# Patient Record
Sex: Male | Born: 1955
Health system: Southern US, Community
[De-identification: ages and names within clinical notes are randomized; demographics above are authoritative.]

## PROBLEM LIST (undated history)

## (undated) DIAGNOSIS — E785 Hyperlipidemia, unspecified: Secondary | ICD-10-CM

## (undated) DIAGNOSIS — K219 Gastro-esophageal reflux disease without esophagitis: Secondary | ICD-10-CM

## (undated) DIAGNOSIS — G4733 Obstructive sleep apnea (adult) (pediatric): Secondary | ICD-10-CM

## (undated) DIAGNOSIS — I10 Essential (primary) hypertension: Secondary | ICD-10-CM

## (undated) DIAGNOSIS — I2510 Atherosclerotic heart disease of native coronary artery without angina pectoris: Secondary | ICD-10-CM

## (undated) DIAGNOSIS — E669 Obesity, unspecified: Secondary | ICD-10-CM

## (undated) DIAGNOSIS — E1169 Type 2 diabetes mellitus with other specified complication: Secondary | ICD-10-CM

## (undated) DIAGNOSIS — M1990 Unspecified osteoarthritis, unspecified site: Secondary | ICD-10-CM

## (undated) DIAGNOSIS — Z9989 Dependence on other enabling machines and devices: Secondary | ICD-10-CM

## (undated) DIAGNOSIS — E119 Type 2 diabetes mellitus without complications: Secondary | ICD-10-CM

## (undated) HISTORY — DX: Essential (primary) hypertension: I10

## (undated) HISTORY — DX: Morbid (severe) obesity due to excess calories: E66.01

## (undated) HISTORY — DX: Atherosclerotic heart disease of native coronary artery without angina pectoris: I25.10

## (undated) HISTORY — DX: Hyperlipidemia, unspecified: E78.5

## (undated) HISTORY — PX: TONSILLECTOMY: SUR1361

## (undated) HISTORY — DX: Type 2 diabetes mellitus without complications: E11.9

## (undated) HISTORY — DX: Obesity, unspecified: E11.69

## (undated) HISTORY — PX: UVULOPALATOPHARYNGOPLASTY: SHX827

## (undated) HISTORY — PX: KNEE ARTHROSCOPY: SHX127

## (undated) HISTORY — PX: JOINT REPLACEMENT: SHX530

## (undated) HISTORY — DX: Obesity, unspecified: E66.9

---

## 1995-04-15 HISTORY — PX: CORONARY ANGIOPLASTY WITH STENT PLACEMENT: SHX49

## 2002-08-15 ENCOUNTER — Encounter: Payer: Self-pay | Admitting: Gastroenterology

## 2002-08-15 ENCOUNTER — Encounter: Admission: RE | Admit: 2002-08-15 | Discharge: 2002-08-15 | Payer: Self-pay | Admitting: Gastroenterology

## 2002-09-13 ENCOUNTER — Encounter: Payer: Self-pay | Admitting: Vascular Surgery

## 2002-09-13 ENCOUNTER — Encounter: Admission: RE | Admit: 2002-09-13 | Discharge: 2002-09-13 | Payer: Self-pay | Admitting: Vascular Surgery

## 2003-07-25 ENCOUNTER — Encounter: Admission: RE | Admit: 2003-07-25 | Discharge: 2003-09-04 | Payer: Self-pay | Admitting: Neurology

## 2006-04-14 HISTORY — PX: CARDIAC CATHETERIZATION: SHX172

## 2007-03-03 ENCOUNTER — Observation Stay (HOSPITAL_COMMUNITY): Admission: AD | Admit: 2007-03-03 | Discharge: 2007-03-05 | Payer: Self-pay | Admitting: Interventional Cardiology

## 2007-03-18 ENCOUNTER — Encounter (HOSPITAL_COMMUNITY): Admission: RE | Admit: 2007-03-18 | Discharge: 2007-04-14 | Payer: Self-pay | Admitting: Interventional Cardiology

## 2007-04-15 ENCOUNTER — Encounter (HOSPITAL_COMMUNITY): Admission: RE | Admit: 2007-04-15 | Discharge: 2007-07-14 | Payer: Self-pay | Admitting: Interventional Cardiology

## 2008-08-16 ENCOUNTER — Encounter: Payer: Self-pay | Admitting: Cardiovascular Disease

## 2008-08-16 ENCOUNTER — Ambulatory Visit: Payer: Self-pay

## 2010-08-27 NOTE — Discharge Summary (Signed)
[name], [name]                ACCOUNT NO.:  [id number]   MEDICAL RECORD NO.:  [id number]          PATIENT TYPE:  INP   LOCATION:  6525                         FACILITY:  [facility]   PHYSICIAN:  Lyn Records, M.D.   DATE OF BIRTH:  [birth year]   DATE OF ADMISSION:  03/03/2007  DATE OF DISCHARGE:  03/05/2007                               DISCHARGE SUMMARY   DISCHARGE DIAGNOSES:  1. Class III angina, resolved.  2. Coronary artery disease, status cardiac catheterization as      described below.  3. Long-term medication use.  4. Hyperlipidemia.  5. Hypertension.  6. Gastroesophageal reflux disease.   HOSPITAL COURSE:  [name] is a 55 year old male patient who has known  coronary artery disease, underwent stenting to the right coronary artery  in 1997.  Recently, he has complained of burning in the chest,  associated with shortness of breath with exertion.  He was placed on a  treadmill, and within 2 minutes he had chest pain associated with  shortness of breath but no EKG changes.  Due to the early positive  stress test and frequency of pain, he is admitted for cardiac  catheterization.   He underwent cardiac catheterization the following day and was found to  have the following:  Normal LV:  Left main had distal 30% lesion.  LAD had an eccentric 30% ostial with a 40% proximal stenosis.  Circumflex was 40% proximal with a 95% and a small branch to the OM.  Right coronary artery was occluded proximally with left-to-right  collaterals.  Dr. Katrinka Blazing then performed a percutaneous intervention to  the right coronary artery, but was unable to cross.  This time, we are  initiating maximal medical therapy.   On discharge, the patient's creatinine was 0.88, potassium 4.1.  Right  groin was stable.  Vital signs were stable.   DISCHARGE MEDICATIONS:  1. Enteric-coated aspirin 325 mg daily.  2. Lipitor 80 mg a day.  3. Toprol XL 100 mg a day.  4. Welchol 3 tablets daily.  5. Nexium 40  mg a day.  6. Lyrica 75 mg a day.  7. Triamterine/HCTZ  37.5 mg daily.  8. Voltaren 75 mg daily.  9. Imdur 60 mg a day.  10.Sublingual nitroglycerin p.r.n. chest pain.   DISCHARGE INSTRUCTIONS:  Remain on low-sodium heart-healthy diet.  No  lifting over 10 pounds for 1 week.  No driving for 2 days.  Clean cath  site gently with soap and water.  No scrubbing.  Follow up with the  nurse practitioner, Tammy Lewis/Dr. Katrinka Blazing on March 18, 2007 at 2:30  p.m.      Guy Franco, P.A.      Lyn Records, M.D.  Electronically Signed    LB/MEDQ  D:  03/05/2007  T:  03/05/2007  Job:  [id number]

## 2010-08-27 NOTE — Cardiovascular Report (Signed)
NAMEEMBER, GOTTWALD NO.:  [id number]   MEDICAL RECORD NO.:  [id number]          PATIENT TYPE:  INP   LOCATION:  2807                         FACILITY:  [facility]   PHYSICIAN:  Lyn Records, M.D.   DATE OF BIRTH:  [birth year]   DATE OF PROCEDURE:  03/04/2007  DATE OF DISCHARGE:                            CARDIAC CATHETERIZATION   PRIMARY CARE PHYSICIAN:  Tasia Catchings, MD   INDICATION:  Class III angina in this patient with a prior history of  right coronary artery stent greater than 10 years ago.  Recent early  positive exercise treadmill test on March 03, 2007.   PROCEDURE PERFORMED:  1. Left heart cath.  2. Selective coronary angiogram.  3. Left ventriculorrhaphy.  4. PCI of the totally occluded right coronary.   DESCRIPTION:  After informed consent, the patient was brought to the  cardiac catheterization laboratory.  A 6-French sheath was placed in the  right femoral artery using the modified Seldinger technique.  A 6-French  A2 multipurpose catheter was used for hemodynamic recordings, left  ventriculorrhaphy by HAM injection, and selective coronary angiography.  The new significant finding was total occlusion of the right coronary in  the mid segment that had been previously stented.  There was significant  left-to-right collaterals.  The right coronary is a very large dominant  vessel.   After reviewing the digital angiogram we placed, it was felt that an  attempted recanalization of the right coronary with percutaneous  technique was warranted.  We gave the patient an Angiomax bolus followed  by an infusion.  ACT was documented to be greater than 300.  We then  used a 15-mm long over the wire Maverick balloon and loaded an  intermediate ASAHI wire within this balloon catheter.  We advanced the  catheter into the guide system and then the wire into the right coronary  stump.  We then advanced the balloon catheter over the wire into the  proximal  RCA.  From this vantage point, we used this wire to attempt to  cross total occlusion.  We were able to penetrate half way through the  obstruction and spent 30-40 minutes attempting to get through to the  distal vessel, but were unsuccessful.  In the mid-vessel, we would  selectively engage small side branches that would take [place] off course.  After 30-35 minutes of attempting to cross with the wire, we terminated  the case.  Hemostasis was achieved with manual compression.   RESULTS:  1. Hemodynamic data:      a.     Aortic pressure 112/70.      b.     Left ventricular pressure 115/9.  2. Left ventriculorrhaphy:  The Left ventricular cavity size and      function are normal.  EF was 70%.  No mitral regurgitation is      noted.  3. Coronary angiography.      a.     Left main coronary:  The distal left main contains less than       25% stenosis.      b.  Left anterior descending coronary:  The LAD is a large       vessel that reaches the left ventricular apex and gives origin to       a large diagonal.  There is eccentric 20% to 30% osteo LAD       narrowing, segmental 30% to 40% narrowing in the proximal LAD.  No       high-grade obstruction is present.      c.     Circumflex artery:  Circumflex artery gives origin to a       large branching first obtuse marginal that arises after the first       2-3 mm of the circumflex.  There is eccentric 30% to 40% narrowing       before the first obtuse marginal.  Circumflex beyond the first       obtuse marginal was small and the continuation of the circumflex       after the marginal contains an 80% to 90% obstruction, and the       obtuse marginal beyond this is a small branching vessel less than       1.5 mm in diameter.      d.     Right coronary:  The right coronary artery is totally       occluded in the mid vessel.  This was within the previously       stented segment.  The distal right coronary is filled by LAD and       circumflex,  collaterals to the distal right coronary and the PDA.       There is a retrograde flow of contrast into the mid right coronary       noted on long cine run.  4. PCI:  We were unable to cross the total occlusion in the mid RCA      restenotic stent.  A 30-40 minutes was spent attempting to cross      into the distal RCA without success.   CONCLUSIONS:  1. Severe coronary artery disease with total occlusion of the right      coronary artery and left-to-right collaterals.  There is      significant stenosis and small continuation of the circumflex      beyond the first obtuse marginal.  The first obtuse marginal, LAD,      and left main are patent.  2. Normal left ventricular function.   PLAN:  Aggressively titrate medications and attempt to control the  patient's symptoms with medical therapy.  If this fails, consideration  of repeat attempted PCI or coronary artery bypass grafting should be  entertained.      Lyn Records, M.D.  Electronically Signed     HWS/MEDQ  D:  03/04/2007  T:  03/04/2007  Job:  [id number]   cc:   Tasia Catchings, M.D.

## 2010-08-27 NOTE — H&P (Signed)
NAMEDANIELA, HERNAN NO.:  [id number]   MEDICAL RECORD NO.:  [id number]          PATIENT TYPE:  INP   LOCATION:  2009                         FACILITY:  [facility]   PHYSICIAN:  Lyn Records, M.D.   DATE OF BIRTH:  1955-10-24   DATE OF ADMISSION:  03/03/2007  DATE OF DISCHARGE:                              HISTORY & PHYSICAL   ADMIT HISTORY AND PHYSICAL   CHIEF COMPLAINT:  Chest pain.   HISTORY OF PRESENT ILLNESS:  [name] is a pleasant 55 year old  white male patient of Dr. Verdis Prime, with known coronary artery  disease, status post stenting of the RCA in June of 1997.  On a recent  visit to the office, the patient complained of burning in the chest with  associated dyspnea.  The pain was exertionally related, does not  radiate, and resolves with rest.  His symptoms prompted a stress  Cardiolite on the day of admission.  He developed within 2 minutes on  the treadmill the same chest discomfort with associated dyspnea.  There  were no EKG changes.  His pain resolved with rest.  In talking with him  further today, he states that this exertional discomfort has been worse  in the last week, and occurs just about every time he exerts himself.  He has had no palpitations or lightheadedness.  Also, he has had no  sweats or nausea.  Due to the frequency of discomfort and the early  positive stress test, he is being admitted for cardiac catheterization  in the morning.   REVIEW OF SYSTEMS:  GENERAL:  No recent weight loss or gain.  No recent  upper respiratory infection.  RESPIRATORY:  No cough, sputum production,  hemoptysis or hoarseness.  CARDIOVASCULAR:  As above.  Denies any  pleuritic nature of the discomfort.  ABDOMEN:  No dysphagia, heartburn,  abdominal pain, nausea, vomiting or diarrhea.  Denies constipation,  melena, hematochezia or hemorrhoids.  GU:  No dysuria, frequency,  hematuria, nocturia.  HEMATOLOGIC:  No bleeding or bruising tendency or  history, frequent anemia.  MUSCULOSKELETAL:  Denies joint pain,  stiffness, swelling or deformity.  NEUROLOGIC:  No numbness, tingling,  seizures, memory loss, vertigo or tremors.   PAST MEDICAL HISTORY:  1. ASHD, status post stenting of RCA, June 1997.  2. Hypercholesterolemia.  3. Hypertension.  4. GERD.  5. Sleep apnea.  6. Obesity.  7. Peripheral neuropathy.  8. Strong family history of heart disease.   PAST SURGICAL HISTORY:  1. Uvulectomy.  2. Left knee meniscal surgery.   CURRENT MEDICATIONS:  1. Toprol-XL 50 mg daily.  2. Lipitor 80 mg daily.  3. Aspirin 325 mg daily.  4. Welchol 3 tablets h.s. daily.  5. Triamterene 75 a half a tablet daily.  6. Lyrica 75 mg 2 daily.  7. Multivitamin daily.  8. CPAP at bedtime daily.  9. Omeprazole 20 mg daily.  10.Nitroglycerin 0.4 mg sublingual p.r.n.   DRUG ALLERGIES:  PENICILLIN.   SOCIAL HISTORY:  Married.  Wife has MS.  The patient does not smoke.  No  alcohol.  No  illicit drug use.   FAMILY HISTORY:  Father died age 55 with CHF.  Also had coronary artery  disease and his first MI at age 64.  Mother is alive at age 33 with COPD  on O2.  He has no siblings.   PHYSICAL EXAMINATION:  VITAL SIGNS:  Weight 297.2, pulse 88, blood  pressure 132/86.  GENERAL:  Pleasant, no acute distress.  NECK:  No JVD.  No carotid bruits were auscultated.  Carotid upstrokes  2+ bilaterally.  CHEST:  Clear to percussion and auscultation.  HEART:  Normal S1, S2 without murmur, S3, S4, rub, click or gallop.  ABDOMEN:  Obese.  Bowel sounds are present in all four quadrants.  I  hear no bruits.  He is nontender without organomegaly or masses.  EXTREMITIES:  +2 femoral pulses bilaterally.  Distal pulses +2 and  intact.  Mild deformity in the left knee and chronic edema of the right  ankle.  Unchanged from prior.  GU/RECTAL:  Deferred.  NEUROLOGIC:  Cranial nerves II-XII intact.  Gait steady.  Speech  appropriate.   EKG on February 23, 2007  was normal with no ST changes.   IMPRESSION:  1. Exertional chest pain with early positive stress test.  2. History of coronary artery disease as above.  3. Hypertension.  4. Hypercholesterolemia.  5. Reflux.  6. Sleep apnea.   PLAN:  Admit.  IV heparin.  IV nitroglycerin for pain.  Continue beta  blockers and other medications as is, n.p.o. after midnight for cardiac  catheterization in the morning.      Tamera C. Lewis, N.P.      Lyn Records, M.D.  Electronically Signed    TCL/MEDQ  D:  03/03/2007  T:  03/03/2007  Job:  [id number]

## 2011-01-21 LAB — CK TOTAL AND CKMB (NOT AT ARMC)
CK, MB: 1.6
CK, MB: 1.7
Relative Index: 1.6
Total CK: 120

## 2011-01-21 LAB — LIPID PANEL
LDL Cholesterol: 61
Total CHOL/HDL Ratio: 5.5
VLDL: 74 — ABNORMAL HIGH

## 2011-01-21 LAB — CBC
HCT: 39.3
HCT: 43.5
Hemoglobin: 13.6
Hemoglobin: 14.7
Hemoglobin: 14.8
Hemoglobin: 14.8
MCHC: 33.9
MCHC: 34.1
MCV: 84.3
Platelets: 195
RBC: 4.67
RBC: 5.04
RBC: 5.08
RBC: 5.09
RDW: 15
RDW: 15
WBC: 6.9
WBC: 6.9
WBC: 7.2

## 2011-01-21 LAB — BASIC METABOLIC PANEL
BUN: 9
CO2: 27
Calcium: 8.9
Chloride: 102
Chloride: 104
GFR calc Af Amer: >60
GFR calc Af Amer: >60
GFR calc non Af Amer: >60
Potassium: 4.1
Sodium: 137
Sodium: 142

## 2011-01-21 LAB — TSH: TSH: 2.123

## 2011-01-21 LAB — COMPREHENSIVE METABOLIC PANEL
Albumin: 3.6
Alkaline Phosphatase: 74
BUN: 13
Potassium: 3.5
Total Protein: 6.8

## 2011-01-21 LAB — TROPONIN I
Troponin I: 0.01
Troponin I: 0.02

## 2011-01-21 LAB — HEPARIN LEVEL (UNFRACTIONATED): Heparin Unfractionated: 0.34

## 2011-01-21 LAB — PROTIME-INR
INR: 1
Prothrombin Time: 13

## 2011-01-21 LAB — MAGNESIUM: Magnesium: 1.8

## 2013-01-24 ENCOUNTER — Other Ambulatory Visit: Payer: Self-pay | Admitting: *Deleted

## 2013-01-24 DIAGNOSIS — M79673 Pain in unspecified foot: Secondary | ICD-10-CM

## 2013-01-24 MED ORDER — CELECOXIB 200 MG PO CAPS: 200.0000 mg | ORAL_CAPSULE | ORAL | Status: DC

## 2013-02-22 ENCOUNTER — Ambulatory Visit (INDEPENDENT_AMBULATORY_CARE_PROVIDER_SITE_OTHER): Payer: Medicare Other

## 2013-02-22 VITALS — BP 138/73 | HR 65 | Resp 12 | Ht 68.0 in | Wt 295.0 lb

## 2013-02-22 DIAGNOSIS — M79609 Pain in unspecified limb: Secondary | ICD-10-CM

## 2013-02-22 DIAGNOSIS — B351 Tinea unguium: Secondary | ICD-10-CM

## 2013-02-22 DIAGNOSIS — E1149 Type 2 diabetes mellitus with other diabetic neurological complication: Secondary | ICD-10-CM

## 2013-02-22 DIAGNOSIS — E1142 Type 2 diabetes mellitus with diabetic polyneuropathy: Secondary | ICD-10-CM

## 2013-02-22 DIAGNOSIS — E1140 Type 2 diabetes mellitus with diabetic neuropathy, unspecified: Secondary | ICD-10-CM

## 2013-02-22 NOTE — Patient Instructions (Signed)
Onychomycosis/Fungal Toenails  WHAT IS IT? An infection that lies within the keratin of your nail plate that is caused by a fungus.  WHY ME? Fungal infections affect all ages, sexes, races, and creeds.  There may be many factors that predispose you to a fungal infection such as age, coexisting medical conditions such as diabetes, or an autoimmune disease; stress, medications, fatigue, genetics, etc.  Bottom line: fungus thrives in a warm, moist environment and your shoes offer such a location.  IS IT CONTAGIOUS? Theoretically, yes.  You do not want to share shoes, nail clippers or files with someone who has fungal toenails.  Walking around barefoot in the same room or sleeping in the same bed is unlikely to transfer the organism.  It is important to realize, however, that fungus can spread easily from one nail to the next on the same foot.  HOW DO WE TREAT THIS?  There are several ways to treat this condition.  Treatment may depend on many factors such as age, medications, pregnancy, liver and kidney conditions, etc.  It is best to ask your doctor which options are available to you.  1. No treatment.   Unlike many other medical concerns, you can live with this condition.  However for many people this can be a painful condition and may lead to ingrown toenails or a bacterial infection.  It is recommended that you keep the nails cut short to help reduce the amount of fungal nail. 2. Topical treatment.  These range from herbal remedies to prescription strength nail lacquers.  About 40-50% effective, topicals require twice daily application for approximately 9 to 12 months or until an entirely new nail has grown out.  The most effective topicals are medical grade medications available through physicians offices. 3. Oral antifungal medications.  With an 80-90% cure rate, the most common oral medication requires 3 to 4 months of therapy and stays in your system for a year as the new nail grows out.  Oral  antifungal medications do require blood work to make sure it is a safe drug for you.  A liver function panel will be performed prior to starting the medication and after the first month of treatment.  It is important to have the blood work performed to avoid any harmful side effects.  In general, this medication safe but blood work is required. 4. Laser Therapy.  This treatment is performed by applying a specialized laser to the affected nail plate.  This therapy is noninvasive, fast, and non-painful.  It is not covered by insurance and is therefore, out of pocket.  The results have been very good with a 80-95% cure rate.  The [facility] is the only practice in the area to offer this therapy. 5. Permanent Nail Avulsion.  Removing the entire nail so that a new nail will not grow back.  Apply topical formula 3 to affected nails twice daily for a 12 months duration

## 2013-02-22 NOTE — Progress Notes (Signed)
  Subjective:    Patient ID: [name], male    DOB: [birth year], 56 y.o.   MRN: [id number]  HPI Comments: '' TOENAILS TRIM''  patient presents this time for diabetic foot and nail care in followup for neuropathy as well as peripheral edema    Review of Systems  Neurological: Positive for numbness.  All other systems reviewed and are negative.       Objective:   Physical Exam Vascular status unchanged pedal pulses DP postal for PT one over 4+ one +2 edema still present wearing compression stocking as instructed patient is thick brittle friable criptotic nails with brittleness discoloration tenderness on palpation with enclosed shoe wear no other new changes medication her health history noted patient did request some other treatment for the nails in particular second third toenails left show severe great toes discoloration and some distal clavus formation and fissuring of the nailbed. At this time I recommendations for use a topical formula 3. May further discuss other options including topical laser oral and oral medications in the future       Assessment & Plan:  Assessment this time his diabetes with peripheral neuropathy and some peripheral edema you've prescription for compression stockings is dispensed for [facility]. At this time patient also debridement of nails 1 through 5 bilateral the presence of onychomycosis painful mycotic nails 1 through 5 are debrided both feet suggested topical formula 3 dispensed formula 3 applied twice daily to the affected nails as instructed for [name] 12 months duration reappointed 3 months for continued palliative care as instructed  Alvan Dame DPM

## 2013-03-17 ENCOUNTER — Telehealth: Payer: Self-pay | Admitting: Interventional Cardiology

## 2013-03-17 NOTE — Telephone Encounter (Signed)
Walk In pt Form " [employer]" Insurance papers Dropped Off will give to [facility] Friday

## 2013-04-05 ENCOUNTER — Telehealth: Payer: Self-pay

## 2013-04-05 NOTE — Telephone Encounter (Signed)
pt insurance form completed by Dr.Macon.pt request completed form be mailed to him. done

## 2013-05-20 ENCOUNTER — Ambulatory Visit (INDEPENDENT_AMBULATORY_CARE_PROVIDER_SITE_OTHER): Payer: Medicare Other

## 2013-05-20 VITALS — BP 120/62 | HR 60 | Resp 12

## 2013-05-20 DIAGNOSIS — M79609 Pain in unspecified limb: Secondary | ICD-10-CM

## 2013-05-20 DIAGNOSIS — B351 Tinea unguium: Secondary | ICD-10-CM

## 2013-05-20 NOTE — Progress Notes (Signed)
   Subjective:    Patient ID: [name], male    DOB: 11/30/1955, 58 y.o.   MRN: [id number] HPI ''toenails trim.''    Review of Systems noted changes or findings only significant finding patient wearing compression stocking on right leg due to chronic lymphedema for 10 years continues to be well-managed.     Objective:   Physical Exam Vascular status is intact pedal pulses palpable DP +2/4 PT one over 4 bilateral +2 edema of the right side wear compression stocking. Neurologically skin color pigment normal hair growth absent nails thick brittle crumbly friable discolored and tender on palpation and inflow shoewear 1 through 5 bilateral. Orthopedic biomechanical exam otherwise unremarkable noncontributory rectus foot type mild digital contractures noted semirigid. Again no open wounds ulcerations no secondary infection is noted       Assessment & Plan:  Assessment this time is onychomycosis dystrophy discoloration and friability of nails 1 through 5 bilateral. Nails debrided x10 return for future palliative care in 3 months or as-needed basis  Alvan Dameichard Jered Heiny DPM

## 2013-05-20 NOTE — Patient Instructions (Addendum)

## 2013-05-23 ENCOUNTER — Other Ambulatory Visit: Payer: Self-pay

## 2013-05-24 ENCOUNTER — Other Ambulatory Visit: Payer: Self-pay

## 2013-05-25 ENCOUNTER — Telehealth: Payer: Self-pay | Admitting: *Deleted

## 2013-05-25 MED ORDER — PREGABALIN 150 MG PO CAPS: 150.0000 mg | ORAL_CAPSULE | Freq: Two times a day (BID) | ORAL | Status: DC

## 2013-05-25 NOTE — Telephone Encounter (Addendum)
Pt  Requested refill of Lyrica 150mg .  Dr Jake SeatsSikora okayed plus 3 refills.  [facility] [address] [phone] at 512pm.

## 2013-06-13 ENCOUNTER — Other Ambulatory Visit: Payer: Self-pay

## 2013-07-12 ENCOUNTER — Other Ambulatory Visit: Payer: Self-pay

## 2013-07-22 ENCOUNTER — Other Ambulatory Visit: Payer: Self-pay | Admitting: Interventional Cardiology

## 2013-07-26 ENCOUNTER — Encounter: Payer: Self-pay | Admitting: Interventional Cardiology

## 2013-07-26 ENCOUNTER — Ambulatory Visit (INDEPENDENT_AMBULATORY_CARE_PROVIDER_SITE_OTHER): Payer: 59 | Admitting: Interventional Cardiology

## 2013-07-26 VITALS — BP 129/59 | HR 57 | Ht 68.0 in | Wt 297.0 lb

## 2013-07-26 DIAGNOSIS — I2510 Atherosclerotic heart disease of native coronary artery without angina pectoris: Secondary | ICD-10-CM | POA: Insufficient documentation

## 2013-07-26 DIAGNOSIS — I10 Essential (primary) hypertension: Secondary | ICD-10-CM

## 2013-07-26 DIAGNOSIS — G4733 Obstructive sleep apnea (adult) (pediatric): Secondary | ICD-10-CM | POA: Insufficient documentation

## 2013-07-26 DIAGNOSIS — E785 Hyperlipidemia, unspecified: Secondary | ICD-10-CM | POA: Insufficient documentation

## 2013-07-26 NOTE — Progress Notes (Signed)
Patient ID: Levi BarthDennis K Sipos, male   DOB: 24-Nov-1955, 58 y.o.   MRN: [id number]    [address] [place]  [address] Phone: [phone]) [phone] Fax:  [phone]  Date:  07/26/2013   ID:  Levi BarthDennis K Ferraris, DOB 24-Nov-1955, MRN [id number]  PCP:  Ginette OttoSTONEKING,HAL THOMAS, MD   ASSESSMENT:  1. Coronary artery disease, stable with angina, class II 2. Hypertension, controlled 3. Hyperlipidemia with excellent LDL when last seen by Dr. Pete GlatterStoneking of 79 3. Morbid obesity 4. Obstructive sleep apnea  PLAN:  1. Weight loss encouraged via diet, aerobic exercise, and decreased caloric intake 2. No change in therapy for coronary disease 3. Low salt diet 4. Continue medications as listed   SUBJECTIVE: [name] is a 58 y.o. male who has angina 2-3 times per week. This pattern is unchanged. It is almost always exertional. His been this way since around 2008 when catheterization revealed a chronically occluded right coronary dependent on the left to right collaterals. He does not have angina at rest. He does have erectile dysfunction but is unable to use PDE 5 inhibitor therapy because of long-acting nitrate therapy. He has not had syncope, prolonged palpitations, or transient neurological symptoms.   Wt Readings from Last 3 Encounters:  07/26/13 297 lb (134.718 kg)  02/22/13 295 lb (133.811 kg)     No past medical history on file.  Current Outpatient Prescriptions  Medication Sig Dispense Refill  . aspirin 325 MG tablet Take 325 mg by mouth daily.      Marland Kitchen. atorvastatin (LIPITOR) 80 MG tablet Take 1 tab daily      . celecoxib (CELEBREX) 200 MG capsule TAKE 1 CAPSULE BY MOUTH EVERY DAY  30 capsule  0  . Cholecalciferol (VITAMIN D-3 PO) Take by mouth. 2000iu 1 tab daily      . Glucosamine-Chondroitin (COSAMIN DS PO) Take by mouth. Take 2 tabs daily      . isosorbide mononitrate (IMDUR) 120 MG 24 hr tablet daily      . lisinopril (PRINIVIL,ZESTRIL) 5 MG tablet Take 1 tab daily      .  metoprolol (LOPRESSOR) 50 MG tablet TAKE 1 TABLET BY MOUTH TWICE DAILY  180 tablet  0  . Misc Natural Products (WHITE WILLOW BARK PO) Take by mouth. 400mg  1 tab daily      . Multiple Vitamin (MULTIVITAMIN) tablet Take 1 tablet by mouth daily.      . multivitamin (METANX) 3-35-2 MG TABS tablet Take one tablet by mouth two times daily  180 tablet  3  . NITROSTAT 0.4 MG SL tablet       . omeprazole (PRILOSEC) 20 MG capsule Take 20 mg by mouth daily.       . pregabalin (LYRICA) 150 MG capsule Take 1 capsule (150 mg total) by mouth 2 (two) times daily.  60 capsule  3  . triamterene-hydrochlorothiazide (MAXZIDE) 75-50 MG per tablet Take 1/2 tab a day      . WELCHOL 625 MG tablet Take 3 tabs at bedtime       No current facility-administered medications for this visit.    Allergies:    Allergies  Allergen Reactions  . Penicillins     Social History:  The patient  reports that he has never smoked. He does not have any smokeless tobacco history on file. He reports that he does not drink alcohol or use illicit drugs.   ROS:  Please see the history of present illness.  No transient neurological events. Denies edema. No palpitations.   All other systems reviewed and negative.   OBJECTIVE: VS:  BP 129/59  Pulse 57  Ht 5\' 8"  (1.727 m)  Wt 297 lb (134.718 kg)  BMI 45.17 kg/m2 Well nourished, well developed, in no acute distress, obese, morbidly HEENT: normal Neck: JVD flat. Carotid bruit absent  Cardiac:  normal S1, S2; RRR; no murmur Lungs:  clear to auscultation bilaterally, no wheezing, rhonchi or rales Abd: soft, nontender, no hepatomegaly Ext: Edema trace bilateral. Pulses 2+ and symmetric Skin: warm and dry Neuro:  CNs 2-12 intact, no focal abnormalities noted  EKG:  Normal with sinus bradycardia noted       Signed, Darci NeedleHenry W. B. Ceesay III, MD 07/26/2013 9:57 AM

## 2013-07-26 NOTE — Patient Instructions (Signed)
Your physician recommends that you continue on your current medications as directed. Please refer to the Current Medication list given to you today.  Your physician wants you to follow-up in: 1 year. You will receive a reminder letter in the mail two months in advance. If you don't receive a letter, please call our office to schedule the follow-up appointment.  

## 2013-08-02 ENCOUNTER — Ambulatory Visit (INDEPENDENT_AMBULATORY_CARE_PROVIDER_SITE_OTHER): Payer: Medicare Other

## 2013-08-02 VITALS — BP 109/69 | HR 65 | Resp 17 | Ht 68.0 in | Wt 295.0 lb

## 2013-08-02 DIAGNOSIS — M7750 Other enthesopathy of unspecified foot: Secondary | ICD-10-CM

## 2013-08-02 DIAGNOSIS — M79609 Pain in unspecified limb: Secondary | ICD-10-CM

## 2013-08-02 DIAGNOSIS — M722 Plantar fascial fibromatosis: Secondary | ICD-10-CM

## 2013-08-02 DIAGNOSIS — B351 Tinea unguium: Secondary | ICD-10-CM

## 2013-08-02 NOTE — Patient Instructions (Signed)

## 2013-08-02 NOTE — Progress Notes (Signed)
   Subjective:    Patient ID: [name], male    DOB: 01-20-1956, 58 y.o.   MRN: [id number]  HPI Comments: Pt presents for debridement of B/L 1 - 5 toenails and diabetic foot evaluation.     Review of Systems no new significant systemic changes or findings noted     Objective:   Physical Exam 89-year-old white male well-developed well-nourished returns 3 presents at this time for followup mycotic nail care as well as for new orthotics patient is in a 10 year history of lymphedema affecting his right leg wear compression stockings also has peripheral neuropathy etiopathic affecting both lower extremities. Objective findings otherwise vascular status as pedal pulses palpable DP +2/4 bilateral PT one over 4 bilateral +2 edema the right side left side is unremarkable neurologic epicritic and proprioceptive sensations. He intact although diminished on Semmes Weinstein testing digits plantar forefoot arch orthopedic biomechanical exam rectus foot type unremarkable no signs of abnormalities mild flexible digital contractures noted no open wounds or ulcerations are noted no secondary infection is noted nails thick brittle crumbly friable with discoloration and tenderness both on palpation with enclosed shoe wear. Patient also has a history of plantar fascial symptomology capsulitis a Lisfranc's and mid tarsus for which is the orthoses Dimas AguasHoward is orthotics are wearing and needs replacement orthoses at this time requesting the orthotic scan       Assessment & Plan:  Assessment this time is onychomycosis painful mycotic nails debrided 1 through 4 bilateral continue with topical antifungal as needed return in 3 months for continued palliative care in the future as needed also orthotics skin carried at this time for new functional orthoses with Spenco Plastizote top cover her Spenco PPT top cover and patient will have extrinsic posting 2 varus polypropylene type some flexible device to patient does  have/plantar fascial symptomology which benefit from functional orthoses to be continued reappointed in 3 months for continued palliative care we'll contact patient next 2-3 weeks when orthotics ready for fitting and dispensing next progress [name] DPM

## 2013-09-08 ENCOUNTER — Other Ambulatory Visit: Payer: Self-pay | Admitting: Interventional Cardiology

## 2013-09-15 ENCOUNTER — Other Ambulatory Visit: Payer: Self-pay

## 2013-09-16 ENCOUNTER — Other Ambulatory Visit: Payer: Self-pay

## 2013-09-19 NOTE — Telephone Encounter (Signed)
Entered in error

## 2013-09-20 ENCOUNTER — Other Ambulatory Visit: Payer: Self-pay

## 2013-10-17 ENCOUNTER — Other Ambulatory Visit: Payer: Self-pay

## 2013-10-18 ENCOUNTER — Other Ambulatory Visit: Payer: Self-pay | Admitting: *Deleted

## 2013-10-18 MED ORDER — PREGABALIN 150 MG PO CAPS: 150.0000 mg | ORAL_CAPSULE | Freq: Two times a day (BID) | ORAL | Status: DC

## 2013-10-18 NOTE — Telephone Encounter (Signed)
Refill for Lyrica 150mg  okayed by Dr. Ralene CorkSikora with 5 additional refills.

## 2013-10-19 ENCOUNTER — Other Ambulatory Visit: Payer: Self-pay | Admitting: Interventional Cardiology

## 2013-10-25 ENCOUNTER — Ambulatory Visit (INDEPENDENT_AMBULATORY_CARE_PROVIDER_SITE_OTHER): Payer: Medicare Other

## 2013-10-25 DIAGNOSIS — G609 Hereditary and idiopathic neuropathy, unspecified: Secondary | ICD-10-CM

## 2013-10-25 DIAGNOSIS — M79606 Pain in leg, unspecified: Secondary | ICD-10-CM

## 2013-10-25 DIAGNOSIS — B351 Tinea unguium: Secondary | ICD-10-CM

## 2013-10-25 DIAGNOSIS — M79609 Pain in unspecified limb: Secondary | ICD-10-CM

## 2013-10-25 NOTE — Patient Instructions (Signed)
Diabetes and Foot Care Diabetes may cause you to have problems because of poor blood supply (circulation) to your feet and legs. This may cause the skin on your feet to become thinner, break easier, and heal more slowly. Your skin may become dry, and the skin may peel and crack. You may also have nerve damage in your legs and feet causing decreased feeling in them. You may not notice minor injuries to your feet that could lead to infections or more serious problems. Taking care of your feet is one of the most important things you can do for yourself.  HOME CARE INSTRUCTIONS  Wear shoes at all times, even in the house. Do not go barefoot. Bare feet are easily injured.  Check your feet daily for blisters, cuts, and redness. If you cannot see the bottom of your feet, use a mirror or ask someone for help.  Wash your feet with warm water (do not use hot water) and mild soap. Then pat your feet and the areas between your toes until they are completely dry. Do not soak your feet as this can dry your skin.  Apply a moisturizing lotion or petroleum jelly (that does not contain alcohol and is unscented) to the skin on your feet and to dry, brittle toenails. Do not apply lotion between your toes.  Trim your toenails straight across. Do not dig under them or around the cuticle. File the edges of your nails with an emery board or nail file.  Do not cut corns or calluses or try to remove them with medicine.  Wear clean socks or stockings every day. Make sure they are not too tight. Do not wear knee-high stockings since they may decrease blood flow to your legs.  Wear shoes that fit properly and have enough cushioning. To break in new shoes, wear them for just a few hours a day. This prevents you from injuring your feet. Always look in your shoes before you put them on to be sure there are no objects inside.  Do not cross your legs. This may decrease the blood flow to your feet.  If you find a minor scrape,  cut, or break in the skin on your feet, keep it and the skin around it clean and dry. These areas may be cleansed with mild soap and water. Do not cleanse the area with peroxide, alcohol, or iodine.  When you remove an adhesive bandage, be sure not to damage the skin around it.  If you have a wound, look at it several times a day to make sure it is healing.  Do not use heating pads or hot water bottles. They may burn your skin. If you have lost feeling in your feet or legs, you may not know it is happening until it is too late.  Make sure your health care provider performs a complete foot exam at least annually or more often if you have foot problems. Report any cuts, sores, or bruises to your health care provider immediately. SEEK MEDICAL CARE IF:   You have an injury that is not healing.  You have cuts or breaks in the skin.  You have an ingrown nail.  You notice redness on your legs or feet.  You feel burning or tingling in your legs or feet.  You have pain or cramps in your legs and feet.  Your legs or feet are numb.  Your feet always feel cold. SEEK IMMEDIATE MEDICAL CARE IF:   There is increasing redness,   swelling, or pain in or around a wound.  There is a red line that goes up your leg.  Pus is coming from a wound.  You develop a fever or as directed by your health care provider.  You notice a bad smell coming from an ulcer or wound. Document Released: 03/28/2000 Document Revised: 12/01/2012 Document Reviewed: 09/07/2012 Exit[employer] Patient Information 2015 [employer]. This information is not intended to replace advice given to you by your health care provider. Make sure you discuss any questions you have with your health care provider.  

## 2013-10-25 NOTE — Progress Notes (Signed)
   Subjective:    Patient ID: Levi BarthDennis K Quraishi, male    DOB: 1955/08/31, 58 y.o.   MRN: [id number]  HPI  Pt is here for routine debride of nails, no other issues at the moment   Review of Systems no new findings or systemic changes noted. Patient has a history of lymphedema wear his compression stocking on the right lower leg. Continues to have a peptic neuropathy issues.     Objective:   Physical Exam Lower extremity objective findings as follows vascular status appears to be intact with pedal pulses DP +2/4 PT one over 4 bilateral +2 edema the right epicritic and proprioceptive sensations diminished on Semmes Weinstein testing to forefoot digits and arch area although not diabetic patient does have some dry fissure skin distal tuft third digit left foot is any fissuring bled the other day likely from just dry skin posse associated with neuropathy. Nails thick brittle crumbly friable 1 through 5 bilateral painful and tender both on palpation with enclosed shoe wear. History of plantar fascial symptomology otherwise unremarkable open wounds ulcerations noted current time.       Assessment & Plan:  Assessment this time history peripheral neuropathy idiopathic as well as onychomycosis painful mycotic friable nails 1 through 4 bilateral at this time return for future palliative care and as-needed basis suggest 3 month followup also recommended cushion shoes and insoles a lotion to the skin to prevent dry fissuring and cracking. Recheck in 3 months as needed for followup next  Alvan Dameichard Zaid Tomes DPM

## 2013-11-01 ENCOUNTER — Ambulatory Visit: Payer: Medicare Other

## 2013-11-28 ENCOUNTER — Other Ambulatory Visit: Payer: Self-pay

## 2013-12-05 ENCOUNTER — Other Ambulatory Visit: Payer: Self-pay

## 2014-01-05 ENCOUNTER — Ambulatory Visit (INDEPENDENT_AMBULATORY_CARE_PROVIDER_SITE_OTHER): Payer: Medicare Other

## 2014-01-05 ENCOUNTER — Encounter: Payer: Self-pay | Admitting: Podiatry

## 2014-01-05 ENCOUNTER — Ambulatory Visit (INDEPENDENT_AMBULATORY_CARE_PROVIDER_SITE_OTHER): Payer: Medicare Other | Admitting: Podiatry

## 2014-01-05 VITALS — BP 149/82 | HR 82 | Temp 99.5°F | Resp 15 | Ht 68.0 in | Wt 295.0 lb

## 2014-01-05 DIAGNOSIS — M779 Enthesopathy, unspecified: Secondary | ICD-10-CM

## 2014-01-05 DIAGNOSIS — M25579 Pain in unspecified ankle and joints of unspecified foot: Secondary | ICD-10-CM

## 2014-01-05 DIAGNOSIS — M25572 Pain in left ankle and joints of left foot: Secondary | ICD-10-CM

## 2014-01-05 NOTE — Patient Instructions (Signed)
Monitor for any signs/symptoms of infection. Call the office immediately if any occur or go directly to the emergency room. Call with any questions/concerns.  

## 2014-01-05 NOTE — Progress Notes (Signed)
   Subjective:    Patient ID: [name], male    DOB: 1956-01-15, 58 y.o.   MRN: [id number]  HPI Comments: Mr. Mikelson, 58 year old male, presents to the office today with a 3-4 day history of left ankle pain/swelling/warmth. She denies any history of trauma to the area. He states it onset was gradual and she has noticed progression of his symptoms. He is able to ambulate although with some discomfort. He's had a intermittent chills denies any current fevers, nausea, vomiting.    Review of Systems  Musculoskeletal:       Left ankle pain   All other systems reviewed and are negative.      Objective:   Physical Exam AAO x3, NAD DP pulses palpable 2/4, PT 1/4 b/l. CRT < 3sec Decreased protective sensation with Derrel Nip monofilament. Moderate edema around the left ankle. There is mild increase in warmth around the ankle with mild erythema extending from the medial to lateral malleoli and anterior ankle. Patient is able to passively move ankle in dorsiflexion plantarflexion although with some discomfort. Mild diffuse tenderness over the ankle but no specific pinpoint bony tenderness. No calf pain, swelling, warmth. There is no open lesions identified, no fluctuance no crepitus. No ascending cellulitis.  Right lower extremity with mild chronic edema. No open lesions. Decrease in medial arch height upon weightbearing. Equinus bilaterally.      Assessment & Plan:  58 year old male with left ankle pain possibly from gout versus infection versus other inflammatory  condition. -X-rays were obtained of the left foot and ankle. See x-ray report for full details. -Conservative versus surgical treatment were discussed including alternatives, risks, complications. -At this time recommended immobilization in a cam boot until definitive diagnosis is also just immobilize it due to the pain and swelling. -Will obtain blood work including CBC, ESR, CRP, ANA, rheumatoid factor, uric acid  level. -Will call patient with the results once obtained. -Followup in 1 week or sooner if any problems are to arise or any change in symptoms. If he has any increasing pain, swelling, warmth or any other systemic complaints he is to go directly to the emergency room call the office.

## 2014-01-06 ENCOUNTER — Telehealth: Payer: Self-pay | Admitting: Podiatry

## 2014-01-06 ENCOUNTER — Other Ambulatory Visit: Payer: Self-pay | Admitting: Podiatry

## 2014-01-06 ENCOUNTER — Ambulatory Visit: Payer: Medicare Other | Admitting: Podiatry

## 2014-01-06 LAB — C-REACTIVE PROTEIN: CRP: 2.2 mg/dL — ABNORMAL HIGH (ref <0.60)

## 2014-01-06 LAB — URIC ACID: URIC ACID, SERUM: 8.3 mg/dL — AB (ref 4.0–7.8)

## 2014-01-06 LAB — CBC WITH DIFFERENTIAL/PLATELET
BASOS ABS: 0 10*3/uL (ref 0.0–0.1)
Basophils Relative: 0 % (ref 0–1)
Eosinophils Absolute: 0.1 10*3/uL (ref 0.0–0.7)
Eosinophils Relative: 2 % (ref 0–5)
HCT: 42.6 % (ref 39.0–52.0)
Hemoglobin: 14.8 g/dL (ref 13.0–17.0)
LYMPHS ABS: 1.9 10*3/uL (ref 0.7–4.0)
LYMPHS PCT: 26 % (ref 12–46)
MCH: 29.3 pg (ref 26.0–34.0)
MCHC: 34.7 g/dL (ref 30.0–36.0)
MCV: 84.4 fL (ref 78.0–100.0)
Monocytes Absolute: 0.7 10*3/uL (ref 0.1–1.0)
Monocytes Relative: 9 % (ref 3–12)
NEUTROS ABS: 4.6 10*3/uL (ref 1.7–7.7)
NEUTROS PCT: 63 % (ref 43–77)
PLATELETS: 170 10*3/uL (ref 150–400)
RBC: 5.05 MIL/uL (ref 4.22–5.81)
RDW: 15.6 % — AB (ref 11.5–15.5)
WBC: 7.3 10*3/uL (ref 4.0–10.5)

## 2014-01-06 LAB — SEDIMENTATION RATE: SED RATE: 12 mm/h (ref 0–16)

## 2014-01-06 LAB — ANA: ANA: NEGATIVE

## 2014-01-06 LAB — RHEUMATOID FACTOR: Rhuematoid fact SerPl-aCnc: <10 IU/mL (ref <=14)

## 2014-01-06 MED ORDER — METHYLPREDNISOLONE (PAK) 4 MG PO TABS: ORAL_TABLET | ORAL | Status: DC

## 2014-01-06 NOTE — Telephone Encounter (Signed)
Palpation to discuss lab results. The results show gout.  I discussed medications of the pharmacy. Prescribed a Medrol Dosepak. Monitor for any swelling or any problems we'll take the medication. Continuing the cam boot as needed or to come out of it as the pain decreases.  Followup in 1 week or sooner if any palms are to arise or any changes symptoms.

## 2014-01-11 ENCOUNTER — Ambulatory Visit (INDEPENDENT_AMBULATORY_CARE_PROVIDER_SITE_OTHER): Payer: Medicare Other | Admitting: Podiatry

## 2014-01-11 ENCOUNTER — Encounter: Payer: Self-pay | Admitting: Podiatry

## 2014-01-11 VITALS — BP 120/70 | HR 73 | Resp 18

## 2014-01-11 DIAGNOSIS — M1000 Idiopathic gout, unspecified site: Secondary | ICD-10-CM

## 2014-01-11 DIAGNOSIS — M109 Gout, unspecified: Secondary | ICD-10-CM

## 2014-01-11 NOTE — Patient Instructions (Signed)

## 2014-01-11 NOTE — Progress Notes (Signed)
Patient ID: [name], male   DOB: 02/19/1956, 58 y.o.   MRN: [id number]  Subjective: [name] returns the office they for followup evaluation of left foot pain, swelling, warmth. After last appointment patient had blood work obtained which revealed an increase in uric acid consistent with gout. The he was started on a Medrol Dosepak. Finish the medication this morning. He has had no side effects the medication. States that since starting the medication has had significant decrease in pain, swelling, warmth. He is able to ambulate in a regular sneaker without difficulties. No pain with range of motion of the ankle. No other complaints at this time. No acute changes since last appointment.  Objective: AAO x3, NAD DP/PT pulses palpable b/l. CRT < 3sec Decreased protective sensation with Dorann OuSimms Weinstein monofilament. Significant decrease in edema to the left lower extremity. No increased warmth. No evidence of fluctuance, crepitus. No ascending cellulitis. Range of motion of the ankle joint is within normal limits and without pain. MMT 5/5, ROM WNL.  No open skin lesions.   Assessment: 58 year old male with resolved gout.  Plan: -Treatment options discussed including alternatives, risks, complications  -At this time patient symptoms has resolved he is able to ambulate without difficulty. He has since finished his course of Medrol Dosepak. -Patient was educated on gout and ways to prevent recurrence and the risk factors that predispose him to gout -Followup as needed. Call the office with any questions, concerns, changes symptoms.

## 2014-01-31 ENCOUNTER — Ambulatory Visit: Payer: Medicare Other

## 2014-02-01 ENCOUNTER — Encounter: Payer: Self-pay | Admitting: Podiatry

## 2014-02-01 ENCOUNTER — Ambulatory Visit (INDEPENDENT_AMBULATORY_CARE_PROVIDER_SITE_OTHER): Payer: Medicare Other | Admitting: Podiatry

## 2014-02-01 VITALS — BP 142/69 | HR 76 | Resp 14

## 2014-02-01 DIAGNOSIS — M79676 Pain in unspecified toe(s): Secondary | ICD-10-CM

## 2014-02-01 DIAGNOSIS — B351 Tinea unguium: Secondary | ICD-10-CM

## 2014-02-05 ENCOUNTER — Encounter: Payer: Self-pay | Admitting: Podiatry

## 2014-02-05 NOTE — Progress Notes (Signed)
Patient ID: [name], male   DOB: 05/10/1955, 58 y.o.   MRN: [id number]  Subjective: [name] returns to the office today with complaints of painful elongated nails. Denies any redness or drainage from the nail sites. Patient with previous A treated for gout for which she had a Medrol Dosepak. He states that he has no pain to the left foot or ankle over the area of the prior gout. Denies any systemic complaints of fevers, chills, nausea, vomiting. No other complaints at this time.  Objective: AAO 3, NAD DP/PT pulses palpable bilaterally, CRT less than 3 seconds Protective sensation decreased with Sims Wiesner monofilament nails hypertrophic, dystrophic, elongated, discolored 10. No surrounding erythema or drainage. No open lesions or pre-ulceration or lesions. No calf pain, swelling, warmth. MMT 5/5, ROM WNL  Assessment: 58 year old male with symptomatic onychomycosis  Plan: -Treatment options discussed including alternatives, risks, complications. -Nail shop and debrided 10 without complications -Discussed the importance of daily foot inspection. -Follow-up in 3 months or sooner if any problems are to arise or any change in symptoms. In the meantime call the office with any questions, concerns.

## 2014-03-15 ENCOUNTER — Other Ambulatory Visit: Payer: Self-pay | Admitting: Interventional Cardiology

## 2014-04-20 ENCOUNTER — Other Ambulatory Visit: Payer: Self-pay

## 2014-04-24 NOTE — Telephone Encounter (Signed)
You can refill the lyrica on the same dosage he had before. Thanks.

## 2014-04-25 ENCOUNTER — Other Ambulatory Visit: Payer: Self-pay | Admitting: Podiatry

## 2014-04-25 NOTE — Telephone Encounter (Signed)
I called the patients pharmacy and refilled his lyrica to the same dose he had before.

## 2014-05-03 ENCOUNTER — Encounter: Payer: Self-pay | Admitting: Podiatry

## 2014-05-03 ENCOUNTER — Ambulatory Visit (INDEPENDENT_AMBULATORY_CARE_PROVIDER_SITE_OTHER): Payer: 59 | Admitting: Podiatry

## 2014-05-03 VITALS — BP 121/75 | HR 71 | Resp 18

## 2014-05-03 DIAGNOSIS — B351 Tinea unguium: Secondary | ICD-10-CM

## 2014-05-03 DIAGNOSIS — G609 Hereditary and idiopathic neuropathy, unspecified: Secondary | ICD-10-CM

## 2014-05-03 DIAGNOSIS — M79676 Pain in unspecified toe(s): Secondary | ICD-10-CM

## 2014-05-03 MED ORDER — EFINACONAZOLE 10 % EX SOLN: 1.0000 [drp] | Freq: Every day | CUTANEOUS | Status: DC

## 2014-05-03 NOTE — Progress Notes (Signed)
   Subjective:    Patient ID: Edwina BarthDennis K Ezzell, male    DOB: Aug 25, 1955, 59 y.o.   MRN: [id number]  HPI  59 year old male presents the office today for painful elongated toenails which are causing pressure in shoes. He denies any recent redness or drainage from the nail sites. No acute changes since last appointment and no other complaints at this time. Denies any systemic complaints such as fevers, chills, nausea, vomiting.    Review of Systems  All other systems reviewed and are negative.      Objective:   Physical Exam AAO 3, NAD DP/PT pulses palpable bilaterally, CRT less than 3 seconds Protective sensation decreased with Simms Weinstein monofilament, vibratory sensation decreased. Achilles tendon reflex intact. Nails are hypertrophic, dystrophic, elongated, brittle, discolored 10. There is no surrounding erythema or drainage from the nail sites. No open lesions or pre-ulcer lesions identified. No interdigital maceration. No pain with calf compression, swelling, warmth, erythema. No other areas of tenderness to bilateral lower extremities and there is no overlying erythema, increased warmth.       Assessment & Plan:  59 year old male with symptomatic onychomycosis -Treatment options were discussed the patient playing alternatives, risks, complications. -Nail sharply debrided 10 without complication/bleeding. -Patient was inquiring about treatment options for onychomycosis. He has been using tried over-the-counter therapies area and elected to proceed with topical prescription medication. Jublia was prescribed. Side effects of the medication were discussed the patient directed to call the office if any occur. -Follow-up in 3 months or sooner should any problems arise. In the meantime, encouraged to call the office with any questions, concerns, change in symptoms.

## 2014-05-09 ENCOUNTER — Encounter: Payer: Self-pay | Admitting: Podiatry

## 2014-05-09 ENCOUNTER — Other Ambulatory Visit: Payer: Self-pay

## 2014-05-10 NOTE — Telephone Encounter (Signed)
Okay to refill this once but he will have to see Dr. Ardelle AntonWagoner before any future refills.

## 2014-06-11 ENCOUNTER — Other Ambulatory Visit: Payer: Self-pay | Admitting: Podiatry

## 2014-06-11 ENCOUNTER — Other Ambulatory Visit: Payer: Self-pay | Admitting: Interventional Cardiology

## 2014-06-16 ENCOUNTER — Other Ambulatory Visit: Payer: Self-pay | Admitting: *Deleted

## 2014-06-16 MED ORDER — METANX 3-35-2 MG PO TABS: 1.0000 | ORAL_TABLET | Freq: Two times a day (BID) | ORAL | Status: DC

## 2014-06-16 NOTE — Telephone Encounter (Signed)
Refill request for Metanx.  Dr. Bary CastillaWagoner okayed the refill.

## 2014-06-22 ENCOUNTER — Other Ambulatory Visit: Payer: Self-pay

## 2014-07-10 ENCOUNTER — Telehealth: Payer: Self-pay | Admitting: *Deleted

## 2014-07-10 MED ORDER — PREGABALIN 150 MG PO CAPS: 75.0000 mg | ORAL_CAPSULE | Freq: Two times a day (BID) | ORAL | Status: DC

## 2014-07-10 NOTE — Telephone Encounter (Signed)
Refill request of Lyrica 150mg  #60.

## 2014-07-12 ENCOUNTER — Other Ambulatory Visit: Payer: Self-pay | Admitting: Interventional Cardiology

## 2014-07-14 ENCOUNTER — Other Ambulatory Visit: Payer: Self-pay | Admitting: Podiatry

## 2014-07-27 ENCOUNTER — Ambulatory Visit: Payer: Self-pay | Admitting: Interventional Cardiology

## 2014-08-02 ENCOUNTER — Ambulatory Visit: Payer: 59 | Admitting: Podiatry

## 2014-08-07 ENCOUNTER — Ambulatory Visit (INDEPENDENT_AMBULATORY_CARE_PROVIDER_SITE_OTHER): Payer: Medicare Other | Admitting: Podiatry

## 2014-08-07 ENCOUNTER — Encounter: Payer: Self-pay | Admitting: Podiatry

## 2014-08-07 VITALS — BP 94/52 | HR 72 | Resp 18

## 2014-08-07 DIAGNOSIS — B351 Tinea unguium: Secondary | ICD-10-CM

## 2014-08-07 DIAGNOSIS — E1140 Type 2 diabetes mellitus with diabetic neuropathy, unspecified: Secondary | ICD-10-CM | POA: Diagnosis not present

## 2014-08-07 DIAGNOSIS — M79676 Pain in unspecified toe(s): Secondary | ICD-10-CM

## 2014-08-07 NOTE — Progress Notes (Signed)
Patient ID: Levi BarthDennis K [name], male   DOB: October 23, 1955, 59 y.o.   MRN: [id number]  Subjective: 59 y.o.-year-old male returns the office today for painful, elongated, thickened toenails. Denies any redness or drainage around the nails. Denies any acute changes since last appointment and no new complaints today. Denies any systemic complaints such as fevers, chills, nausea, vomiting.   Objective: AAO 3, NAD DP/PT pulses palpable, CRT less than 3 seconds Protective sensation decreased with Simms Weinstein monofilament, Achilles tendon reflex intact.  Nails hypertrophic, dystrophic, elongated, brittle, discolored 10. There is tenderness overlying the nails 1-5 bilaterally. There is no surrounding erythema or drainage along the nail sites. Smaller hyperkeratotic lesions are present on the medial aspect of the first metatarsal head as well as along the distal aspect of the digits around the nail sites. Upon debridement lesions there is no underlying ulceration, drainage or other clinical signs of infection. No open lesions or other pre-ulcerative lesions are identified. No other areas of tenderness bilateral lower extremities. No overlying edema, erythema, increased warmth. No pain with calf compression, swelling, warmth, erythema.  Assessment: Patient presents with symptomatic onychomycosis  Plan: -Treatment options including alternatives, risks, complications were discussed -Nails sharply debrided 10 ithout complication/bleeding. -Discussed daily foot inspection. If there are any changes, to call the office immediately.  -Follow-up in 3 months or sooner if any problems are to arise. In the meantime, encouraged to call the office with any questions, concerns, changes symptoms.

## 2014-09-10 ENCOUNTER — Other Ambulatory Visit: Payer: Self-pay | Admitting: Interventional Cardiology

## 2014-09-25 ENCOUNTER — Ambulatory Visit (INDEPENDENT_AMBULATORY_CARE_PROVIDER_SITE_OTHER): Payer: Medicare Other | Admitting: Interventional Cardiology

## 2014-09-25 ENCOUNTER — Encounter: Payer: Self-pay | Admitting: Interventional Cardiology

## 2014-09-25 VITALS — BP 104/70 | HR 61 | Ht 68.0 in | Wt 296.8 lb

## 2014-09-25 DIAGNOSIS — I10 Essential (primary) hypertension: Secondary | ICD-10-CM | POA: Diagnosis not present

## 2014-09-25 DIAGNOSIS — I2510 Atherosclerotic heart disease of native coronary artery without angina pectoris: Secondary | ICD-10-CM

## 2014-09-25 DIAGNOSIS — E785 Hyperlipidemia, unspecified: Secondary | ICD-10-CM | POA: Diagnosis not present

## 2014-09-25 MED ORDER — NITROGLYCERIN 0.4 MG SL SUBL: 0.4000 mg | SUBLINGUAL_TABLET | SUBLINGUAL | Status: DC | PRN

## 2014-09-25 NOTE — Patient Instructions (Signed)
Medication Instructions:  Your physician recommends that you continue on your current medications as directed. Please refer to the Current Medication list given to you today.   Labwork: NONE  Testing/Procedures: NONE  Follow-Up: Your physician wants you to follow-up in: 12 months with Dr. Wilhelmsen. You will receive a reminder letter in the mail two months in advance. If you don't receive a letter, please call our office to schedule the follow-up appointment.   Any Other Special Instructions Will Be Listed Below (If Applicable).  Dr. Mayr recommends that you increase your physical activity.   

## 2014-09-25 NOTE — Progress Notes (Signed)
Cardiology Office Note   Date:  09/25/2014   ID:  [name], DOB [birth year], MRN [id number]  PCP:  Ginette Otto, MD  Cardiologist:  Lesleigh Noe, MD   Chief Complaint  Patient presents with  . Follow-up    CAD      History of Present Illness: [name] is a 59 y.o. male who presents for CAD, known total occlusion of the right coronary with left-to-right collaterals by cath in 2008, hypertension, sleep apnea, morbid obesity, and hyperlipidemia.  He is staying relatively active. He has mild angina walking up inclines. This is not a change in overall exertional tolerance. Nitroglycerin use is rare. He denies orthopnea and PND. He uses C Pap. He is having time impingement because he cares for his wife who has MS and now his mother-in-law lives with them and requires his help because of blindness.    Past Medical History  Diagnosis Date  . Diabetes mellitus without complication   . Hypertension     History reviewed. No pertinent past surgical history.   Current Outpatient Prescriptions  Medication Sig Dispense Refill  . aspirin 325 MG tablet Take 325 mg by mouth daily.    Marland Kitchen atorvastatin (LIPITOR) 80 MG tablet Take 1 tab daily    . celecoxib (CELEBREX) 200 MG capsule TAKE ONE CAPSULE BY MOUTH EVERY DAY 30 capsule 3  . Cholecalciferol (VITAMIN D-3 PO) Take by mouth. 2000iu 1 tab daily    . Efinaconazole 10 % SOLN Apply 1 drop topically daily. 4 mL 11  . Glucosamine-Chondroitin (COSAMIN DS PO) Take by mouth. Take 2 tabs daily    . isosorbide mononitrate (IMDUR) 120 MG 24 hr tablet TAKE 1 TABLET BY MOUTH EVERY DAY 90 tablet 0  . lisinopril (PRINIVIL,ZESTRIL) 5 MG tablet Take 1 tab daily    . metoprolol (LOPRESSOR) 50 MG tablet TAKE 1 TABLET BY MOUTH TWICE DAILY 180 tablet 0  . Misc Natural Products (WHITE WILLOW BARK PO) Take by mouth.  1 tab daily    . Multiple Vitamin (MULTIVITAMIN) tablet Take 1 tablet by mouth daily.    . multivitamin (METANX)  3-35-2 MG TABS tablet Take 1 tablet by mouth 2 (two) times daily. 180 tablet 2  . nitroGLYCERIN (NITROSTAT) 0.4 MG SL tablet Place 1 tablet (0.4 mg total) under the tongue every 5 (five) minutes x 3 doses as needed for chest pain. 25 tablet 3  . omeprazole (PRILOSEC) 20 MG capsule Take 20 mg by mouth daily.     . pregabalin (LYRICA) 150 MG capsule Take 1 capsule (150 mg total) by mouth 2 (two) times daily. 60 capsule 11  . triamterene-hydrochlorothiazide (MAXZIDE) 75-50 MG per tablet Take 1/2 tab a day    . WELCHOL 625 MG tablet Take 3 tabs at bedtime     No current facility-administered medications for this visit.    Allergies:   Penicillins    Social History:  The patient  reports that he has never smoked. He does not have any smokeless tobacco history on file. He reports that he does not drink alcohol or use illicit drugs.   Family History:  The patient's family history includes Heart attack in his father and mother.    ROS:  Please see the history of present illness.   Otherwise, review of systems are positive for back discomfort. Has occasional palpitations but no significant disability related.   All other systems are reviewed and negative.    PHYSICAL EXAM: VS:  BP 104/70 mmHg  Pulse 61  Ht 5\' 8"  (1.727 m)  Wt 134.628 kg (296 lb 12.8 oz)  BMI 45.14 kg/m2 , BMI Body mass index is 45.14 kg/(m^2). GEN: Well nourished, well developed, in no acute distress HEENT: normal Neck: no JVD, carotid bruits, or masses Cardiac: RRR; no murmurs, rubs, or gallops,no edema  Respiratory:  clear to auscultation bilaterally, normal work of breathing GI: soft, nontender, nondistended, + BS MS: no deformity or atrophy Skin: warm and dry, no rash Neuro:  Strength and sensation are intact Psych: euthymic mood, full affect   EKG:  EKG is ordered today. The ekg ordered today demonstrates normal sinus rhythm, incomplete right bundle-branch block, no change compared to prior tracings.   Recent  Labs: 01/05/2014: Hemoglobin 14.8; Platelets 170    Lipid Panel    Component Value Date/Time   CHOL  03/04/2007 0530    165        ATP III CLASSIFICATION:  <200     mg/dL   Desirable  956-387  mg/dL   Borderline High  >=564    mg/dL   High   TRIG 332* 95/18/8416 0530   HDL 30* 03/04/2007 0530   CHOLHDL 5.5 03/04/2007 0530   VLDL 74* 03/04/2007 0530   LDLCALC  03/04/2007 0530    61        Total Cholesterol/HDL:CHD Risk Coronary Heart Disease Risk Table                     Men   Women  1/2 Average Risk   3.4   3.3      Wt Readings from Last 3 Encounters:  09/25/14 134.628 kg (296 lb 12.8 oz)  01/05/14 133.811 kg (295 lb)  08/02/13 133.811 kg (295 lb)      Other studies Reviewed: Additional studies/ records that were reviewed today include: . Review of the above records demonstrates:    ASSESSMENT AND PLAN:  Coronary artery disease involving native coronary artery of native heart without angina pectoris - rare occasional nitroglycerin use  Essential hypertension -well controlled  Hyperlipidemia - followed by primary care   Morbid obesity - continues to be a problem. Recommended increased exertional activities L facilitate weight loss.   Current medicines are reviewed at length with the patient today.  The patient does not have concerns regarding medicines.  The following changes have been made:  no change. Increase physical activity.  Labs/ tests ordered today include:   Orders Placed This Encounter  Procedures  . EKG 12-Lead     Disposition:   FU with HS in 1 year  Signed, Lesleigh Noe, MD  09/25/2014 3:33 PM    [facility] [address] [place], [place], [place]  [address] Phone: [phone]; Fax: [phone]

## 2014-10-09 ENCOUNTER — Other Ambulatory Visit: Payer: Self-pay

## 2014-10-09 ENCOUNTER — Other Ambulatory Visit: Payer: Self-pay | Admitting: Interventional Cardiology

## 2014-10-09 MED ORDER — METOPROLOL TARTRATE 50 MG PO TABS: 50.0000 mg | ORAL_TABLET | Freq: Two times a day (BID) | ORAL | Status: DC

## 2014-11-08 ENCOUNTER — Ambulatory Visit (INDEPENDENT_AMBULATORY_CARE_PROVIDER_SITE_OTHER): Payer: Medicare Other | Admitting: Podiatry

## 2014-11-08 ENCOUNTER — Encounter: Payer: Self-pay | Admitting: Podiatry

## 2014-11-08 DIAGNOSIS — M79676 Pain in unspecified toe(s): Secondary | ICD-10-CM | POA: Diagnosis not present

## 2014-11-08 DIAGNOSIS — B351 Tinea unguium: Secondary | ICD-10-CM | POA: Diagnosis not present

## 2014-11-09 ENCOUNTER — Ambulatory Visit: Payer: Medicare Other | Admitting: Podiatry

## 2014-11-09 NOTE — Progress Notes (Signed)
Patient ID: [name], male   DOB: 1955/06/24, 59 y.o.   MRN: [id number]  Subjective: This patient presents for a scheduled visit complaining of painful toenails and requests toenail debridement.  Objective: The toenails are elongated, brittle, discolored, hypertrophic and tender to direct palpation 6-10  Assessment: Symptomatic onychomycoses 6-10  Plan: Debridement of toenails 10 without any bleeding  Reappoint 3 months

## 2014-11-15 ENCOUNTER — Other Ambulatory Visit: Payer: Self-pay | Admitting: Podiatry

## 2014-12-09 ENCOUNTER — Other Ambulatory Visit: Payer: Self-pay | Admitting: Interventional Cardiology

## 2014-12-15 ENCOUNTER — Other Ambulatory Visit: Payer: Self-pay | Admitting: Podiatry

## 2015-01-15 ENCOUNTER — Other Ambulatory Visit: Payer: Self-pay | Admitting: Podiatry

## 2015-02-05 ENCOUNTER — Ambulatory Visit (INDEPENDENT_AMBULATORY_CARE_PROVIDER_SITE_OTHER): Payer: Medicare Other | Admitting: Podiatry

## 2015-02-05 ENCOUNTER — Encounter: Payer: Self-pay | Admitting: Podiatry

## 2015-02-05 DIAGNOSIS — B351 Tinea unguium: Secondary | ICD-10-CM

## 2015-02-05 DIAGNOSIS — M7661 Achilles tendinitis, right leg: Secondary | ICD-10-CM

## 2015-02-05 DIAGNOSIS — M79676 Pain in unspecified toe(s): Secondary | ICD-10-CM

## 2015-02-05 DIAGNOSIS — E1140 Type 2 diabetes mellitus with diabetic neuropathy, unspecified: Secondary | ICD-10-CM | POA: Diagnosis not present

## 2015-02-05 NOTE — Progress Notes (Signed)
Patient ID: [name], male   DOB: 1955-08-08, 59 y.o.   MRN: [id number]  Subjective: 59 y.o.-year-old male returns the office today for painful, elongated, thickened toenails which he cannot trim himself. Denies any redness or drainage around the nails. He states he has also had some intermittent pain to his right achilles tendon. She states that the pain is not a daily basis. This has been ongoing over the last couple of months. Denies any recent injury or trauma. No swelling or redness. Denies any acute changes since last appointment and no new complaints today. Denies any systemic complaints such as fevers, chills, nausea, vomiting.   Objective: AAO 3, NAD DP/PT pulses palpable, CRT less than 3 seconds Protective sensation decreased with Simms Weinstein monofilament Nails hypertrophic, dystrophic, elongated, brittle, discolored 10. There is tenderness overlying the nails 1-5 bilaterally. There is no surrounding erythema or drainage along the nail sites. No open lesions or other pre-ulcerative lesions are identified. There is mild topalpation on the mid substance the Achilles tendon on the right side. There is no defect noted in Elramahompson test is negative. There is edema, erythema, increase in warmth. No other areas of tenderness bilateral lower extremities. No overlying edema, erythema, increased warmth. No pain with calf compression, swelling, warmth, erythema.  Assessment: Patient presents with symptomatic onychomycosis; Achilles tendinitis right side  Plan: -Treatment options including alternatives, risks, complications were discussed -Nails sharply debrided 10 ithout complication/bleeding. -Stretching and icing exercises daily for Achilles tendinitis. Discussion gear modifications. The pain is not resolved within 4 weeks to call the office for follow-up appointment or sooner if any problems are to arise. -Discussed daily foot inspection. If there are any changes, to call the office  immediately.  -Follow-up in 3 months or sooner if any problems are to arise. In the meantime, encouraged to call the office with any questions, concerns, changes symptoms.  Ovid CurdMatthew Wagoner, DPM

## 2015-02-05 NOTE — Patient Instructions (Signed)

## 2015-02-13 ENCOUNTER — Other Ambulatory Visit: Payer: Self-pay | Admitting: Podiatry

## 2015-02-15 ENCOUNTER — Other Ambulatory Visit: Payer: Self-pay | Admitting: Interventional Cardiology

## 2015-02-19 ENCOUNTER — Other Ambulatory Visit: Payer: Self-pay | Admitting: Podiatry

## 2015-02-19 ENCOUNTER — Telehealth: Payer: Self-pay | Admitting: *Deleted

## 2015-02-19 MED ORDER — PREGABALIN 150 MG PO CAPS: 150.0000 mg | ORAL_CAPSULE | Freq: Two times a day (BID) | ORAL | Status: DC

## 2015-02-19 NOTE — Telephone Encounter (Signed)
Refill request for Lyrica should be fwd to pt pcp

## 2015-02-23 NOTE — Telephone Encounter (Signed)
Entered in error

## 2015-03-12 ENCOUNTER — Other Ambulatory Visit: Payer: Self-pay | Admitting: Podiatry

## 2015-03-16 ENCOUNTER — Other Ambulatory Visit: Payer: Self-pay | Admitting: Podiatry

## 2015-03-19 ENCOUNTER — Telehealth: Payer: Self-pay | Admitting: *Deleted

## 2015-03-19 MED ORDER — CELECOXIB 200 MG PO CAPS: 200.0000 mg | ORAL_CAPSULE | Freq: Every day | ORAL | Status: DC

## 2015-03-19 NOTE — Telephone Encounter (Signed)
Orders for Celebrex sent.

## 2015-05-14 ENCOUNTER — Encounter: Payer: Self-pay | Admitting: Podiatry

## 2015-05-14 ENCOUNTER — Ambulatory Visit (INDEPENDENT_AMBULATORY_CARE_PROVIDER_SITE_OTHER): Payer: Medicare Other | Admitting: Podiatry

## 2015-05-14 ENCOUNTER — Telehealth: Payer: Self-pay | Admitting: *Deleted

## 2015-05-14 DIAGNOSIS — B351 Tinea unguium: Secondary | ICD-10-CM | POA: Diagnosis not present

## 2015-05-14 DIAGNOSIS — E1140 Type 2 diabetes mellitus with diabetic neuropathy, unspecified: Secondary | ICD-10-CM

## 2015-05-14 DIAGNOSIS — M79676 Pain in unspecified toe(s): Secondary | ICD-10-CM

## 2015-05-14 MED ORDER — NONFORMULARY OR COMPOUNDED ITEM: Status: DC

## 2015-05-14 NOTE — Telephone Encounter (Signed)
Faxed [facility] Peripheral Neuropathy cream orders.

## 2015-05-14 NOTE — Progress Notes (Signed)
Patient ID: HELTON OLESON, male   DOB: 1956-02-19, 60 y.o.   MRN: [id number]   Subjective: 60 y.o.-year-old male returns the office today for painful, elongated, thickened toenails which he cannot trim himself. Denies any redness or drainage around the nails. He has been taking lyrica for neuropathy but still has pain from it and is inquiring about other options. Denies any acute changes since last appointment and no new complaints today. Denies any systemic complaints such as fevers, chills, nausea, vomiting.   Objective: AAO 3, NAD DP/PT pulses palpable, CRT less than 3 seconds Protective sensation decreased with Simms Weinstein monofilament Nails hypertrophic, dystrophic, elongated, brittle, discolored 10. There is tenderness overlying the nails 1-5 bilaterally. There is no surrounding erythema or drainage along the nail sites. No open lesions or other pre-ulcerative lesions are identified. Currently no pain along the course or insertion of the achilles tendon.  No other areas of tenderness bilateral lower extremities. No overlying edema, erythema, increased warmth. No pain with calf compression, swelling, warmth, erythema.  Assessment: Patient presents with symptomatic onychomycosis  Plan: -Treatment options including alternatives, risks, complications were discussed -Nails sharply debrided 10 ithout complication/bleeding. -Discussed gabapentin and other medication options for neuropathy. Will start with compound cream to see how he responds. Will potentially change to gabapentin or other medication if symptoms not improving.  -Discussed daily foot inspection. If there are any changes, to call the office immediately.  -Follow-up in 3 months or sooner if any problems are to arise. In the meantime, encouraged to call the office with any questions, concerns, changes symptoms.  Ovid Curd, DPM

## 2015-05-15 ENCOUNTER — Other Ambulatory Visit: Payer: Self-pay | Admitting: Podiatry

## 2015-06-06 ENCOUNTER — Telehealth: Payer: Self-pay | Admitting: *Deleted

## 2015-06-06 MED ORDER — METANX 3-35-2 MG PO TABS: 1.0000 | ORAL_TABLET | Freq: Two times a day (BID) | ORAL | Status: DC

## 2015-07-30 NOTE — Telephone Encounter (Signed)
Entered in error

## 2015-08-12 ENCOUNTER — Other Ambulatory Visit: Payer: Self-pay | Admitting: Podiatry

## 2015-08-13 ENCOUNTER — Ambulatory Visit (INDEPENDENT_AMBULATORY_CARE_PROVIDER_SITE_OTHER): Payer: Medicare Other | Admitting: Podiatry

## 2015-08-13 ENCOUNTER — Encounter: Payer: Self-pay | Admitting: Podiatry

## 2015-08-13 DIAGNOSIS — G629 Polyneuropathy, unspecified: Secondary | ICD-10-CM

## 2015-08-13 DIAGNOSIS — M79676 Pain in unspecified toe(s): Secondary | ICD-10-CM | POA: Diagnosis not present

## 2015-08-13 DIAGNOSIS — B351 Tinea unguium: Secondary | ICD-10-CM

## 2015-08-13 NOTE — Progress Notes (Signed)
Patient ID: [name], male   DOB: 1956-02-15, 60 y.o.   MRN: [id number]  Subjective: 60-y.o.-year-old male returns the office today for painful, elongated, thickened toenails which he cannot trim himself. Denies any redness or drainage around the nails. On lyrica and compound cream for neuroapthy. Denies any acute changes since last appointment and no new complaints today. Denies any systemic complaints such as fevers, chills, nausea, vomiting.   Objective: AAO 3, NAD DP/PT pulses palpable, CRT less than 3 seconds Protective sensation decreased with Simms Weinstein monofilament Nails hypertrophic, dystrophic, elongated, brittle, discolored 10. There is tenderness overlying the nails 1-5 bilaterally. There is no surrounding erythema or drainage along the nail sites. No open lesions or other pre-ulcerative lesions are identified. Currently no pain along the course or insertion of the achilles tendon.  No other areas of tenderness bilateral lower extremities. No overlying edema, erythema, increased warmth. No pain with calf compression, swelling, warmth, erythema.  Assessment: Patient presents with symptomatic onychomycosis  Plan: -Treatment options including alternatives, risks, complications were discussed -Nails sharply debrided 10 ithout complication/bleeding. -Continue compound cream and lyrica as this is helping.  -Discussed daily foot inspection. If there are any changes, to call the office immediately.  -Follow-up in 3 months or sooner if any problems are to arise. In the meantime, encouraged to call the office with any questions, concerns, changes symptoms.  Ovid CurdMatthew Wagoner, DPM

## 2015-08-14 ENCOUNTER — Telehealth: Payer: Self-pay | Admitting: *Deleted

## 2015-08-14 NOTE — Telephone Encounter (Signed)
Lyrica 150mg  #60 one capsule bid called to [facility].

## 2015-09-02 ENCOUNTER — Other Ambulatory Visit: Payer: Self-pay | Admitting: Interventional Cardiology

## 2015-09-06 ENCOUNTER — Telehealth: Payer: Self-pay | Admitting: *Deleted

## 2015-09-06 MED ORDER — NONFORMULARY OR COMPOUNDED ITEM: Status: DC

## 2015-09-06 NOTE — Telephone Encounter (Signed)
Received fax request to change [facility] Peripheral Neuropathy cream to Authorized Substitute Pain Cream.  Dr. Bary CastillaWagoner okayed order.  [facility]

## 2015-09-13 ENCOUNTER — Other Ambulatory Visit: Payer: Self-pay | Admitting: Podiatry

## 2015-09-14 NOTE — Telephone Encounter (Signed)
Pt should continue with Diabetic care as previously.

## 2015-09-18 ENCOUNTER — Telehealth: Payer: Self-pay | Admitting: *Deleted

## 2015-09-18 NOTE — Telephone Encounter (Signed)
Lyrica called to [facility]. 

## 2015-09-28 ENCOUNTER — Telehealth: Payer: Self-pay | Admitting: *Deleted

## 2015-09-28 MED ORDER — L-METHYLFOLATE-B6-B12 3-35-2 MG PO TABS: ORAL_TABLET | ORAL | Status: DC

## 2015-09-28 NOTE — Telephone Encounter (Addendum)
Refill request for Metanx. Dr. Ardelle AntonWagoner states order as previously.

## 2015-09-29 ENCOUNTER — Other Ambulatory Visit: Payer: Self-pay | Admitting: Interventional Cardiology

## 2015-10-28 ENCOUNTER — Other Ambulatory Visit: Payer: Self-pay | Admitting: Interventional Cardiology

## 2015-10-29 NOTE — Telephone Encounter (Signed)
Edwina BarthDennis K Diers  09/29/2015  Refill  MRN:  [id number]   Description: 60 year old male  Provider: Lyn RecordsHenry W Oftedahl, MD  Department: [address] [facility]       Call Documentation     No notes of this type exist for this encounter.     Encounter MyChart Messages     No messages in this encounter     Approved      Disp Refills Start End    metoprolol (LOPRESSOR) 50 MG tablet 60 tablet 0 10/01/2015     Sig - Route:  Take 1 tablet (50 mg total) by mouth 2 (two) times daily. Patient needs an appointment for a 1 yr follow up. Please call to schedule - Oral    Class:  Normal    DAW:  No    Authorizing Provider:  Lyn RecordsHenry W Pal, MD    Ordering User:  Valrie HartMindy L Isley, CMA      Visit Pharmacy     [facility] [id number] - [place] - [address] [facility] [address]

## 2015-11-10 ENCOUNTER — Other Ambulatory Visit: Payer: Self-pay | Admitting: Interventional Cardiology

## 2015-11-19 ENCOUNTER — Other Ambulatory Visit: Payer: Self-pay | Admitting: Interventional Cardiology

## 2015-11-19 ENCOUNTER — Ambulatory Visit (INDEPENDENT_AMBULATORY_CARE_PROVIDER_SITE_OTHER): Payer: Medicare Other | Admitting: Podiatry

## 2015-11-19 DIAGNOSIS — B351 Tinea unguium: Secondary | ICD-10-CM | POA: Diagnosis not present

## 2015-11-19 DIAGNOSIS — M79676 Pain in unspecified toe(s): Secondary | ICD-10-CM | POA: Diagnosis not present

## 2015-11-19 NOTE — Progress Notes (Signed)
Patient ID: Levi BarthDennis K Vanhoesen, male   DOB: [birth year], 60 y.o.   MRN: [id number]  Subjective: 60-y.o.-year-old male returns the office today for painful, elongated, thickened toenails which he cannot trim himself. Denies any redness or drainage around the nails. Denies any acute changes since last appointment and no new complaints today. Denies any systemic complaints such as fevers, chills, nausea, vomiting.   Objective: AAO 3, NAD DP/PT pulses palpable, CRT less than 3 seconds Protective sensation decreased with Simms Weinstein monofilament Nails hypertrophic, dystrophic, elongated, brittle, discolored 10. There is tenderness overlying the nails 1-5 bilaterally. There is no surrounding erythema or drainage along the nail sites. No open lesions or other pre-ulcerative lesions are identified. Currently no pain along the course or insertion of the achilles tendon.  No other areas of tenderness bilateral lower extremities. No overlying edema, erythema, increased warmth. No pain with calf compression, swelling, warmth, erythema.  Assessment: Patient presents with symptomatic onychomycosis  Plan: -Treatment options including alternatives, risks, complications were discussed -Nails sharply debrided 10 ithout complication/bleeding. -Discussed daily foot inspection. If there are any changes, to call the office immediately.  -Follow-up in 3 months or sooner if any problems are to arise. In the meantime, encouraged to call the office with any questions, concerns, changes symptoms.  Ovid CurdMatthew Wagoner, DPM

## 2015-11-21 ENCOUNTER — Other Ambulatory Visit: Payer: Self-pay | Admitting: *Deleted

## 2015-11-21 ENCOUNTER — Telehealth: Payer: Self-pay | Admitting: Interventional Cardiology

## 2015-11-21 MED ORDER — METOPROLOL TARTRATE 50 MG PO TABS: 50.0000 mg | ORAL_TABLET | Freq: Two times a day (BID) | ORAL | 2 refills | Status: DC

## 2015-11-21 NOTE — Telephone Encounter (Signed)
New message         *STAT* If patient is at the pharmacy, call can be transferred to refill team.   1. Which medications need to be refilled? (please list name of each medication and dose if known)  metropolol 50mg  taken twice a day   2. Which pharmacy/location (including street and city if local pharmacy) is medication to be sent to? [facility] on [address]  3. Do they need a 30 day or 90 day supply? 30

## 2015-11-25 ENCOUNTER — Other Ambulatory Visit: Payer: Self-pay | Admitting: Interventional Cardiology

## 2015-12-03 ENCOUNTER — Other Ambulatory Visit: Payer: Self-pay | Admitting: Interventional Cardiology

## 2015-12-26 ENCOUNTER — Telehealth: Payer: Self-pay | Admitting: Podiatry

## 2015-12-26 NOTE — Telephone Encounter (Signed)
I called [facility] and lvm, I faxed over the auth too so im not sure what the hold up is but he should hear from them soon

## 2015-12-26 NOTE — Telephone Encounter (Signed)
Was wondering if my prescription for [facility] has been refilled? You can either let Shanda BumpsJessica know or you can call me. Thank you.

## 2015-12-27 NOTE — Telephone Encounter (Signed)
Thank you so much for the help [name].

## 2016-02-25 ENCOUNTER — Ambulatory Visit (INDEPENDENT_AMBULATORY_CARE_PROVIDER_SITE_OTHER): Payer: Medicare Other | Admitting: Podiatry

## 2016-02-25 DIAGNOSIS — M79676 Pain in unspecified toe(s): Secondary | ICD-10-CM

## 2016-02-25 DIAGNOSIS — B351 Tinea unguium: Secondary | ICD-10-CM

## 2016-02-25 DIAGNOSIS — E1140 Type 2 diabetes mellitus with diabetic neuropathy, unspecified: Secondary | ICD-10-CM | POA: Diagnosis not present

## 2016-02-25 NOTE — Progress Notes (Signed)
Patient ID: [name], male   DOB: 12/22/1955, 60 y.o.   MRN: [id number]  Subjective: 60-y.o.-year-old male returns the office today for painful, elongated, thickened toenails which he cannot trim himself. Denies any redness or drainage around the nails. Denies any acute changes since last appointment and no new complaints today. Denies any systemic complaints such as fevers, chills, nausea, vomiting.   Objective: AAO 3, NAD DP/PT pulses palpable, CRT less than 3 seconds Protective sensation decreased with Simms Weinstein monofilament Nails hypertrophic, dystrophic, elongated, brittle, discolored 10. There is tenderness overlying the nails 1-5 bilaterally. There is no surrounding erythema or drainage along the nail sites. No open lesions or other pre-ulcerative lesions are identified. Currently no pain along the course or insertion of the achilles tendon.  No other areas of tenderness bilateral lower extremities. No overlying edema, erythema, increased warmth. No pain with calf compression, swelling, warmth, erythema.  Assessment: Patient presents with symptomatic onychomycosis  Plan: -Treatment options including alternatives, risks, complications were discussed -Nails sharply debrided 10 ithout complication/bleeding. -Discussed daily foot inspection. If there are any changes, to call the office immediately.  -Follow-up in 3 months or sooner if any problems are to arise. In the meantime, encouraged to call the office with any questions, concerns, changes symptoms.  Zadaya Cuadra, DPM 

## 2016-02-28 ENCOUNTER — Ambulatory Visit (INDEPENDENT_AMBULATORY_CARE_PROVIDER_SITE_OTHER): Payer: Medicare Other | Admitting: Interventional Cardiology

## 2016-02-28 ENCOUNTER — Encounter: Payer: Self-pay | Admitting: Interventional Cardiology

## 2016-02-28 VITALS — BP 124/84 | HR 52 | Ht 68.0 in | Wt 294.0 lb

## 2016-02-28 DIAGNOSIS — I2510 Atherosclerotic heart disease of native coronary artery without angina pectoris: Secondary | ICD-10-CM

## 2016-02-28 DIAGNOSIS — I10 Essential (primary) hypertension: Secondary | ICD-10-CM | POA: Diagnosis not present

## 2016-02-28 DIAGNOSIS — E782 Mixed hyperlipidemia: Secondary | ICD-10-CM

## 2016-02-28 DIAGNOSIS — G4733 Obstructive sleep apnea (adult) (pediatric): Secondary | ICD-10-CM

## 2016-02-28 NOTE — Patient Instructions (Signed)
Medication Instructions:  None  Labwork: None  Testing/Procedures: None  Follow-Up: Your physician wants you to follow-up in: 1 year with Dr. Stander. You will receive a reminder letter in the mail two months in advance. If you don't receive a letter, please call our office to schedule the follow-up appointment.   Any Other Special Instructions Will Be Listed Below (If Applicable).  Your physician discussed the importance of regular exercise and recommended that you start or continue a regular exercise program for good health.    If you need a refill on your cardiac medications before your next appointment, please call your pharmacy.   

## 2016-02-28 NOTE — Progress Notes (Signed)
Cardiology Office Note    Date:  02/28/2016   ID:  [name], DOB [birth year], MRN [id number]  PCP:  Ginette Otto, MD  Cardiologist: Lesleigh Noe, MD   Chief Complaint  Patient presents with  . Coronary Artery Disease    History of Present Illness:  [name] is a 60 y.o. male who presents for CAD, known total occlusion of the right coronary with left-to-right collaterals by cath in 2008, hypertension, sleep apnea, morbid obesity, and hyperlipidemia   Still smothered by responsibility for his wife and mother-in-law's health. He has very little time to take care of himself. He has been trying to walk. No chest discomfort or excessive dyspnea with activity. He denies orthopnea and PND. He endorses C Pap compliance. No palpitations or syncope.  Past Medical History:  Diagnosis Date  . Diabetes mellitus without complication (HCC)   . Hypertension     No past surgical history on file.  Current Medications: Outpatient Medications Prior to Visit  Medication Sig Dispense Refill  . aspirin 325 MG tablet Take 325 mg by mouth daily.    Marland Kitchen atorvastatin (LIPITOR) 80 MG tablet Take 1 tab daily    . celecoxib (CELEBREX) 200 MG capsule Take 1 capsule (200 mg total) by mouth daily. 30 capsule 11  . Cholecalciferol (VITAMIN D-3 PO) Take by mouth. 2000iu 1 tab daily    . Efinaconazole 10 % SOLN Apply 1 drop topically daily. 4 mL 11  . Glucosamine-Chondroitin (COSAMIN DS PO) Take by mouth. Take 2 tabs daily    . isosorbide mononitrate (IMDUR) 120 MG 24 hr tablet Take 1 tablet (120 mg total) by mouth daily. 90 tablet 0  . l-methylfolate-B6-B12 (METANX) 3-35-2 MG TABS tablet One tablet by mouth two times daily. 180 tablet 3  . lisinopril (PRINIVIL,ZESTRIL) 5 MG tablet Take 1 tab daily    . LYRICA 150 MG capsule TAKE 1 CAPSULE BY MOUTH TWICE DAILY 60 capsule 6  . metoprolol (LOPRESSOR) 50 MG tablet Take 1 tablet (50 mg total) by mouth 2 (two) times daily. Please keep  02/28/16 appointment for further refills 60 tablet 2  . Misc Natural Products (WHITE WILLOW BARK PO) Take by mouth. 400mg  1 tab daily    . Multiple Vitamin (MULTIVITAMIN) tablet Take 1 tablet by mouth daily.    . nitroGLYCERIN (NITROSTAT) 0.4 MG SL tablet Place 1 tablet (0.4 mg total) under the tongue every 5 (five) minutes x 3 doses as needed for chest pain. 25 tablet 3  . NONFORMULARY OR COMPOUNDED ITEM [facility] compound:  Peripheral Neuropathy Cream - Bupivacaine 1%, Doxepin 3%, Gabapentin 6%, Pentoxifylline 3%, Topiramate 1% dispense 120 gram, apply 1-2 grams to affected area 3-4 times daily, +2refills 120 each 2  . NONFORMULARY OR COMPOUNDED ITEM [facility]:  Authorized Substitute Pain Cream - Ibuprofen 15%, Baclofen 1%, Gabapentin 3%, Lidocaine 2%, apply 1-2 grams to affected area 3-4 times daily, +3Refills. 120 each 3  . omeprazole (PRILOSEC) 20 MG capsule Take 20 mg by mouth daily.     Marland Kitchen triamterene-hydrochlorothiazide (MAXZIDE) 75-50 MG per tablet Take 1/2 tab a day    . WELCHOL 625 MG tablet Take 3 tabs at bedtime    . CELEBREX 200 MG capsule TAKE 1 CAPSULE BY MOUTH EVERY DAY (Patient not taking: Reported on 02/28/2016) 30 capsule 11  . isosorbide mononitrate (IMDUR) 120 MG 24 hr tablet TAKE 1 TABLET BY MOUTH EVERY DAY (Patient not taking: Reported on 02/28/2016) 90 tablet 2  .  multivitamin (METANX) 3-35-2 MG TABS tablet Take 1 tablet by mouth 2 (two) times daily. (Patient not taking: Reported on 02/28/2016) 180 tablet 3   No facility-administered medications prior to visit.      Allergies:   Penicillins   Social History   Social History  . Marital status: Married    Spouse name: N/A  . Number of children: N/A  . Years of education: N/A   Social History Main Topics  . Smoking status: Never Smoker  . Smokeless tobacco: Never Used  . Alcohol use No  . Drug use: No  . Sexual activity: Not Asked   Other Topics Concern  . None   Social History Narrative  .  None     Family History:  The patient's family history includes Heart attack in his father and mother.   ROS:   Please see the history of present illness.    Obesity and left knee discomfort or his major complaints. He has been trying to lose weight but has been unable. He also feels that there has been some depression related to his family situation.  All other systems reviewed and are negative.   PHYSICAL EXAM:   VS:  BP 124/84   Pulse (!) 52   Ht 5\' 8"  (1.727 m)   Wt 294 lb (133.4 kg)   BMI 44.70 kg/m    GEN: Well nourished, well developed, in no acute distress . Morbid obesity. HEENT: normal  Neck: no JVD, carotid bruits, or masses Cardiac: RRR; no murmurs, rubs, or gallops,no edema  Respiratory:  clear to auscultation bilaterally, normal work of breathing GI: soft, nontender, nondistended, + BS MS: no deformity or atrophy  Skin: warm and dry, no rash Neuro:  Alert and Oriented x 3, Strength and sensation are intact Psych: euthymic mood, full affect  Wt Readings from Last 3 Encounters:  02/28/16 294 lb (133.4 kg)  09/25/14 296 lb 12.8 oz (134.6 kg)  01/05/14 295 lb (133.8 kg)      Studies/Labs Reviewed:   EKG:  EKG  Normal sinus rhythm, RSR prime V1, leftward axis. No change compared to prior tracings.  Recent Labs: No results found for requested labs within last 8760 hours.   Lipid Panel    Component Value Date/Time   CHOL  03/04/2007 0530    165        ATP III CLASSIFICATION:  <200     mg/dL   Desirable  [id number]  mg/dL   Borderline High  >=914>=240    mg/dL   High   TRIG 782368 (H) 95/62/130811/20/2008 0530   HDL 30 (L) 03/04/2007 0530   CHOLHDL 5.5 03/04/2007 0530   VLDL 74 (H) 03/04/2007 0530   LDLCALC  03/04/2007 0530    61        Total Cholesterol/HDL:CHD Risk Coronary Heart Disease Risk Table                     Men   Women  1/2 Average Risk   3.4   3.3    Additional studies/ records that were reviewed today include:  No new studies have been performed. He is  known total occlusion of the right coronary based upon the most recent heart catheterization many years ago.    ASSESSMENT:    1. Coronary artery disease involving native coronary artery of native heart without angina pectoris   2. Essential hypertension   3. Mixed hyperlipidemia   4. Obstructive sleep apnea  PLAN:  In order of problems listed above:  1. Stable coronary disease without significant episodes of angina. I encouraged increased physical activity and weight loss. LDL target 70. Hemoglobin A1c less than 6.5. 2. 2 g sodium diet and weight loss have dictated. 3. LDL target less than 70. 4. I encouraged C Pap use.    Medication Adjustments/Labs and Tests Ordered: Current medicines are reviewed at length with the patient today.  Concerns regarding medicines are outlined above.  Medication changes, Labs and Tests ordered today are listed in the Patient Instructions below. Patient Instructions  Medication Instructions:  None  Labwork: None  Testing/Procedures: None  Follow-Up: Your physician wants you to follow-up in: 1 year with Dr. Katrinka BlazingSmith.  You will receive a reminder letter in the mail two months in advance. If you don't receive a letter, please call our office to schedule the follow-up appointment.   Any Other Special Instructions Will Be Listed Below (If Applicable).  Your physician discussed the importance of regular exercise and recommended that you start or continue a regular exercise program for good health.   If you need a refill on your cardiac medications before your next appointment, please call your pharmacy.      Signed, Lesleigh NoeHenry W Christy III, MD  02/28/2016 9:51 AM    [facility] [address], [place], [place]  [address] Phone: [phone]; Fax: [phone]

## 2016-02-29 ENCOUNTER — Other Ambulatory Visit: Payer: Self-pay | Admitting: Interventional Cardiology

## 2016-03-09 ENCOUNTER — Other Ambulatory Visit: Payer: Self-pay | Admitting: Interventional Cardiology

## 2016-03-09 ENCOUNTER — Other Ambulatory Visit: Payer: Self-pay | Admitting: Podiatry

## 2016-03-10 ENCOUNTER — Other Ambulatory Visit: Payer: Self-pay | Admitting: Podiatry

## 2016-03-10 ENCOUNTER — Telehealth: Payer: Self-pay | Admitting: *Deleted

## 2016-03-10 NOTE — Telephone Encounter (Signed)
Called Lyrica 150mg  #60 one capsule bid [facility]

## 2016-04-10 ENCOUNTER — Encounter: Payer: Self-pay | Admitting: *Deleted

## 2016-05-07 ENCOUNTER — Other Ambulatory Visit: Payer: Self-pay | Admitting: Podiatry

## 2016-05-26 ENCOUNTER — Ambulatory Visit: Payer: Medicare Other | Admitting: Podiatry

## 2016-06-26 ENCOUNTER — Encounter: Payer: Self-pay | Admitting: Podiatry

## 2016-06-26 ENCOUNTER — Ambulatory Visit (INDEPENDENT_AMBULATORY_CARE_PROVIDER_SITE_OTHER): Payer: Medicare Other | Admitting: Podiatry

## 2016-06-26 DIAGNOSIS — M79676 Pain in unspecified toe(s): Secondary | ICD-10-CM | POA: Diagnosis not present

## 2016-06-26 DIAGNOSIS — B351 Tinea unguium: Secondary | ICD-10-CM | POA: Diagnosis not present

## 2016-06-26 DIAGNOSIS — G629 Polyneuropathy, unspecified: Secondary | ICD-10-CM

## 2016-06-26 DIAGNOSIS — E1140 Type 2 diabetes mellitus with diabetic neuropathy, unspecified: Secondary | ICD-10-CM

## 2016-06-26 NOTE — Progress Notes (Signed)
Patient ID: [name], male   DOB: [birth year], 61 y.o.   MRN: [id number]  Subjective: 60-y.o.-year-old male returns the office today for painful, elongated, thickened toenails which he cannot trim himself. Denies any redness or drainage around the nails. Denies any acute changes since last appointment and no new complaints today. Denies any systemic complaints such as fevers, chills, nausea, vomiting.   Objective: AAO 3, NAD DP/PT pulses palpable, CRT less than 3 seconds Protective sensation decreased with Simms Weinstein monofilament Nails hypertrophic, dystrophic, elongated, brittle, discolored 10. There is tenderness overlying the nails 1-5 bilaterally. There is no surrounding erythema or drainage along the nail sites. No open lesions or other pre-ulcerative lesions are identified. Currently no pain along the course or insertion of the achilles tendon.  No other areas of tenderness bilateral lower extremities. No overlying edema, erythema, increased warmth. No pain with calf compression, swelling, warmth, erythema.  Assessment: Patient presents with symptomatic onychomycosis  Plan: -Treatment options including alternatives, risks, complications were discussed -Nails sharply debrided 10 ithout complication/bleeding. -Discussed daily foot inspection. If there are any changes, to call the office immediately.  -Did not need any refills of medication.  -Follow-up in 3 months or sooner if any problems are to arise. In the meantime, encouraged to call the office with any questions, concerns, changes symptoms.  Ovid CurdMatthew Addalee Kavanagh, DPM

## 2016-07-04 ENCOUNTER — Other Ambulatory Visit: Payer: Self-pay | Admitting: *Deleted

## 2016-07-04 MED ORDER — COLCHICINE 0.6 MG PO TABS: 0.6000 mg | ORAL_TABLET | Freq: Every day | ORAL | 0 refills | Status: DC

## 2016-07-07 ENCOUNTER — Encounter: Payer: Self-pay | Admitting: Podiatry

## 2016-07-07 ENCOUNTER — Ambulatory Visit (INDEPENDENT_AMBULATORY_CARE_PROVIDER_SITE_OTHER): Payer: Medicare Other | Admitting: Podiatry

## 2016-07-07 ENCOUNTER — Ambulatory Visit (INDEPENDENT_AMBULATORY_CARE_PROVIDER_SITE_OTHER): Payer: Medicare Other

## 2016-07-07 VITALS — BP 108/58 | HR 65

## 2016-07-07 DIAGNOSIS — M79673 Pain in unspecified foot: Secondary | ICD-10-CM | POA: Diagnosis not present

## 2016-07-07 DIAGNOSIS — R52 Pain, unspecified: Secondary | ICD-10-CM

## 2016-07-07 DIAGNOSIS — S92324A Nondisplaced fracture of second metatarsal bone, right foot, initial encounter for closed fracture: Secondary | ICD-10-CM | POA: Diagnosis not present

## 2016-07-07 DIAGNOSIS — E1140 Type 2 diabetes mellitus with diabetic neuropathy, unspecified: Secondary | ICD-10-CM | POA: Diagnosis not present

## 2016-07-07 NOTE — Progress Notes (Signed)
Subjective: 61 year old male presents the office today for concerns of his right foot becoming swollen and painful started on Friday. He denies any recent injury or trauma. He states that he thought this was gout he called and we started him on gout medication on Friday but he has not noticed any improvement in his symptoms. He states the foot is painful when he walks he denies any redness or warmth. Denies any systemic complaints such as fevers, chills, nausea, vomiting. No acute changes since last appointment, and no other complaints at this time.   Objective: AAO x3, NAD DP/PT pulses palpable bilaterally, CRT less than 3 seconds There is moderate edema to the right foot however there is no areas the severity pain. There is no overlying erythema or increase in warmth. Upon palpation of the midfoot he states he gets some mild discomfort however given neuropathy he states the pain is minimal but normally he does not have pain. There is no open sores or pre-ulcerative lesions. There is no pain with calf compression, swelling, warmth, erythema.  Assessment: 61 year old male right second metatarsal fracture  Plan: -Treatment options discussed including all alternatives, risks, and complications -Etiology of symptoms were discussed -X-rays were obtained and reviewed with the patient. There appears being oblique fracture the second metatarsal base and there is a question the fourth metatarsal. -I dispensed a surgical shoe for ROM house and he has a cam walker at home and anchors into was all time. He cannot drive in the boot. -Elevation. -Follow-up in 2 weeks or sooner if any problems arise. In the meantime, encouraged to call the office with any questions, concerns, change in symptoms.   *x-ray next appointment   Levi CurdMatthew Carliyah [name], DPM

## 2016-07-10 ENCOUNTER — Ambulatory Visit: Payer: Medicare Other | Admitting: Podiatry

## 2016-07-21 ENCOUNTER — Ambulatory Visit (INDEPENDENT_AMBULATORY_CARE_PROVIDER_SITE_OTHER): Payer: Medicare Other | Admitting: Podiatry

## 2016-07-21 ENCOUNTER — Ambulatory Visit (INDEPENDENT_AMBULATORY_CARE_PROVIDER_SITE_OTHER): Payer: Medicare Other

## 2016-07-21 ENCOUNTER — Encounter: Payer: Self-pay | Admitting: Podiatry

## 2016-07-21 DIAGNOSIS — S92324D Nondisplaced fracture of second metatarsal bone, right foot, subsequent encounter for fracture with routine healing: Secondary | ICD-10-CM

## 2016-07-21 DIAGNOSIS — S92324A Nondisplaced fracture of second metatarsal bone, right foot, initial encounter for closed fracture: Secondary | ICD-10-CM

## 2016-07-23 NOTE — Progress Notes (Signed)
Subjective: [name] presents the also follow up evaluation of right foot second metatarsal fracture. He states that his pain is improved. He is not able to wear the CAM boot given her activity level and as he cares for his wife but he does with a surgical shoe. Overall he feels that he is doing better. Denies any systemic complaints such as fevers, chills, nausea, vomiting. No acute changes since last appointment, and no other complaints at this time.   Objective: AAO x3, NAD DP/PT pulses palpable bilaterally, CRT less than 3 seconds t this time there is no tenderness palpation of the foot although he does have significant neuropathy. There does appear to be continued some mild edema to the dorsal aspect of the right foot but there is no erythema or increase in warmth. There is no other areas of tenderness identified bilaterally. No open lesions or pre-ulcerative lesions.  No pain with calf compression, swelling, warmth, erythema  Assessment: Right foot second Metatarsal base fracture  Plan: -All treatment options discussed with the patient including all alternatives, risks, complications.  -X-rays were obtained and reviewed. Radiolucent line is still evident the base of the second metatarsal consistent with a fracture. There is no malalignment and there is no significant Lisfranc instability. -A continue with surgical shoe. -Ice and elevate and recommended to limit activity. -follow-up in 4 weeks or sooner if needed. -Patient encouraged to call the office with any questions, concerns, change in symptoms.   Ovid Curd, DPM

## 2016-08-21 ENCOUNTER — Ambulatory Visit: Payer: Medicare Other

## 2016-08-21 ENCOUNTER — Ambulatory Visit (INDEPENDENT_AMBULATORY_CARE_PROVIDER_SITE_OTHER): Payer: Medicare Other | Admitting: Podiatry

## 2016-08-21 ENCOUNTER — Encounter: Payer: Self-pay | Admitting: Podiatry

## 2016-08-21 DIAGNOSIS — S92324D Nondisplaced fracture of second metatarsal bone, right foot, subsequent encounter for fracture with routine healing: Secondary | ICD-10-CM | POA: Diagnosis not present

## 2016-08-21 DIAGNOSIS — M79676 Pain in unspecified toe(s): Secondary | ICD-10-CM | POA: Diagnosis not present

## 2016-08-21 DIAGNOSIS — B351 Tinea unguium: Secondary | ICD-10-CM

## 2016-08-25 NOTE — Progress Notes (Signed)
Subjective: [name] presents the also follow up evaluation of right foot second metatarsal fracture. He states he is doing better. He gets some intermittent discomfort if he is standing in his feet for too long. He subsequently some intermittent swelling. Overall he feels he is doing better.  Also states his nails are thick, painful, elongated cannot trim them himself. Denies any swelling or redness.  Denies any systemic complaints such as fevers, chills, nausea, vomiting. No acute changes since last appointment, and no other complaints at this time.   Objective: AAO x3, NAD DP/PT pulses palpable bilaterally, CRT less than 3 seconds At this time there is no tenderness palpation of the foot although he does have significant neuropathy. There is mild residual swelling to the right foot there is no area pinpoint tenderness identified today. There is no erythema or increase in warmth. Nails are hypertrophic, dystrophic, brittle, discolored, elongated 10. No surrounding redness or drainage. Tenderness nails 1-5 bilaterally. No open lesions or pre-ulcerative lesions are identified today. No pain with calf compression, swelling, warmth, erythema  Assessment: Right foot second Metatarsal base fracture; symptomatic onychomycosis with neuropathy  Plan: -All treatment options discussed with the patient including all alternatives, risks, complications.  -X-rays were obtained and reviewed. Radiolucent line is still evident the base of the second metatarsal consistent with a fracture. There does appear to be increased consolidation. No other evidence of acute fracture. -He is to continue with surgical shoe for next week. In 1 week inserts transition to regular shoe as tolerated. I'll see him back in 2 weeks to see how he is doing. X-ray next appointment.  -Nails sharply debrided 10 without competitions of bleeding. -Daily foot inspection -RTC 2 weeks or sooner if needed.  Ovid CurdMatthew Wagoner, DPM

## 2016-09-05 ENCOUNTER — Ambulatory Visit (INDEPENDENT_AMBULATORY_CARE_PROVIDER_SITE_OTHER): Payer: Medicare Other | Admitting: Podiatry

## 2016-09-05 ENCOUNTER — Encounter: Payer: Self-pay | Admitting: Podiatry

## 2016-09-05 ENCOUNTER — Ambulatory Visit (INDEPENDENT_AMBULATORY_CARE_PROVIDER_SITE_OTHER): Payer: Medicare Other

## 2016-09-05 DIAGNOSIS — S92324D Nondisplaced fracture of second metatarsal bone, right foot, subsequent encounter for fracture with routine healing: Secondary | ICD-10-CM

## 2016-09-05 DIAGNOSIS — M2141 Flat foot [pes planus] (acquired), right foot: Secondary | ICD-10-CM | POA: Diagnosis not present

## 2016-09-05 DIAGNOSIS — M2142 Flat foot [pes planus] (acquired), left foot: Secondary | ICD-10-CM

## 2016-09-05 DIAGNOSIS — M7661 Achilles tendinitis, right leg: Secondary | ICD-10-CM | POA: Diagnosis not present

## 2016-09-05 DIAGNOSIS — G629 Polyneuropathy, unspecified: Secondary | ICD-10-CM | POA: Diagnosis not present

## 2016-09-09 NOTE — Progress Notes (Signed)
Subjective: [name] presents the also follow up evaluation of right foot second metatarsal fracture. He states that overall he is doing much better. He still no some mild swelling but he has not had any pain. He's been wearing a regular shoe the last week without any problems.  Past the states his orthotics are worn out and possibly needs new ones.  Denies any systemic complaints such as fevers, chills, nausea, vomiting. No acute changes since last appointment, and no other complaints at this time.   Objective: AAO x3, NAD DP/PT pulses palpable bilaterally, CRT less than 3 seconds At this time there is no tenderness palpation of the foot. There is mild swelling to the foot there is no erythema or increase in warmth. There is no other area tenderness in the midfoot bilateral lower extremity. There is a significant decrease in medial arch height upon weightbearing. Mild equinus is present. Ankle range of motion intact however STJ ROM is decreased.  No pain with calf compression, swelling, warmth, erythema  Assessment: Right foot second Metatarsal base fracture; flatfoot/osteoarthritis with history of achilles tendonitis  Plan: -All treatment options discussed with the patient including all alternatives, risks, complications.  -X-rays were obtained and reviewed. Radiolucent line is still evident the base of the second metatarsal consistent with a fracture however there is increased consolidation. No other evidence of acute fracture. -Continue regular shoe gear. -Orthotics are worn out and requesting new inserts. He was more molded for new inserts today and they percent to [facility]. Once these, and he is also to bring back an old pair frustrated refer pressure. -Follow-up in 3 days to pick up inserts or sooner if needed.  Ovid CurdMatthew Wagoner, DPM

## 2016-09-25 ENCOUNTER — Ambulatory Visit: Payer: Medicare Other | Admitting: Podiatry

## 2016-09-26 ENCOUNTER — Ambulatory Visit: Payer: Medicare Other

## 2016-09-26 DIAGNOSIS — M7661 Achilles tendinitis, right leg: Secondary | ICD-10-CM

## 2016-09-30 NOTE — Progress Notes (Signed)
Patient presents for orthotic pick up.  Verbal and written break in and wear instructions given.  Patient will follow up in 4 weeks if symptoms worsen or fail to improve. 

## 2016-10-01 DIAGNOSIS — M7661 Achilles tendinitis, right leg: Secondary | ICD-10-CM

## 2016-10-20 ENCOUNTER — Encounter: Payer: Self-pay | Admitting: Podiatry

## 2016-10-20 ENCOUNTER — Ambulatory Visit (INDEPENDENT_AMBULATORY_CARE_PROVIDER_SITE_OTHER): Payer: Medicare Other | Admitting: Podiatry

## 2016-10-20 ENCOUNTER — Ambulatory Visit (INDEPENDENT_AMBULATORY_CARE_PROVIDER_SITE_OTHER): Payer: Medicare Other

## 2016-10-20 DIAGNOSIS — B351 Tinea unguium: Secondary | ICD-10-CM | POA: Diagnosis not present

## 2016-10-20 DIAGNOSIS — R52 Pain, unspecified: Secondary | ICD-10-CM

## 2016-10-20 DIAGNOSIS — M7661 Achilles tendinitis, right leg: Secondary | ICD-10-CM | POA: Diagnosis not present

## 2016-10-20 DIAGNOSIS — M79676 Pain in unspecified toe(s): Secondary | ICD-10-CM | POA: Diagnosis not present

## 2016-10-20 DIAGNOSIS — M779 Enthesopathy, unspecified: Secondary | ICD-10-CM

## 2016-10-20 DIAGNOSIS — G629 Polyneuropathy, unspecified: Secondary | ICD-10-CM

## 2016-10-20 DIAGNOSIS — S92324D Nondisplaced fracture of second metatarsal bone, right foot, subsequent encounter for fracture with routine healing: Secondary | ICD-10-CM

## 2016-10-20 NOTE — Patient Instructions (Signed)
Peroneal Tendinopathy Rehab  Ask your health care provider which exercises are safe for you. Do exercises exactly as told by your health care provider and adjust them as directed. It is normal to feel mild stretching, pulling, tightness, or discomfort as you do these exercises, but you should stop right away if you feel sudden pain or your pain gets worse.Do not begin these exercises until told by your health care provider.  Stretching and range of motion exercises  These exercises warm up your muscles and joints and improve the movement and flexibility of your ankle. These exercises also help to relieve pain and stiffness.  Exercise A: Gastroc and soleus, standing  1. Stand on the edge of a step on the balls of your feet. The ball of your foot is on the walking surface, right under your toes.  2. Hold onto the railing for balance.  3. Slowly lift your left / right foot, allowing your body weight to press your left / right heel down over the edge of the step. You should feel a stretch in your left / right calf.  4. Hold this position for __________ seconds.  Repeat __________ times with your left / right knee straight and __________ times with your left / right knee bent. Complete this stretch __________ times per day.  Strengthening exercises  These exercises improve the strength and endurance of your foot and ankle. Endurance is the ability to use your muscles for a long time, even after they get tired.  Exercise B: Dorsiflexors    1. Secure a rubber exercise band or tube to an object, like a table leg, that will not move if it is pulled on.  2. Secure the other end of the band around your left / right foot.  3. Sit on the floor, facing the object with your left / right foot extended. The band or tube should be slightly tense when your foot is relaxed.  4. Slowly flex your left / right ankle and toes to bring your foot toward you.  5. Hold this position for __________ seconds.  6. Slowly return your foot to the  starting position.  Repeat __________ times. Complete this exercise __________ times per day.  Exercise C: Evertors  1. Sit on the floor with your legs straight out in front of you.  2. Loop a rubber exercise or band or tube around the ball of your left / right foot. The ball of your foot is on the walking surface, right under your toes.  3. Hold the ends of the band in your hands, or secure the band to a stable object.  4. Slowly push your foot outward, away from your other leg.  5. Hold this position for __________ seconds.  6. Slowly return your foot to the starting position.  Repeat __________ times. Complete this exercise __________ times per day.  Exercise D: Standing heel raise (  plantar flexion)  1. Stand with your feet shoulder-width apart with the balls of your feet on a step. The ball of your foot is on the walking surface, right under your toes.  2. Keep your weight spread evenly over the width of your feet while you rise up on your toes. Use a wall or railing to steady yourself, but try not to use it for support.  3. If this exercise is too easy, try these options:  ? Shift your weight toward your left / right leg until you feel challenged.  ? If told by   your health care provider, stand on your left / right leg only.  4. Hold this position for __________ seconds.  Repeat __________ times. Complete this exercise __________ times per day.  Exercise E: Single leg stand  1. Without shoes, stand near a railing or in a doorway. You may hold onto the railing or door frame as needed.  2. Stand on your left / right foot. Keep your big toe down on the floor and try to keep your arch lifted.  ? Do not roll to the outside of your foot.  ? If this exercise is too easy, you can try it with your eyes closed or while standing on a pillow.  3. Hold this position for __________ seconds.  Repeat __________ times. Complete this exercise __________ times per day.  This information is not intended to replace advice given to  you by your health care provider. Make sure you discuss any questions you have with your health care provider.  Document Released: 03/31/2005 Document Revised: 12/06/2015 Document Reviewed: 02/17/2015  [employer]vier Interactive Patient Education  2018 [employer]

## 2016-10-23 NOTE — Progress Notes (Signed)
Subjective: 61 y.o. returns the office today for painful, elongated, thickened toenails which he cannot trim himself. Denies any redness or drainage around the nails. He states he was having some pain in the right foot last week and he points the outside aspect of the foot where he had the pain. It is only present for short time. Denies any recent injury or trauma. Denies any acute changes since last appointment and no new complaints today. Denies any systemic complaints such as fevers, chills, nausea, vomiting.   Objective: AAO 3, NAD DP/PT pulses palpable, CRT less than 3 seconds Protective sensation decreased with Simms Weinstein monofilament Nails hypertrophic, dystrophic, elongated, brittle, discolored 10. There is tenderness overlying the nails 1-5 bilaterally. There is no surrounding erythema or drainage along the nail sites. There is mild swelling to the right upper there is no erythema or increase in warmth. There is no tenderness on the second metatarsal base. There is no specific area pinpoint bony tenderness or pain the vibratory sensation today. There is some mild discomfort at the fifth metatarsal base and along the insertion of the peroneal tendon. No other area of tenderness than for this time. No open lesions or pre-ulcerative lesions are identified. No other areas of tenderness bilateral lower extremities. No overlying edema, erythema, increased warmth. No pain with calf compression, swelling, warmth, erythema.  Assessment: Patient presents with symptomatic onychomycosis; tendinitis; resolving fracture second metatarsal  Plan: -Treatment options including alternatives, risks, complications were discussed -X-ray was obtained reviewed. Healing fracture present second metatarsal. Is no other evidence of acute fracture to verify at this time. Discussed stretching exercises for peroneal tendinitis and continue orthotics that she can modifications. This is not resolved without couple  weeks recommend follow-up. -Nails sharply debrided 10 without complication/bleeding. -Discussed daily foot inspection. If there are any changes, to call the office immediately.  -Follow-up in 3 months or sooner if any problems are to arise. In the meantime, encouraged to call the office with any questions, concerns, changes symptoms.  Ovid CurdMatthew Roneka Gilpin, DPM

## 2016-11-03 ENCOUNTER — Ambulatory Visit: Payer: Medicare Other | Admitting: Podiatry

## 2016-11-10 ENCOUNTER — Telehealth: Payer: Self-pay | Admitting: *Deleted

## 2016-11-10 MED ORDER — NONFORMULARY OR COMPOUNDED ITEM: 3 refills | Status: DC

## 2016-11-10 NOTE — Telephone Encounter (Signed)
[facility] pain substitute cream. Dr. Bary CastillaWagoner okayed of refill prn one year.

## 2016-11-28 ENCOUNTER — Other Ambulatory Visit: Payer: Self-pay | Admitting: *Deleted

## 2016-11-28 DIAGNOSIS — I10 Essential (primary) hypertension: Secondary | ICD-10-CM

## 2016-11-28 DIAGNOSIS — I2510 Atherosclerotic heart disease of native coronary artery without angina pectoris: Secondary | ICD-10-CM

## 2016-11-28 MED ORDER — NITROGLYCERIN 0.4 MG SL SUBL: 0.4000 mg | SUBLINGUAL_TABLET | SUBLINGUAL | 3 refills | Status: DC | PRN

## 2017-01-22 ENCOUNTER — Encounter: Payer: Self-pay | Admitting: Podiatry

## 2017-01-22 ENCOUNTER — Ambulatory Visit (INDEPENDENT_AMBULATORY_CARE_PROVIDER_SITE_OTHER): Payer: Medicare Other | Admitting: Podiatry

## 2017-01-22 DIAGNOSIS — M79676 Pain in unspecified toe(s): Secondary | ICD-10-CM | POA: Diagnosis not present

## 2017-01-22 DIAGNOSIS — B351 Tinea unguium: Secondary | ICD-10-CM

## 2017-01-22 MED ORDER — CAPSAICIN 0.025 % EX CREA: TOPICAL_CREAM | Freq: Two times a day (BID) | CUTANEOUS | 0 refills | Status: DC

## 2017-01-22 NOTE — Progress Notes (Signed)
Subjective: 61 y.o. returns the office today for painful, elongated, thickened toenails which he cannot trim himself. Denies any redness or drainage around the nails. He states he's been getting some more sharp pains to his feet. He states the pain in his expansions of the fracture did resolve this is a new issue. He's been on Lyrica 150 mg twice a day for several years. He is also been using Biofreeze as well as a topical neuropathy cream I ordered for him previously. Denies any recent injury or trauma. Denies any acute changes since last appointment and no new complaints today. Denies any systemic complaints such as fevers, chills, nausea, vomiting.   He states that his blood sugar has been "terrible". He is trying to lay off on [facility] and cookies.   Objective: AAO 3, NAD DP/PT pulses palpable, CRT less than 3 seconds Protective sensation decreased with Simms Weinstein monofilament; he is having subjective increase in sharp, A type pains to his feet most of the right side more than the left however it is bilaterally Nails hypertrophic, dystrophic, elongated, brittle, discolored 10. There is tenderness overlying the nails 1-5 bilaterally. There is no surrounding erythema or drainage along the nail sites. There is no area pinpoint bony tenderness or pain the vibratory sensation to the right foot. He does appear to be some mild swelling to the right. There is no erythema or increase in warmth. No open lesions or pre-ulcerative lesions are identified. No other areas of tenderness bilateral lower extremities. No overlying edema, erythema, increased warmth. No pain with calf compression, swelling, warmth, erythema.  Assessment: Patient presents with symptomatic onychomycosis; neuropathy  Plan: -Treatment options including alternatives, risks, complications were discussed -Nails sharply debrided 10 without complication/bleeding. -I prescribed capsicin cream to see if this will help. I think  that some of this pain is also likely coming from arthritis and his significant flatfoot. Discussed shoe changes.  -Discussed daily foot inspection. If there are any changes, to call the office immediately.  -Follow-up in 3 months or sooner if any problems are to arise. In the meantime, encouraged to call the office with any questions, concerns, changes symptoms.  Ovid Curd, DPM

## 2017-03-11 ENCOUNTER — Encounter: Payer: Self-pay | Admitting: Interventional Cardiology

## 2017-03-13 ENCOUNTER — Other Ambulatory Visit: Payer: Self-pay | Admitting: Interventional Cardiology

## 2017-03-16 ENCOUNTER — Other Ambulatory Visit: Payer: Self-pay

## 2017-03-16 MED ORDER — PREGABALIN 150 MG PO CAPS: 150.0000 mg | ORAL_CAPSULE | Freq: Two times a day (BID) | ORAL | 2 refills | Status: DC

## 2017-03-18 NOTE — Progress Notes (Signed)
Cardiology Office Note    Date:  03/19/2017   ID:  Levi BarthDennis K Douglas, DOB November 09, 1955, MRN [id number]  PCP:  Merlene LaughterStoneking, Hal, MD  Cardiologist: Lesleigh NoeHenry W Kirksey III, MD   Chief Complaint  Patient presents with  . Coronary Artery Disease    History of Present Illness:  [name] is a 61 y.o. male who presents for CAD, known total occlusion of the right coronary with left-to-right collaterals by cath in 2008, hypertension, sleep apnea, morbid obesity, and hyperlipidemia    Denies angina.  Increasingly having to work with his wife and mother-in-law because of physical dependency on him before doctor's visits, ambulation, and overall care.  He has been started on Metformin for diabetes control.  He denies chest pain.  He denies orthopnea and PND.  No neurological complaints.  Denies headaches.  No bleeding on his current medical regimen.  Heavy walking and excessive physical activity causes chest discomfort and dyspnea.  There is been no change in pattern over the past year.  He understands when to slow down and stop.   Past Medical History:  Diagnosis Date  . Diabetes mellitus without complication (HCC)   . Hypertension     History reviewed. No pertinent surgical history.  Current Medications: Outpatient Medications Prior to Visit  Medication Sig Dispense Refill  . acetaminophen (TYLENOL) 650 MG CR tablet Take 1,300 mg by mouth at bedtime.    Marland Kitchen. aspirin 325 MG tablet Take 325 mg by mouth daily.    Marland Kitchen. atorvastatin (LIPITOR) 80 MG tablet Take 1 tab daily    . capsaicin (ZOSTRIX) 0.025 % cream Apply topically 2 (two) times daily. Take as directed 60 g 0  . celecoxib (CELEBREX) 200 MG capsule TAKE 1 CAPSULE BY MOUTH EVERY DAY 30 capsule 11  . Cholecalciferol (VITAMIN D-3 PO) Take by mouth. 2000iu 1 tab daily    . Glucosamine-Chondroitin (COSAMIN DS PO) Take by mouth. Take 2 tabs daily    . isosorbide mononitrate (IMDUR) 120 MG 24 hr tablet TAKE 1 TABLET(120 MG) BY MOUTH DAILY 90 tablet  0  . l-methylfolate-B6-B12 (METANX) 3-35-2 MG TABS tablet One tablet by mouth two times daily. 180 tablet 3  . lisinopril (PRINIVIL,ZESTRIL) 5 MG tablet Take 1 tab daily    . metFORMIN (GLUCOPHAGE-XR) 500 MG 24 hr tablet TK 1 T PO QD WITH MEALS  5  . metoprolol (LOPRESSOR) 50 MG tablet TAKE ONE TABLET BY MOUTH TWICE DAILY 180 tablet 3  . Misc Natural Products (WHITE WILLOW BARK PO) Take by mouth. 400mg  1 tab daily    . Multiple Vitamin (MULTIVITAMIN) tablet Take 1 tablet by mouth daily.    . nitroGLYCERIN (NITROSTAT) 0.4 MG SL tablet Place 1 tablet (0.4 mg total) under the tongue every 5 (five) minutes x 3 doses as needed for chest pain. 25 tablet 3  . NONFORMULARY OR COMPOUNDED ITEM [facility] compound:  Peripheral Neuropathy Cream - Bupivacaine 1%, Doxepin 3%, Gabapentin 6%, Pentoxifylline 3%, Topiramate 1% dispense 120 gram, apply 1-2 grams to affected area 3-4 times daily, +2refills 120 each 2  . NONFORMULARY OR COMPOUNDED ITEM [facility]:  Authorized Substitute Pain Cream - Ibuprofen 15%, Baclofen 1%, Gabapentin 3%, Lidocaine 2%, apply 1-2 grams to affected area 3-4 times daily, +3Refills. 120 each 3  . omeprazole (PRILOSEC) 20 MG capsule Take 20 mg by mouth daily.     Marland Kitchen. OVER THE COUNTER MEDICATION Take 1 tablet by mouth daily. CURAMIN    . OVER THE COUNTER MEDICATION  Take 2 capsules by mouth daily. COSAMIN DS    . pregabalin (LYRICA) 150 MG capsule Take 1 capsule (150 mg total) by mouth 2 (two) times daily. 60 capsule 2  . triamterene-hydrochlorothiazide (MAXZIDE) 75-50 MG per tablet Take 1/2 tab a day    . WELCHOL 625 MG tablet Take 3 tabs at bedtime    . colchicine 0.6 MG tablet Take 1 tablet (0.6 mg total) by mouth daily. (Patient not taking: Reported on 03/19/2017) 30 tablet 0  . Efinaconazole 10 % SOLN Apply 1 drop topically daily. (Patient not taking: Reported on 03/19/2017) 4 mL 11   No facility-administered medications prior to visit.      Allergies:   Penicillins    Social History   Socioeconomic History  . Marital status: Married    Spouse name: None  . Number of children: None  . Years of education: None  . Highest education level: None  Social Needs  . Financial resource strain: None  . Food insecurity - worry: None  . Food insecurity - inability: None  . Transportation needs - medical: None  . Transportation needs - non-medical: None  Occupational History  . None  Tobacco Use  . Smoking status: Never Smoker  . Smokeless tobacco: Never Used  Substance and Sexual Activity  . Alcohol use: No  . Drug use: No  . Sexual activity: None  Other Topics Concern  . None  Social History Narrative  . None     Family History:  The patient's family history includes Heart attack in his father and mother.   ROS:   Please see the history of present illness.    Uses his CPAP compliantly.  No episodes of syncope. All other systems reviewed and are negative.   PHYSICAL EXAM:   VS:  BP 124/72   Pulse 68   Ht 5\' 10"  (1.778 m)   Wt 293 lb 3.2 oz (133 kg)   BMI 42.07 kg/m    GEN: Well nourished, well developed, in no acute distress  HEENT: normal  Neck: no JVD, carotid bruits, or masses Cardiac: RRR; no murmurs, rubs, or gallops,no edema  Respiratory:  clear to auscultation bilaterally, normal work of breathing GI: soft, nontender, nondistended, + BS MS: no deformity or atrophy  Skin: warm and dry, no rash Neuro:  Alert and Oriented x 3, Strength and sensation are intact Psych: euthymic mood, full affect  Wt Readings from Last 3 Encounters:  03/19/17 293 lb 3.2 oz (133 kg)  02/28/16 294 lb (133.4 kg)  09/25/14 296 lb 12.8 oz (134.6 kg)      Studies/Labs Reviewed:   EKG:  EKG normal sinus rhythm, left axis deviation, left anterior hemiblock.  No change when compared to prior tracings.  Recent Labs: No results found for requested labs within last 8760 hours.   Lipid Panel    Component Value Date/Time   CHOL  03/04/2007 0530     165        ATP III CLASSIFICATION:  <200     mg/dL   Desirable  409-811  mg/dL   Borderline High  >=914    mg/dL   High   TRIG 782 (H) 95/62/1308 0530   HDL 30 (L) 03/04/2007 0530   CHOLHDL 5.5 03/04/2007 0530   VLDL 74 (H) 03/04/2007 0530   LDLCALC  03/04/2007 0530    61        Total Cholesterol/HDL:CHD Risk Coronary Heart Disease Risk Table  Men   Women  1/2 Average Risk   3.4   3.3    Additional studies/ records that were reviewed today include:  None    ASSESSMENT:    1. Coronary artery disease involving native coronary artery of native heart without angina pectoris   2. Essential hypertension   3. Mixed hyperlipidemia      PLAN:  In order of problems listed above:  1. Encouraged aerobic activity.  Use nitroglycerin sublingually if chest discomfort.  Notify us if episodes of chest discomfort. 2. Excellent blood pressure control.  No change in the current regimen. 3. LDL target is less than 70.  Continue current therapy.  Encouraged increase aerobic activity, weight loss, decrease caloric intake, notification of healthcare team if angina.  Clinical follow-up in 1 year.    Medication Adjustments/Labs and Tests Ordered: Current medicines are reviewed at length with the patient today.  Concerns regarding medicines are outlined above.  Medication changes, Labs and Tests ordered today are listed in the Patient Instructions below. Patient Instructions  Medication Instructions:  Your physician recommends that you continue on your current medications as directed. Please refer to the Current Medication list given to you today.  Labwork: None  Testing/Procedures: None  Follow-Up: Your physician wants you to follow-up in: 1 year with Dr. Katrinka BlazingSmith. You will receive a reminder letter in the mail two months in advance. If you don't receive a letter, please call our office to schedule the follow-up appointment.   Any Other Special Instructions Will Be  Listed Below (If Applicable).     If you need a refill on your cardiac medications before your next appointment, please call your pharmacy.      Signed, Lesleigh NoeHenry W Peltzer III, MD  03/19/2017 5:17 PM    [facility] [address], [place], [place]  [address] Phone: [phone]; Fax: [phone]

## 2017-03-19 ENCOUNTER — Encounter: Payer: Self-pay | Admitting: Interventional Cardiology

## 2017-03-19 ENCOUNTER — Ambulatory Visit: Payer: Medicare Other | Admitting: Interventional Cardiology

## 2017-03-19 VITALS — BP 124/72 | HR 68 | Ht 70.0 in | Wt 293.2 lb

## 2017-03-19 DIAGNOSIS — I10 Essential (primary) hypertension: Secondary | ICD-10-CM | POA: Diagnosis not present

## 2017-03-19 DIAGNOSIS — I2510 Atherosclerotic heart disease of native coronary artery without angina pectoris: Secondary | ICD-10-CM

## 2017-03-19 DIAGNOSIS — E782 Mixed hyperlipidemia: Secondary | ICD-10-CM | POA: Diagnosis not present

## 2017-03-19 NOTE — Patient Instructions (Signed)
Medication Instructions:  Your physician recommends that you continue on your current medications as directed. Please refer to the Current Medication list given to you today.   Labwork: None   Testing/Procedures: None   Follow-Up: Your physician wants you to follow-up in 1 year with Dr. Cly. You will receive a reminder letter in the mail two months in advance. If you don't receive a letter, please call our office to schedule the follow-up appointment.   Any Other Special Instructions Will Be Listed Below (If Applicable).     If you need a refill on your cardiac medications before your next appointment, please call your pharmacy.   

## 2017-03-26 ENCOUNTER — Ambulatory Visit: Payer: Medicare Other | Admitting: Podiatry

## 2017-03-31 ENCOUNTER — Encounter: Payer: Self-pay | Admitting: Podiatry

## 2017-03-31 ENCOUNTER — Ambulatory Visit: Payer: Medicare Other | Admitting: Podiatry

## 2017-03-31 ENCOUNTER — Encounter (INDEPENDENT_AMBULATORY_CARE_PROVIDER_SITE_OTHER): Payer: Self-pay

## 2017-03-31 DIAGNOSIS — B351 Tinea unguium: Secondary | ICD-10-CM | POA: Diagnosis not present

## 2017-03-31 DIAGNOSIS — M79676 Pain in unspecified toe(s): Secondary | ICD-10-CM | POA: Diagnosis not present

## 2017-03-31 NOTE — Progress Notes (Signed)
Subjective: 61 y.o. returns the office today for painful, elongated, thickened toenails which he cannot trim himself. Denies any redness or drainage around the nails.He is still taking Lyrica for neuropathy which is helping.  Denies any acute changes since last appointment and no new complaints today. Denies any systemic complaints such as fevers, chills, nausea, vomiting.   PCP: Merlene LaughterStoneking, Hal, MD Objective: AAO 3, NAD DP/PT pulses palpable, CRT less than 3 seconds Protective sensation decreased with Simms Weinstein monofilament, Achilles tendon reflex intact.  Nails hypertrophic, dystrophic, elongated, brittle, discolored 10. There is tenderness overlying the nails 1-5 bilaterally. There is no surrounding erythema or drainage along the nail sites. No open lesions or pre-ulcerative lesions are identified. No other areas of tenderness bilateral lower extremities. No overlying edema, erythema, increased warmth. No pain with calf compression, swelling, warmth, erythema.  Assessment: Patient presents with symptomatic onychomycosis; neuropathy  Plan: -Treatment options including alternatives, risks, complications were discussed -Nails sharply debrided 10 without complication/bleeding. -Discussed daily foot inspection. If there are any changes, to call the office immediately.  -Follow-up in 3 months or sooner if any problems are to arise. In the meantime, encouraged to call the office with any questions, concerns, changes symptoms.  Ovid CurdMatthew Taitum Alms, DPM

## 2017-04-12 ENCOUNTER — Encounter: Payer: Self-pay | Admitting: Podiatry

## 2017-04-13 ENCOUNTER — Ambulatory Visit: Payer: Medicare Other | Admitting: Podiatry

## 2017-04-13 ENCOUNTER — Ambulatory Visit (INDEPENDENT_AMBULATORY_CARE_PROVIDER_SITE_OTHER): Payer: Medicare Other

## 2017-04-13 ENCOUNTER — Other Ambulatory Visit: Payer: Self-pay | Admitting: Podiatry

## 2017-04-13 DIAGNOSIS — M79671 Pain in right foot: Secondary | ICD-10-CM

## 2017-04-13 DIAGNOSIS — M7661 Achilles tendinitis, right leg: Secondary | ICD-10-CM

## 2017-04-13 DIAGNOSIS — M1000 Idiopathic gout, unspecified site: Secondary | ICD-10-CM

## 2017-04-13 DIAGNOSIS — M779 Enthesopathy, unspecified: Secondary | ICD-10-CM

## 2017-04-13 MED ORDER — COLCHICINE 0.6 MG PO TABS: 0.6000 mg | ORAL_TABLET | Freq: Every day | ORAL | 2 refills | Status: DC

## 2017-04-15 ENCOUNTER — Telehealth: Payer: Self-pay | Admitting: *Deleted

## 2017-04-15 ENCOUNTER — Other Ambulatory Visit: Payer: Self-pay | Admitting: Podiatry

## 2017-04-15 NOTE — Telephone Encounter (Signed)
Pt states labs are not at [facility]. Faxed labs orders put in by Dr. Ardelle AntonWagoner to [facility].

## 2017-04-16 ENCOUNTER — Telehealth: Payer: Self-pay | Admitting: *Deleted

## 2017-04-16 LAB — CBC WITH DIFFERENTIAL/PLATELET
BASOS ABS: 47 {cells}/uL (ref 0–200)
Basophils Relative: 0.6 %
EOS ABS: 179 {cells}/uL (ref 15–500)
EOS PCT: 2.3 %
HCT: 43.7 % (ref 38.5–50.0)
Hemoglobin: 14.5 g/dL (ref 13.2–17.1)
LYMPHS ABS: 2044 {cells}/uL (ref 850–3900)
MCH: 29.5 pg (ref 27.0–33.0)
MCHC: 33.2 g/dL (ref 32.0–36.0)
MCV: 88.8 fL (ref 80.0–100.0)
MPV: 9.6 fL (ref 7.5–12.5)
Monocytes Relative: 7.8 %
NEUTROS PCT: 63.1 %
Neutro Abs: 4922 cells/uL (ref 1500–7800)
Platelets: 200 10*3/uL (ref 140–400)
RBC: 4.92 10*6/uL (ref 4.20–5.80)
RDW: 14.2 % (ref 11.0–15.0)
Total Lymphocyte: 26.2 %
WBC: 7.8 10*3/uL (ref 3.8–10.8)
WBCMIX: 608 {cells}/uL (ref 200–950)

## 2017-04-16 LAB — BASIC METABOLIC PANEL WITH GFR
BUN: 21 mg/dL (ref 7–25)
CHLORIDE: 101 mmol/L (ref 98–110)
CO2: 28 mmol/L (ref 20–32)
Calcium: 9.3 mg/dL (ref 8.6–10.3)
Creat: 0.96 mg/dL (ref 0.70–1.25)
GFR, Est African American: 99 mL/min/{1.73_m2} (ref >=60)
GFR, Est Non African American: 86 mL/min/{1.73_m2} (ref >=60)
GLUCOSE: 131 mg/dL — AB (ref 65–99)
POTASSIUM: 4.5 mmol/L (ref 3.5–5.3)
Sodium: 137 mmol/L (ref 135–146)

## 2017-04-16 LAB — URIC ACID: URIC ACID, SERUM: 7.3 mg/dL (ref 4.0–8.0)

## 2017-04-16 NOTE — Telephone Encounter (Signed)
I informed pt of Dr. Gabriel RungWagoner's review of results and pt states he is some better, but the top of the foot still hurts. I offered pt an appt and he states he will just wait at this time.

## 2017-04-16 NOTE — Telephone Encounter (Signed)
-----   Message from Vivi BarrackMatthew R Wagoner, DPM sent at 04/16/2017  7:04 AM EST ----- Glucose is elevated but otherwise labs are normal including uric acid. Can you see how is feeling. If not any better please have him scheduled to come in.

## 2017-04-21 ENCOUNTER — Telehealth: Payer: Self-pay | Admitting: *Deleted

## 2017-04-21 MED ORDER — L-METHYLFOLATE-B6-B12 3-35-2 MG PO TABS: ORAL_TABLET | ORAL | 3 refills | Status: DC

## 2017-04-21 NOTE — Telephone Encounter (Signed)
Pt's dtr states pt's Metanx was not at [facility]. Dr. Ardelle AntonWagoner states order as previously. Orders to [facility].

## 2017-06-11 ENCOUNTER — Other Ambulatory Visit: Payer: Self-pay | Admitting: Interventional Cardiology

## 2017-07-02 ENCOUNTER — Ambulatory Visit: Payer: Medicare Other | Admitting: Podiatry

## 2017-07-02 ENCOUNTER — Encounter: Payer: Self-pay | Admitting: Podiatry

## 2017-07-02 DIAGNOSIS — E1140 Type 2 diabetes mellitus with diabetic neuropathy, unspecified: Secondary | ICD-10-CM | POA: Diagnosis not present

## 2017-07-02 DIAGNOSIS — M79676 Pain in unspecified toe(s): Secondary | ICD-10-CM

## 2017-07-02 DIAGNOSIS — B351 Tinea unguium: Secondary | ICD-10-CM | POA: Diagnosis not present

## 2017-07-02 NOTE — Progress Notes (Signed)
Subjective: 62 y.o. returns the office today for painful, elongated, thickened toenails which he cannot trim himself. Denies any redness or drainage around the nails.Denies any acute changes since last appointment and no new complaints today. Denies any systemic complaints such as fevers, chills, nausea, vomiting.   PCP: Merlene LaughterStoneking, Hal, MD  Objective: AAO 3, NAD DP/PT pulses palpable, CRT less than 3 seconds Protective sensation decreased with Simms Weinstein monofilament Nails hypertrophic, dystrophic, elongated, brittle, discolored 10. There is tenderness overlying the nails 1-5 bilaterally. There is no surrounding erythema or drainage along the nail sites. No open lesions or pre-ulcerative lesions are identified. Flatfoot right >> left No other areas of tenderness bilateral lower extremities. No overlying edema, erythema, increased warmth. No pain with calf compression, swelling, warmth, erythema.  Assessment: Patient presents with symptomatic onychomycosis; neuropathy  Plan: -Treatment options including alternatives, risks, complications were discussed -Nails sharply debrided 10 without complication/bleeding. -Discussed daily foot inspection. If there are any changes, to call the office immediately.  -Follow-up in 3 months or sooner if any problems are to arise. In the meantime, encouraged to call the office with any questions, concerns, changes symptoms.  Ovid CurdMatthew Wagoner, DPM

## 2017-07-14 ENCOUNTER — Other Ambulatory Visit: Payer: Self-pay | Admitting: Podiatry

## 2017-08-11 ENCOUNTER — Other Ambulatory Visit: Payer: Self-pay | Admitting: Podiatry

## 2017-09-23 ENCOUNTER — Telehealth: Payer: Self-pay | Admitting: *Deleted

## 2017-09-23 DIAGNOSIS — G629 Polyneuropathy, unspecified: Secondary | ICD-10-CM

## 2017-09-23 NOTE — Telephone Encounter (Signed)
Orders to [facility] - In-office. 

## 2017-10-01 ENCOUNTER — Ambulatory Visit: Payer: Medicare Other | Admitting: Podiatry

## 2017-10-01 ENCOUNTER — Encounter: Payer: Self-pay | Admitting: Podiatry

## 2017-10-01 DIAGNOSIS — M79676 Pain in unspecified toe(s): Secondary | ICD-10-CM | POA: Diagnosis not present

## 2017-10-01 DIAGNOSIS — E1140 Type 2 diabetes mellitus with diabetic neuropathy, unspecified: Secondary | ICD-10-CM | POA: Diagnosis not present

## 2017-10-01 DIAGNOSIS — B351 Tinea unguium: Secondary | ICD-10-CM

## 2017-10-01 DIAGNOSIS — G629 Polyneuropathy, unspecified: Secondary | ICD-10-CM

## 2017-10-02 NOTE — Progress Notes (Signed)
Subjective: 62 y.o. returns the office today for painful, elongated, thickened toenails which he cannot trim himself. Denies any redness or drainage around the nails.  He has been going to physical therapy for [facility] he has not yet noticed a difference.  They have been doing some rehab exercises which will occasionally his foot sore.  Denies any recent injury.  Denies any acute changes since last appointment and no new complaints today. Denies any systemic complaints such as fevers, chills, nausea, vomiting.   PCP: Merlene LaughterStoneking, Hal, MD  Objective: AAO 3, NAD DP/PT pulses palpable, CRT less than 3 seconds Protective sensation decreased with Simms Weinstein monofilament Nails hypertrophic, dystrophic, elongated, brittle, discolored 10. There is tenderness overlying the nails 1-5 bilaterally. There is no surrounding erythema or drainage along the nail sites. No open lesions or pre-ulcerative lesions are identified. Flatfoot right >> left with prominence of the talar head.  No area pinpoint bony tenderness or pain to vibratory sensation. No other areas of tenderness bilateral lower extremities. No overlying edema, erythema, increased warmth. No pain with calf compression, swelling, warmth, erythema.  Assessment: Patient presents with symptomatic onychomycosis; neuropathy  Plan: -Treatment options including alternatives, risks, complications were discussed -Nails sharply debrided 10 without complication/bleeding. -Continue physical therapy however if the pain increases swelling and also to decrease his activity level. -Discussed daily foot inspection. If there are any changes, to call the office immediately.  -Follow-up in 3 months or sooner if any problems are to arise. In the meantime, encouraged to call the office with any questions, concerns, changes symptoms.  Ovid CurdMatthew Wagoner, DPM

## 2017-10-13 ENCOUNTER — Telehealth: Payer: Self-pay | Admitting: *Deleted

## 2017-10-13 DIAGNOSIS — M79605 Pain in left leg: Secondary | ICD-10-CM

## 2017-10-13 NOTE — Telephone Encounter (Signed)
Dr. Ardelle AntonWagoner referred to Dr. Roda ShuttersXu.

## 2017-10-23 ENCOUNTER — Other Ambulatory Visit (INDEPENDENT_AMBULATORY_CARE_PROVIDER_SITE_OTHER): Payer: Self-pay | Admitting: Physician Assistant

## 2017-10-23 ENCOUNTER — Ambulatory Visit (INDEPENDENT_AMBULATORY_CARE_PROVIDER_SITE_OTHER): Payer: Medicare Other | Admitting: Orthopaedic Surgery

## 2017-10-23 DIAGNOSIS — M25562 Pain in left knee: Principal | ICD-10-CM

## 2017-10-23 DIAGNOSIS — Z6841 Body Mass Index (BMI) 40.0 and over, adult: Secondary | ICD-10-CM

## 2017-10-23 DIAGNOSIS — M1712 Unilateral primary osteoarthritis, left knee: Secondary | ICD-10-CM

## 2017-10-23 DIAGNOSIS — G8929 Other chronic pain: Secondary | ICD-10-CM

## 2017-10-23 NOTE — Progress Notes (Addendum)
Office Visit Note   Patient: [name]           Date of Birth: 07-17-1955           MRN: [id number] Visit Date: 10/23/2017              Requested by: Merlene LaughterStoneking, Hal, MD [address] [place], [place] [address] PCP: Merlene LaughterStoneking, Hal, MD   Assessment & Plan: Visit Diagnoses:  1. Unilateral primary osteoarthritis, left knee   2. Body mass index 40.0-44.9, adult (HCC)   3. Morbid obesity (HCC)     Plan: Impression is a 62 year old gentleman with end-stage left knee degenerative joint disease with varus deformity.  At this point patient has failed extensive conservative treatment and has chronic pain at rest and at night and difficulty with ADLs.  He wishes to undergo a left total knee replacement.  We did discuss the fact that his BMI is slightly increased and the potential implications of being heavier.  We reviewed the potential risks and complications of a total knee replacement.  He voiced understanding wishes to proceed.  He does have a history of heart disease for which we will get preoperative cardiac clearance from Dr. Verdis PrimeHenry Jean.  We will schedule his surgery as soon as we get this clearance from his cardiologist. The patient meets the AMA guidelines for Morbid (severe) obesity with a BMI > 40.0 and I have recommended weight loss.  Follow-Up Instructions: Return if symptoms worsen or fail to improve.   Orders:  No orders of the defined types were placed in this encounter.  No orders of the defined types were placed in this encounter.     Procedures: No procedures performed   Clinical Data: No additional findings.   Subjective: Chief Complaint  Patient presents with  . Left Knee - Pain    [name] is a very pleasant [age] gentleman who comes in with left knee pain at rest and night since this March.  He has had a long history of left knee degenerative joint disease.  He has tried extensive conservative treatment in terms of cortisone injections Visco  supplementation and oral and topical anti-inflammatories.  He has not gotten any significant pain relief from these treatment measures.  He comes in today for further discussion of treatment options.  He was previously seen at another orthopedic clinic.  He comes in today for second opinion.   Review of Systems  Constitutional: Negative.   All other systems reviewed and are negative.    Objective: Vital Signs: There were no vitals taken for this visit.  Physical Exam  Constitutional: He is oriented to person, place, and time. He appears well-developed and well-nourished.  HENT:  Head: Normocephalic and atraumatic.  Eyes: Pupils are equal, round, and reactive to light.  Neck: Neck supple.  Pulmonary/Chest: Effort normal.  Abdominal: Soft.  Musculoskeletal: Normal range of motion.  Neurological: He is alert and oriented to person, place, and time.  Skin: Skin is warm.  Psychiatric: He has a normal mood and affect. His behavior is normal. Judgment and thought content normal.  Nursing note and vitals reviewed.   Ortho Exam Left knee exam shows no joint effusion.  He has a mild varus deformity.  No flexion contracture.  Collaterals and cruciates are stable. Specialty Comments:  No specialty comments available.  Imaging: Left knee x-rays from the outside facility reviewed shows end-stage tricompartmental degenerative joint disease with a mild varus deformity.   PMFS History:  Patient Active Problem List   Diagnosis Date Noted  . Primary gout 01/11/2014  . CAD (coronary artery disease), native coronary artery 07/26/2013  . Essential hypertension 07/26/2013  . Hyperlipidemia 07/26/2013  . Obstructive sleep apnea 07/26/2013   Past Medical History:  Diagnosis Date  . CAD in native artery    cath report from 02/2017 indicates patient had a history of RCA stent >10 years prior to that. At time of that cath he was found to have EF 70%, 20-30% ostial LAD, 30-40% prox LAD, 30-40% Cx,  80-90% Cx beyond OM1, totally occluded RCA with left-to-right collaterals.  . Diabetes mellitus type 2 in obese (HCC)   . Hyperlipidemia   . Hypertension   . Morbid obesity (HCC)     Family History  Problem Relation Age of Onset  . Heart attack Mother   . Heart attack Father     Past Surgical History:  Procedure Laterality Date  . MENISCUS REPAIR     Social History   Occupational History  . Not on file  Tobacco Use  . Smoking status: Never Smoker  . Smokeless tobacco: Never Used  Substance and Sexual Activity  . Alcohol use: No  . Drug use: No  . Sexual activity: Not on file

## 2017-11-03 ENCOUNTER — Telehealth: Payer: Self-pay | Admitting: *Deleted

## 2017-11-03 NOTE — Telephone Encounter (Signed)
   [facility] Pre-operative Risk Assessment    Request for surgical clearance:  1. What type of surgery is being performed? Left total knee arthoplasty   2. When is this surgery scheduled? TBD   3. What type of clearance is required (medical clearance vs. Pharmacy clearance to hold med vs. Both)? medical  4. Are there any medications that need to be held prior to surgery and how long?n/a   5. Practice name and name of physician performing surgery? [facility], Dr Erlinda Hong   6. What is your office phone number [phone]    7.   What is your office fax number [phone]  8.   Anesthesia type (None, local, MAC, general) ? Spinal/adductor canal block   Janan Halter 11/03/2017, 1:35 PM  _________________________________________________________________   (provider comments below)

## 2017-11-05 NOTE — Telephone Encounter (Signed)
Appt made for August 5th @ 2:00 pm with Ronie Spiesayna Dunn

## 2017-11-05 NOTE — Telephone Encounter (Signed)
   Primary Cardiologist:Henry Malissa HippoW Koudelka III, MD  Chart reviewed as part of pre-operative protocol coverage. Because of Telford NabDennis K Hagner's past medical history and time since last visit, he/she will require a follow-up visit in order to better assess preoperative cardiovascular risk.  Pre-op covering staff: - Please schedule appointment and call patient to inform them. - Please contact requesting surgeon's office via preferred method (i.e, phone, fax) to inform them of need for appointment prior to surgery.  Roe RutherfordAngela Nicole Duke, PA  11/05/2017, 2:40 PM

## 2017-11-15 ENCOUNTER — Encounter: Payer: Self-pay | Admitting: Physician Assistant

## 2017-11-15 NOTE — Progress Notes (Signed)
Cardiology Office Note    Date:  11/16/2017  ID:  [name], DOB 1955/12/23, MRN [id number] PCP:  Merlene Laughter, MD  Cardiologist:  Lesleigh Noe, MD   Chief Complaint: pre-op evaluation  History of Present Illness:  [name] is a 62 y.o. male with history of CAD, hypertension, sleep apnea (uses CPAP), morbid obesity, DM, and hyperlipidemia who presents for pre-op evaluation. Records are limited in the chart but last cath report from 02/2007 indicates patient had a history of RCA stent >10 years prior to that. At time of that cath he was found to have EF 70%, 20-30% ostial LAD, 30-40% prox LAD, 30-40% Cx, 80-90% Cx beyond OM1, totally occluded RCA with left-to-right collaterals. This was treated medically. Last Epic labs 04/2017 CBC wnl, Cr 0.96; by [facility] sheet 10/2017 Tchol 159, HDL 36, LDL 62, trig 253, Hgb 14.4, Cr 1.020, K 4.2, ALT 26.  He returns for pre-op cardiovascular evaluation today, last OV 03/2017. He does note chronic unchanged angina, particularly in the evening hours as his meds wear off (generally after 5pm). This is precipitated by exertion such as sweeping the garage or rushing to help his wife who has MS. This resolves with rest and has not changed since cath in 2008. He developed erectile dysfunction after initation of Imdur so has not been keen on the idea of increasing this. He is also on metoprolol. In general he feels like he's had a stable year except progressive L knee pain which is felt to require knee replacement as soon as clearance is obtained.   Past Medical History:  Diagnosis Date  . CAD in native artery    cath report from 02/2017 indicates patient had a history of RCA stent >10 years prior to that. At time of that cath he was found to have EF 70%, 20-30% ostial LAD, 30-40% prox LAD, 30-40% Cx, 80-90% Cx beyond OM1, totally occluded RCA with left-to-right collaterals.  . Diabetes mellitus type 2 in obese (HCC)   . Hyperlipidemia   . Hypertension     . Morbid obesity (HCC)     Past Surgical History:  Procedure Laterality Date  . MENISCUS REPAIR      Current Medications: Current Meds  Medication Sig  . acetaminophen (TYLENOL) 650 MG CR tablet Take 1,300 mg by mouth at bedtime.  Marland Kitchen aspirin 325 MG tablet Take 325 mg by mouth daily.  Marland Kitchen atorvastatin (LIPITOR) 80 MG tablet Take 1 tab daily  . capsaicin (ZOSTRIX) 0.025 % cream Apply topically 2 (two) times daily. Take as directed  . celecoxib (CELEBREX) 200 MG capsule TAKE 1 CAPSULE BY MOUTH EVERY DAY  . Cholecalciferol (VITAMIN D-3 PO) Take by mouth. 2000iu 1 tab daily  . colchicine 0.6 MG tablet Take 1 tablet (0.6 mg total) by mouth daily.  . Glucosamine-Chondroitin (COSAMIN DS PO) Take by mouth. Take 2 tabs daily  . isosorbide mononitrate (IMDUR) 120 MG 24 hr tablet TAKE 1 TABLET(120 MG) BY MOUTH DAILY  . l-methylfolate-B6-B12 (METANX) 3-35-2 MG TABS tablet One tablet by mouth two times daily.  Marland Kitchen lisinopril (PRINIVIL,ZESTRIL) 5 MG tablet Take 1 tab daily  . metFORMIN (GLUCOPHAGE-XR) 500 MG 24 hr tablet TK 1 T PO QD WITH MEALS  . metoprolol (LOPRESSOR) 50 MG tablet TAKE ONE TABLET BY MOUTH TWICE DAILY  . Misc Natural Products (WHITE WILLOW BARK PO) Take by mouth. 400mg  1 tab daily  . Multiple Vitamin (MULTIVITAMIN) tablet Take 1 tablet by mouth daily.  Marland Kitchen  nitroGLYCERIN (NITROSTAT) 0.4 MG SL tablet Place 1 tablet (0.4 mg total) under the tongue every 5 (five) minutes x 3 doses as needed for chest pain.  . NONFORMULARY OR COMPOUNDED ITEM [facility] compound:  Peripheral Neuropathy Cream - Bupivacaine 1%, Doxepin 3%, Gabapentin 6%, Pentoxifylline 3%, Topiramate 1% dispense 120 gram, apply 1-2 grams to affected area 3-4 times daily, +2refills  . NONFORMULARY OR COMPOUNDED ITEM [facility]:  Authorized Substitute Pain Cream - Ibuprofen 15%, Baclofen 1%, Gabapentin 3%, Lidocaine 2%, apply 1-2 grams to affected area 3-4 times daily, +3Refills.  Marland Kitchen omeprazole (PRILOSEC) 20 MG capsule  Take 20 mg by mouth daily.   Marland Kitchen OVER THE COUNTER MEDICATION Take 1 tablet by mouth daily. CURAMIN  . OVER THE COUNTER MEDICATION Take 2 capsules by mouth daily. COSAMIN DS  . pregabalin (LYRICA) 150 MG capsule Take 1 capsule (150 mg total) by mouth 2 (two) times daily.  Marland Kitchen triamterene-hydrochlorothiazide (MAXZIDE) 75-50 MG per tablet Take 1/2 tab a day  . WELCHOL 625 MG tablet Take 3 tabs at bedtime      Allergies:   Penicillins   Social History   Socioeconomic History  . Marital status: Married    Spouse name: Not on file  . Number of children: Not on file  . Years of education: Not on file  . Highest education level: Not on file  Occupational History  . Not on file  Social Needs  . Financial resource strain: Not on file  . Food insecurity:    Worry: Not on file    Inability: Not on file  . Transportation needs:    Medical: Not on file    Non-medical: Not on file  Tobacco Use  . Smoking status: Never Smoker  . Smokeless tobacco: Never Used  Substance and Sexual Activity  . Alcohol use: No  . Drug use: No  . Sexual activity: Not on file  Lifestyle  . Physical activity:    Days per week: Not on file    Minutes per session: Not on file  . Stress: Not on file  Relationships  . Social connections:    Talks on phone: Not on file    Gets together: Not on file    Attends religious service: Not on file    Active member of club or organization: Not on file    Attends meetings of clubs or organizations: Not on file    Relationship status: Not on file  Other Topics Concern  . Not on file  Social History Narrative  . Not on file     Family History:  The patient's family history includes Heart attack in his father and mother.  ROS:   Please see the history of present illness. L knee pain.  All other systems are reviewed and otherwise negative.    PHYSICAL EXAM:   VS:  BP 110/72   Pulse 77   Ht 5\' 10"  (1.778 m)   Wt 292 lb 12.8 oz (132.8 kg)   SpO2 95%   BMI 42.01  kg/m   BMI: Body mass index is 42.01 kg/m. GEN: Well nourished, well developed morbidly obese WM, in no acute distress HEENT: normocephalic, atraumatic Neck: no JVD, carotid bruits, or masses Cardiac: RRR; no murmurs, rubs, or gallops, no edema  Respiratory:  clear to auscultation bilaterally, normal work of breathing GI: soft, nontender, nondistended, + BS MS: no deformity or atrophy Skin: warm and dry, no rash Neuro:  Alert and Oriented x 3, Strength and sensation  are intact, follows commands Psych: euthymic mood, full affect  Wt Readings from Last 3 Encounters:  11/16/17 292 lb 12.8 oz (132.8 kg)  03/19/17 293 lb 3.2 oz (133 kg)  02/28/16 294 lb (133.4 kg)      Studies/Labs Reviewed:   EKG:  EKG was ordered today and personally reviewed by me and demonstrates NSR 77bpm no acute ST T changes  Recent Labs: 04/15/2017: BUN 21; Creat 0.96; Hemoglobin 14.5; Platelets 200; Potassium 4.5; Sodium 137   Lipid Panel    Component Value Date/Time   CHOL  03/04/2007 0530    165        ATP III CLASSIFICATION:  <200     mg/dL   Desirable  [id number]  mg/dL   Borderline High  >=045>=240    mg/dL   High   TRIG 409368 (H) 81/19/147811/20/2008 0530   HDL 30 (L) 03/04/2007 0530   CHOLHDL 5.5 03/04/2007 0530   VLDL 74 (H) 03/04/2007 0530   LDLCALC  03/04/2007 0530    61        Total Cholesterol/HDL:CHD Risk Coronary Heart Disease Risk Table                     Men   Women  1/2 Average Risk   3.4   3.3    Additional studies/ records that were reviewed today include: Summarized above.    ASSESSMENT & PLAN:   1. Pre-op cardiovascular evaluation - revised cardiac risk index is calculated at 0.9% given history of CAD. He has chronic angina which he feels is stable and unchanged from 2008. Given that his last cath was many years ago I will reach out to Dr. Katrinka BlazingSmith for input whether any further cardiovascular testing is necessary. We discussed possibility of med titration today but he shares that Imdur has  caused some erectile dysfunction - he is willing to continue to take but doesn't sound like he's thrilled about increasing. In general he feels he is doing OK from cardiac standpoint. I will also find out from Dr. Katrinka BlazingSmith about a) decreasing ASA to 81mg  daily and b) whether he has any feeling on continuing versus stopping in prep for surgery. I will plan to route clearance over to Dr Warren DanesXu's office as soon as I hear back from Dr. Katrinka BlazingSmith. Clearance indicates he will have Spinal/adductor canal block. He also has OSA which will need to be monitored intraoperatively.  2. CAD with chronic stable angina- as above.  3. Hyperlipidemia - well controlled by recent labs.  4. Essential HTN - well controlled. Follow.  Disposition: F/u with Dr. Katrinka BlazingSmith in 6 months.  Medication Adjustments/Labs and Tests Ordered: Current medicines are reviewed at length with the patient today.  Concerns regarding medicines are outlined above. Medication changes, Labs and Tests ordered today are summarized above and listed in the Patient Instructions accessible in Encounters.   Signed, Laurann Montanaayna N Zenda Herskowitz, PA-C  11/16/2017 2:45 PM    [facility] [address], [place], [place]  [address] Phone: [phone]; Fax: [phone]

## 2017-11-16 ENCOUNTER — Ambulatory Visit: Payer: Medicare Other | Admitting: Physician Assistant

## 2017-11-16 ENCOUNTER — Encounter: Payer: Self-pay | Admitting: Physician Assistant

## 2017-11-16 VITALS — BP 110/72 | HR 77 | Ht 70.0 in | Wt 292.8 lb

## 2017-11-16 DIAGNOSIS — Z01810 Encounter for preprocedural cardiovascular examination: Secondary | ICD-10-CM | POA: Diagnosis not present

## 2017-11-16 DIAGNOSIS — I25118 Atherosclerotic heart disease of native coronary artery with other forms of angina pectoris: Secondary | ICD-10-CM | POA: Diagnosis not present

## 2017-11-16 DIAGNOSIS — I10 Essential (primary) hypertension: Secondary | ICD-10-CM

## 2017-11-16 DIAGNOSIS — E785 Hyperlipidemia, unspecified: Secondary | ICD-10-CM

## 2017-11-16 NOTE — Patient Instructions (Addendum)
Medication Instructions:   Your physician recommends that you continue on your current medications as directed. Please refer to the Current Medication list given to you today.   If you need a refill on your cardiac medications before your next appointment, please call your pharmacy.  Labwork: NONE ORDERED  TODAY    Testing/Procedures: NONE ORDERED  TODAY    Follow-Up: Your physician wants you to follow-up in:  IN  6  MONTHS WITH DR  Peschke You will receive a reminder letter in the mail two months in advance. If you don't receive a letter, please call our office to schedule the follow-up appointment.      Any Other Special Instructions Will Be Listed Below (If Applicable).                                                                                                                                                   

## 2017-11-18 ENCOUNTER — Telehealth: Payer: Self-pay | Admitting: *Deleted

## 2017-11-18 NOTE — Telephone Encounter (Signed)
-----   Message from Laurann Montanaayna N Dunn, [place]-C sent at 11/18/2017  1:33 PM EDT ----- Regarding: Call Can you call [name] and let him know Dr. Katrinka BlazingSmith is in on vacation and is back next week and will review his final chart for pre-op clearance decision? I forwarded message to him but it looks like his delegated colleague would prefer the answer come from Dr. Katrinka BlazingSmith. As soon as I hear back will let him know. Dayna Dunn PA-C

## 2017-11-18 NOTE — Telephone Encounter (Signed)
Spoke with pt re: Cardiac Clearance and let him know, per Ronie Spiesayna Dunn, PA-C, that Dr. Katrinka BlazingSmith is on vacation and back in the office next week and that his chart will be reviewed for final decision if he can be cleared or not. Pt thanked me for the call.

## 2017-11-24 ENCOUNTER — Telehealth: Payer: Self-pay | Admitting: Physician Assistant

## 2017-11-24 DIAGNOSIS — Z01810 Encounter for preprocedural cardiovascular examination: Secondary | ICD-10-CM

## 2017-11-24 DIAGNOSIS — I2510 Atherosclerotic heart disease of native coronary artery without angina pectoris: Secondary | ICD-10-CM

## 2017-11-24 NOTE — Telephone Encounter (Signed)
Call placed to Pt.   Advised per Dr. Katrinka BlazingSmith Pt would need stress test prior to surgery.  Order entered for [employer] my[employer].  Advised Pt would receive phone call to schedule.  Pt indicates understanding.  No questions at this time.

## 2017-11-24 NOTE — Telephone Encounter (Signed)
Please call patient - per Dr. Katrinka BlazingSmith "Needs 2 day nuclear. If high risk will need cath. Will be moderate risk at baseline."  "( If nuclear < high risk will be okay to hold aspirin for 3-5 days and then resume 81 mg daily.)" Please arrange and let patient know - would be lexiscan given his knee issues.   Modest Draeger PA-C

## 2017-12-16 ENCOUNTER — Telehealth (HOSPITAL_COMMUNITY): Payer: Self-pay | Admitting: *Deleted

## 2017-12-16 NOTE — Telephone Encounter (Signed)
Patient given detailed instructions per Myocardial Perfusion Study Information Sheet for the test on 9/919 Patient notified to arrive 15 minutes early and that it is imperative to arrive on time for appointment to keep from having the test rescheduled.  If you need to cancel or reschedule your appointment, please call the office within 24 hours of your appointment. . Patient verbalized understanding [name], [name] Levi    

## 2017-12-21 ENCOUNTER — Ambulatory Visit (HOSPITAL_COMMUNITY): Payer: Medicare Other | Attending: Internal Medicine

## 2017-12-21 DIAGNOSIS — R9439 Abnormal result of other cardiovascular function study: Secondary | ICD-10-CM | POA: Diagnosis not present

## 2017-12-21 DIAGNOSIS — I2510 Atherosclerotic heart disease of native coronary artery without angina pectoris: Secondary | ICD-10-CM | POA: Diagnosis not present

## 2017-12-21 DIAGNOSIS — Z01810 Encounter for preprocedural cardiovascular examination: Secondary | ICD-10-CM

## 2017-12-21 DIAGNOSIS — E119 Type 2 diabetes mellitus without complications: Secondary | ICD-10-CM | POA: Diagnosis not present

## 2017-12-21 DIAGNOSIS — R079 Chest pain, unspecified: Secondary | ICD-10-CM | POA: Diagnosis not present

## 2017-12-21 DIAGNOSIS — I10 Essential (primary) hypertension: Secondary | ICD-10-CM | POA: Insufficient documentation

## 2017-12-21 DIAGNOSIS — R0609 Other forms of dyspnea: Secondary | ICD-10-CM | POA: Diagnosis not present

## 2017-12-21 MED ORDER — REGADENOSON 0.4 MG/5ML IV SOLN
0.4000 mg | Freq: Once | INTRAVENOUS | Status: AC
  Administered 2017-12-21: 0.4 mg via INTRAVENOUS

## 2017-12-21 MED ORDER — TECHNETIUM TC 99M TETROFOSMIN IV KIT
32.6000 | PACK | Freq: Once | INTRAVENOUS | Status: AC | PRN
  Administered 2017-12-21: 32.6 via INTRAVENOUS
  Filled 2017-12-21: qty 33

## 2017-12-22 ENCOUNTER — Ambulatory Visit (HOSPITAL_COMMUNITY): Payer: Medicare Other | Attending: Cardiology

## 2017-12-22 LAB — MYOCARDIAL PERFUSION IMAGING
CHL CUP NUCLEAR SSS: 7
LV dias vol: 121 mL (ref 62–150)
LVSYSVOL: 46 mL
NUC STRESS TID: 0.86
Peak HR: 81 {beats}/min
RATE: 0.4
Rest HR: 66 {beats}/min
SDS: 6
SRS: 1

## 2017-12-22 MED ORDER — TECHNETIUM TC 99M TETROFOSMIN IV KIT
31.4000 | PACK | Freq: Once | INTRAVENOUS | Status: AC | PRN
  Administered 2017-12-22: 31.4 via INTRAVENOUS
  Filled 2017-12-22: qty 32

## 2017-12-24 NOTE — Telephone Encounter (Signed)
   Primary Cardiologist: Lesleigh NoeHenry W Tramble III, MD  Update to prior cardiac clearance. Patient was recently seen in clinic and nuclear stress test was obtained. The result has come back low risk without active ischemia. There is question of diaphragmatic attenuation artifact. He has normal LV function. Per Dr. Katrinka BlazingSmith, this scan is compatible with his known prior inferior MI. In absence of accelerating angina from baseline or acute CHF symptoms, [name] would be at acceptable risk for the planned procedure without further cardiovascular testing.   I will route this recommendation to the requesting party via Epic fax function and remove from pre-op pool.  Please call with questions.  Laurann Montanaayna N Dunn, PA-C 12/24/2017, 3:02 PM

## 2017-12-31 ENCOUNTER — Ambulatory Visit: Payer: Medicare Other | Admitting: Podiatry

## 2018-01-01 ENCOUNTER — Encounter: Payer: Self-pay | Admitting: Podiatry

## 2018-01-01 ENCOUNTER — Ambulatory Visit: Payer: Medicare Other | Admitting: Podiatry

## 2018-01-01 DIAGNOSIS — E1140 Type 2 diabetes mellitus with diabetic neuropathy, unspecified: Secondary | ICD-10-CM | POA: Diagnosis not present

## 2018-01-01 DIAGNOSIS — M79676 Pain in unspecified toe(s): Secondary | ICD-10-CM

## 2018-01-01 DIAGNOSIS — B351 Tinea unguium: Secondary | ICD-10-CM | POA: Diagnosis not present

## 2018-01-03 NOTE — Progress Notes (Signed)
Subjective: 62 y.o. returns the office today for painful, elongated, thickened toenails which he cannot trim himself. Denies any redness or drainage around the nails. Denies any recent injury.  Denies any acute changes since last appointment and no new complaints today. Denies any systemic complaints such as fevers, chills, nausea, vomiting.   PCP: Merlene LaughterStoneking, Hal, MD  Objective: AAO 3, NAD; presents wearing regular shoes.  DP/PT pulses palpable, CRT less than 3 seconds Protective sensation decreased with Simms Weinstein monofilament Nails hypertrophic, dystrophic, elongated, brittle, discolored 10. There is tenderness overlying the nails 1-5 bilaterally. There is no surrounding erythema or drainage along the nail sites. No open lesions or pre-ulcerative lesions are identified. Flatfoot right > left with prominence of the talar head.  No area pinpoint bony tenderness or pain to vibratory sensation. No other areas of tenderness bilateral lower extremities. No overlying edema, erythema, increased warmth. No pain with calf compression, swelling, warmth, erythema.  Assessment: Patient presents with symptomatic onychomycosis; neuropathy  Plan: -Treatment options including alternatives, risks, complications were discussed -Nails sharply debrided 10 without complication/bleeding. -Discussed daily foot inspection. If there are any changes, to call the office immediately.  -Follow-up in 3 months or sooner if any problems are to arise. In the meantime, encouraged to call the office with any questions, concerns, changes symptoms.  Ovid CurdMatthew Wagoner, DPM

## 2018-01-04 ENCOUNTER — Encounter (INDEPENDENT_AMBULATORY_CARE_PROVIDER_SITE_OTHER): Payer: Self-pay | Admitting: Orthopaedic Surgery

## 2018-01-04 NOTE — Telephone Encounter (Signed)
Please call him to schedule surgery.  Thanks.

## 2018-01-20 NOTE — Pre-Procedure Instructions (Signed)
ZEALAND BOYETT  01/20/2018      [id number] [place], [place] - [address] AT [facility] OF [facility] [address] [place] [address] Phone: [phone] Fax: [phone]  [facility] - [place], [place] - [address] [facility] [address] [phone] [facility] [address] [place] [address] Phone: [phone] Fax: [phone]  [facility] - [place], [place] - [address] [facility] [address] [place] [address] Phone: [phone] Fax: [phone]  [facility] [address] [place], [place] - [address] [place] [address] Phone: [phone] Fax: [phone]    Your procedure is scheduled on October 18th.  Report to [facility] Admitting at 10:00 A.M.  Call this number if you have problems the morning of surgery:  [phone]   Remember:  Do not eat or drink after midnight.     Take these medicines the morning of surgery with A SIP OF WATER     Tylenol - if needed  Metoprolol (Lopressor)  Omeprazole (Prilosec)  Pregabalin (Lyrica)  Eye Drops - if needed   7 days prior to surgery STOP taking any Aspirin(unless otherwise instructed by your surgeon), Aleve, Naproxen, Ibuprofen, Motrin, Advil, Goody's, BC's, all herbal medications, fish oil, and all vitamins  Follow your surgeon's instructions on when to stop Asprin.  If no instructions were given by your surgeon then you will need to call the office to get those instructions.     WHAT DO I DO ABOUT MY DIABETES MEDICATION?   Marland Kitchen Do not take oral diabetes medicines (pills) the morning of surgery.  Do not take Metformin the day of surgery   How to Manage Your Diabetes Before and After Surgery  Why is it important to control my blood sugar before and after surgery? . Improving blood sugar levels before and after surgery helps healing and can limit problems. . A way of improving blood sugar control is eating a healthy  diet by: o  Eating less sugar and carbohydrates o  Increasing activity/exercise o  Talking with your doctor about reaching your blood sugar goals . High blood sugars (greater than 180 mg/dL) can raise your risk of infections and slow your recovery, so you will need to focus on controlling your diabetes during the weeks before surgery. . Make sure that the doctor who takes care of your diabetes knows about your planned surgery including the date and location.  How do I manage my blood sugar before surgery? . Check your blood sugar at least 4 times a day, starting 2 days before surgery, to make sure that the level is not too high or low. o Check your blood sugar the morning of your surgery when you wake up and every 2 hours until you get to the Short Stay unit. . If your blood sugar is less than 70 mg/dL, you will need to treat for low blood sugar: o Do not take insulin. o Treat a low blood sugar (less than 70 mg/dL) with  cup of clear juice (cranberry or apple), 4 glucose tablets, OR glucose gel. o Recheck blood sugar in 15 minutes after treatment (to make sure it is greater than 70 mg/dL). If your blood sugar is not greater than 70 mg/dL on recheck, call [phone] for further instructions. . Report your blood sugar to the short stay nurse when you get to Short Stay.  . If you are admitted  to the hospital after surgery: o Your blood sugar will be checked by the staff and you will probably be given insulin after surgery (instead of oral diabetes medicines) to make sure you have good blood sugar levels. o The goal for blood sugar control after surgery is 80-180 mg/dL.        Do not wear jewelry, make-up or nail polish.  Do not wear lotions, powders, or perfumes, or deodorant.  Do not shave 48 hours prior to surgery.  Men may shave face and neck.  Do not bring valuables to the hospital.  [facility] is not responsible for any belongings or valuables.   [facility]- Preparing For  Surgery  Before surgery, you can play an important role. Because skin is not sterile, your skin needs to be as free of germs as possible. You can reduce the number of germs on your skin by washing with CHG (chlorahexidine gluconate) Soap before surgery.  CHG is an antiseptic cleaner which kills germs and bonds with the skin to continue killing germs even after washing.    Oral Hygiene is also important to reduce your risk of infection.  Remember - BRUSH YOUR TEETH THE MORNING OF SURGERY WITH YOUR REGULAR TOOTHPASTE  Please do not use if you have an allergy to CHG or antibacterial soaps. If your skin becomes reddened/irritated stop using the CHG.  Do not shave (including legs and underarms) for at least 48 hours prior to first CHG shower. It is OK to shave your face.  Please follow these instructions carefully.   1. Shower the NIGHT BEFORE SURGERY and the MORNING OF SURGERY with CHG.   2. If you chose to wash your hair, wash your hair first as usual with your normal shampoo.  3. After you shampoo, rinse your hair and body thoroughly to remove the shampoo.  4. Use CHG as you would any other liquid soap. You can apply CHG directly to the skin and wash gently with a scrungie or a clean washcloth.   5. Apply the CHG Soap to your body ONLY FROM THE NECK DOWN.  Do not use on open wounds or open sores. Avoid contact with your eyes, ears, mouth and genitals (private parts). Wash Face and genitals (private parts)  with your normal soap.  6. Wash thoroughly, paying special attention to the area where your surgery will be performed.  7. Thoroughly rinse your body with warm water from the neck down.  8. DO NOT shower/wash with your normal soap after using and rinsing off the CHG Soap.  9. Pat yourself dry with a CLEAN TOWEL.  10. Wear CLEAN PAJAMAS to bed the night before surgery, wear comfortable clothes the morning of surgery  11. Place CLEAN SHEETS on your bed the night of your first shower and  DO NOT SLEEP WITH PETS.    Day of Surgery:  Do not apply any deodorants/lotions.  Please wear clean clothes to the hospital/surgery center.   Remember to brush your teeth WITH YOUR REGULAR TOOTHPASTE.   Contacts, dentures or bridgework may not be worn into surgery.  Leave your suitcase in the car.  After surgery it may be brought to your room.  For patients admitted to the hospital, discharge time will be determined by your treatment team.  Patients discharged the day of surgery will not be allowed to drive home.   Please read over the following fact sheets that you were given. Coughing and Deep Breathing and Surgical Site Infection Prevention

## 2018-01-21 ENCOUNTER — Other Ambulatory Visit: Payer: Self-pay

## 2018-01-21 ENCOUNTER — Encounter (HOSPITAL_COMMUNITY)
Admission: RE | Admit: 2018-01-21 | Discharge: 2018-01-21 | Disposition: A | Discharge status: Home / Self Care | Payer: Medicare Other | Source: Ambulatory Visit | Attending: Physician Assistant | Admitting: Physician Assistant

## 2018-01-21 ENCOUNTER — Encounter (HOSPITAL_COMMUNITY)
Admission: RE | Admit: 2018-01-21 | Discharge: 2018-01-21 | Disposition: A | Discharge status: Home / Self Care | Payer: Medicare Other | Source: Ambulatory Visit | Attending: Orthopaedic Surgery | Admitting: Orthopaedic Surgery

## 2018-01-21 ENCOUNTER — Encounter (HOSPITAL_COMMUNITY): Payer: Self-pay

## 2018-01-21 DIAGNOSIS — M1712 Unilateral primary osteoarthritis, left knee: Secondary | ICD-10-CM | POA: Diagnosis not present

## 2018-01-21 DIAGNOSIS — Z79899 Other long term (current) drug therapy: Secondary | ICD-10-CM | POA: Diagnosis not present

## 2018-01-21 DIAGNOSIS — I10 Essential (primary) hypertension: Secondary | ICD-10-CM | POA: Insufficient documentation

## 2018-01-21 DIAGNOSIS — Z7982 Long term (current) use of aspirin: Secondary | ICD-10-CM | POA: Diagnosis not present

## 2018-01-21 DIAGNOSIS — E785 Hyperlipidemia, unspecified: Secondary | ICD-10-CM | POA: Diagnosis not present

## 2018-01-21 DIAGNOSIS — Z01818 Encounter for other preprocedural examination: Secondary | ICD-10-CM | POA: Insufficient documentation

## 2018-01-21 DIAGNOSIS — Z8249 Family history of ischemic heart disease and other diseases of the circulatory system: Secondary | ICD-10-CM | POA: Diagnosis not present

## 2018-01-21 DIAGNOSIS — E119 Type 2 diabetes mellitus without complications: Secondary | ICD-10-CM | POA: Insufficient documentation

## 2018-01-21 DIAGNOSIS — Z7984 Long term (current) use of oral hypoglycemic drugs: Secondary | ICD-10-CM | POA: Insufficient documentation

## 2018-01-21 LAB — SURGICAL PCR SCREEN
MRSA, PCR: NEGATIVE
STAPHYLOCOCCUS AUREUS: POSITIVE — AB

## 2018-01-21 LAB — PROTIME-INR
INR: 1.02
PROTHROMBIN TIME: 13.3 s (ref 11.4–15.2)

## 2018-01-21 LAB — COMPREHENSIVE METABOLIC PANEL
ALBUMIN: 3.7 g/dL (ref 3.5–5.0)
ALT: 38 U/L (ref 0–44)
ANION GAP: 8 (ref 5–15)
AST: 42 U/L — ABNORMAL HIGH (ref 15–41)
Alkaline Phosphatase: 71 U/L (ref 38–126)
BILIRUBIN TOTAL: 0.8 mg/dL (ref 0.3–1.2)
BUN: 14 mg/dL (ref 8–23)
CO2: 26 mmol/L (ref 22–32)
Calcium: 9.5 mg/dL (ref 8.9–10.3)
Chloride: 103 mmol/L (ref 98–111)
Creatinine, Ser: 0.96 mg/dL (ref 0.61–1.24)
GFR calc Af Amer: >60 mL/min (ref >60)
GLUCOSE: 157 mg/dL — AB (ref 70–99)
POTASSIUM: 4 mmol/L (ref 3.5–5.1)
Sodium: 137 mmol/L (ref 135–145)
TOTAL PROTEIN: 7.2 g/dL (ref 6.5–8.1)

## 2018-01-21 LAB — ABO/RH: ABO/RH(D): A NEG

## 2018-01-21 LAB — CBC WITH DIFFERENTIAL/PLATELET
Abs Immature Granulocytes: 0.04 10*3/uL (ref 0.00–0.07)
BASOS ABS: 0.1 10*3/uL (ref 0.0–0.1)
BASOS PCT: 1 %
EOS ABS: 0.3 10*3/uL (ref 0.0–0.5)
EOS PCT: 4 %
HEMATOCRIT: 46.6 % (ref 39.0–52.0)
Hemoglobin: 14.6 g/dL (ref 13.0–17.0)
Immature Granulocytes: 1 %
Lymphocytes Relative: 25 %
Lymphs Abs: 1.6 10*3/uL (ref 0.7–4.0)
MCH: 28.4 pg (ref 26.0–34.0)
MCHC: 31.3 g/dL (ref 30.0–36.0)
MCV: 90.7 fL (ref 80.0–100.0)
Monocytes Absolute: 0.6 10*3/uL (ref 0.1–1.0)
Monocytes Relative: 9 %
NRBC: 0 % (ref 0.0–0.2)
Neutro Abs: 4 10*3/uL (ref 1.7–7.7)
Neutrophils Relative %: 60 %
Platelets: 182 10*3/uL (ref 150–400)
RBC: 5.14 MIL/uL (ref 4.22–5.81)
RDW: 14.6 % (ref 11.5–15.5)
WBC: 6.5 10*3/uL (ref 4.0–10.5)

## 2018-01-21 LAB — GLUCOSE, CAPILLARY: Glucose-Capillary: 156 mg/dL — ABNORMAL HIGH (ref 70–99)

## 2018-01-21 LAB — TYPE AND SCREEN
ABO/RH(D): A NEG
ANTIBODY SCREEN: NEGATIVE

## 2018-01-21 LAB — APTT: aPTT: 32 seconds (ref 24–36)

## 2018-01-21 NOTE — Progress Notes (Signed)
PCP: Merlene Laughter  Cardiologist: Verdis Prime  DM: Type 2 Does not check sugars at home  Sleep Apnea: yes, uses CPAP   Cath: 2008  Pt denies SOB, cough, fever, chest pain  Pt states understanding of instructions for day of surgery

## 2018-01-25 NOTE — Progress Notes (Signed)
Will you make sure mupirocin nasal swab has been called in?

## 2018-01-25 NOTE — Progress Notes (Signed)
Anesthesia Chart Review:   Case:  [id number] Date/Time:  01/29/18 1145   Procedure:  LEFT TOTAL KNEE ARTHROPLASTY (Left Knee)   Anesthesia type:  Spinal   Pre-op diagnosis:  left knee degenerative joint disease   Location:  [facility] ROOM 08 / [facility]   Surgeon:  Tarry Kos, MD      DISCUSSION: - Pt is a 62 year old male with hx CAD (stent to RCA many years ago), HTN, DM.   - Pt has cardiac clearance in comment on stress test 12/22/17   VS: BP 127/65   Pulse 64   Temp 36.9 C   Resp 20   Ht 5\' 9"  (1.753 m)   Wt 132.9 kg   SpO2 95%   BMI 43.25 kg/m    PROVIDERS: - PCP is Merlene Laughter, MD - Cardiologist is Verdis Prime, MD   LABS: Labs reviewed: Acceptable for surgery. (all labs ordered are listed, but only abnormal results are displayed)  Labs Reviewed  SURGICAL PCR SCREEN - Abnormal; Notable for the following components:      Result Value   Staphylococcus aureus POSITIVE (*)    All other components within normal limits  GLUCOSE, CAPILLARY - Abnormal; Notable for the following components:   Glucose-Capillary 156 (*)    All other components within normal limits  COMPREHENSIVE METABOLIC PANEL - Abnormal; Notable for the following components:   Glucose, Bld 157 (*)    [name] 42 (*)    All other components within normal limits  APTT  CBC WITH DIFFERENTIAL/PLATELET  PROTIME-INR  TYPE AND SCREEN  ABO/RH     IMAGES:  CXR 01/22/18: No active cardiopulmonary disease.   EKG 11/16/17: NSR   CV:  Nuclear stress test 12/22/17:   Nuclear stress EF: 62%.  There was no ST segment deviation noted during stress.  There is a medium defect of moderate severity present in the basal inferior, mid inferior and apical inferior location. The defect is non-reversible and in the setting of normal LVF this is consistent with diaphragmatic attenuation artifact. No ischemia noted.  The left ventricular ejection fraction is normal (55-65%).  This is a low risk study.  Cardiac cath  03/04/07:  1. Severe coronary artery disease with total occlusion of the RCA and left-to-right collaterals.  There is significant stenosis and small continuation of the CX beyond the OM1.  OM1, LAD, and left main are patent.  2. Normal left ventricular function. -  PLAN:  Aggressively titrate medications and attempt to control the patient's symptoms with medical therapy.  If this fails, consideration of repeat attempted PCI or coronary artery bypass grafting should be entertained.   Past Medical History:  Diagnosis Date  . CAD in native artery    cath report from 02/2017 indicates patient had a history of RCA stent >10 years prior to that. At time of that cath he was found to have EF 70%, 20-30% ostial LAD, 30-40% prox LAD, 30-40% Cx, 80-90% Cx beyond OM1, totally occluded RCA with left-to-right collaterals.  . Diabetes mellitus type 2 in obese (HCC)   . Hyperlipidemia   . Hypertension   . Morbid obesity (HCC)     Past Surgical History:  Procedure Laterality Date  . MENISCUS REPAIR    . TONSILLECTOMY      MEDICATIONS: . acetaminophen (TYLENOL) 650 MG CR tablet  . aspirin 325 MG tablet  . atorvastatin (LIPITOR) 80 MG tablet  . celecoxib (CELEBREX) 200 MG capsule  . Cholecalciferol (VITAMIN D3)  2000 units TABS  . colchicine 0.6 MG tablet  . Glucosamine-Chondroitin (COSAMIN DS PO)  . isosorbide mononitrate (IMDUR) 120 MG 24 hr tablet  . l-methylfolate-B6-B12 (METANX) 3-35-2 MG TABS tablet  . lisinopril (PRINIVIL,ZESTRIL) 5 MG tablet  . Menthol, Topical Analgesic, (BIOFREEZE) 4 % GEL  . metFORMIN (GLUCOPHAGE-XR) 500 MG 24 hr tablet  . metoprolol (LOPRESSOR) 50 MG tablet  . Multiple Vitamin (MULTIVITAMIN) tablet  . nitroGLYCERIN (NITROSTAT) 0.4 MG SL tablet  . NON FORMULARY  . NONFORMULARY OR COMPOUNDED ITEM  . NONFORMULARY OR COMPOUNDED ITEM  . omeprazole (PRILOSEC) 20 MG capsule  . pregabalin (LYRICA) 150 MG capsule  . Tetrahydrozoline HCl (VISINE OP)  .  triamterene-hydrochlorothiazide (MAXZIDE) 75-50 MG per tablet  . WELCHOL 625 MG tablet   No current facility-administered medications for this encounter.     If no changes, I anticipate pt can proceed with surgery as scheduled.   Rica Mast, FNP-BC [facility] Short Stay Surgical Center/Anesthesiology Phone: [phone] 01/25/2018 1:42 PM

## 2018-01-26 ENCOUNTER — Other Ambulatory Visit (INDEPENDENT_AMBULATORY_CARE_PROVIDER_SITE_OTHER): Payer: Self-pay

## 2018-01-28 MED ORDER — TRANEXAMIC ACID-NACL 1000-0.7 MG/100ML-% IV SOLN
1000.0000 mg | INTRAVENOUS | Status: AC
  Administered 2018-01-29: 1000 mg via INTRAVENOUS
  Filled 2018-01-28: qty 100

## 2018-01-28 MED ORDER — TRANEXAMIC ACID 1000 MG/10ML IV SOLN
2000.0000 mg | INTRAVENOUS | Status: AC
  Administered 2018-01-29: 2000 mg via TOPICAL
  Filled 2018-01-28: qty 20

## 2018-01-29 ENCOUNTER — Encounter (HOSPITAL_COMMUNITY)
Admission: RE | Disposition: A | Discharge status: Home Health | Payer: Self-pay | Source: Home / Self Care | Attending: Orthopaedic Surgery

## 2018-01-29 ENCOUNTER — Inpatient Hospital Stay (HOSPITAL_COMMUNITY): Payer: Medicare Other

## 2018-01-29 ENCOUNTER — Ambulatory Visit (HOSPITAL_COMMUNITY): Payer: Medicare Other | Admitting: Anesthesiology

## 2018-01-29 ENCOUNTER — Other Ambulatory Visit: Payer: Self-pay

## 2018-01-29 ENCOUNTER — Ambulatory Visit (HOSPITAL_COMMUNITY): Payer: Medicare Other | Admitting: Emergency Medicine

## 2018-01-29 ENCOUNTER — Inpatient Hospital Stay (HOSPITAL_COMMUNITY)
Admission: RE | Admit: 2018-01-29 | Discharge: 2018-01-31 | DRG: 470 | Disposition: A | Discharge status: Home Health | Payer: Medicare Other | Attending: Orthopaedic Surgery | Admitting: Orthopaedic Surgery

## 2018-01-29 ENCOUNTER — Encounter (HOSPITAL_COMMUNITY): Payer: Self-pay | Admitting: Surgery

## 2018-01-29 DIAGNOSIS — G4733 Obstructive sleep apnea (adult) (pediatric): Secondary | ICD-10-CM | POA: Diagnosis present

## 2018-01-29 DIAGNOSIS — Z955 Presence of coronary angioplasty implant and graft: Secondary | ICD-10-CM

## 2018-01-29 DIAGNOSIS — I10 Essential (primary) hypertension: Secondary | ICD-10-CM | POA: Diagnosis present

## 2018-01-29 DIAGNOSIS — Z7984 Long term (current) use of oral hypoglycemic drugs: Secondary | ICD-10-CM

## 2018-01-29 DIAGNOSIS — Z7982 Long term (current) use of aspirin: Secondary | ICD-10-CM | POA: Diagnosis not present

## 2018-01-29 DIAGNOSIS — I2510 Atherosclerotic heart disease of native coronary artery without angina pectoris: Secondary | ICD-10-CM | POA: Diagnosis present

## 2018-01-29 DIAGNOSIS — Z96659 Presence of unspecified artificial knee joint: Secondary | ICD-10-CM

## 2018-01-29 DIAGNOSIS — E785 Hyperlipidemia, unspecified: Secondary | ICD-10-CM | POA: Diagnosis present

## 2018-01-29 DIAGNOSIS — Z96652 Presence of left artificial knee joint: Secondary | ICD-10-CM

## 2018-01-29 DIAGNOSIS — M1712 Unilateral primary osteoarthritis, left knee: Secondary | ICD-10-CM | POA: Diagnosis present

## 2018-01-29 DIAGNOSIS — Z880 Allergy status to penicillin: Secondary | ICD-10-CM

## 2018-01-29 DIAGNOSIS — E119 Type 2 diabetes mellitus without complications: Secondary | ICD-10-CM | POA: Diagnosis present

## 2018-01-29 DIAGNOSIS — Z8249 Family history of ischemic heart disease and other diseases of the circulatory system: Secondary | ICD-10-CM | POA: Diagnosis not present

## 2018-01-29 HISTORY — DX: Dependence on other enabling machines and devices: Z99.89

## 2018-01-29 HISTORY — DX: Gastro-esophageal reflux disease without esophagitis: K21.9

## 2018-01-29 HISTORY — DX: Obstructive sleep apnea (adult) (pediatric): G47.33

## 2018-01-29 HISTORY — DX: Unspecified osteoarthritis, unspecified site: M19.90

## 2018-01-29 HISTORY — PX: TOTAL KNEE ARTHROPLASTY: SHX125

## 2018-01-29 LAB — GLUCOSE, CAPILLARY
GLUCOSE-CAPILLARY: 101 mg/dL — AB (ref 70–99)
Glucose-Capillary: 143 mg/dL — ABNORMAL HIGH (ref 70–99)
Glucose-Capillary: 156 mg/dL — ABNORMAL HIGH (ref 70–99)

## 2018-01-29 LAB — HEMOGLOBIN A1C
Hgb A1c MFr Bld: 6.8 % — ABNORMAL HIGH (ref 4.8–5.6)
MEAN PLASMA GLUCOSE: 148.46 mg/dL

## 2018-01-29 SURGERY — ARTHROPLASTY, KNEE, TOTAL
Anesthesia: Spinal | Site: Knee | Laterality: Left

## 2018-01-29 MED ORDER — BUPIVACAINE HCL (PF) 0.75 % IJ SOLN
INTRAMUSCULAR | Status: DC | PRN
  Administered 2018-01-29: 2 mL via INTRATHECAL

## 2018-01-29 MED ORDER — FENTANYL CITRATE (PF) 250 MCG/5ML IJ SOLN
INTRAMUSCULAR | Status: DC | PRN
  Administered 2018-01-29: 50 ug via INTRAVENOUS

## 2018-01-29 MED ORDER — SULFAMETHOXAZOLE-TRIMETHOPRIM 800-160 MG PO TABS: 1.0000 | ORAL_TABLET | Freq: Two times a day (BID) | ORAL | 0 refills | Status: DC

## 2018-01-29 MED ORDER — PHENYLEPHRINE 40 MCG/ML (10ML) SYRINGE FOR IV PUSH (FOR BLOOD PRESSURE SUPPORT)
PREFILLED_SYRINGE | INTRAVENOUS | Status: DC | PRN
  Administered 2018-01-29 (×4): 80 ug via INTRAVENOUS

## 2018-01-29 MED ORDER — METHOCARBAMOL 1000 MG/10ML IJ SOLN
500.0000 mg | Freq: Four times a day (QID) | INTRAVENOUS | Status: DC | PRN
  Filled 2018-01-29: qty 5

## 2018-01-29 MED ORDER — ISOSORBIDE MONONITRATE ER 60 MG PO TB24
120.0000 mg | ORAL_TABLET | Freq: Every day | ORAL | Status: DC
  Administered 2018-01-29 – 2018-01-31 (×3): 120 mg via ORAL
  Filled 2018-01-29 (×3): qty 2

## 2018-01-29 MED ORDER — TRIAMTERENE-HCTZ 75-50 MG PO TABS
0.5000 | ORAL_TABLET | Freq: Every day | ORAL | Status: DC
  Administered 2018-01-30: 0.5 via ORAL
  Filled 2018-01-29 (×2): qty 0.5

## 2018-01-29 MED ORDER — HYDROMORPHONE HCL 1 MG/ML IJ SOLN: 0.2500 mg | INTRAMUSCULAR | Status: DC | PRN

## 2018-01-29 MED ORDER — SODIUM CHLORIDE 0.9 % IR SOLN
Status: DC | PRN
  Administered 2018-01-29: 3000 mL

## 2018-01-29 MED ORDER — PROPOFOL 500 MG/50ML IV EMUL
INTRAVENOUS | Status: DC | PRN
  Administered 2018-01-29: 50 ug/kg/min via INTRAVENOUS

## 2018-01-29 MED ORDER — KETOROLAC TROMETHAMINE 15 MG/ML IJ SOLN
30.0000 mg | Freq: Four times a day (QID) | INTRAMUSCULAR | Status: AC
  Administered 2018-01-29 – 2018-01-30 (×3): 30 mg via INTRAVENOUS
  Administered 2018-01-30: 15 mg via INTRAVENOUS
  Filled 2018-01-29 (×4): qty 2

## 2018-01-29 MED ORDER — POLYETHYLENE GLYCOL 3350 17 G PO PACK: 17.0000 g | PACK | Freq: Every day | ORAL | Status: DC | PRN

## 2018-01-29 MED ORDER — SODIUM CHLORIDE 0.9 % IV SOLN
INTRAVENOUS | Status: DC
  Administered 2018-01-29: 18:00:00 via INTRAVENOUS

## 2018-01-29 MED ORDER — MAGNESIUM CITRATE PO SOLN: 1.0000 | Freq: Once | ORAL | Status: DC | PRN

## 2018-01-29 MED ORDER — ACETAMINOPHEN 325 MG PO TABS
325.0000 mg | ORAL_TABLET | Freq: Four times a day (QID) | ORAL | Status: DC | PRN
  Administered 2018-01-31: 650 mg via ORAL
  Filled 2018-01-29: qty 2

## 2018-01-29 MED ORDER — EPHEDRINE SULFATE-NACL 50-0.9 MG/10ML-% IV SOSY
PREFILLED_SYRINGE | INTRAVENOUS | Status: DC | PRN
  Administered 2018-01-29: 10 mg via INTRAVENOUS

## 2018-01-29 MED ORDER — CEFAZOLIN SODIUM-DEXTROSE 2-4 GM/100ML-% IV SOLN
INTRAVENOUS | Status: AC
  Filled 2018-01-29: qty 100

## 2018-01-29 MED ORDER — FENTANYL CITRATE (PF) 100 MCG/2ML IJ SOLN
INTRAMUSCULAR | Status: AC
  Administered 2018-01-29: 50 ug via INTRAVENOUS
  Filled 2018-01-29: qty 2

## 2018-01-29 MED ORDER — SENNOSIDES-DOCUSATE SODIUM 8.6-50 MG PO TABS: 1.0000 | ORAL_TABLET | Freq: Every evening | ORAL | 1 refills | Status: AC | PRN

## 2018-01-29 MED ORDER — BUPIVACAINE LIPOSOME 1.3 % IJ SUSP
20.0000 mL | INTRAMUSCULAR | Status: DC
  Filled 2018-01-29: qty 20

## 2018-01-29 MED ORDER — HYDROMORPHONE HCL 1 MG/ML IJ SOLN: 0.5000 mg | INTRAMUSCULAR | Status: DC | PRN

## 2018-01-29 MED ORDER — TRANEXAMIC ACID-NACL 1000-0.7 MG/100ML-% IV SOLN
1000.0000 mg | Freq: Once | INTRAVENOUS | Status: AC
  Administered 2018-01-29: 1000 mg via INTRAVENOUS
  Filled 2018-01-29: qty 100

## 2018-01-29 MED ORDER — METOCLOPRAMIDE HCL 5 MG/ML IJ SOLN: 5.0000 mg | Freq: Three times a day (TID) | INTRAMUSCULAR | Status: DC | PRN

## 2018-01-29 MED ORDER — ONDANSETRON HCL 4 MG PO TABS: 4.0000 mg | ORAL_TABLET | Freq: Four times a day (QID) | ORAL | Status: DC | PRN

## 2018-01-29 MED ORDER — FENTANYL CITRATE (PF) 100 MCG/2ML IJ SOLN
50.0000 ug | Freq: Once | INTRAMUSCULAR | Status: AC
  Administered 2018-01-29: 50 ug via INTRAVENOUS

## 2018-01-29 MED ORDER — 0.9 % SODIUM CHLORIDE (POUR BTL) OPTIME
TOPICAL | Status: DC | PRN
  Administered 2018-01-29: 1000 mL

## 2018-01-29 MED ORDER — ROPIVACAINE HCL 5 MG/ML IJ SOLN
INTRAMUSCULAR | Status: DC | PRN
  Administered 2018-01-29: 20 mL via PERINEURAL

## 2018-01-29 MED ORDER — OXYCODONE HCL 5 MG PO TABS: 10.0000 mg | ORAL_TABLET | ORAL | Status: DC | PRN

## 2018-01-29 MED ORDER — VANCOMYCIN HCL 1000 MG IV SOLR
INTRAVENOUS | Status: AC
  Filled 2018-01-29: qty 1000

## 2018-01-29 MED ORDER — EPHEDRINE 5 MG/ML INJ
INTRAVENOUS | Status: AC
  Filled 2018-01-29: qty 10

## 2018-01-29 MED ORDER — ONDANSETRON HCL 4 MG PO TABS: 4.0000 mg | ORAL_TABLET | Freq: Three times a day (TID) | ORAL | 0 refills | Status: DC | PRN

## 2018-01-29 MED ORDER — ASPIRIN EC 325 MG PO TBEC: 325.0000 mg | DELAYED_RELEASE_TABLET | Freq: Every day | ORAL | 0 refills | Status: AC

## 2018-01-29 MED ORDER — CHLORHEXIDINE GLUCONATE 4 % EX LIQD: 60.0000 mL | Freq: Once | CUTANEOUS | Status: DC

## 2018-01-29 MED ORDER — FENTANYL CITRATE (PF) 250 MCG/5ML IJ SOLN
INTRAMUSCULAR | Status: AC
  Filled 2018-01-29: qty 5

## 2018-01-29 MED ORDER — OXYCODONE HCL 5 MG PO TABS
5.0000 mg | ORAL_TABLET | ORAL | Status: DC | PRN
  Administered 2018-01-29: 10 mg via ORAL
  Filled 2018-01-29: qty 2

## 2018-01-29 MED ORDER — DEXTROSE 5 % IV SOLN
3.0000 g | INTRAVENOUS | Status: AC
  Administered 2018-01-29: 3 g via INTRAVENOUS
  Filled 2018-01-29: qty 3

## 2018-01-29 MED ORDER — DEXAMETHASONE SODIUM PHOSPHATE 10 MG/ML IJ SOLN
10.0000 mg | Freq: Once | INTRAMUSCULAR | Status: AC
  Administered 2018-01-30: 10 mg via INTRAVENOUS
  Filled 2018-01-29: qty 1

## 2018-01-29 MED ORDER — ATORVASTATIN CALCIUM 80 MG PO TABS
80.0000 mg | ORAL_TABLET | Freq: Every day | ORAL | Status: DC
  Administered 2018-01-29 – 2018-01-30 (×2): 80 mg via ORAL
  Filled 2018-01-29 (×2): qty 1

## 2018-01-29 MED ORDER — MIDAZOLAM HCL 2 MG/2ML IJ SOLN
INTRAMUSCULAR | Status: AC
  Administered 2018-01-29: 2 mg via INTRAVENOUS
  Filled 2018-01-29: qty 2

## 2018-01-29 MED ORDER — ONDANSETRON HCL 4 MG/2ML IJ SOLN
INTRAMUSCULAR | Status: DC | PRN
  Administered 2018-01-29: 4 mg via INTRAVENOUS

## 2018-01-29 MED ORDER — GABAPENTIN 300 MG PO CAPS: 300.0000 mg | ORAL_CAPSULE | Freq: Three times a day (TID) | ORAL | Status: DC

## 2018-01-29 MED ORDER — INSULIN ASPART 100 UNIT/ML ~~LOC~~ SOLN
0.0000 [IU] | Freq: Every day | SUBCUTANEOUS | Status: DC
  Administered 2018-01-30: 3 [IU] via SUBCUTANEOUS

## 2018-01-29 MED ORDER — SORBITOL 70 % SOLN: 30.0000 mL | Freq: Every day | Status: DC | PRN

## 2018-01-29 MED ORDER — METHOCARBAMOL 500 MG PO TABS: 500.0000 mg | ORAL_TABLET | Freq: Four times a day (QID) | ORAL | Status: DC | PRN

## 2018-01-29 MED ORDER — SODIUM CHLORIDE 0.9% FLUSH
INTRAVENOUS | Status: DC | PRN
  Administered 2018-01-29 (×2): 10 mL
  Administered 2018-01-29: 20 mL

## 2018-01-29 MED ORDER — PANTOPRAZOLE SODIUM 40 MG PO TBEC
40.0000 mg | DELAYED_RELEASE_TABLET | Freq: Every day | ORAL | Status: DC
  Administered 2018-01-30 – 2018-01-31 (×2): 40 mg via ORAL
  Filled 2018-01-29 (×2): qty 1

## 2018-01-29 MED ORDER — ONDANSETRON HCL 4 MG/2ML IJ SOLN
INTRAMUSCULAR | Status: AC
  Filled 2018-01-29: qty 2

## 2018-01-29 MED ORDER — ASPIRIN EC 325 MG PO TBEC
325.0000 mg | DELAYED_RELEASE_TABLET | Freq: Two times a day (BID) | ORAL | Status: DC
  Administered 2018-01-29 – 2018-01-31 (×4): 325 mg via ORAL
  Filled 2018-01-29 (×4): qty 1

## 2018-01-29 MED ORDER — DOCUSATE SODIUM 100 MG PO CAPS
100.0000 mg | ORAL_CAPSULE | Freq: Two times a day (BID) | ORAL | Status: DC
  Administered 2018-01-29 – 2018-01-31 (×4): 100 mg via ORAL
  Filled 2018-01-29 (×4): qty 1

## 2018-01-29 MED ORDER — OXYCODONE HCL ER 10 MG PO T12A: 10.0000 mg | EXTENDED_RELEASE_TABLET | Freq: Two times a day (BID) | ORAL | 0 refills | Status: AC

## 2018-01-29 MED ORDER — BUPIVACAINE LIPOSOME 1.3 % IJ SUSP
INTRAMUSCULAR | Status: DC | PRN
  Administered 2018-01-29: 20 mL

## 2018-01-29 MED ORDER — METHOCARBAMOL 750 MG PO TABS: 750.0000 mg | ORAL_TABLET | Freq: Two times a day (BID) | ORAL | 0 refills | Status: DC | PRN

## 2018-01-29 MED ORDER — ALUM & MAG HYDROXIDE-SIMETH 200-200-20 MG/5ML PO SUSP: 30.0000 mL | ORAL | Status: DC | PRN

## 2018-01-29 MED ORDER — SULFAMETHOXAZOLE-TRIMETHOPRIM 800-160 MG PO TABS
1.0000 | ORAL_TABLET | Freq: Two times a day (BID) | ORAL | Status: DC
  Administered 2018-01-29 – 2018-01-31 (×4): 1 via ORAL
  Filled 2018-01-29 (×4): qty 1

## 2018-01-29 MED ORDER — METOPROLOL TARTRATE 50 MG PO TABS
50.0000 mg | ORAL_TABLET | Freq: Two times a day (BID) | ORAL | Status: DC
  Administered 2018-01-30 – 2018-01-31 (×3): 50 mg via ORAL
  Filled 2018-01-29 (×4): qty 1

## 2018-01-29 MED ORDER — ACETAMINOPHEN 500 MG PO TABS
1000.0000 mg | ORAL_TABLET | Freq: Four times a day (QID) | ORAL | Status: AC
  Administered 2018-01-29 – 2018-01-30 (×4): 1000 mg via ORAL
  Filled 2018-01-29 (×4): qty 2

## 2018-01-29 MED ORDER — PROMETHAZINE HCL 25 MG/ML IJ SOLN: 6.2500 mg | INTRAMUSCULAR | Status: DC | PRN

## 2018-01-29 MED ORDER — PROMETHAZINE HCL 25 MG PO TABS: 25.0000 mg | ORAL_TABLET | Freq: Four times a day (QID) | ORAL | 1 refills | Status: DC | PRN

## 2018-01-29 MED ORDER — OXYCODONE HCL ER 10 MG PO T12A
10.0000 mg | EXTENDED_RELEASE_TABLET | Freq: Two times a day (BID) | ORAL | Status: DC
  Administered 2018-01-29 – 2018-01-31 (×4): 10 mg via ORAL
  Filled 2018-01-29 (×5): qty 1

## 2018-01-29 MED ORDER — ONDANSETRON HCL 4 MG/2ML IJ SOLN: 4.0000 mg | Freq: Four times a day (QID) | INTRAMUSCULAR | Status: DC | PRN

## 2018-01-29 MED ORDER — L-METHYLFOLATE-B6-B12 3-35-2 MG PO TABS
1.0000 | ORAL_TABLET | Freq: Every day | ORAL | Status: DC
  Administered 2018-01-30 – 2018-01-31 (×2): 1 via ORAL
  Filled 2018-01-29 (×3): qty 1

## 2018-01-29 MED ORDER — LISINOPRIL 5 MG PO TABS
5.0000 mg | ORAL_TABLET | Freq: Every day | ORAL | Status: DC
  Administered 2018-01-31: 5 mg via ORAL
  Filled 2018-01-29 (×2): qty 1

## 2018-01-29 MED ORDER — DIPHENHYDRAMINE HCL 12.5 MG/5ML PO ELIX: 25.0000 mg | ORAL_SOLUTION | ORAL | Status: DC | PRN

## 2018-01-29 MED ORDER — DEXTROSE 5 % IV SOLN
3.0000 g | Freq: Four times a day (QID) | INTRAVENOUS | Status: AC
  Administered 2018-01-29 – 2018-01-30 (×2): 3 g via INTRAVENOUS
  Filled 2018-01-29: qty 3
  Filled 2018-01-29: qty 3000
  Filled 2018-01-29: qty 3
  Filled 2018-01-29: qty 3000

## 2018-01-29 MED ORDER — MIDAZOLAM HCL 2 MG/2ML IJ SOLN
2.0000 mg | Freq: Once | INTRAMUSCULAR | Status: AC
  Administered 2018-01-29: 2 mg via INTRAVENOUS

## 2018-01-29 MED ORDER — METOCLOPRAMIDE HCL 5 MG PO TABS: 5.0000 mg | ORAL_TABLET | Freq: Three times a day (TID) | ORAL | Status: DC | PRN

## 2018-01-29 MED ORDER — COLESEVELAM HCL 625 MG PO TABS
1875.0000 mg | ORAL_TABLET | Freq: Every day | ORAL | Status: DC
  Administered 2018-01-29 – 2018-01-30 (×2): 1875 mg via ORAL
  Filled 2018-01-29 (×2): qty 3

## 2018-01-29 MED ORDER — PREGABALIN 75 MG PO CAPS
150.0000 mg | ORAL_CAPSULE | Freq: Two times a day (BID) | ORAL | Status: DC
  Administered 2018-01-29 – 2018-01-31 (×4): 150 mg via ORAL
  Filled 2018-01-29 (×4): qty 2

## 2018-01-29 MED ORDER — NITROGLYCERIN 0.4 MG SL SUBL: 0.4000 mg | SUBLINGUAL_TABLET | SUBLINGUAL | Status: DC | PRN

## 2018-01-29 MED ORDER — OXYCODONE HCL 5 MG PO TABS: 5.0000 mg | ORAL_TABLET | ORAL | 0 refills | Status: DC | PRN

## 2018-01-29 MED ORDER — PHENOL 1.4 % MT LIQD: 1.0000 | OROMUCOSAL | Status: DC | PRN

## 2018-01-29 MED ORDER — INSULIN ASPART 100 UNIT/ML ~~LOC~~ SOLN
0.0000 [IU] | Freq: Three times a day (TID) | SUBCUTANEOUS | Status: DC
  Administered 2018-01-30: 3 [IU] via SUBCUTANEOUS
  Administered 2018-01-30: 4 [IU] via SUBCUTANEOUS
  Administered 2018-01-30: 7 [IU] via SUBCUTANEOUS
  Administered 2018-01-31: 3 [IU] via SUBCUTANEOUS
  Administered 2018-01-31: 4 [IU] via SUBCUTANEOUS

## 2018-01-29 MED ORDER — VANCOMYCIN HCL 1000 MG IV SOLR
INTRAVENOUS | Status: DC | PRN
  Administered 2018-01-29 (×2): 1000 mg

## 2018-01-29 MED ORDER — CELECOXIB 200 MG PO CAPS
200.0000 mg | ORAL_CAPSULE | Freq: Two times a day (BID) | ORAL | Status: DC
  Administered 2018-01-29 – 2018-01-31 (×4): 200 mg via ORAL
  Filled 2018-01-29 (×4): qty 1

## 2018-01-29 MED ORDER — LACTATED RINGERS IV SOLN
INTRAVENOUS | Status: DC
  Administered 2018-01-29: 10:00:00 via INTRAVENOUS

## 2018-01-29 MED ORDER — OXYCODONE HCL 5 MG PO TABS: 5.0000 mg | ORAL_TABLET | Freq: Once | ORAL | Status: DC | PRN

## 2018-01-29 MED ORDER — OXYCODONE HCL 5 MG/5ML PO SOLN: 5.0000 mg | Freq: Once | ORAL | Status: DC | PRN

## 2018-01-29 MED ORDER — MENTHOL 3 MG MT LOZG: 1.0000 | LOZENGE | OROMUCOSAL | Status: DC | PRN

## 2018-01-29 SURGICAL SUPPLY — 74 items
ALCOHOL ISOPROPYL (RUBBING) (MISCELLANEOUS) ×3 IMPLANT
BAG DECANTER FOR FLEXI CONT (MISCELLANEOUS) ×3 IMPLANT
BANDAGE ESMARK 6X9 LF (GAUZE/BANDAGES/DRESSINGS) ×1 IMPLANT
BASEPLATE TIBIAL LT SZ 5 (Knees) ×1 IMPLANT
BLADE SAW SGTL 13.0X1.19X90.0M (BLADE) ×3 IMPLANT
BNDG ELASTIC 6X10 VLCR STRL LF (GAUZE/BANDAGES/DRESSINGS) ×3 IMPLANT
BNDG ESMARK 6X9 LF (GAUZE/BANDAGES/DRESSINGS) ×3
BOWL SMART MIX CTS (DISPOSABLE) ×3 IMPLANT
CEMENT BONE REFOBACIN R1X40 US (Cement) ×6 IMPLANT
CLOSURE STERI-STRIP 1/2X4 (GAUZE/BANDAGES/DRESSINGS) ×2
CLSR STERI-STRIP ANTIMIC 1/2X4 (GAUZE/BANDAGES/DRESSINGS) ×4 IMPLANT
COVER SURGICAL LIGHT HANDLE (MISCELLANEOUS) ×3 IMPLANT
COVER WAND RF STERILE (DRAPES) ×3 IMPLANT
CUFF TOURNIQUET SINGLE 34IN LL (TOURNIQUET CUFF) ×3 IMPLANT
DERMABOND ADHESIVE PROPEN (GAUZE/BANDAGES/DRESSINGS) ×2
DERMABOND ADVANCED .7 DNX6 (GAUZE/BANDAGES/DRESSINGS) ×1 IMPLANT
DRAPE EXTREMITY T 121X128X90 (DRAPE) ×3 IMPLANT
DRAPE HALF SHEET 40X57 (DRAPES) ×6 IMPLANT
DRAPE INCISE IOBAN 66X45 STRL (DRAPES) ×3 IMPLANT
DRAPE ORTHO SPLIT 77X108 STRL (DRAPES) ×4
DRAPE POUCH INSTRU U-SHP 10X18 (DRAPES) ×3 IMPLANT
DRAPE SURG ORHT 6 SPLT 77X108 (DRAPES) ×2 IMPLANT
DRAPE U-SHAPE 47X51 STRL (DRAPES) ×6 IMPLANT
DRSG AQUACEL AG ADV 3.5X10 (GAUZE/BANDAGES/DRESSINGS) ×3 IMPLANT
DRSG PAD ABDOMINAL 8X10 ST (GAUZE/BANDAGES/DRESSINGS) ×3 IMPLANT
DURAPREP 26ML APPLICATOR (WOUND CARE) ×6 IMPLANT
ELECT CAUTERY BLADE 6.4 (BLADE) ×3 IMPLANT
ELECT REM PT RETURN 9FT ADLT (ELECTROSURGICAL) ×3
ELECTRODE REM PT RTRN 9FT ADLT (ELECTROSURGICAL) ×1 IMPLANT
FEM COMP OXINIUM SZ 5 LT (Knees) ×3 IMPLANT
FEMORAL COMP OXINIUM SZ 5 LT (Knees) ×1 IMPLANT
GAUZE SPONGE 4X4 12PLY STRL LF (GAUZE/BANDAGES/DRESSINGS) ×3 IMPLANT
GAUZE XEROFORM 5X9 LF (GAUZE/BANDAGES/DRESSINGS) ×3 IMPLANT
GLOVE BIOGEL PI IND STRL 7.0 (GLOVE) ×1 IMPLANT
GLOVE BIOGEL PI INDICATOR 7.0 (GLOVE) ×2
GLOVE ECLIPSE 7.0 STRL STRAW (GLOVE) ×9 IMPLANT
GLOVE SKINSENSE NS SZ7.5 (GLOVE) ×2
GLOVE SKINSENSE STRL SZ7.5 (GLOVE) ×1 IMPLANT
GLOVE SURG SYN 7.5  E (GLOVE) ×8
GLOVE SURG SYN 7.5 E (GLOVE) ×4 IMPLANT
GOWN STRL REIN XL XLG (GOWN DISPOSABLE) ×3 IMPLANT
GOWN STRL REUS W/ TWL LRG LVL3 (GOWN DISPOSABLE) ×3 IMPLANT
GOWN STRL REUS W/TWL LRG LVL3 (GOWN DISPOSABLE) ×6
HANDPIECE INTERPULSE COAX TIP (DISPOSABLE) ×2
HOOD PEEL AWAY FLYTE STAYCOOL (MISCELLANEOUS) ×12 IMPLANT
INSERT XLPE SZ 5-6 CS 13 (Knees) ×3 IMPLANT
KIT BASIN OR (CUSTOM PROCEDURE TRAY) ×3 IMPLANT
KIT TURNOVER KIT B (KITS) ×3 IMPLANT
MANIFOLD NEPTUNE II (INSTRUMENTS) ×3 IMPLANT
MARKER SKIN DUAL TIP RULER LAB (MISCELLANEOUS) ×3 IMPLANT
NEEDLE SPNL 18GX3.5 QUINCKE PK (NEEDLE) ×6 IMPLANT
NS IRRIG 1000ML POUR BTL (IV SOLUTION) ×3 IMPLANT
PACK TOTAL JOINT (CUSTOM PROCEDURE TRAY) ×3 IMPLANT
PAD ABD 8X10 STRL (GAUZE/BANDAGES/DRESSINGS) ×6 IMPLANT
PAD ARMBOARD 7.5X6 YLW CONV (MISCELLANEOUS) ×6 IMPLANT
PATELLA 32MM (Knees) ×3 IMPLANT
PIN TROCAR 3 INCH (PIN) ×3 IMPLANT
SAW OSC TIP CART 19.5X105X1.3 (SAW) ×3 IMPLANT
SET HNDPC FAN SPRY TIP SCT (DISPOSABLE) ×1 IMPLANT
SUCTION FRAZIER HANDLE 10FR (MISCELLANEOUS) ×2
SUCTION TUBE FRAZIER 10FR DISP (MISCELLANEOUS) ×1 IMPLANT
SUT ETHILON 2 0 FS 18 (SUTURE) IMPLANT
SUT MNCRL AB 4-0 PS2 18 (SUTURE) IMPLANT
SUT VIC AB 0 CT1 27 (SUTURE) ×4
SUT VIC AB 0 CT1 27XBRD ANBCTR (SUTURE) ×2 IMPLANT
SUT VIC AB 1 CTX 27 (SUTURE) ×9 IMPLANT
SUT VIC AB 2-0 CT1 27 (SUTURE) ×8
SUT VIC AB 2-0 CT1 TAPERPNT 27 (SUTURE) ×4 IMPLANT
SYR 50ML LL SCALE MARK (SYRINGE) ×6 IMPLANT
TIBIAL BASEPLATE SZ 5 (Knees) ×3 IMPLANT
TOWEL OR 17X26 10 PK STRL BLUE (TOWEL DISPOSABLE) ×3 IMPLANT
TRAY CATH 16FR W/PLASTIC CATH (SET/KITS/TRAYS/PACK) ×3 IMPLANT
UNDERPAD 30X30 (UNDERPADS AND DIAPERS) ×3 IMPLANT
WRAP KNEE MAXI GEL POST OP (GAUZE/BANDAGES/DRESSINGS) ×3 IMPLANT

## 2018-01-29 NOTE — Progress Notes (Signed)
Orthopedic Tech Progress Note Patient Details:  [name] [birth year] [id number]  CPM Left Knee CPM Left Knee: On Left Knee Flexion (Degrees): 90 Left Knee Extension (Degrees): 0 Additional Comments: trapeze bar patient helper  Post Interventions Patient Tolerated: Well Instructions Provided: Care of device Viewed order from doctor's order list Nikki Dom 01/29/2018, 3:53 PM

## 2018-01-29 NOTE — Op Note (Signed)
Total Knee Arthroplasty Procedure Note  Preoperative diagnosis: Left knee osteoarthritis  Postoperative diagnosis:same  Operative procedure: Left total knee arthroplasty. CPT [phone]  Surgeon: N. Glee Arvin, MD  Assist: Oneal Grout, PA-C; necessary for the timely completion of procedure and due to complexity of procedure.  Anesthesia: Spinal, regional  Tourniquet time: see anesthesia record  Implants used: [employer] Femur: PS 5 Tibia: 5 Patella: 32 mm Polyethylene: 13 mm  Indication: [name] is a 62 y.o. year old male with a history of knee pain. Having failed conservative management, the patient elected to proceed with a total knee arthroplasty.  We have reviewed the risk and benefits of the surgery and they elected to proceed after voicing understanding.  Procedure:  After informed consent was obtained and understanding of the risk were voiced including but not limited to bleeding, infection, damage to surrounding structures including nerves and vessels, blood clots, leg length inequality and the failure to achieve desired results, the operative extremity was marked with verbal confirmation of the patient in the holding area.   The patient was then brought to the operating room and transported to the operating room table in the supine position.  A tourniquet was applied to the operative extremity around the upper thigh. The operative limb was then prepped and draped in the usual sterile fashion and preoperative antibiotics were administered.  A time out was performed prior to the start of surgery confirming the correct extremity, preoperative antibiotic administration, as well as team members, implants and instruments available for the case. Correct surgical site was also confirmed with preoperative radiographs. The limb was then elevated for exsanguination and the tourniquet was inflated. A midline incision was made and a standard medial parapatellar  approach was performed.  The patella was prepared and sized to a 32 mm.  A cover was placed on the patella for protection from retractors.  We then turned our attention to the femur. Posterior cruciate ligament was sacrificed. Start site was drilled in the femur and the intramedullary distal femoral cutting guide was placed, set at 5 degrees valgus, taking 9 mm of distal resection. The distal cut was made. Osteophytes were then removed. Next, the proximal tibial cutting guide was placed with appropriate slope, varus/valgus alignment and depth of resection. The proximal tibial cut was made. Gap blocks were then used to assess the extension gap and alignment, and appropriate soft tissue releases were performed. Attention was turned back to the femur, which was sized using the sizing guide to a size 5. Appropriate rotation of the femoral component was determined using epicondylar axis, Whiteside's line, and assessing the flexion gap under ligament tension. The appropriate size 4-in-1 cutting block was placed and cuts were made. Posterior femoral osteophytes and uncapped bone were then removed with the curved osteotome. The tibia was sized for a size 5 component. The femoral box-cutting guide was placed and prepared for a PS femoral component. Trial components were placed, and stability was checked in full extension, mid-flexion, and deep flexion. Proper tibial rotation was determined and marked.  The patella tracked well without a lateral release. Trial components were then removed and tibial preparation performed. A posterior capsular injection comprising of 20 cc of 1.3% exparel and 40 cc of normal saline was performed for postoperative pain control. The bony surfaces were irrigated with a pulse lavage and then dried. Bone cement was vacuum mixed on the back table, and the final components sized above were cemented into place. After  cement had finished curing, excess cement was removed. The stability of the  construct was re-evaluated throughout a range of motion and found to be acceptable. The trial liner was removed, the knee was copiously irrigated, and the knee was re-evaluated for any excess bone debris. The real polyethylene liner,  13 mm thick, was inserted and checked to ensure the locking mechanism had engaged appropriately. The tourniquet was deflated and hemostasis was achieved. The wound was irrigated with normal saline.  One gram of vancomycin powder was placed in the surgical bed. A drain was not placed. Capsular closure was performed with a #1 vicryl, subcutaneous fat closed with a 0 vicryl suture, then subcutaneous tissue closed with interrupted 2.0 vicryl suture. The skin was then closed with a 2.0 nylon and dermabond. A sterile dressing was applied.  The patient was awakened in the operating room and taken to recovery in stable condition. All sponge, needle, and instrument counts were correct at the end of the case.  Position: supine  Complications: none.  Time Out: performed   Drains/Packing: none  Estimated blood loss: minimal  Returned to Recovery Room: in good condition.   Antibiotics: yes   Mechanical VTE (DVT) Prophylaxis: sequential compression devices, TED thigh-high  Chemical VTE (DVT) Prophylaxis: aspirin 325 bid  Fluid Replacement  Crystalloid: see anesthesia record Blood: none  FFP: none   Specimens Removed: 1 to pathology   Sponge and Instrument Count Correct? yes   PACU: portable radiograph - knee AP and Lateral   Admission: inpatient status  Plan/RTC: Return in 2 weeks for wound check.   Weight Bearing/Load Lower Extremity: full   N. Glee Arvin, MD [facility] Orth[facility] [phone] 12:11 PM

## 2018-01-29 NOTE — Anesthesia Preprocedure Evaluation (Signed)
Anesthesia Evaluation  Patient identified by MRN, date of birth, ID band Patient awake    Reviewed: Allergy & Precautions, NPO status , Patient's Chart, lab work & pertinent test results, reviewed documented beta blocker date and time   Airway Mallampati: II  TM Distance: >3 FB Neck ROM: Full    Dental no notable dental hx.    Pulmonary sleep apnea ,    Pulmonary exam normal breath sounds clear to auscultation       Cardiovascular hypertension, Pt. on medications and Pt. on home beta blockers + CAD  Normal cardiovascular exam Rhythm:Regular Rate:Normal     Neuro/Psych negative neurological ROS  negative psych ROS   GI/Hepatic negative GI ROS, Neg liver ROS,   Endo/Other  diabetes, Type obesity  Renal/GU negative Renal ROS  negative genitourinary   Musculoskeletal  (+) Arthritis , Osteoarthritis,    Abdominal (+) + obese,   Peds negative pediatric ROS (+)  Hematology negative hematology ROS (+)   Anesthesia Other Findings   Reproductive/Obstetrics negative OB ROS                             Anesthesia Physical Anesthesia Plan  ASA: III  Anesthesia Plan: Spinal   Post-op Pain Management:  Regional for Post-op pain   Induction: Intravenous  PONV Risk Score and Plan: 1 and Ondansetron  Airway Management Planned: Simple Face Mask  Additional Equipment:   Intra-op Plan:   Post-operative Plan:   Informed Consent: I have reviewed the patients History and Physical, chart, labs and discussed the procedure including the risks, benefits and alternatives for the proposed anesthesia with the patient or authorized representative who has indicated his/her understanding and acceptance.   Dental advisory given  Plan Discussed with: CRNA  Anesthesia Plan Comments:         Anesthesia Quick Evaluation

## 2018-01-29 NOTE — Anesthesia Procedure Notes (Signed)
Spinal  Patient location during procedure: OR Start time: 01/29/2018 12:20 PM End time: 01/29/2018 12:25 PM Staffing Anesthesiologist: Lowella Curb, MD Performed: anesthesiologist  Preanesthetic Checklist Completed: patient identified, site marked, surgical consent, pre-op evaluation, timeout performed, IV checked, risks and benefits discussed and monitors and equipment checked Spinal Block Patient position: sitting Prep: Betadine and site prepped and draped Patient monitoring: heart rate, continuous pulse ox and blood pressure Approach: midline Location: L3-4 Injection technique: single-shot Needle Needle type: Quincke  Needle gauge: 22 G Needle length: 9 cm

## 2018-01-29 NOTE — Progress Notes (Signed)
Received call from [facility]. Preoperatively arranged with [facility] for [facility]. Will need HH orders at time of dc.Isidoro Donning RN CCM Case Mgmt phone [phone]

## 2018-01-29 NOTE — Progress Notes (Signed)
1730 Received pt from PACU, A&O x4. LLE with ace wrap dry and intact. CPM at 0-90 degrees, off at 1815. Bone foam in use.

## 2018-01-29 NOTE — Anesthesia Procedure Notes (Addendum)
Anesthesia Regional Block: Adductor canal block   Pre-Anesthetic Checklist: ,, timeout performed, Correct Patient, Correct Site, Correct Laterality, Correct Procedure, Correct Position, site marked, Risks and benefits discussed,  Surgical consent,  Pre-op evaluation,  At surgeon's request and post-op pain management  Laterality: Left  Prep: chloraprep       Needles:  Injection technique: Single-shot  Needle Type: Stimiplex     Needle Length: 9cm  Needle Gauge: 21     Additional Needles:   Procedures:,,,, ultrasound used (permanent image in chart),,,,  Narrative:  Start time: 01/29/2018 10:03 AM End time: 01/29/2018 10:18 AM Injection made incrementally with aspirations every 5 mL.  Performed by: Personally  Anesthesiologist: Lowella Curb, MD

## 2018-01-29 NOTE — H&P (Signed)
PREOPERATIVE H&P  Chief Complaint: left knee degenerative joint disease  HPI: [name] is a 62 y.o. male who presents for surgical treatment of left knee degenerative joint disease.  He denies any changes in medical history.  Past Medical History:  Diagnosis Date  . CAD in native artery    cath report from 02/2017 indicates patient had a history of RCA stent >10 years prior to that. At time of that cath he was found to have EF 70%, 20-30% ostial LAD, 30-40% prox LAD, 30-40% Cx, 80-90% Cx beyond OM1, totally occluded RCA with left-to-right collaterals.  . Diabetes mellitus type 2 in obese (HCC)   . Hyperlipidemia   . Hypertension   . Morbid obesity (HCC)    Past Surgical History:  Procedure Laterality Date  . MENISCUS REPAIR    . TONSILLECTOMY     Social History   Socioeconomic History  . Marital status: Married    Spouse name: Not on file  . Number of children: Not on file  . Years of education: Not on file  . Highest education level: Not on file  Occupational History  . Not on file  Social Needs  . Financial resource strain: Not on file  . Food insecurity:    Worry: Not on file    Inability: Not on file  . Transportation needs:    Medical: Not on file    Non-medical: Not on file  Tobacco Use  . Smoking status: Never Smoker  . Smokeless tobacco: Never Used  Substance and Sexual Activity  . Alcohol use: No    Comment: ocassional  . Drug use: No  . Sexual activity: Not on file  Lifestyle  . Physical activity:    Days per week: Not on file    Minutes per session: Not on file  . Stress: Not on file  Relationships  . Social connections:    Talks on phone: Not on file    Gets together: Not on file    Attends religious service: Not on file    Active member of club or organization: Not on file    Attends meetings of clubs or organizations: Not on file    Relationship status: Not on file  Other Topics Concern  . Not on file  Social History Narrative    . Not on file   Family History  Problem Relation Age of Onset  . Heart attack Mother   . Heart attack Father    Allergies  Allergen Reactions  . Penicillins Rash and Other (See Comments)    Has patient had a PCN reaction causing immediate rash, facial/tongue/throat swelling, SOB or lightheadedness with hypotension: Yes Has patient had a PCN reaction causing severe rash involving mucus membranes or skin necrosis: Unknown Has patient had a PCN reaction that required hospitalization: Unknown Has patient had a PCN reaction occurring within the last 10 years: No If all of the above answers are "NO", then may proceed with Cephalosporin use.    Prior to Admission medications   Medication Sig Start Date End Date Taking? Authorizing Provider  acetaminophen (TYLENOL) 650 MG CR tablet Take 1,300 mg by mouth at bedtime.   Yes [provider]  aspirin 325 MG tablet Take 325 mg by mouth daily.   Yes [provider]  atorvastatin (LIPITOR) 80 MG tablet Take 80 mg by mouth daily.  02/09/13  Yes [provider]  celecoxib (CELEBREX) 200 MG capsule TAKE 1 CAPSULE BY MOUTH EVERY DAY  Patient taking differently: Take 200 mg by mouth daily.  08/11/17  Yes Vivi Barrack, DPM  Cholecalciferol (VITAMIN D3) 2000 units TABS Take 2,000 Units by mouth daily.   Yes [provider]  Glucosamine-Chondroitin (COSAMIN DS PO) Take 2 tablets by mouth daily.    Yes [provider]  isosorbide mononitrate (IMDUR) 120 MG 24 hr tablet TAKE 1 TABLET(120 MG) BY MOUTH DAILY Patient taking differently: Take 120 mg by mouth daily.  06/11/17  Yes Lyn Records, MD  l-methylfolate-B6-B12 [facility]) 3-35-2 MG TABS tablet One tablet by mouth two times daily. Patient taking differently: Take 1 tablet by mouth daily.  04/21/17  Yes Vivi Barrack, DPM  lisinopril (PRINIVIL,ZESTRIL) 5 MG tablet Take 5 mg by mouth daily.  07/03/13  Yes [provider]  Menthol, Topical Analgesic,  (BIOFREEZE) 4 % GEL Apply 1 application topically at bedtime.   Yes [provider]  metFORMIN (GLUCOPHAGE-XR) 500 MG 24 hr tablet Take 500 mg by mouth daily with breakfast.  01/19/17  Yes [provider]  metoprolol (LOPRESSOR) 50 MG tablet TAKE ONE TABLET BY MOUTH TWICE DAILY Patient taking differently: Take 50 mg by mouth 2 (two) times daily.  03/11/16  Yes Lyn Records, MD  Multiple Vitamin (MULTIVITAMIN) tablet Take 1 tablet by mouth daily.   Yes [provider]  nitroGLYCERIN (NITROSTAT) 0.4 MG SL tablet Place 1 tablet (0.4 mg total) under the tongue every 5 (five) minutes x 3 doses as needed for chest pain. 11/28/16  Yes Lyn Records, MD  omeprazole (PRILOSEC) 20 MG capsule Take 20 mg by mouth daily.  02/03/13  Yes [provider]  pregabalin (LYRICA) 150 MG capsule Take 1 capsule (150 mg total) by mouth 2 (two) times daily. 03/16/17  Yes Vivi Barrack, DPM  Tetrahydrozoline HCl (VISINE OP) Place 1 drop into both eyes daily as needed (for dry eyes).   Yes [provider]  triamterene-hydrochlorothiazide (MAXZIDE) 75-50 MG per tablet Take 0.5 tablets by mouth daily.  01/06/13  Yes [provider]  WELCHOL 625 MG tablet Take 1,875 mg by mouth at bedtime.  05/19/13  Yes [provider]  colchicine 0.6 MG tablet Take 1 tablet (0.6 mg total) by mouth daily. Patient not taking: Reported on 01/15/2018 04/13/17   Vivi Barrack, DPM  NON [facility]  Peripheral Neuropathy Cream- Bupivacaine 1%, Doxepin 3%, Gabapentin 6%, Pentoxifylline 3%, Topiramate 1% Apply 1-2 grams to affected area 3-4 times daily Qty. 120 gm 3 refills    [provider]  NONFORMULARY OR COMPOUNDED ITEM [facility] compound:  Peripheral Neuropathy Cream - Bupivacaine 1%, Doxepin 3%, Gabapentin 6%, Pentoxifylline 3%, Topiramate 1% dispense 120 gram, apply 1-2 grams to affected area 3-4 times daily, +2refills Patient not taking:  Reported on 01/15/2018 05/14/15   Vivi Barrack, DPM  NONFORMULARY OR COMPOUNDED ITEM [facility]:  Authorized Substitute Pain Cream - Ibuprofen 15%, Baclofen 1%, Gabapentin 3%, Lidocaine 2%, apply 1-2 grams to affected area 3-4 times daily, +3Refills. Patient not taking: Reported on 01/15/2018 11/10/16   Vivi Barrack, DPM     Positive ROS: All other systems have been reviewed and were otherwise negative with the exception of those mentioned in the HPI and as above.  Physical Exam: General: Alert, no acute distress Cardiovascular: No pedal edema Respiratory: No cyanosis, no use of accessory musculature GI: abdomen soft Skin: No lesions in the area of chief complaint Neurologic: Sensation intact distally Psychiatric: Patient is competent  for consent with normal mood and affect Lymphatic: no lymphedema  MUSCULOSKELETAL: exam stable  Assessment: left knee degenerative joint disease  Plan: Plan for Procedure(s): LEFT TOTAL KNEE ARTHROPLASTY  The risks benefits and alternatives were discussed with the patient including but not limited to the risks of nonoperative treatment, versus surgical intervention including infection, bleeding, nerve injury,  blood clots, cardiopulmonary complications, morbidity, mortality, among others, and they were willing to proceed.   Preoperative templating of the joint replacement has been completed, documented, and submitted to the Operating Room personnel in order to optimize intra-operative equipment management.  Anticipated LOS equal to or greater than 2 midnights due to - Age 75 and older with one or more of the following:  - Obesity  - Expected need for hospital services (PT, OT, Nursing) required for safe  discharge  - Anticipated need for postoperative skilled nursing care or inpatient rehab  - Active co-morbidities: Coronary Artery Disease  Glee Arvin, MD   01/29/2018 7:49 AM

## 2018-01-29 NOTE — Discharge Instructions (Signed)

## 2018-01-30 LAB — BASIC METABOLIC PANEL
ANION GAP: 10 (ref 5–15)
BUN: 19 mg/dL (ref 8–23)
CO2: 24 mmol/L (ref 22–32)
Calcium: 8.3 mg/dL — ABNORMAL LOW (ref 8.9–10.3)
Chloride: 103 mmol/L (ref 98–111)
Creatinine, Ser: 1.13 mg/dL (ref 0.61–1.24)
GFR calc non Af Amer: >60 mL/min (ref >60)
GLUCOSE: 165 mg/dL — AB (ref 70–99)
Potassium: 4.6 mmol/L (ref 3.5–5.1)
SODIUM: 137 mmol/L (ref 135–145)

## 2018-01-30 LAB — CBC
HEMATOCRIT: 39.5 % (ref 39.0–52.0)
Hemoglobin: 12.5 g/dL — ABNORMAL LOW (ref 13.0–17.0)
MCH: 28.7 pg (ref 26.0–34.0)
MCHC: 31.6 g/dL (ref 30.0–36.0)
MCV: 90.8 fL (ref 80.0–100.0)
NRBC: 0 % (ref 0.0–0.2)
PLATELETS: 145 10*3/uL — AB (ref 150–400)
RBC: 4.35 MIL/uL (ref 4.22–5.81)
RDW: 14.6 % (ref 11.5–15.5)
WBC: 6.8 10*3/uL (ref 4.0–10.5)

## 2018-01-30 LAB — GLUCOSE, CAPILLARY
GLUCOSE-CAPILLARY: 149 mg/dL — AB (ref 70–99)
GLUCOSE-CAPILLARY: 252 mg/dL — AB (ref 70–99)
Glucose-Capillary: 161 mg/dL — ABNORMAL HIGH (ref 70–99)
Glucose-Capillary: 206 mg/dL — ABNORMAL HIGH (ref 70–99)

## 2018-01-30 NOTE — Care Management Note (Signed)
Case Management Note  Patient Details  Name: [name] MRN: [id number] Date of Birth: [birth year]  Subjective/Objective:        Pt from home with wife for TKA.  Pt set up preoperatively with [facility] agency, as this is the agency that his wife receives services through.        Pt states he has RW and 3n1 at home and does not need.  Action/Plan: Marcelino Duster with [facility] contacted and notified of patient's dc today.  No further needs at this time.   Expected Discharge Date:       01/30/18           Expected Discharge Plan:  Home w Home Health Services  In-House Referral:     Discharge planning Services  CM Consult  Post Acute Care Choice:  Home Health Choice offered to:  Patient  DME Arranged:  N/A DME Agency:  NA  HH Arranged:  PT HH Agency:  [facility]  Status of Service:  Completed, signed off  If discussed at Long Length of Stay Meetings, dates discussed:    Additional Comments:  Deveron Furlong, RN 01/30/2018, 10:22 AM

## 2018-01-30 NOTE — Plan of Care (Signed)
  Problem: Pain Managment: ?Goal: General experience of comfort will improve ?Outcome: Progressing ?  ?Problem: Nutrition: ?Goal: Adequate nutrition will be maintained ?Outcome: Progressing ?  ?Problem: Safety: ?Goal: Ability to remain free from injury will improve ?Outcome: Progressing ?  ?Problem: Skin Integrity: ?Goal: Risk for impaired skin integrity will decrease ?Outcome: Progressing ?  ?

## 2018-01-30 NOTE — Evaluation (Signed)
Physical Therapy Evaluation Patient Details Name: [name] MRN: [id number] DOB: 1955-07-04 Today's Date: 01/30/2018   History of Present Illness  Pt is a 62 y.o. male s/p L TKA.   Clinical Impression  Pt is s/p TKA resulting in the deficits listed below (see PT Problem List). On eval, pt demonstrated modified independence with bed mobility. He required min guard assist transfers and ambulation 225 feet with RW. He lives in a [place] house with a level entry. Pt will benefit from skilled PT to increase their independence and safety with mobility to allow discharge to the venue listed below.      Follow Up Recommendations Follow surgeon's recommendation for DC plan and follow-up therapies;Supervision for mobility/OOB    Equipment Recommendations  None recommended by PT    Recommendations for Other Services       Precautions / Restrictions Precautions Precautions: Knee Precaution Comments: Pt educated on no pillow under knee. Restrictions Weight Bearing Restrictions: Yes LLE Weight Bearing: Weight bearing as tolerated      Mobility  Bed Mobility Overal bed mobility: Needs Assistance Bed Mobility: Supine to Sit     Supine to sit: Modified independent (Device/Increase time)     General bed mobility comments: +rail  Transfers Overall transfer level: Needs assistance Equipment used: Rolling walker (2 wheeled) Transfers: Sit to/from Stand Sit to Stand: Min guard         General transfer comment: cues for hand placement and sequencing  Ambulation/Gait Ambulation/Gait assistance: Min guard Gait Distance (Feet): 225 Feet Assistive device: Rolling walker (2 wheeled) Gait Pattern/deviations: Step-to pattern;Decreased stride length Gait velocity: decreased Gait velocity interpretation: 1.31 - 2.62 ft/sec, indicative of limited community ambulator General Gait Details: cues for RW management and posture  Stairs            Wheelchair Mobility    Modified  Rankin (Stroke Patients Only)       Balance Overall balance assessment: Needs assistance Sitting-balance support: No upper extremity supported;Feet supported Sitting balance-Leahy Scale: Good     Standing balance support: Bilateral upper extremity supported;During functional activity Standing balance-Leahy Scale: Poor Standing balance comment: reliant on BUE                             Pertinent Vitals/Pain Pain Assessment: 0-10 Pain Score: 4  Pain Location: L knee Pain Descriptors / Indicators: Operative site guarding;Sore Pain Intervention(s): Monitored during session;Repositioned;Ice applied;Premedicated before session    Home Living Family/patient expects to be discharged to:: Private residence Living Arrangements: Spouse/significant other Available Help at Discharge: Family;Available 24 hours/day Type of Home: House Home Access: Level entry     Home Layout: One level Home Equipment: Walker - 4 wheels;Walker - 2 wheels;Cane - single point;Shower seat - built in;Grab bars - toilet;Hand held shower head;Grab bars - tub/shower      Prior Function Level of Independence: Independent               Hand Dominance        Extremity/Trunk Assessment   Upper Extremity Assessment Upper Extremity Assessment: Overall WFL for tasks assessed    Lower Extremity Assessment Lower Extremity Assessment: LLE deficits/detail LLE Deficits / Details: s/p TKA    Cervical / Trunk Assessment Cervical / Trunk Assessment: Normal  Communication   Communication: No difficulties  Cognition Arousal/Alertness: Awake/alert Behavior During Therapy: WFL for tasks assessed/performed Overall Cognitive Status: Within Functional Limits for tasks assessed  General Comments      Exercises Total Joint Exercises Ankle Circles/Pumps: AROM;Both;10 reps Quad Sets: AROM;Left;5 reps Heel Slides: AROM;Left;5  reps Goniometric ROM: 0-70 L knee   Assessment/Plan    PT Assessment Patient needs continued PT services  PT Problem List Decreased strength;Decreased range of motion;Decreased mobility;Decreased knowledge of precautions;Decreased activity tolerance;Pain;Decreased balance;Decreased knowledge of use of DME       PT Treatment Interventions DME instruction;Therapeutic activities;Gait training;Therapeutic exercise;Patient/family education;Stair training;Balance training;Functional mobility training    PT Goals (Current goals can be found in the Care Plan section)  Acute Rehab PT Goals Patient Stated Goal: home PT Goal Formulation: With patient Time For Goal Achievement: 02/06/18 Potential to Achieve Goals: Good    Frequency 7X/week   Barriers to discharge        Co-evaluation               AM-PAC PT "6 Clicks" Daily Activity  Outcome Measure Difficulty turning over in bed (including adjusting bedclothes, sheets and blankets)?: None Difficulty moving from lying on back to sitting on the side of the bed? : None Difficulty sitting down on and standing up from a chair with arms (e.g., wheelchair, bedside commode, etc,.)?: A Little Help needed moving to and from a bed to chair (including a wheelchair)?: A Little Help needed walking in hospital room?: A Little Help needed climbing 3-5 steps with a railing? : A Little 6 Click Score: 20    End of Session Equipment Utilized During Treatment: Gait belt Activity Tolerance: Patient tolerated treatment well Patient left: in chair;with call bell/phone within reach Nurse Communication: Mobility status      Time: 1610-9604 PT Time Calculation (min) (ACUTE ONLY): 38 min   Charges:   PT Evaluation $PT Eval Moderate Complexity: 1 Mod PT Treatments $Gait Training: 8-22 mins $Therapeutic Activity: 8-22 mins        Aida Raider, PT  Office # [phone] Pager [phone]   Ilda Foil 01/30/2018, 9:39 AM

## 2018-01-30 NOTE — Progress Notes (Signed)
   Subjective: 1 Day Post-Op Procedure(s) (LRB): LEFT TOTAL KNEE ARTHROPLASTY (Left) Patient reports pain as mild and moderate.    Objective: Vital signs in last 24 hours: Temp:  [97 F (36.1 C)-97.8 F (36.6 C)] 97.6 F (36.4 C) (10/19 0517) Pulse Rate:  [56-81] 71 (10/19 0517) Resp:  [7-17] 12 (10/18 1631) BP: (101-122)/(46-66) 111/57 (10/19 0517) SpO2:  [91 %-100 %] 96 % (10/19 0517)  Intake/Output from previous day: 10/18 0701 - 10/19 0700 In: 1200 [P.O.:200; I.V.:1000] Out: 1025 [Urine:1000; Blood:25] Intake/Output this shift: Total I/O In: 200 [P.O.:200] Out: -   Recent Labs    01/30/18 0432  HGB 12.5*   Recent Labs    01/30/18 0432  WBC 6.8  RBC 4.35  HCT 39.5  PLT 145*   Recent Labs    01/30/18 0432  NA 137  K 4.6  CL 103  CO2 24  BUN 19  CREATININE 1.13  GLUCOSE 165*  CALCIUM 8.3*   No results for input(s): LABPT, INR in the last 72 hours.  in recliner , able to contract quad. has been up.  Dg Knee Left Port  Result Date: 01/29/2018 CLINICAL DATA:  Status post left knee replacement EXAM: PORTABLE LEFT KNEE - 1-2 VIEW COMPARISON:  None. FINDINGS: Left knee prosthesis is seen. No acute bony or soft tissue abnormality is noted. IMPRESSION: Status post left knee replacement Electronically Signed   By: Alcide Clever M.D.   On: 01/29/2018 15:49    Assessment/Plan: 1 Day Post-Op Procedure(s) (LRB): LEFT TOTAL KNEE ARTHROPLASTY (Left) Up with therapy, Home Sunday after therapy works with him. [facility] coming to work with him Monday. SL IV  Eldred Manges 01/30/2018, 11:12 AM

## 2018-01-30 NOTE — Progress Notes (Signed)
Physical Therapy Treatment Patient Details Name: [name] MRN: [id number] DOB: [birth year] Today's Date: 01/30/2018    History of Present Illness Pt is a 62 y.o. male s/p L TKA.     PT Comments    HEP handouts issued. Pt progressing well with mobility. Plan is for d/c home tomorrow after AM PT session.   Follow Up Recommendations  Follow surgeon's recommendation for DC plan and follow-up therapies     Equipment Recommendations  None recommended by PT    Recommendations for Other Services       Precautions / Restrictions Precautions Precautions: Knee Restrictions LLE Weight Bearing: Weight bearing as tolerated    Mobility  Bed Mobility Overal bed mobility: Modified Independent Bed Mobility: Sit to Supine       Sit to supine: Modified independent (Device/Increase time)   General bed mobility comments: +rail  Transfers Overall transfer level: Needs assistance Equipment used: Rolling walker (2 wheeled) Transfers: Sit to/from Stand Sit to Stand: Supervision         General transfer comment: Pt demo safe technique  Ambulation/Gait Ambulation/Gait assistance: Land (Feet): 230 Feet Assistive device: Rolling walker (2 wheeled) Gait Pattern/deviations: Step-through pattern;Decreased stride length Gait velocity: decreased Gait velocity interpretation: 1.31 - 2.62 ft/sec, indicative of limited community ambulator General Gait Details: Pt demo good toe off/heel strike LLE   Stairs             Wheelchair Mobility    Modified Rankin (Stroke Patients Only)       Balance                                            Cognition Arousal/Alertness: Awake/alert Behavior During Therapy: WFL for tasks assessed/performed Overall Cognitive Status: Within Functional Limits for tasks assessed                                        Exercises Total Joint Exercises Ankle Circles/Pumps: AROM;Both;10  reps Quad Sets: AROM;Both;10 reps Heel Slides: AROM;Left;10 reps Hip ABduction/ADduction: AROM;Left;10 reps Goniometric ROM: 0-75 AROM L knee    General Comments        Pertinent Vitals/Pain Pain Assessment: 0-10 Pain Score: 3  Pain Location: L knee Pain Descriptors / Indicators: Operative site guarding;Sore Pain Intervention(s): Monitored during session;Repositioned    Home Living                      Prior Function            PT Goals (current goals can now be found in the care plan section) Acute Rehab PT Goals Patient Stated Goal: home PT Goal Formulation: With patient Time For Goal Achievement: 02/06/18 Potential to Achieve Goals: Good Progress towards PT goals: Progressing toward goals    Frequency    7X/week      PT Plan Current plan remains appropriate    Co-evaluation              AM-PAC PT "6 Clicks" Daily Activity  Outcome Measure  Difficulty turning over in bed (including adjusting bedclothes, sheets and blankets)?: None Difficulty moving from lying on back to sitting on the side of the bed? : None Difficulty sitting down on and standing up from a chair with arms (e.g., wheelchair,  bedside commode, etc,.)?: A Little Help needed moving to and from a bed to chair (including a wheelchair)?: None Help needed walking in hospital room?: A Little Help needed climbing 3-5 steps with a railing? : A Little 6 Click Score: 21    End of Session Equipment Utilized During Treatment: Gait belt Activity Tolerance: Patient tolerated treatment well Patient left: in bed;with call bell/phone within reach Nurse Communication: Mobility status PT Visit Diagnosis: Difficulty in walking, not elsewhere classified (R26.2);Pain Pain - Right/Left: Left Pain - part of body: Knee     Time: 1334-1400 PT Time Calculation (min) (ACUTE ONLY): 26 min  Charges:  $Gait Training: 8-22 mins $Therapeutic Exercise: 8-22 mins                     Aida Raider, PT   Office # [phone] Pager [phone]    Ilda Foil 01/30/2018, 3:06 PM

## 2018-01-31 LAB — GLUCOSE, CAPILLARY
Glucose-Capillary: 154 mg/dL — ABNORMAL HIGH (ref 70–99)
Glucose-Capillary: 150 mg/dL — ABNORMAL HIGH (ref 70–99)

## 2018-01-31 MED ORDER — TRIAMTERENE-HCTZ 37.5-25 MG PO TABS
1.0000 | ORAL_TABLET | Freq: Every day | ORAL | Status: DC
  Administered 2018-01-31: 1 via ORAL
  Filled 2018-01-31: qty 1

## 2018-01-31 NOTE — Progress Notes (Signed)
   Subjective: 2 Days Post-Op Procedure(s) (LRB): LEFT TOTAL KNEE ARTHROPLASTY (Left) Patient reports pain as mild.    Objective: Vital signs in last 24 hours: Temp:  [97.7 F (36.5 C)-98.4 F (36.9 C)] 97.7 F (36.5 C) (10/20 0531) Pulse Rate:  [69-81] 69 (10/20 0531) Resp:  [18] 18 (10/19 1226) BP: (114-131)/(60-68) 131/64 (10/20 0531) SpO2:  [91 %-96 %] 96 % (10/20 0531)  Intake/Output from previous day: 10/19 0701 - 10/20 0700 In: 1779.3 [P.O.:800; I.V.:979.3] Out: 3 [Urine:3] Intake/Output this shift: Total I/O In: 250 [P.O.:250] Out: -   Recent Labs    01/30/18 0432  HGB 12.5*   Recent Labs    01/30/18 0432  WBC 6.8  RBC 4.35  HCT 39.5  PLT 145*   Recent Labs    01/30/18 0432  NA 137  K 4.6  CL 103  CO2 24  BUN 19  CREATININE 1.13  GLUCOSE 165*  CALCIUM 8.3*   No results for input(s): LABPT, INR in the last 72 hours.  Neurologically intact No results found.  Assessment/Plan: 2 Days Post-Op Procedure(s) (LRB): LEFT TOTAL KNEE ARTHROPLASTY (Left) Up with therapy, home today. HHPT already arranged. Rx on chart.   Eldred Manges 01/31/2018, 11:28 AM

## 2018-01-31 NOTE — Plan of Care (Signed)

## 2018-01-31 NOTE — Transfer of Care (Signed)
Immediate Anesthesia Transfer of Care Note  Patient: [name]  Procedure(s) Performed: LEFT TOTAL KNEE ARTHROPLASTY (Left Knee)  Patient Location: PACU  Anesthesia Type:Spinal  Level of Consciousness: awake, alert  and oriented  Airway & Oxygen Therapy: Patient Spontanous Breathing  Post-op Assessment: Report given to RN and Post -op Vital signs reviewed and stable  Post vital signs: Reviewed and stable  Last Vitals:  Vitals Value Taken Time  BP    Temp    Pulse    Resp    SpO2      Last Pain:  Vitals:   01/31/18 0531  TempSrc: Oral  PainSc:       Patients Stated Pain Goal: 2 (01/30/18 1500)  Complications: No apparent anesthesia complications

## 2018-01-31 NOTE — Progress Notes (Signed)
Physical Therapy Treatment Patient Details Name: [name] MRN: [id number] DOB: [birth year] Today's Date: 01/31/2018    History of Present Illness Pt is a 62 y.o. male s/p L TKA.     PT Comments    Excellent progress with mobility. All goals met. All education complete. Pt has HEP handouts for home. Pt to d/c home today.   Follow Up Recommendations  Follow surgeon's recommendation for DC plan and follow-up therapies     Equipment Recommendations  None recommended by PT    Recommendations for Other Services       Precautions / Restrictions Precautions Precautions: Knee Precaution Comments: Pt educated on no pillow under knee. Restrictions Weight Bearing Restrictions: Yes LLE Weight Bearing: Weight bearing as tolerated    Mobility  Bed Mobility Overal bed mobility: Modified Independent                Transfers   Equipment used: Rolling walker (2 wheeled)   Sit to Stand: Supervision         General transfer comment: Pt demo safe technique  Ambulation/Gait Ambulation/Gait assistance: Supervision Gait Distance (Feet): 250 Feet Assistive device: Rolling walker (2 wheeled) Gait Pattern/deviations: Step-through pattern;Decreased stride length Gait velocity: decreased Gait velocity interpretation: 1.31 - 2.62 ft/sec, indicative of limited community ambulator General Gait Details: Pt demo good toe off/heel strike LLE   Stairs             Wheelchair Mobility    Modified Rankin (Stroke Patients Only)       Balance   Sitting-balance support: No upper extremity supported;Feet supported Sitting balance-Leahy Scale: Good     Standing balance support: Bilateral upper extremity supported;During functional activity Standing balance-Leahy Scale: Fair                              Cognition Arousal/Alertness: Awake/alert Behavior During Therapy: WFL for tasks assessed/performed Overall Cognitive Status: Within Functional Limits for  tasks assessed                                        Exercises Total Joint Exercises Ankle Circles/Pumps: AROM;Both;20 reps Quad Sets: AROM;Left;20 reps Short Arc Quad: AROM;Left;10 reps Heel Slides: AROM;Left;10 reps Hip ABduction/ADduction: AROM;Left;10 reps Goniometric ROM: 0-80 L knee    General Comments        Pertinent Vitals/Pain Pain Assessment: 0-10 Pain Score: 5  Pain Location: L knee Pain Descriptors / Indicators: Operative site guarding;Sore Pain Intervention(s): Patient requesting pain meds-RN notified;Monitored during session;Repositioned;Ice applied    Home Living                      Prior Function            PT Goals (current goals can now be found in the care plan section) Acute Rehab PT Goals Patient Stated Goal: home PT Goal Formulation: With patient Time For Goal Achievement: 02/06/18 Potential to Achieve Goals: Good Progress towards PT goals: Goals met/education completed, patient discharged from PT    Frequency    7X/week      PT Plan Current plan remains appropriate    Co-evaluation              AM-PAC PT "6 Clicks" Daily Activity  Outcome Measure  Difficulty turning over in bed (including adjusting bedclothes, sheets and blankets)?: None Difficulty moving  from lying on back to sitting on the side of the bed? : None Difficulty sitting down on and standing up from a chair with arms (e.g., wheelchair, bedside commode, etc,.)?: A Little Help needed moving to and from a bed to chair (including a wheelchair)?: None Help needed walking in hospital room?: None Help needed climbing 3-5 steps with a railing? : A Little 6 Click Score: 22    End of Session Equipment Utilized During Treatment: Gait belt Activity Tolerance: Patient tolerated treatment well Patient left: in chair;with call bell/phone within reach;Other (comment)(in bone foam) Nurse Communication: Mobility status Pain - Right/Left: Left Pain -  part of body: Knee     Time: 4383-8184 PT Time Calculation (min) (ACUTE ONLY): 25 min  Charges:  $Gait Training: 8-22 mins $Therapeutic Exercise: 8-22 mins                     Lorrin Goodell, PT  Office # [phone] Pager [phone]    [name] 01/31/2018, 9:34 AM

## 2018-01-31 NOTE — Progress Notes (Signed)
Reviewed discharge papers and medications with patient with full understanding, states has all equipment at home

## 2018-02-01 ENCOUNTER — Encounter (HOSPITAL_COMMUNITY): Payer: Self-pay | Admitting: Orthopaedic Surgery

## 2018-02-01 NOTE — Anesthesia Postprocedure Evaluation (Signed)
Anesthesia Post Note  Patient: [name]  Procedure(s) Performed: LEFT TOTAL KNEE ARTHROPLASTY (Left Knee)     Patient location during evaluation: PACU Anesthesia Type: Spinal Level of consciousness: oriented and awake and alert Pain management: pain level controlled Vital Signs Assessment: post-procedure vital signs reviewed and stable Respiratory status: spontaneous breathing and respiratory function stable Cardiovascular status: blood pressure returned to baseline and stable Postop Assessment: no headache, no backache and no apparent nausea or vomiting Anesthetic complications: no    Last Vitals:  Vitals:   01/30/18 2150 01/31/18 0531  BP: 128/61 131/64  Pulse: 79 69  Resp:    Temp:  36.5 C  SpO2:  96%    Last Pain:  Vitals:   01/31/18 0900  TempSrc:   PainSc: 2                  Lowella Curb

## 2018-02-02 NOTE — Discharge Summary (Signed)
Physician Discharge Summary      Patient ID: [name] MRN: [id number] DOB/AGE: 61-Feb-1957 61 y.o.  Admit date: 01/29/2018 Discharge date: 02/02/2018  Admission Diagnoses:  Primary osteoarthritis of left knee  Discharge Diagnoses:  Principal Problem:   Primary osteoarthritis of left knee Active Problems:   Total knee replacement status   Past Medical History:  Diagnosis Date  . Arthritis    "ankles, knees" (01/29/2018)  . CAD in native artery    cath report from 02/2017 indicates patient had a history of RCA stent >10 years prior to that. At time of that cath he was found to have EF 70%, 20-30% ostial LAD, 30-40% prox LAD, 30-40% Cx, 80-90% Cx beyond OM1, totally occluded RCA with left-to-right collaterals.  . Diabetes mellitus type 2 in obese (HCC)   . GERD (gastroesophageal reflux disease)   . Hyperlipidemia   . Hypertension   . Morbid obesity (HCC)   . OSA on CPAP     Surgeries: Procedure(s): LEFT TOTAL KNEE ARTHROPLASTY on 01/29/2018   Consultants (if any):   Discharged Condition: Improved  Hospital Course: [name] is an 62 y.o. male who was admitted 01/29/2018 with a diagnosis of Primary osteoarthritis of left knee and went to the operating room on 01/29/2018 and underwent the above named procedures.    He was given perioperative antibiotics:  Anti-infectives (From admission, onward)   Start     Dose/Rate Route Frequency Ordered Stop   01/29/18 2200  sulfamethoxazole-trimethoprim (BACTRIM DS,SEPTRA DS) 800-160 MG per tablet 1 tablet  Status:  Discontinued     1 tablet Oral Every 12 hours 01/29/18 1716 01/31/18 1640   01/29/18 1730  ceFAZolin (ANCEF) 3 g in dextrose 5 % 50 mL IVPB     3 g 100 mL/hr over 30 Minutes Intravenous Every 6 hours 01/29/18 1716 01/30/18 1129   01/29/18 1311  vancomycin (VANCOCIN) powder  Status:  Discontinued       As needed 01/29/18 1312 01/29/18 1443   01/29/18 1015  ceFAZolin (ANCEF) 3 g in dextrose 5 % 50 mL IVPB     3 g 100 mL/hr over 30 Minutes Intravenous On call to O.R. 01/29/18 1000 01/29/18 1235   01/29/18 0958  ceFAZolin (ANCEF) 2-4 GM/100ML-% IVPB    Note to Pharmacy:  Sandi Raveling   : cabinet override      01/29/18 0958 01/29/18 2159   01/29/18 0000  sulfamethoxazole-trimethoprim (BACTRIM DS,SEPTRA DS) 800-160 MG tablet     1 tablet Oral 2 times daily 01/29/18 1207      .  He was given sequential compression devices, early ambulation, and aspirin for DVT prophylaxis.  He benefited maximally from the hospital stay and there were no complications.    Recent vital signs:  Vitals:   01/30/18 2150 01/31/18 0531  BP: 128/61 131/64  Pulse: 79 69  Resp:    Temp:  97.7 F (36.5 C)  SpO2:  96%    Recent laboratory studies:  Lab Results  Component Value Date   HGB 12.5 (L) 01/30/2018   HGB 14.6 01/21/2018   HGB 14.5 04/15/2017   Lab Results  Component Value Date   WBC 6.8 01/30/2018   PLT 145 (L) 01/30/2018   Lab Results  Component Value Date   INR 1.02 01/21/2018   Lab Results  Component Value Date   NA 137 01/30/2018   K 4.6 01/30/2018   CL 103 01/30/2018   CO2 24 01/30/2018   BUN 19 01/30/2018  CREATININE 1.13 01/30/2018   GLUCOSE 165 (H) 01/30/2018    Discharge Medications:   Allergies as of 01/31/2018      Reactions   Penicillins Rash   Has patient had a PCN reaction causing immediate rash, facial/tongue/throat swelling, SOB or lightheadedness with hypotension: Yes Has patient had a PCN reaction causing severe rash involving mucus membranes or skin necrosis: Unk Has patient had a PCN reaction that required hospitalization: Unk Has patient had a PCN reaction occurring within the last 10 years: No If all of the above answers are "NO", then may proceed with Cephalosporin use.      Medication List    STOP taking these medications   aspirin 325 MG tablet Replaced by:  aspirin EC 325 MG tablet You also have another medication with the same name that you need  to continue taking as instructed.     TAKE these medications   acetaminophen 650 MG CR tablet Commonly known as:  TYLENOL Take 1,300 mg by mouth at bedtime.   aspirin EC 325 MG tablet Take 325 mg by mouth daily. What changed:    Another medication with the same name was added. Make sure you understand how and when to take each.  Another medication with the same name was removed. Continue taking this medication, and follow the directions you see here.   aspirin EC 325 MG tablet Take 1 tablet (325 mg total) by mouth daily. What changed:  You were already taking a medication with the same name, and this prescription was added. Make sure you understand how and when to take each. Replaces:  aspirin 325 MG tablet   atorvastatin 80 MG tablet Commonly known as:  LIPITOR Take 80 mg by mouth at bedtime.   BIOFREEZE 4 % Gel Generic drug:  Menthol (Topical Analgesic) Apply 1 application topically See admin instructions. Apply to both feet at bedtime for neuropathy   celecoxib 200 MG capsule Commonly known as:  CELEBREX TAKE 1 CAPSULE BY MOUTH EVERY DAY What changed:    how much to take  when to take this   COSAMIN DS PO Take 2 tablets by mouth daily with breakfast.   isosorbide mononitrate 120 MG 24 hr tablet Commonly known as:  IMDUR TAKE 1 TABLET(120 MG) BY MOUTH DAILY What changed:  See the new instructions.   l-methylfolate-B6-B12 3-35-2 MG Tabs tablet Commonly known as:  METANX One tablet by mouth two times daily. What changed:    how much to take  how to take this  when to take this  additional instructions   lisinopril 5 MG tablet Commonly known as:  PRINIVIL,ZESTRIL Take 5 mg by mouth at bedtime.   metFORMIN 500 MG 24 hr tablet Commonly known as:  GLUCOPHAGE-XR Take 500 mg by mouth at bedtime.   methocarbamol 750 MG tablet Commonly known as:  ROBAXIN Take 1 tablet (750 mg total) by mouth 2 (two) times daily as needed for muscle spasms.   metoprolol  tartrate 50 MG tablet Commonly known as:  LOPRESSOR TAKE ONE TABLET BY MOUTH TWICE DAILY   multivitamin tablet Take 1 tablet by mouth daily.   nitroGLYCERIN 0.4 MG SL tablet Commonly known as:  NITROSTAT Place 1 tablet (0.4 mg total) under the tongue every 5 (five) minutes x 3 doses as needed for chest pain.   NON FORMULARY [facility]  Peripheral Neuropathy Cream- Bupivacaine 1%, Doxepin 3%, Gabapentin 6%, Pentoxifylline 3%, Topiramate 1% Apply 1-2 grams to affected area 3-4 times daily Qty. 120 gm  3 refills   omeprazole 20 MG capsule Commonly known as:  PRILOSEC Take 20 mg by mouth daily.   ondansetron 4 MG tablet Commonly known as:  ZOFRAN Take 1-2 tablets (4-8 mg total) by mouth every 8 (eight) hours as needed for nausea or vomiting.   oxyCODONE 5 MG immediate release tablet Commonly known as:  Oxy IR/ROXICODONE Take 1-3 tablets (5-15 mg total) by mouth every 4 (four) hours as needed.   pregabalin 150 MG capsule Commonly known as:  LYRICA Take 1 capsule (150 mg total) by mouth 2 (two) times daily.   promethazine 25 MG tablet Commonly known as:  PHENERGAN Take 1 tablet (25 mg total) by mouth every 6 (six) hours as needed for nausea.   senna-docusate 8.6-50 MG tablet Commonly known as:  Senokot-S Take 1 tablet by mouth at bedtime as needed.   sulfamethoxazole-trimethoprim 800-160 MG tablet Commonly known as:  BACTRIM DS,SEPTRA DS Take 1 tablet by mouth 2 (two) times daily.   triamterene-hydrochlorothiazide 75-50 MG tablet Commonly known as:  MAXZIDE Take 0.5 tablets by mouth daily.   VISINE OP Place 1 drop into both eyes daily as needed (for dry or irritated eyes).   Vitamin D3 2000 units Tabs Take 2,000 Units by mouth daily.   WELCHOL 625 MG tablet Generic drug:  colesevelam Take 1,875 mg by mouth at bedtime.     ASK your doctor about these medications   oxyCODONE 10 mg 12 hr tablet Commonly known as:  OXYCONTIN Take 1 tablet (10 mg total) by  mouth every 12 (twelve) hours for 3 days. Ask about: Should I take this medication?       Diagnostic Studies: Dg Chest 2 View  Result Date: 01/22/2018 CLINICAL DATA:  Preop respiratory exam EXAM: CHEST - 2 VIEW COMPARISON:  None. FINDINGS: Heart and mediastinal contours are within normal limits. No focal opacities or effusions. No acute bony abnormality. IMPRESSION: No active cardiopulmonary disease. Electronically Signed   By: Charlett Nose M.D.   On: 01/22/2018 08:25   Dg Knee Left Port  Result Date: 01/29/2018 CLINICAL DATA:  Status post left knee replacement EXAM: PORTABLE LEFT KNEE - 1-2 VIEW COMPARISON:  None. FINDINGS: Left knee prosthesis is seen. No acute bony or soft tissue abnormality is noted. IMPRESSION: Status post left knee replacement Electronically Signed   By: Alcide Clever M.D.   On: 01/29/2018 15:49    Disposition:     Follow-up Information    Tarry Kos, MD In 2 weeks.   Specialty:  Orthopedic Surgery Why:  For suture removal, For wound re-check Contact information: [address] [place] [address] [phone]            Signed: Glee Arvin 02/02/2018, 9:32 AM

## 2018-02-07 ENCOUNTER — Encounter (HOSPITAL_COMMUNITY): Payer: Self-pay

## 2018-02-07 ENCOUNTER — Emergency Department (HOSPITAL_COMMUNITY)
Admission: EM | Admit: 2018-02-07 | Discharge: 2018-02-07 | Disposition: A | Discharge status: Home / Self Care | Payer: Medicare Other | Attending: Emergency Medicine | Admitting: Emergency Medicine

## 2018-02-07 ENCOUNTER — Emergency Department (HOSPITAL_COMMUNITY): Payer: Medicare Other

## 2018-02-07 DIAGNOSIS — I10 Essential (primary) hypertension: Secondary | ICD-10-CM | POA: Insufficient documentation

## 2018-02-07 DIAGNOSIS — Z955 Presence of coronary angioplasty implant and graft: Secondary | ICD-10-CM | POA: Insufficient documentation

## 2018-02-07 DIAGNOSIS — Z79899 Other long term (current) drug therapy: Secondary | ICD-10-CM | POA: Diagnosis not present

## 2018-02-07 DIAGNOSIS — Z7982 Long term (current) use of aspirin: Secondary | ICD-10-CM | POA: Insufficient documentation

## 2018-02-07 DIAGNOSIS — R509 Fever, unspecified: Secondary | ICD-10-CM | POA: Diagnosis present

## 2018-02-07 DIAGNOSIS — Z7984 Long term (current) use of oral hypoglycemic drugs: Secondary | ICD-10-CM | POA: Insufficient documentation

## 2018-02-07 DIAGNOSIS — E119 Type 2 diabetes mellitus without complications: Secondary | ICD-10-CM | POA: Diagnosis not present

## 2018-02-07 DIAGNOSIS — I2510 Atherosclerotic heart disease of native coronary artery without angina pectoris: Secondary | ICD-10-CM | POA: Diagnosis not present

## 2018-02-07 DIAGNOSIS — Z96652 Presence of left artificial knee joint: Secondary | ICD-10-CM | POA: Diagnosis not present

## 2018-02-07 LAB — URINALYSIS, ROUTINE W REFLEX MICROSCOPIC
Bilirubin Urine: NEGATIVE
GLUCOSE, UA: NEGATIVE mg/dL
HGB URINE DIPSTICK: NEGATIVE
Ketones, ur: NEGATIVE mg/dL
Leukocytes, UA: NEGATIVE
Nitrite: NEGATIVE
Protein, ur: NEGATIVE mg/dL
Specific Gravity, Urine: 1.015 (ref 1.005–1.030)
pH: 5 (ref 5.0–8.0)

## 2018-02-07 LAB — CBC WITH DIFFERENTIAL/PLATELET
ABS IMMATURE GRANULOCYTES: 0.04 10*3/uL (ref 0.00–0.07)
BASOS ABS: 0 10*3/uL (ref 0.0–0.1)
Basophils Relative: 1 %
EOS ABS: 0.1 10*3/uL (ref 0.0–0.5)
Eosinophils Relative: 2 %
HEMATOCRIT: 40.2 % (ref 39.0–52.0)
Hemoglobin: 12.8 g/dL — ABNORMAL LOW (ref 13.0–17.0)
IMMATURE GRANULOCYTES: 1 %
LYMPHS ABS: 0.4 10*3/uL — AB (ref 0.7–4.0)
LYMPHS PCT: 7 %
MCH: 28.5 pg (ref 26.0–34.0)
MCHC: 31.8 g/dL (ref 30.0–36.0)
MCV: 89.5 fL (ref 80.0–100.0)
Monocytes Absolute: 0.6 10*3/uL (ref 0.1–1.0)
Monocytes Relative: 9 %
NEUTROS ABS: 5.1 10*3/uL (ref 1.7–7.7)
NEUTROS PCT: 80 %
NRBC: 0 % (ref 0.0–0.2)
Platelets: 152 10*3/uL (ref 150–400)
RBC: 4.49 MIL/uL (ref 4.22–5.81)
RDW: 14.6 % (ref 11.5–15.5)
WBC: 6.3 10*3/uL (ref 4.0–10.5)

## 2018-02-07 LAB — I-STAT CHEM 8, ED
BUN: 25 mg/dL — AB (ref 8–23)
CALCIUM ION: 1.12 mmol/L — AB (ref 1.15–1.40)
CREATININE: 1.3 mg/dL — AB (ref 0.61–1.24)
Chloride: 104 mmol/L (ref 98–111)
Glucose, Bld: 119 mg/dL — ABNORMAL HIGH (ref 70–99)
HCT: 38 % — ABNORMAL LOW (ref 39.0–52.0)
Hemoglobin: 12.9 g/dL — ABNORMAL LOW (ref 13.0–17.0)
Potassium: 4.7 mmol/L (ref 3.5–5.1)
Sodium: 136 mmol/L (ref 135–145)
TCO2: 25 mmol/L (ref 22–32)

## 2018-02-07 LAB — COMPREHENSIVE METABOLIC PANEL
ALBUMIN: 3.1 g/dL — AB (ref 3.5–5.0)
ALT: 26 U/L (ref 0–44)
AST: 44 U/L — AB (ref 15–41)
Alkaline Phosphatase: 78 U/L (ref 38–126)
Anion gap: 9 (ref 5–15)
BILIRUBIN TOTAL: 1.3 mg/dL — AB (ref 0.3–1.2)
BUN: 19 mg/dL (ref 8–23)
CO2: 22 mmol/L (ref 22–32)
Calcium: 8.9 mg/dL (ref 8.9–10.3)
Chloride: 105 mmol/L (ref 98–111)
Creatinine, Ser: 1.24 mg/dL (ref 0.61–1.24)
GFR calc Af Amer: >60 mL/min (ref >60)
GFR calc non Af Amer: >60 mL/min (ref >60)
GLUCOSE: 125 mg/dL — AB (ref 70–99)
POTASSIUM: 4.7 mmol/L (ref 3.5–5.1)
Sodium: 136 mmol/L (ref 135–145)
TOTAL PROTEIN: 6.9 g/dL (ref 6.5–8.1)

## 2018-02-07 LAB — I-STAT CG4 LACTIC ACID, ED: Lactic Acid, Venous: 2.24 mmol/L (ref 0.5–1.9)

## 2018-02-07 MED ORDER — OXYCODONE HCL ER 10 MG PO T12A: 10.0000 mg | EXTENDED_RELEASE_TABLET | Freq: Two times a day (BID) | ORAL | 0 refills | Status: DC

## 2018-02-07 MED ORDER — OXYCODONE HCL ER 10 MG PO T12A: 10.0000 mg | EXTENDED_RELEASE_TABLET | Freq: Once | ORAL | Status: DC

## 2018-02-07 MED ORDER — SODIUM CHLORIDE 0.9 % IV BOLUS
1000.0000 mL | Freq: Once | INTRAVENOUS | Status: AC
  Administered 2018-02-07: 1000 mL via INTRAVENOUS

## 2018-02-07 NOTE — ED Triage Notes (Signed)
Pt. Coming by EMS from home with reports of a fever that started 1500 today. Pt. Reports temp. Was 100.1. Pt. Took 1000 of tylenol at 1530. Pt. Had recent left knee replacement. Bandage is dry and clean. Left knee feels hot to touch. Pt. A/O x4. Pt. Able to bare weight on knee.

## 2018-02-07 NOTE — ED Provider Notes (Signed)
[facility] EMERGENCY DEPARTMENT Provider Note   CSN: [id number] Arrival date & time: 02/07/18  1947     History   Chief Complaint No chief complaint on file.   HPI [name] is a 62 y.o. male.  Patient complains of fever and chills.   No cough no sore throat.  Patient recently had knee surgery and he states that his knee does not hurt anymore than it has normally and is not more swollen  The history is provided by the patient. No language interpreter was used.  Fever   This is a new problem. The current episode started 3 to 5 hours ago. The problem occurs constantly. The problem has not changed since onset.The maximum temperature noted was 100 to 100.9 F. Pertinent negatives include no chest pain, no diarrhea, no congestion, no headaches and no cough. He has tried nothing for the symptoms. The treatment provided no relief.    Past Medical History:  Diagnosis Date  . Arthritis    "ankles, knees" (01/29/2018)  . CAD in native artery    cath report from 02/2017 indicates patient had a history of RCA stent >10 years prior to that. At time of that cath he was found to have EF 70%, 20-30% ostial LAD, 30-40% prox LAD, 30-40% Cx, 80-90% Cx beyond OM1, totally occluded RCA with left-to-right collaterals.  . Diabetes mellitus type 2 in obese (HCC)   . GERD (gastroesophageal reflux disease)   . Hyperlipidemia   . Hypertension   . Morbid obesity (HCC)   . OSA on CPAP     Patient Active Problem List   Diagnosis Date Noted  . Primary osteoarthritis of left knee 01/29/2018  . Total knee replacement status 01/29/2018  . Primary gout 01/11/2014  . CAD (coronary artery disease), native coronary artery 07/26/2013  . Essential hypertension 07/26/2013  . Hyperlipidemia 07/26/2013  . Obstructive sleep apnea 07/26/2013    Past Surgical History:  Procedure Laterality Date  . CARDIAC CATHETERIZATION  2008  . CORONARY ANGIOPLASTY WITH STENT PLACEMENT  1997  . JOINT  REPLACEMENT    . KNEE ARTHROSCOPY Bilateral    "meniscus repairs"  . TONSILLECTOMY    . TOTAL KNEE ARTHROPLASTY Left 01/29/2018  . TOTAL KNEE ARTHROPLASTY Left 01/29/2018   Procedure: LEFT TOTAL KNEE ARTHROPLASTY;  Surgeon: Tarry Kos, MD;  Location: [facility] OR;  Service: Orthopedics;  Laterality: Left;  . UVULOPALATOPHARYNGOPLASTY  ~ 1994        Home Medications    Prior to Admission medications   Medication Sig Start Date End Date Taking? Authorizing Provider  acetaminophen (TYLENOL) 650 MG CR tablet Take 1,300 mg by mouth at bedtime.    [provider]  aspirin EC 325 MG tablet Take 1 tablet (325 mg total) by mouth daily. 01/29/18 01/29/19  Tarry Kos, MD  aspirin EC 325 MG tablet Take 325 mg by mouth daily.    [provider]  atorvastatin (LIPITOR) 80 MG tablet Take 80 mg by mouth at bedtime.  02/09/13   [provider]  celecoxib (CELEBREX) 200 MG capsule TAKE 1 CAPSULE BY MOUTH EVERY DAY Patient taking differently: Take 200 mg by mouth at bedtime.  08/11/17   Vivi Barrack, DPM  Cholecalciferol (VITAMIN D3) 2000 units TABS Take 2,000 Units by mouth daily.    [provider]  Glucosamine-Chondroitin (COSAMIN DS PO) Take 2 tablets by mouth daily with breakfast.     [provider]  isosorbide mononitrate (IMDUR) 120  MG 24 hr tablet TAKE 1 TABLET(120 MG) BY MOUTH DAILY Patient taking differently: Take 120 mg by mouth daily.  06/11/17   Lyn Records, MD  l-methylfolate-B6-B12 [facility]) 3-35-2 MG TABS tablet One tablet by mouth two times daily. Patient taking differently: Take 1 tablet by mouth daily.  04/21/17   Vivi Barrack, DPM  lisinopril (PRINIVIL,ZESTRIL) 5 MG tablet Take 5 mg by mouth at bedtime.  07/03/13   [provider]  Menthol, Topical Analgesic, (BIOFREEZE) 4 % GEL Apply 1 application topically See admin instructions. Apply to both feet at bedtime for neuropathy    [provider]  metFORMIN  (GLUCOPHAGE-XR) 500 MG 24 hr tablet Take 500 mg by mouth at bedtime.  01/19/17   [provider]  methocarbamol (ROBAXIN) 750 MG tablet Take 1 tablet (750 mg total) by mouth 2 (two) times daily as needed for muscle spasms. 01/29/18   Tarry Kos, MD  metoprolol (LOPRESSOR) 50 MG tablet TAKE ONE TABLET BY MOUTH TWICE DAILY Patient taking differently: Take 50 mg by mouth 2 (two) times daily.  03/11/16   Lyn Records, MD  Multiple Vitamin (MULTIVITAMIN) tablet Take 1 tablet by mouth daily.    [provider]  nitroGLYCERIN (NITROSTAT) 0.4 MG SL tablet Place 1 tablet (0.4 mg total) under the tongue every 5 (five) minutes x 3 doses as needed for chest pain. 11/28/16   Lyn Records, MD  NON [facility]  Peripheral Neuropathy Cream- Bupivacaine 1%, Doxepin 3%, Gabapentin 6%, Pentoxifylline 3%, Topiramate 1% Apply 1-2 grams to affected area 3-4 times daily Qty. 120 gm 3 refills    [provider]  omeprazole (PRILOSEC) 20 MG capsule Take 20 mg by mouth daily.  02/03/13   [provider]  ondansetron (ZOFRAN) 4 MG tablet Take 1-2 tablets (4-8 mg total) by mouth every 8 (eight) hours as needed for nausea or vomiting. 01/29/18   Tarry Kos, MD  oxyCODONE (OXY IR/ROXICODONE) 5 MG immediate release tablet Take 1-3 tablets (5-15 mg total) by mouth every 4 (four) hours as needed. 01/29/18   Tarry Kos, MD  pregabalin (LYRICA) 150 MG capsule Take 1 capsule (150 mg total) by mouth 2 (two) times daily. 03/16/17   Vivi Barrack, DPM  promethazine (PHENERGAN) 25 MG tablet Take 1 tablet (25 mg total) by mouth every 6 (six) hours as needed for nausea. 01/29/18   Tarry Kos, MD  senna-docusate (SENOKOT S) 8.6-50 MG tablet Take 1 tablet by mouth at bedtime as needed. 01/29/18   Tarry Kos, MD  sulfamethoxazole-trimethoprim (BACTRIM DS,SEPTRA DS) 800-160 MG tablet Take 1 tablet by mouth 2 (two) times daily. 01/29/18   Tarry Kos, MD    Tetrahydrozoline HCl (VISINE OP) Place 1 drop into both eyes daily as needed (for dry or irritated eyes).     [provider]  triamterene-hydrochlorothiazide (MAXZIDE) 75-50 MG per tablet Take 0.5 tablets by mouth daily.  01/06/13   [provider]  WELCHOL 625 MG tablet Take 1,875 mg by mouth at bedtime.  05/19/13   [provider]    Family History Family History  Problem Relation Age of Onset  . Heart attack Mother   . Heart attack Father     Social History Social History   Tobacco Use  . Smoking status: Never Smoker  . Smokeless tobacco: Never Used  Substance Use Topics  . Alcohol use: Yes    Comment: 01/29/2018 "may have 1 beer/month; if  that"  . Drug use: Never     Allergies   Penicillins   Review of Systems Review of Systems  Constitutional: Positive for fever. Negative for appetite change and fatigue.  HENT: Negative for congestion, ear discharge and sinus pressure.   Eyes: Negative for discharge.  Respiratory: Negative for cough.   Cardiovascular: Negative for chest pain.  Gastrointestinal: Negative for abdominal pain and diarrhea.  Genitourinary: Negative for frequency and hematuria.  Musculoskeletal: Negative for back pain.  Skin: Negative for rash.  Neurological: Negative for seizures and headaches.  Psychiatric/Behavioral: Negative for hallucinations.     Physical Exam Updated Vital Signs BP (!) 158/89   Pulse 86   Temp 100 F (37.8 C) (Oral)   Resp 20   Ht 5\' 8"  (1.727 m)   Wt 129.3 kg   SpO2 96%   BMI 43.33 kg/m   Physical Exam  Constitutional: He is oriented to person, place, and time. He appears well-developed.  HENT:  Head: Normocephalic.  Eyes: Conjunctivae and EOM are normal. No scleral icterus.  Neck: Neck supple. No thyromegaly present.  Cardiovascular: Normal rate and regular rhythm. Exam reveals no gallop and no friction rub.  No murmur heard. Pulmonary/Chest: No stridor. He has no wheezes. He has no  rales. He exhibits no tenderness.  Abdominal: He exhibits no distension. There is no tenderness. There is no rebound.  Musculoskeletal: Normal range of motion. He exhibits no edema.  Swollen left knee mild tenderness  Lymphadenopathy:    He has no cervical adenopathy.  Neurological: He is oriented to person, place, and time. He exhibits normal muscle tone. Coordination normal.  Skin: No rash noted. No erythema.  Psychiatric: He has a normal mood and affect. His behavior is normal.     ED Treatments / Results  Labs (all labs ordered are listed, but only abnormal results are displayed) Labs Reviewed  CBC WITH DIFFERENTIAL/PLATELET - Abnormal; Notable for the following components:      Result Value   Hemoglobin 12.8 (*)    Lymphs Abs 0.4 (*)    All other components within normal limits  COMPREHENSIVE METABOLIC PANEL - Abnormal; Notable for the following components:   Glucose, Bld 125 (*)    Albumin 3.1 (*)    [name] 44 (*)    Total Bilirubin 1.3 (*)    All other components within normal limits  I-STAT CHEM 8, ED - Abnormal; Notable for the following components:   BUN 25 (*)    Creatinine, Ser 1.30 (*)    Glucose, Bld 119 (*)    Calcium, Ion 1.12 (*)    Hemoglobin 12.9 (*)    HCT 38.0 (*)    All other components within normal limits  I-STAT CG4 LACTIC ACID, ED - Abnormal; Notable for the following components:   Lactic Acid, Venous 2.24 (*)    All other components within normal limits  CULTURE, BLOOD (ROUTINE X 2)  CULTURE, BLOOD (ROUTINE X 2)  URINE CULTURE  URINALYSIS, ROUTINE W REFLEX MICROSCOPIC  I-STAT CG4 LACTIC ACID, ED    EKG None  Radiology Dg Chest Port 1 View  Result Date: 02/07/2018 CLINICAL DATA:  reports of a fever that started 1500 today. Pt. Reports temp. Was 100.1. Pt. Took 1000 milligrams of Tylenol at 1530. Pt. Had recent left knee replacement. Bandage is dry and clean. Left knee feels hot to touch. EXAM: PORTABLE CHEST 1 VIEW COMPARISON:  01/21/2018  FINDINGS: Heart size is accentuated by the portable AP technique. The lungs are  free of focal consolidations and pleural effusions. No pulmonary edema. IMPRESSION: No active disease. Electronically Signed   By: Norva Pavlov M.D.   On: 02/07/2018 20:33    Procedures Procedures (including critical care time)  Medications Ordered in ED Medications  sodium chloride 0.9 % bolus 1,000 mL (1,000 mLs Intravenous New Bag/Given 02/07/18 2118)     Initial Impression / Assessment and Plan / ED Course  I have reviewed the triage vital signs and the nursing notes.  Pertinent labs & imaging results that were available during my care of the patient were reviewed by me and considered in my medical decision making (see chart for details).   Recently had knee replacement.  I spoke with orthopedics and they felt that the fever was not coming from his knee.  Labs essentially unremarkable except for mild elevation lactic acid.  Urinalysis pending   Final Clinical Impressions(s) / ED Diagnoses   Final diagnoses:  Fever and chills    ED Discharge Orders    None       Bethann Berkshire, MD 02/07/18 2203

## 2018-02-07 NOTE — Discharge Instructions (Addendum)
Continue your antibiotics. Follow-up with Dr. Roda Shutters as scheduled or sooner if problems occur. Return here for any new/acute changes.

## 2018-02-07 NOTE — ED Provider Notes (Signed)
Received sign out from Dr. Estell Harpin at shift change.  See prior notes for full H&P.  Briefly, 62 y.o. M here with fever.  Temp on arrival 100.55F.  Did take tylenol.  Had recent knee replacement surgery.  Labs overall reassuring, mildly elevated lactate.  Was given IVF. Orthopedics was consulted, did not feel knee was source of infection.  Plan:  UA pending.  If no clear source, recommended for course of broad coverage abx such as levaquin, etc.  Results for orders placed or performed during the hospital encounter of 02/07/18  CBC with Differential/Platelet  Result Value Ref Range   WBC 6.3 4.0 - 10.5 K/uL   RBC 4.49 4.22 - 5.81 MIL/uL   Hemoglobin 12.8 (L) 13.0 - 17.0 g/dL   HCT 40.9 81.1 - 91.4 %   MCV 89.5 80.0 - 100.0 fL   MCH 28.5 26.0 - 34.0 pg   MCHC 31.8 30.0 - 36.0 g/dL   RDW 78.2 95.6 - 21.3 %   Platelets 152 150 - 400 K/uL   nRBC 0.0 0.0 - 0.2 %   Neutrophils Relative % 80 %   Neutro Abs 5.1 1.7 - 7.7 K/uL   Lymphocytes Relative 7 %   Lymphs Abs 0.4 (L) 0.7 - 4.0 K/uL   Monocytes Relative 9 %   Monocytes Absolute 0.6 0.1 - 1.0 K/uL   Eosinophils Relative 2 %   Eosinophils Absolute 0.1 0.0 - 0.5 K/uL   Basophils Relative 1 %   Basophils Absolute 0.0 0.0 - 0.1 K/uL   Immature Granulocytes 1 %   Abs Immature Granulocytes 0.04 0.00 - 0.07 K/uL  Comprehensive metabolic panel  Result Value Ref Range   Sodium 136 135 - 145 mmol/L   Potassium 4.7 3.5 - 5.1 mmol/L   Chloride 105 98 - 111 mmol/L   CO2 22 22 - 32 mmol/L   Glucose, Bld 125 (H) 70 - 99 mg/dL   BUN 19 8 - 23 mg/dL   Creatinine, Ser 0.86 0.61 - 1.24 mg/dL   Calcium 8.9 8.9 - 57.8 mg/dL   Total Protein 6.9 6.5 - 8.1 g/dL   Albumin 3.1 (L) 3.5 - 5.0 g/dL   [name] 44 (H) 15 - 41 U/L   ALT 26 0 - 44 U/L   Alkaline Phosphatase 78 38 - 126 U/L   Total Bilirubin 1.3 (H) 0.3 - 1.2 mg/dL   GFR calc non Af Amer >60 >60 mL/min   GFR calc Af Amer >60 >60 mL/min   Anion gap 9 5 - 15  Urinalysis, Routine w reflex microscopic   Result Value Ref Range   Color, Urine YELLOW YELLOW   APPearance CLEAR CLEAR   Specific Gravity, Urine 1.015 1.005 - 1.030   pH 5.0 5.0 - 8.0   Glucose, UA NEGATIVE NEGATIVE mg/dL   Hgb urine dipstick NEGATIVE NEGATIVE   Bilirubin Urine NEGATIVE NEGATIVE   Ketones, ur NEGATIVE NEGATIVE mg/dL   Protein, ur NEGATIVE NEGATIVE mg/dL   Nitrite NEGATIVE NEGATIVE   Leukocytes, UA NEGATIVE NEGATIVE  I-stat chem 8, ed  Result Value Ref Range   Sodium 136 135 - 145 mmol/L   Potassium 4.7 3.5 - 5.1 mmol/L   Chloride 104 98 - 111 mmol/L   BUN 25 (H) 8 - 23 mg/dL   Creatinine, Ser 4.69 (H) 0.61 - 1.24 mg/dL   Glucose, Bld 629 (H) 70 - 99 mg/dL   Calcium, Ion 5.28 (L) 1.15 - 1.40 mmol/L   TCO2 25 22 - 32 mmol/L  Hemoglobin 12.9 (L) 13.0 - 17.0 g/dL   HCT 16.1 (L) 09.6 - 04.5 %  I-Stat CG4 Lactic Acid, ED  Result Value Ref Range   Lactic Acid, Venous 2.24 (HH) 0.5 - 1.9 mmol/L   Comment NOTIFIED PHYSICIAN    Dg Chest 2 View  Result Date: 01/22/2018 CLINICAL DATA:  Preop respiratory exam EXAM: CHEST - 2 VIEW COMPARISON:  None. FINDINGS: Heart and mediastinal contours are within normal limits. No focal opacities or effusions. No acute bony abnormality. IMPRESSION: No active cardiopulmonary disease. Electronically Signed   By: Charlett Nose M.D.   On: 01/22/2018 08:25   Dg Chest Port 1 View  Result Date: 02/07/2018 CLINICAL DATA:  reports of a fever that started 1500 today. Pt. Reports temp. Was 100.1. Pt. Took 1000 milligrams of Tylenol at 1530. Pt. Had recent left knee replacement. Bandage is dry and clean. Left knee feels hot to touch. EXAM: PORTABLE CHEST 1 VIEW COMPARISON:  01/21/2018 FINDINGS: Heart size is accentuated by the portable AP technique. The lungs are free of focal consolidations and pleural effusions. No pulmonary edema. IMPRESSION: No active disease. Electronically Signed   By: Norva Pavlov M.D.   On: 02/07/2018 20:33   Dg Knee Left Port  Result Date:  01/29/2018 CLINICAL DATA:  Status post left knee replacement EXAM: PORTABLE LEFT KNEE - 1-2 VIEW COMPARISON:  None. FINDINGS: Left knee prosthesis is seen. No acute bony or soft tissue abnormality is noted. IMPRESSION: Status post left knee replacement Electronically Signed   By: Alcide Clever M.D.   On: 01/29/2018 15:49   UA without signs of infection.  Discussed abx-- patient reports he is already on bactrim.  Will have him continue this.  Patient family requesting refill of his oxycontin for a few days-- states he only took this sparingly but has run out.  He has not been taking the immediate release oxycodone tablets as he does not like how they make him feel.  I trust he will use these responsibly so feel this is reasonable.  He will follow-up with his orthopedist.  Will return here for any new/acute changes.   Garlon Hatchet, PA-C 02/07/18 2320    Virgina Norfolk, DO 02/07/18 2330

## 2018-02-09 LAB — URINE CULTURE: Culture: NO GROWTH

## 2018-02-09 NOTE — Addendum Note (Signed)
Addendum  created 02/09/18 0940 by Lowella Curb, MD   Intraprocedure Blocks edited, Sign clinical note

## 2018-02-12 LAB — CULTURE, BLOOD (ROUTINE X 2)
Culture: NO GROWTH
Culture: NO GROWTH

## 2018-02-16 ENCOUNTER — Encounter (INDEPENDENT_AMBULATORY_CARE_PROVIDER_SITE_OTHER): Payer: Self-pay | Admitting: Orthopaedic Surgery

## 2018-02-16 ENCOUNTER — Ambulatory Visit (INDEPENDENT_AMBULATORY_CARE_PROVIDER_SITE_OTHER): Payer: Medicare Other | Admitting: Physician Assistant

## 2018-02-16 DIAGNOSIS — Z6841 Body Mass Index (BMI) 40.0 and over, adult: Secondary | ICD-10-CM

## 2018-02-16 DIAGNOSIS — Z96652 Presence of left artificial knee joint: Secondary | ICD-10-CM

## 2018-02-16 NOTE — Progress Notes (Signed)
Post-Op Visit Note   Patient: [name]           Date of Birth: December 29, 1955           MRN: [id number] Visit Date: 02/16/2018 PCP: Merlene Laughter, MD   Assessment & Plan:  Chief Complaint:  Chief Complaint  Patient presents with  . Left Knee - Routine Post Op   Visit Diagnoses:  1. S/P TKR (total knee replacement), left   2. Morbid obesity (HCC)   3. Body mass index 40.0-44.9, adult [facility])     Plan: Patient is a pleasant 62 year old gentleman who presents to our clinic today 18 days status post left total knee replacement, date of surgery 01/29/2018.  He has been doing excellent.  He does note fevers and chills of up to 100.2 which occurred last week.  His daughter was worried and took him to the ED.  He was worked up with out any relation to the left knee.  He has been getting home health physical therapy where he has made great progress.  He notes minimal pain to the left knee which is relieved with Tylenol.  Overall doing well.  Examination left knee reveals a well-healing surgical incision with nylon sutures in place.  No evidence of cellulitis or infection.  Calf is soft and nontender.  Range of motion 0 to 95 degrees.  He is neurovascularly intact distally.  At this point, we will remove the stitches and apply Steri-Strips.  We will also transition him to outpatient physical therapy.  A prescription for this was given to the patient today.  He will follow-up with [place] in 4 weeks time for repeat evaluation and x-ray.  Call with concerns or questions in the meantime.   The patient meets the AMA guidelines for Morbid (severe) obesity with a BMI > 40.0 and I have recommended weight loss.   Follow-Up Instructions: Return in about 4 weeks (around 03/16/2018).   Orders:  No orders of the defined types were placed in this encounter.  No orders of the defined types were placed in this encounter.   Imaging: No new imaging  PMFS History: Patient Active Problem List   Diagnosis  Date Noted  . Body mass index 40.0-44.9, adult (HCC) 02/16/2018  . Primary osteoarthritis of left knee 01/29/2018  . S/P TKR (total knee replacement), left 01/29/2018  . Primary gout 01/11/2014  . CAD (coronary artery disease), native coronary artery 07/26/2013  . Essential hypertension 07/26/2013  . Hyperlipidemia 07/26/2013  . Morbid obesity (HCC) 07/26/2013  . Obstructive sleep apnea 07/26/2013   Past Medical History:  Diagnosis Date  . Arthritis    "ankles, knees" (01/29/2018)  . CAD in native artery    cath report from 02/2017 indicates patient had a history of RCA stent >10 years prior to that. At time of that cath he was found to have EF 70%, 20-30% ostial LAD, 30-40% prox LAD, 30-40% Cx, 80-90% Cx beyond OM1, totally occluded RCA with left-to-right collaterals.  . Diabetes mellitus type 2 in obese (HCC)   . GERD (gastroesophageal reflux disease)   . Hyperlipidemia   . Hypertension   . Morbid obesity (HCC)   . OSA on CPAP     Family History  Problem Relation Age of Onset  . Heart attack Mother   . Heart attack Father     Past Surgical History:  Procedure Laterality Date  . CARDIAC CATHETERIZATION  2008  . CORONARY ANGIOPLASTY WITH STENT PLACEMENT  1997  .  JOINT REPLACEMENT    . KNEE ARTHROSCOPY Bilateral    "meniscus repairs"  . TONSILLECTOMY    . TOTAL KNEE ARTHROPLASTY Left 01/29/2018  . TOTAL KNEE ARTHROPLASTY Left 01/29/2018   Procedure: LEFT TOTAL KNEE ARTHROPLASTY;  Surgeon: Tarry Kos, MD;  Location: [facility] OR;  Service: Orthopedics;  Laterality: Left;  . UVULOPALATOPHARYNGOPLASTY  ~ 1994   Social History   Occupational History  . Not on file  Tobacco Use  . Smoking status: Never Smoker  . Smokeless tobacco: Never Used  Substance and Sexual Activity  . Alcohol use: Yes    Comment: 01/29/2018 "may have 1 beer/month; if that"  . Drug use: Never  . Sexual activity: Not Currently

## 2018-02-26 ENCOUNTER — Other Ambulatory Visit: Payer: Self-pay | Admitting: Interventional Cardiology

## 2018-03-23 ENCOUNTER — Ambulatory Visit (INDEPENDENT_AMBULATORY_CARE_PROVIDER_SITE_OTHER): Payer: Medicare Other | Admitting: Orthopaedic Surgery

## 2018-03-24 ENCOUNTER — Ambulatory Visit (INDEPENDENT_AMBULATORY_CARE_PROVIDER_SITE_OTHER): Payer: Medicare Other

## 2018-03-24 ENCOUNTER — Ambulatory Visit (INDEPENDENT_AMBULATORY_CARE_PROVIDER_SITE_OTHER): Payer: Medicare Other | Admitting: Orthopaedic Surgery

## 2018-03-24 ENCOUNTER — Encounter (INDEPENDENT_AMBULATORY_CARE_PROVIDER_SITE_OTHER): Payer: Self-pay | Admitting: Orthopaedic Surgery

## 2018-03-24 DIAGNOSIS — Z96652 Presence of left artificial knee joint: Secondary | ICD-10-CM

## 2018-03-24 NOTE — Progress Notes (Signed)
Office Visit Note   Patient: [name]           Date of Birth: [birth year]           MRN: [id number] Visit Date: 03/24/2018              Requested by: Merlene Laughter, MD [address] [place], [place] [address] PCP: Merlene Laughter, MD   Assessment & Plan: Visit Diagnoses:  1. S/P TKR (total knee replacement), left     Plan: He is doing well for his 6-week visit.  He is doing really well with ambulation and range of motion.  We will see him back in 6 weeks for recheck.  He will call with any questions or concerns.  Overall he is very happy.  Follow-Up Instructions: Return in about 6 weeks (around 05/05/2018).   Orders:  Orders Placed This Encounter  Procedures  . XR KNEE 3 VIEW LEFT   No orders of the defined types were placed in this encounter.     Procedures: No procedures performed   Clinical Data: No additional findings.   Subjective: Chief Complaint  Patient presents with  . Left Knee - Pain, Follow-up    [name] is 54 days status post left total knee replacement.  He is doing well and progressing with physical therapy.  He is taking Tylenol occasionally.  He is happy with his progress.   Review of Systems   Objective: Vital Signs: There were no vitals taken for this visit.  Physical Exam  Ortho Exam Left knee exam shows a fully healed surgical scar.  He has excellent range of motion from 0 to 115 degrees. Specialty Comments:  No specialty comments available.  Imaging: Xr Knee 3 View Left  Result Date: 03/24/2018 Stable left total knee replacement in good alignment.    PMFS History: Patient Active Problem List   Diagnosis Date Noted  . Body mass index 40.0-44.9, adult (HCC) 02/16/2018  . Primary osteoarthritis of left knee 01/29/2018  . S/P TKR (total knee replacement), left 01/29/2018  . Primary gout 01/11/2014  . CAD (coronary artery disease), native coronary artery 07/26/2013  . Essential hypertension 07/26/2013  .  Hyperlipidemia 07/26/2013  . Morbid obesity (HCC) 07/26/2013  . Obstructive sleep apnea 07/26/2013   Past Medical History:  Diagnosis Date  . Arthritis    "ankles, knees" (01/29/2018)  . CAD in native artery    cath report from 02/2017 indicates patient had a history of RCA stent >10 years prior to that. At time of that cath he was found to have EF 70%, 20-30% ostial LAD, 30-40% prox LAD, 30-40% Cx, 80-90% Cx beyond OM1, totally occluded RCA with left-to-right collaterals.  . Diabetes mellitus type 2 in obese (HCC)   . GERD (gastroesophageal reflux disease)   . Hyperlipidemia   . Hypertension   . Morbid obesity (HCC)   . OSA on CPAP     Family History  Problem Relation Age of Onset  . Heart attack Mother   . Heart attack Father     Past Surgical History:  Procedure Laterality Date  . CARDIAC CATHETERIZATION  2008  . CORONARY ANGIOPLASTY WITH STENT PLACEMENT  1997  . JOINT REPLACEMENT    . KNEE ARTHROSCOPY Bilateral    "meniscus repairs"  . TONSILLECTOMY    . TOTAL KNEE ARTHROPLASTY Left 01/29/2018  . TOTAL KNEE ARTHROPLASTY Left 01/29/2018   Procedure: LEFT TOTAL KNEE ARTHROPLASTY;  Surgeon: Tarry Kos, MD;  Location: [facility]  OR;  Service: Orthopedics;  Laterality: Left;  . UVULOPALATOPHARYNGOPLASTY  ~ 1994   Social History   Occupational History  . Not on file  Tobacco Use  . Smoking status: Never Smoker  . Smokeless tobacco: Never Used  Substance and Sexual Activity  . Alcohol use: Yes    Comment: 01/29/2018 "may have 1 beer/month; if that"  . Drug use: Never  . Sexual activity: Not Currently

## 2018-04-02 ENCOUNTER — Ambulatory Visit: Payer: Medicare Other | Admitting: Podiatry

## 2018-04-05 ENCOUNTER — Encounter: Payer: Self-pay | Admitting: Podiatry

## 2018-04-05 ENCOUNTER — Ambulatory Visit: Payer: Medicare Other | Admitting: Podiatry

## 2018-04-05 DIAGNOSIS — E1140 Type 2 diabetes mellitus with diabetic neuropathy, unspecified: Secondary | ICD-10-CM

## 2018-04-05 DIAGNOSIS — M79676 Pain in unspecified toe(s): Secondary | ICD-10-CM

## 2018-04-05 DIAGNOSIS — B351 Tinea unguium: Secondary | ICD-10-CM | POA: Diagnosis not present

## 2018-04-13 NOTE — Progress Notes (Signed)
Subjective: 62 y.o. returns the office today for painful, elongated, thickened toenails which he cannot trim himself. Denies any redness or drainage around the nails. Denies any recent injury.  Denies any acute changes since last appointment and no new complaints today. Denies any systemic complaints such as fevers, chills, nausea, vomiting.   Since I last saw him he underwent a knee replacement he still undergoing physical therapy.  PCP: Merlene LaughterStoneking, Hal, MD  Objective: AAO 3, NAD; presents wearing regular shoes.  DP/PT pulses palpable, CRT less than 3 seconds Protective sensation decreased with Simms Weinstein monofilament Nails hypertrophic, dystrophic, elongated, brittle, discolored 10. There is tenderness overlying the nails 1-5 bilaterally. There is no surrounding erythema or drainage along the nail sites. No open lesions or pre-ulcerative lesions are identified. Flatfoot right > left with prominence of the talar head.  No area pinpoint bony tenderness or pain to vibratory sensation. No pain with calf compression, swelling, warmth, erythema.  Assessment: Patient presents with symptomatic onychomycosis; neuropathy  Plan: -Treatment options including alternatives, risks, complications were discussed -Nails sharply debrided 10 without complication/bleeding. -Discussed daily foot inspection. If there are any changes, to call the office immediately.  -Follow-up in 3 months or sooner if any problems are to arise. In the meantime, encouraged to call the office with any questions, concerns, changes symptoms.  Ovid CurdMatthew Emileo Semel, DPM

## 2018-05-05 ENCOUNTER — Encounter (INDEPENDENT_AMBULATORY_CARE_PROVIDER_SITE_OTHER): Payer: Self-pay | Admitting: Orthopaedic Surgery

## 2018-05-05 ENCOUNTER — Ambulatory Visit (INDEPENDENT_AMBULATORY_CARE_PROVIDER_SITE_OTHER): Payer: Medicare Other | Admitting: Physician Assistant

## 2018-05-05 DIAGNOSIS — Z96652 Presence of left artificial knee joint: Secondary | ICD-10-CM

## 2018-05-05 NOTE — Progress Notes (Signed)
Post-Op Visit Note   Patient: [name]           Date of Birth: 1955/10/24           MRN: [id number] Visit Date: 05/05/2018 PCP: Merlene Laughter, MD   Assessment & Plan:  Chief Complaint:  Chief Complaint  Patient presents with  . Left Knee - Pain, Routine Post Op   Visit Diagnoses:  1. S/P TKR (total knee replacement), left   2. Morbid obesity (HCC)     Plan: Patient is a pleasant 63 year old gentleman who presents to our clinic today 96 days status post left total knee replacement, date of surgery 01/29/2018.  He has been doing fairly well.  He is finished formal physical therapy.  He has occasional pain which is relieved with Tylenol.  No fevers or chills.  Overall feeling great.  Examination of the left knee reveals well-healed surgical incision without evidence of infection or cellulitis.  Calf is soft nontender.  Range of motion 0 to 120 degrees.  He is neurovascularly intact distally.  At this point, he will continue to work on strengthening exercises.  Dental prophylaxis reinforced.  Follow-up with [place] in 3 months time for repeat evaluation and x-rays.  Follow-Up Instructions: Return in about 3 months (around 08/04/2018).   Orders:  No orders of the defined types were placed in this encounter.  No orders of the defined types were placed in this encounter.   Imaging: No new imaging  PMFS History: Patient Active Problem List   Diagnosis Date Noted  . Body mass index 40.0-44.9, adult (HCC) 02/16/2018  . Primary osteoarthritis of left knee 01/29/2018  . S/P TKR (total knee replacement), left 01/29/2018  . Primary gout 01/11/2014  . CAD (coronary artery disease), native coronary artery 07/26/2013  . Essential hypertension 07/26/2013  . Hyperlipidemia 07/26/2013  . Morbid obesity (HCC) 07/26/2013  . Obstructive sleep apnea 07/26/2013   Past Medical History:  Diagnosis Date  . Arthritis    "ankles, knees" (01/29/2018)  . CAD in native artery    cath report  from 02/2017 indicates patient had a history of RCA stent >10 years prior to that. At time of that cath he was found to have EF 70%, 20-30% ostial LAD, 30-40% prox LAD, 30-40% Cx, 80-90% Cx beyond OM1, totally occluded RCA with left-to-right collaterals.  . Diabetes mellitus type 2 in obese (HCC)   . GERD (gastroesophageal reflux disease)   . Hyperlipidemia   . Hypertension   . Morbid obesity (HCC)   . OSA on CPAP     Family History  Problem Relation Age of Onset  . Heart attack Mother   . Heart attack Father     Past Surgical History:  Procedure Laterality Date  . CARDIAC CATHETERIZATION  2008  . CORONARY ANGIOPLASTY WITH STENT PLACEMENT  1997  . JOINT REPLACEMENT    . KNEE ARTHROSCOPY Bilateral    "meniscus repairs"  . TONSILLECTOMY    . TOTAL KNEE ARTHROPLASTY Left 01/29/2018  . TOTAL KNEE ARTHROPLASTY Left 01/29/2018   Procedure: LEFT TOTAL KNEE ARTHROPLASTY;  Surgeon: Tarry Kos, MD;  Location: [facility] OR;  Service: Orthopedics;  Laterality: Left;  . UVULOPALATOPHARYNGOPLASTY  ~ 1994   Social History   Occupational History  . Not on file  Tobacco Use  . Smoking status: Never Smoker  . Smokeless tobacco: Never Used  Substance and Sexual Activity  . Alcohol use: Yes    Comment: 01/29/2018 "may have 1 beer/month; if that"  .  Drug use: Never  . Sexual activity: Not Currently

## 2018-05-19 ENCOUNTER — Telehealth: Payer: Self-pay | Admitting: *Deleted

## 2018-05-19 MED ORDER — L-METHYLFOLATE-B6-B12 3-35-2 MG PO TABS: ORAL_TABLET | ORAL | 3 refills | Status: DC

## 2018-05-19 NOTE — Telephone Encounter (Signed)
Pt presented to office and request refill of the Metanx. Pt is consistent with diabetic exams and Dr. Ardelle Anton allowed for the refill.

## 2018-06-14 ENCOUNTER — Telehealth: Payer: Self-pay | Admitting: *Deleted

## 2018-06-14 MED ORDER — PREGABALIN 150 MG PO CAPS: 150.0000 mg | ORAL_CAPSULE | Freq: Two times a day (BID) | ORAL | 2 refills | Status: DC

## 2018-06-14 NOTE — Telephone Encounter (Signed)
Pt request refill of the lyrica. Dr. Bary Castilla the refill for the Lyrica as previously.

## 2018-06-14 NOTE — Telephone Encounter (Signed)
Lyrica called to [facility].

## 2018-07-05 ENCOUNTER — Ambulatory Visit: Payer: Medicare Other | Admitting: Podiatry

## 2018-07-19 ENCOUNTER — Other Ambulatory Visit: Payer: Self-pay | Admitting: Podiatry

## 2018-08-04 ENCOUNTER — Ambulatory Visit (INDEPENDENT_AMBULATORY_CARE_PROVIDER_SITE_OTHER): Payer: Medicare Other | Admitting: Orthopaedic Surgery

## 2018-08-05 ENCOUNTER — Ambulatory Visit (INDEPENDENT_AMBULATORY_CARE_PROVIDER_SITE_OTHER): Payer: Medicare Other | Admitting: Orthopaedic Surgery

## 2018-08-05 ENCOUNTER — Encounter (INDEPENDENT_AMBULATORY_CARE_PROVIDER_SITE_OTHER): Payer: Self-pay | Admitting: Orthopaedic Surgery

## 2018-08-05 ENCOUNTER — Other Ambulatory Visit: Payer: Self-pay

## 2018-08-05 ENCOUNTER — Ambulatory Visit (INDEPENDENT_AMBULATORY_CARE_PROVIDER_SITE_OTHER): Payer: Medicare Other

## 2018-08-05 DIAGNOSIS — Z96652 Presence of left artificial knee joint: Secondary | ICD-10-CM | POA: Diagnosis not present

## 2018-08-05 NOTE — Progress Notes (Signed)
Post-Op Visit Note   Patient: [name]           Date of Birth: 1955-05-05           MRN: [id number] Visit Date: 08/05/2018 PCP: Merlene Laughter, MD   Assessment & Plan:  Chief Complaint:  Chief Complaint  Patient presents with  . Left Knee - Follow-up   Visit Diagnoses:  1. S/P TKR (total knee replacement), left     Plan: Patient is a pleasant 63 year old gentleman who presents our clinic today 6 months status post left total knee replacement, date of surgery 01/29/2018.  He has been doing very well overall.  He notes an occasional pain with walking as well as occasional swelling to the lateral knee.  Examination of his left knee reveals a fully healed surgical scar without evidence of infection.  Trace effusion.  Range of motion 0 to 115 degrees.  Stable valgus varus stress.  Calf soft nontender.  He is neurovascularly intact distally.  At this point, he will continue working on strengthening exercises.  I reinforced that he could get some persistent pain and swelling with increased activity for a year or so after surgery.  Dental prophylaxis reinforced.  He will follow-up with [place] in 6 months time for repeat evaluation and x-rays.  Call with concerns or questions in the meantime.  Follow-Up Instructions: Return in about 6 months (around 02/04/2019).   Orders:  Orders Placed This Encounter  Procedures  . XR Knee 1-2 Views Left   No orders of the defined types were placed in this encounter.   Imaging: Xr Knee 1-2 Views Left  Result Date: 08/05/2018 X-rays demonstrate stable alignment of the prosthesis without complication   PMFS History: Patient Active Problem List   Diagnosis Date Noted  . Body mass index 40.0-44.9, adult (HCC) 02/16/2018  . Primary osteoarthritis of left knee 01/29/2018  . S/P TKR (total knee replacement), left 01/29/2018  . Primary gout 01/11/2014  . CAD (coronary artery disease), native coronary artery 07/26/2013  . Essential hypertension  07/26/2013  . Hyperlipidemia 07/26/2013  . Morbid obesity (HCC) 07/26/2013  . Obstructive sleep apnea 07/26/2013   Past Medical History:  Diagnosis Date  . Arthritis    "ankles, knees" (01/29/2018)  . CAD in native artery    cath report from 02/2017 indicates patient had a history of RCA stent >10 years prior to that. At time of that cath he was found to have EF 70%, 20-30% ostial LAD, 30-40% prox LAD, 30-40% Cx, 80-90% Cx beyond OM1, totally occluded RCA with left-to-right collaterals.  . Diabetes mellitus type 2 in obese (HCC)   . GERD (gastroesophageal reflux disease)   . Hyperlipidemia   . Hypertension   . Morbid obesity (HCC)   . OSA on CPAP     Family History  Problem Relation Age of Onset  . Heart attack Mother   . Heart attack Father     Past Surgical History:  Procedure Laterality Date  . CARDIAC CATHETERIZATION  2008  . CORONARY ANGIOPLASTY WITH STENT PLACEMENT  1997  . JOINT REPLACEMENT    . KNEE ARTHROSCOPY Bilateral    "meniscus repairs"  . TONSILLECTOMY    . TOTAL KNEE ARTHROPLASTY Left 01/29/2018  . TOTAL KNEE ARTHROPLASTY Left 01/29/2018   Procedure: LEFT TOTAL KNEE ARTHROPLASTY;  Surgeon: Tarry Kos, MD;  Location: [facility] OR;  Service: Orthopedics;  Laterality: Left;  . UVULOPALATOPHARYNGOPLASTY  ~ 1994   Social History   Occupational History  .  Not on file  Tobacco Use  . Smoking status: Never Smoker  . Smokeless tobacco: Never Used  Substance and Sexual Activity  . Alcohol use: Yes    Comment: 01/29/2018 "may have 1 beer/month; if that"  . Drug use: Never  . Sexual activity: Not Currently

## 2018-08-16 ENCOUNTER — Ambulatory Visit: Payer: Medicare Other | Admitting: Podiatry

## 2018-08-20 ENCOUNTER — Ambulatory Visit: Payer: Medicare Other | Admitting: Podiatry

## 2018-08-20 ENCOUNTER — Other Ambulatory Visit: Payer: Self-pay

## 2018-08-20 DIAGNOSIS — E1140 Type 2 diabetes mellitus with diabetic neuropathy, unspecified: Secondary | ICD-10-CM

## 2018-08-20 DIAGNOSIS — M79676 Pain in unspecified toe(s): Secondary | ICD-10-CM | POA: Diagnosis not present

## 2018-08-20 DIAGNOSIS — B351 Tinea unguium: Secondary | ICD-10-CM

## 2018-08-22 NOTE — Progress Notes (Signed)
Subjective: 63 y.o. returns the office today for painful, elongated, thickened toenails which he cannot trim himself. Denies any redness or drainage around the nails.  Overall he states he has been doing well.  Denies any acute changes since last appointment and no new complaints today. Denies any systemic complaints such as fevers, chills, nausea, vomiting.   PCP: Merlene Laughter, MD  Objective: AAO 3, NAD; presents wearing regular shoes.  DP/PT pulses palpable, CRT less than 3 seconds Protective sensation decreased with Simms Weinstein monofilament Nails hypertrophic, dystrophic, elongated, brittle, discolored 10. There is tenderness overlying the nails 1-5 bilaterally. There is no surrounding erythema or drainage along the nail sites. No open lesions or pre-ulcerative lesions are identified. Flatfoot right > left with prominence of the talar head.  No area pinpoint bony tenderness or pain to vibratory sensation. No pain with calf compression, swelling, warmth, erythema.  Assessment: Patient presents with symptomatic onychomycosis; neuropathy  Plan: -Treatment options including alternatives, risks, complications were discussed -Nails sharply debrided 10 without complication/bleeding. -Discussed daily foot inspection. If there are any changes, to call the office immediately.  -Follow-up in 3 months or sooner if any problems are to arise. In the meantime, encouraged to call the office with any questions, concerns, changes symptoms.  Ovid Curd, DPM

## 2018-08-24 ENCOUNTER — Telehealth: Payer: Self-pay | Admitting: Physician Assistant

## 2018-08-24 NOTE — Telephone Encounter (Signed)
Spoke with patient who confirmed all demographics. Patient has a smart phone and uses My Chart. Will have vitals ready for visit. °

## 2018-08-25 NOTE — Progress Notes (Signed)
Virtual Visit via Video Note   This visit type was conducted due to national recommendations for restrictions regarding the COVID-19 Pandemic (e.g. social distancing) in an effort to limit this patient's exposure and mitigate transmission in our community.  Due to his co-morbid illnesses, this patient is at least at moderate risk for complications without adequate follow up.  This format is felt to be most appropriate for this patient at this time.  All issues noted in this document were discussed and addressed.  A limited physical exam was performed with this format.  Please refer to the patient's chart for his consent to telehealth for [facility].   Date:  08/26/2018   ID:  [name], DOB [birth year], MRN [id number]  Patient Location: Home Provider Location: Home  PCP:  Merlene Laughter, MD  Cardiologist:  Lesleigh Noe, MD  Evaluation Performed:  Follow-Up Visit  Chief Complaint:  6 months follow up  History of Present Illness:    [name] is a 63 y.o. male with history of CAD, hypertension, sleep apnea (uses CPAP), morbid obesity, DM, and hyperlipidemia who seen for follow up.   Records are limited in the chart but last cath report from 02/2007 indicates patient had a history of RCA stent >10 years prior to that. At time of that cath he was found to have EF 70%, 20-30% ostial LAD, 30-40% prox LAD, 30-40% Cx, 80-90% Cx beyond OM1, totally occluded RCA with left-to-right collaterals. This was treated medically.  Patient has chronic stable angina dating back to 2008. Last seen by Ronie Spies, APP for preop clearance. Follow up stress test was low risk with non reversible defect. Per Dr. Katrinka Blazing, this scan is compatible with his known prior inferior MI. Cleared for surgery.   Tolerated his surgery well.  Patient continues to have exertional angina which is stable for months.  He knows his limitation.  Never required to take sublingual nitroglycerin.  He denies exertional  shortness of breath or fatigue.  Compliant with his medication and CPAP.  Takes care of wife who has a severe MS.  He denies palpitation, dizziness, shortness of breath, orthopnea, PND, syncope, lower extremity edema or melena.    The patient does not have symptoms concerning for COVID-19 infection (fever, chills, cough, or new shortness of breath).    Past Medical History:  Diagnosis Date   Arthritis    "ankles, knees" (01/29/2018)   CAD in native artery    cath report from 02/2017 indicates patient had a history of RCA stent >10 years prior to that. At time of that cath he was found to have EF 70%, 20-30% ostial LAD, 30-40% prox LAD, 30-40% Cx, 80-90% Cx beyond OM1, totally occluded RCA with left-to-right collaterals.   Diabetes mellitus type 2 in obese (HCC)    GERD (gastroesophageal reflux disease)    Hyperlipidemia    Hypertension    Morbid obesity (HCC)    OSA on CPAP    Past Surgical History:  Procedure Laterality Date   CARDIAC CATHETERIZATION  2008   CORONARY ANGIOPLASTY WITH STENT PLACEMENT  1997   JOINT REPLACEMENT     KNEE ARTHROSCOPY Bilateral    "meniscus repairs"   TONSILLECTOMY     TOTAL KNEE ARTHROPLASTY Left 01/29/2018   TOTAL KNEE ARTHROPLASTY Left 01/29/2018   Procedure: LEFT TOTAL KNEE ARTHROPLASTY;  Surgeon: Tarry Kos, MD;  Location: [facility] OR;  Service: Orthopedics;  Laterality: Left;   UVULOPALATOPHARYNGOPLASTY  ~ 1994  Current Meds  Medication Sig   acetaminophen (TYLENOL) 650 MG CR tablet Take 1,300 mg by mouth at bedtime.   aspirin EC 325 MG tablet Take 1 tablet (325 mg total) by mouth daily.   aspirin EC 325 MG tablet Take 325 mg by mouth daily.   atorvastatin (LIPITOR) 80 MG tablet Take 80 mg by mouth at bedtime.    celecoxib (CELEBREX) 200 MG capsule TAKE 1 CAPSULE BY MOUTH EVERY DAY   Cholecalciferol (VITAMIN D3) 2000 units TABS Take 2,000 Units by mouth daily.   Glucosamine-Chondroitin (COSAMIN DS PO) Take 2 tablets  by mouth daily with breakfast.    isosorbide mononitrate (IMDUR) 120 MG 24 hr tablet TAKE 1 TABLET(120 MG) BY MOUTH DAILY   l-methylfolate-B6-B12 (METANX) 3-35-2 MG TABS tablet One tablet by mouth two times daily.   lisinopril (PRINIVIL,ZESTRIL) 5 MG tablet Take 5 mg by mouth at bedtime.    Menthol, Topical Analgesic, (BIOFREEZE) 4 % GEL Apply 1 application topically See admin instructions. Apply to both feet at bedtime for neuropathy   metFORMIN (GLUCOPHAGE-XR) 500 MG 24 hr tablet Take 500 mg by mouth at bedtime.    methocarbamol (ROBAXIN) 750 MG tablet Take 1 tablet (750 mg total) by mouth 2 (two) times daily as needed for muscle spasms.   metoprolol (LOPRESSOR) 50 MG tablet TAKE ONE TABLET BY MOUTH TWICE DAILY (Patient taking differently: Take 50 mg by mouth 2 (two) times daily. )   Multiple Vitamin (MULTIVITAMIN) tablet Take 1 tablet by mouth daily.   nitroGLYCERIN (NITROSTAT) 0.4 MG SL tablet Place 1 tablet (0.4 mg total) under the tongue every 5 (five) minutes x 3 doses as needed for chest pain.   NON FORMULARY [facility]  Peripheral Neuropathy Cream- Bupivacaine 1%, Doxepin 3%, Gabapentin 6%, Pentoxifylline 3%, Topiramate 1% Apply 1-2 grams to affected area 3-4 times daily Qty. 120 gm 3 refills   omeprazole (PRILOSEC) 20 MG capsule Take 20 mg by mouth daily.    ondansetron (ZOFRAN) 4 MG tablet Take 1-2 tablets (4-8 mg total) by mouth every 8 (eight) hours as needed for nausea or vomiting.   oxyCODONE (OXYCONTIN) 10 mg 12 hr tablet Take 1 tablet (10 mg total) by mouth every 12 (twelve) hours.   pregabalin (LYRICA) 150 MG capsule Take 1 capsule (150 mg total) by mouth 2 (two) times daily.   promethazine (PHENERGAN) 25 MG tablet Take 1 tablet (25 mg total) by mouth every 6 (six) hours as needed for nausea.   senna-docusate (SENOKOT S) 8.6-50 MG tablet Take 1 tablet by mouth at bedtime as needed.   sulfamethoxazole-trimethoprim (BACTRIM DS,SEPTRA DS) 800-160 MG tablet  Take 1 tablet by mouth 2 (two) times daily.   Tetrahydrozoline HCl (VISINE OP) Place 1 drop into both eyes daily as needed (for dry or irritated eyes).    triamterene-hydrochlorothiazide (MAXZIDE) 75-50 MG per tablet Take 0.5 tablets by mouth daily.    WELCHOL 625 MG tablet Take 1,875 mg by mouth at bedtime.      Allergies:   Penicillins   Social History   Tobacco Use   Smoking status: Never Smoker   Smokeless tobacco: Never Used  Substance Use Topics   Alcohol use: Yes    Comment: 01/29/2018 "may have 1 beer/month; if that"   Drug use: Never     Family Hx: The patient's family history includes Heart attack in his father and mother.  ROS:   Please see the history of present illness.    All other systems reviewed and  are negative.   Prior CV studies:   The following studies were reviewed today:  Stress test 12/2017  Nuclear stress EF: 62%.  There was no ST segment deviation noted during stress.  There is a medium defect of moderate severity present in the basal inferior, mid inferior and apical inferior location. The defect is non-reversible and in the setting of normal LVF this is consistent with diaphragmatic attenuation artifact. No ischemia noted.  The left ventricular ejection fraction is normal (55-65%).  This is a low risk study.  Labs/Other Tests and Data Reviewed:    EKG:  No ECG reviewed.  Recent Labs: 02/07/2018: ALT 26; BUN 25; Creatinine, Ser 1.30; Hemoglobin 12.9; Platelets 152; Potassium 4.7; Sodium 136   Recent Lipid Panel Lab Results  Component Value Date/Time   CHOL  03/04/2007 05:30 AM    165        ATP III CLASSIFICATION:  <200     mg/dL   Desirable  161-096  mg/dL   Borderline High  >=045    mg/dL   High   TRIG 409 (H) 81/19/1478 05:30 AM   HDL 30 (L) 03/04/2007 05:30 AM   CHOLHDL 5.5 03/04/2007 05:30 AM   LDLCALC  03/04/2007 05:30 AM    61        Total Cholesterol/HDL:CHD Risk Coronary Heart Disease Risk Table                      Men   Women  1/2 Average Risk   3.4   3.3    Wt Readings from Last 3 Encounters:  08/26/18 284 lb 8 oz (129 kg)  02/07/18 285 lb (129.3 kg)  01/21/18 292 lb 14.4 oz (132.9 kg)     Objective:    Vital Signs:  BP 117/70    Pulse 64    Ht  (1.727 m)    Wt 284 lb 8 oz (129 kg)    BMI 43.26 kg/m    VITAL SIGNS:  reviewed GEN:  no acute distress EYES:  sclerae anicteric, EOMI - Extraocular Movements Intact RESPIRATORY:  normal respiratory effort, symmetric expansion CARDIOVASCULAR:  no peripheral edema SKIN:  no rash, lesions or ulcers. MUSCULOSKELETAL:  no obvious deformities. NEURO:  alert and oriented x 3, no obvious focal deficit PSYCH:  normal affect  ASSESSMENT & PLAN:    1. CAD - Stable angina. He knows his limit. Never required SL nitro. Continue current therapy.   2. OSA on CPAP - Compliant  3. HLD - Followed by PCP  4. HTN - BP stable on current medications  COVID-19 Education: The signs and symptoms of COVID-19 were discussed with the patient and how to seek care for testing (follow up with PCP or arrange E-visit).  The importance of social distancing was discussed today.  Time:   Today, I have spent 8 minutes with the patient with telehealth technology discussing the above problems.     Medication Adjustments/Labs and Tests Ordered: Current medicines are reviewed at length with the patient today.  Concerns regarding medicines are outlined above.   Tests Ordered: No orders of the defined types were placed in this encounter.   Medication Changes: No orders of the defined types were placed in this encounter.   Disposition:  Follow up in 6 month(s)  Signed, Manson Passey, PA  08/26/2018 8:03 AM    [facility]

## 2018-08-26 ENCOUNTER — Other Ambulatory Visit: Payer: Self-pay

## 2018-08-26 ENCOUNTER — Encounter: Payer: Self-pay | Admitting: Physician Assistant

## 2018-08-26 ENCOUNTER — Telehealth (INDEPENDENT_AMBULATORY_CARE_PROVIDER_SITE_OTHER): Payer: Medicare Other | Admitting: Physician Assistant

## 2018-08-26 VITALS — BP 117/70 | HR 64 | Ht 68.0 in | Wt 284.5 lb

## 2018-08-26 DIAGNOSIS — E782 Mixed hyperlipidemia: Secondary | ICD-10-CM

## 2018-08-26 DIAGNOSIS — I2510 Atherosclerotic heart disease of native coronary artery without angina pectoris: Secondary | ICD-10-CM

## 2018-08-26 DIAGNOSIS — G4733 Obstructive sleep apnea (adult) (pediatric): Secondary | ICD-10-CM

## 2018-08-26 DIAGNOSIS — I10 Essential (primary) hypertension: Secondary | ICD-10-CM

## 2018-08-26 NOTE — Progress Notes (Signed)
Medication Instructions:   Your physician recommends that you continue on your current medications as directed. Please refer to the Current Medication list given to you today.   If you need a refill on your cardiac medications before your next appointment, please call your pharmacy.   Lab work:NONE ORDERED  TODAY   If you have labs (blood work) drawn today and your tests are completely normal, you will receive your results only by: Marland Kitchen MyChart Message (if you have MyChart) OR . A paper copy in the mail If you have any lab test that is abnormal or we need to change your treatment, we will call you to review the results.  Testing/Procedures: NONE ORDERED  TODAY    Follow-Up: At [facility], you and your health needs are our priority.  As part of our continuing mission to provide you with exceptional heart care, we have created designated Provider Care Teams.  These Care Teams include your primary Cardiologist (physician) and Advanced Practice Providers (APPs -  Physician Assistants and Nurse Practitioners) who all work together to provide you with the care you need, when you need it. You will need a follow up appointment in 1 years.  Please call our office 2 months in advance to schedule this appointment.  You may see Lesleigh Noe, MD or one of the following Advanced Practice Providers on your designated Care Team:   Norma Fredrickson, NP Nada Boozer, NP . Georgie Chard, NP  Any Other Special Instructions Will Be Listed Below (If Applicable).

## 2018-11-19 ENCOUNTER — Encounter: Payer: Self-pay | Admitting: Podiatry

## 2018-11-19 ENCOUNTER — Other Ambulatory Visit: Payer: Self-pay

## 2018-11-19 ENCOUNTER — Other Ambulatory Visit: Payer: Self-pay | Admitting: Interventional Cardiology

## 2018-11-19 ENCOUNTER — Ambulatory Visit: Payer: Medicare Other | Admitting: Podiatry

## 2018-11-19 DIAGNOSIS — E1140 Type 2 diabetes mellitus with diabetic neuropathy, unspecified: Secondary | ICD-10-CM

## 2018-11-19 DIAGNOSIS — M79676 Pain in unspecified toe(s): Secondary | ICD-10-CM | POA: Diagnosis not present

## 2018-11-19 DIAGNOSIS — B351 Tinea unguium: Secondary | ICD-10-CM | POA: Diagnosis not present

## 2018-11-19 DIAGNOSIS — R21 Rash and other nonspecific skin eruption: Secondary | ICD-10-CM

## 2018-11-19 MED ORDER — TRIAMCINOLONE ACETONIDE 0.025 % EX OINT: 1.0000 "application " | TOPICAL_OINTMENT | Freq: Two times a day (BID) | CUTANEOUS | 0 refills | Status: DC

## 2018-11-19 NOTE — Progress Notes (Signed)
Subjective: 63 y.o. returns the office today for painful, elongated, thickened toenails which he cannot trim himself. Denies any redness or drainage around the nails.  He also has a rash on the left leg he has noticed. He is not sure if he brushed against something in the yard that caused that. Denies any acute changes since last appointment and no new complaints today. Denies any systemic complaints such as fevers, chills, nausea, vomiting.   PCP: Lajean Manes, MD  Objective: AAO 3, NAD; presents wearing regular shoes.  DP/PT pulses palpable, CRT less than 3 seconds Protective sensation decreased with Simms Weinstein monofilament Nails hypertrophic, dystrophic, elongated, brittle, discolored 10. There is tenderness overlying the nails 1-5 bilaterally. There is no surrounding erythema or drainage along the nail sites. No open lesions or pre-ulcerative lesions are identified. Small area of erythema on the left medial ankle, no open lesions or drainage. It is flat. No warmth.  Flatfoot right > left with prominence of the talar head.  No area pinpoint bony tenderness or pain to vibratory sensation. No pain with calf compression, swelling, warmth, erythema.  Assessment: Patient presents with symptomatic onychomycosis; neuropathy; skin rash.   Plan: -Treatment options including alternatives, risks, complications were discussed -Nails sharply debrided 10 without complication/bleeding. -Discussed daily foot inspection. If there are any changes, to call the office immediately.  -Prescribed triamcinolone cream for the rash.  -Follow-up in 3 months or sooner if any problems are to arise. In the meantime, encouraged to call the office with any questions, concerns, changes symptoms.  Celesta Gentile, DPM

## 2018-12-09 IMAGING — CR DG CHEST 2V
2 series · 2 of 2 positions shown · non-contrast
Comparison: None.

CLINICAL DATA: Preop respiratory exam

EXAM:
CHEST - 2 VIEW

[w chest pa]
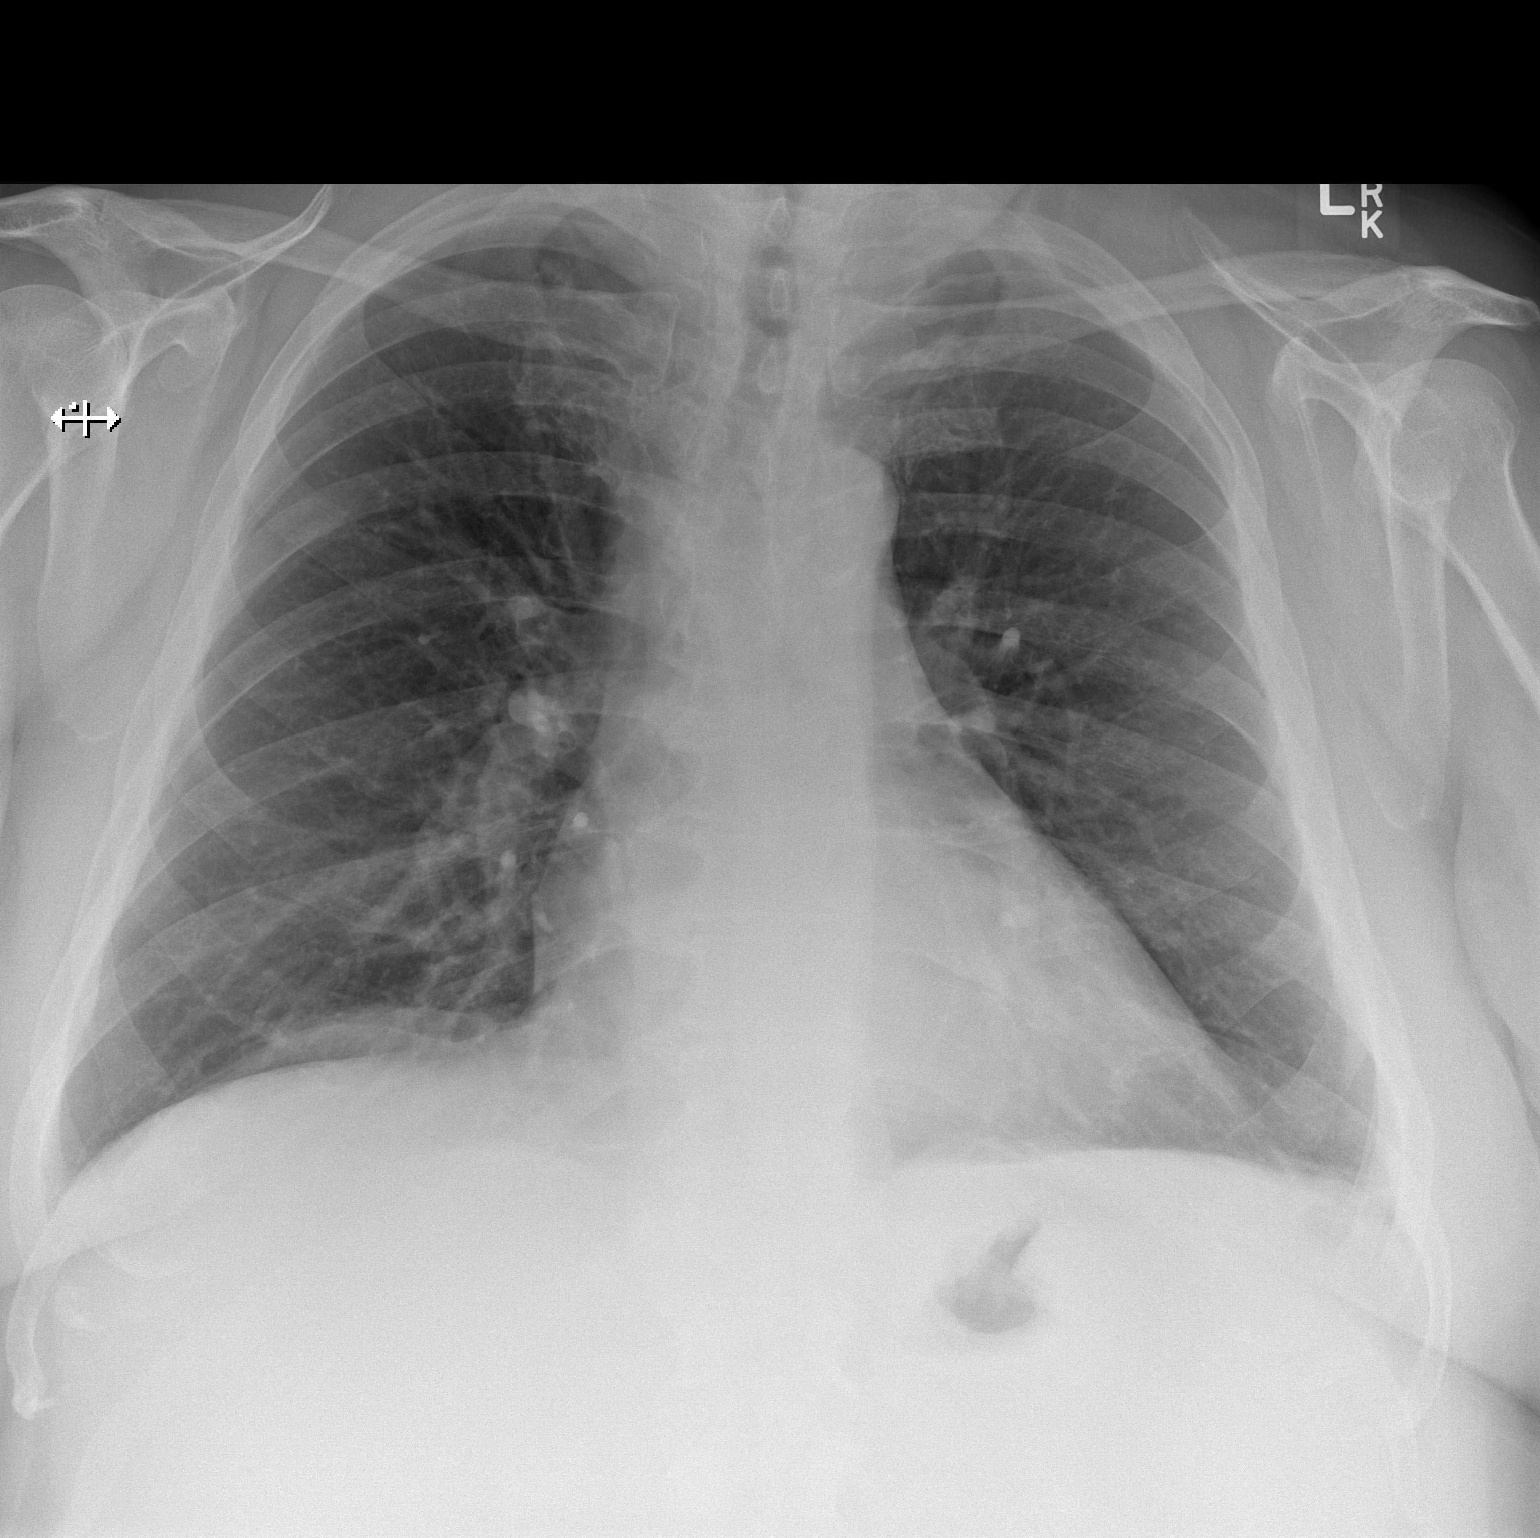

[w chest lat]
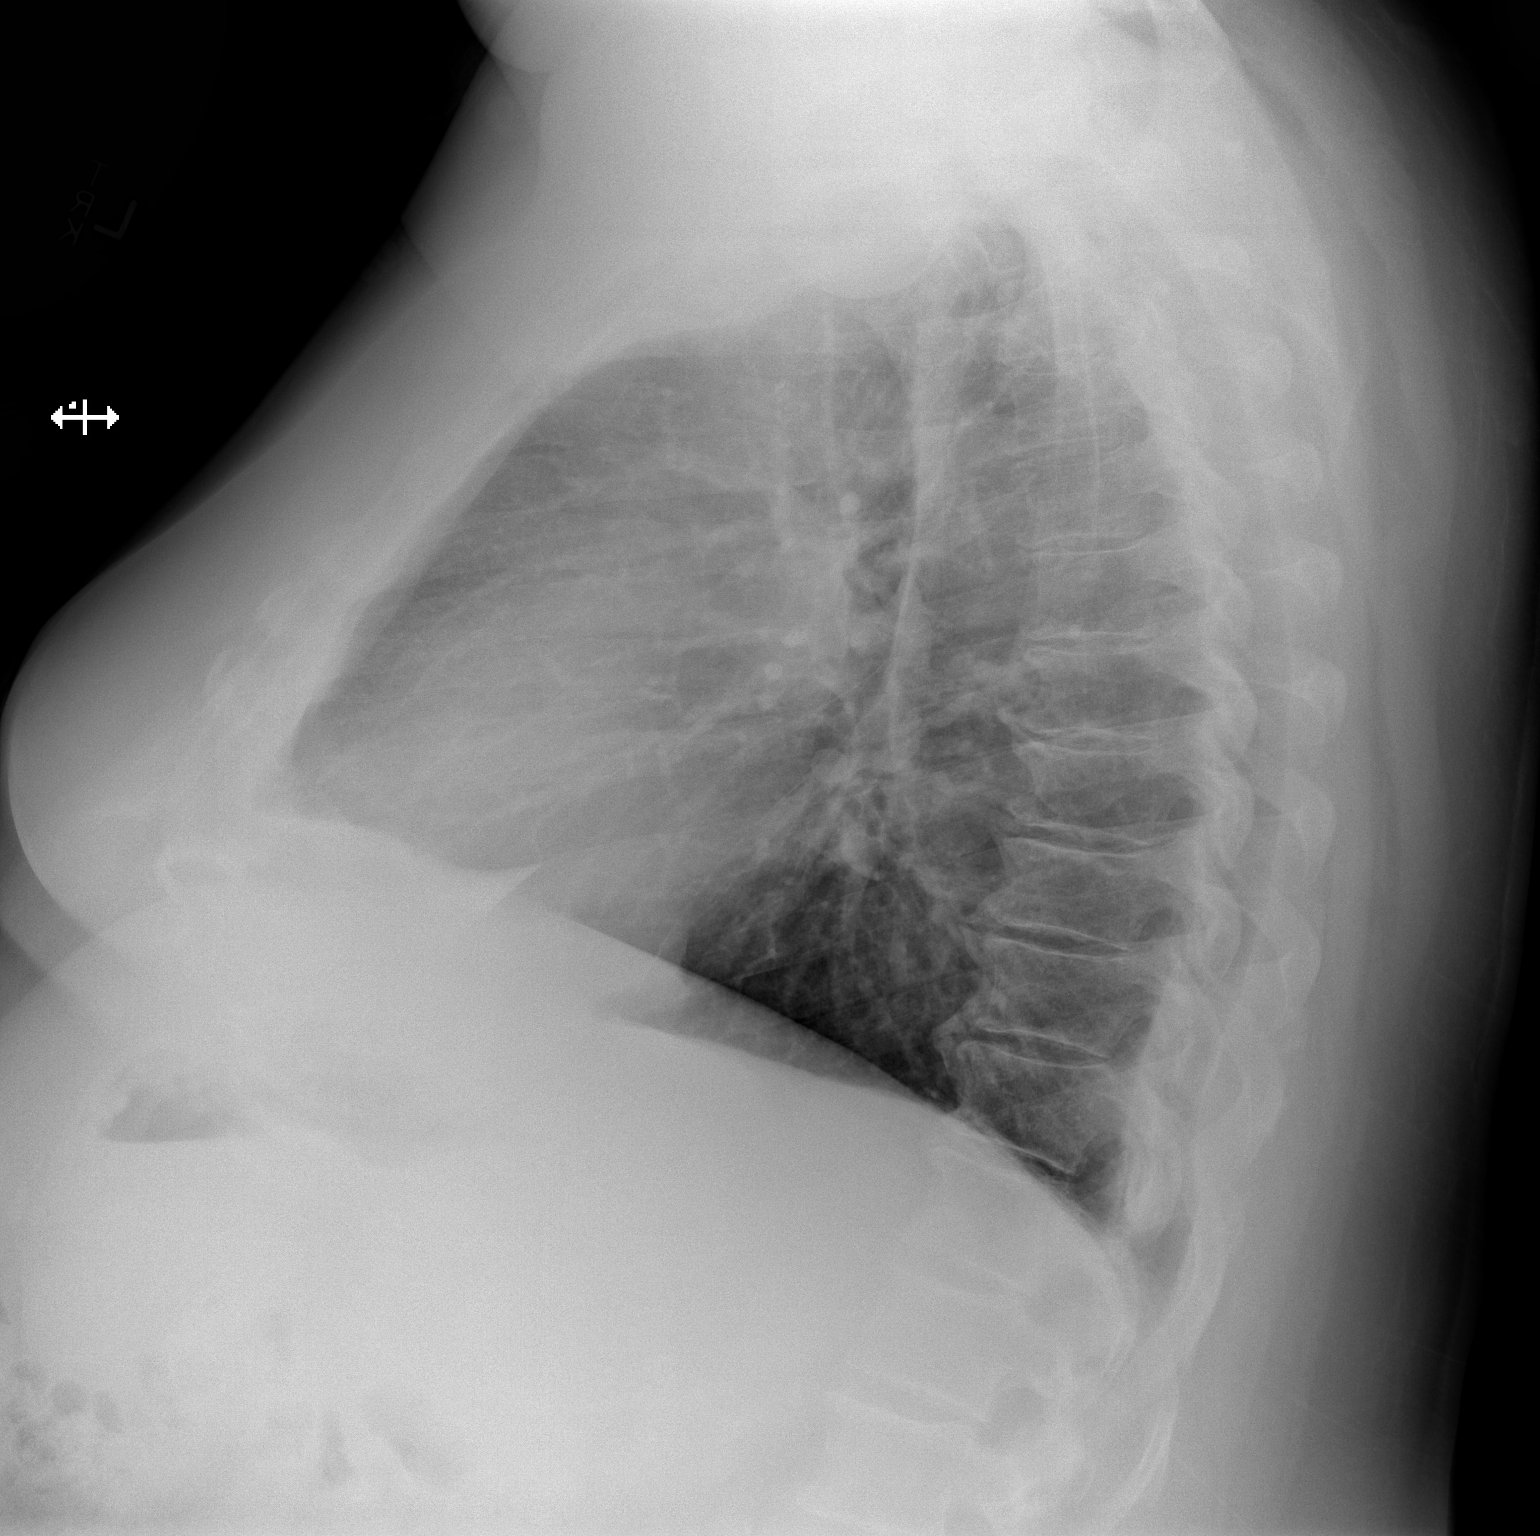

[2 of 2 positions shown; findings below may reference images not displayed]

FINDINGS: Heart and mediastinal contours are within normal limits. No focal
opacities or effusions. No acute bony abnormality.
IMPRESSION: No active cardiopulmonary disease.

## 2018-12-17 IMAGING — DX DG KNEE 1-2V PORT*L*
1 series · 2 of 2 positions shown · non-contrast
Comparison: None.

CLINICAL DATA: Status post left knee replacement

EXAM:
PORTABLE LEFT KNEE - 1-2 VIEW

[Series 1: knee · 0.14mm/px · 2 of 2 slices shown]
[im 1/2]
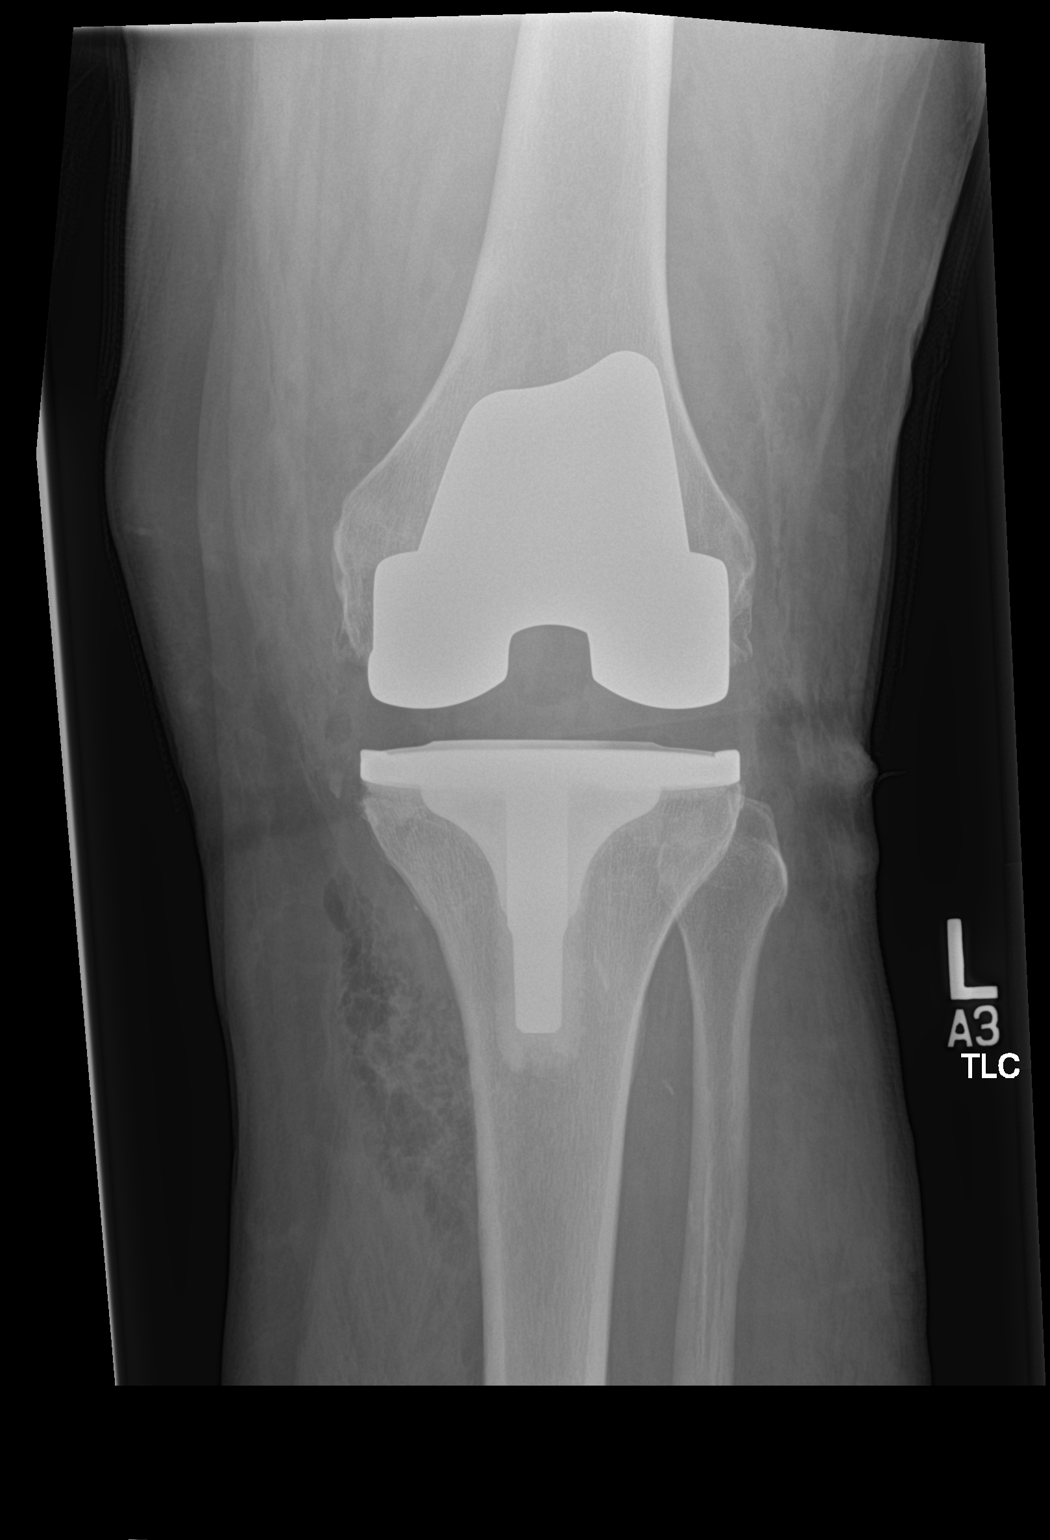
[im 2/2]
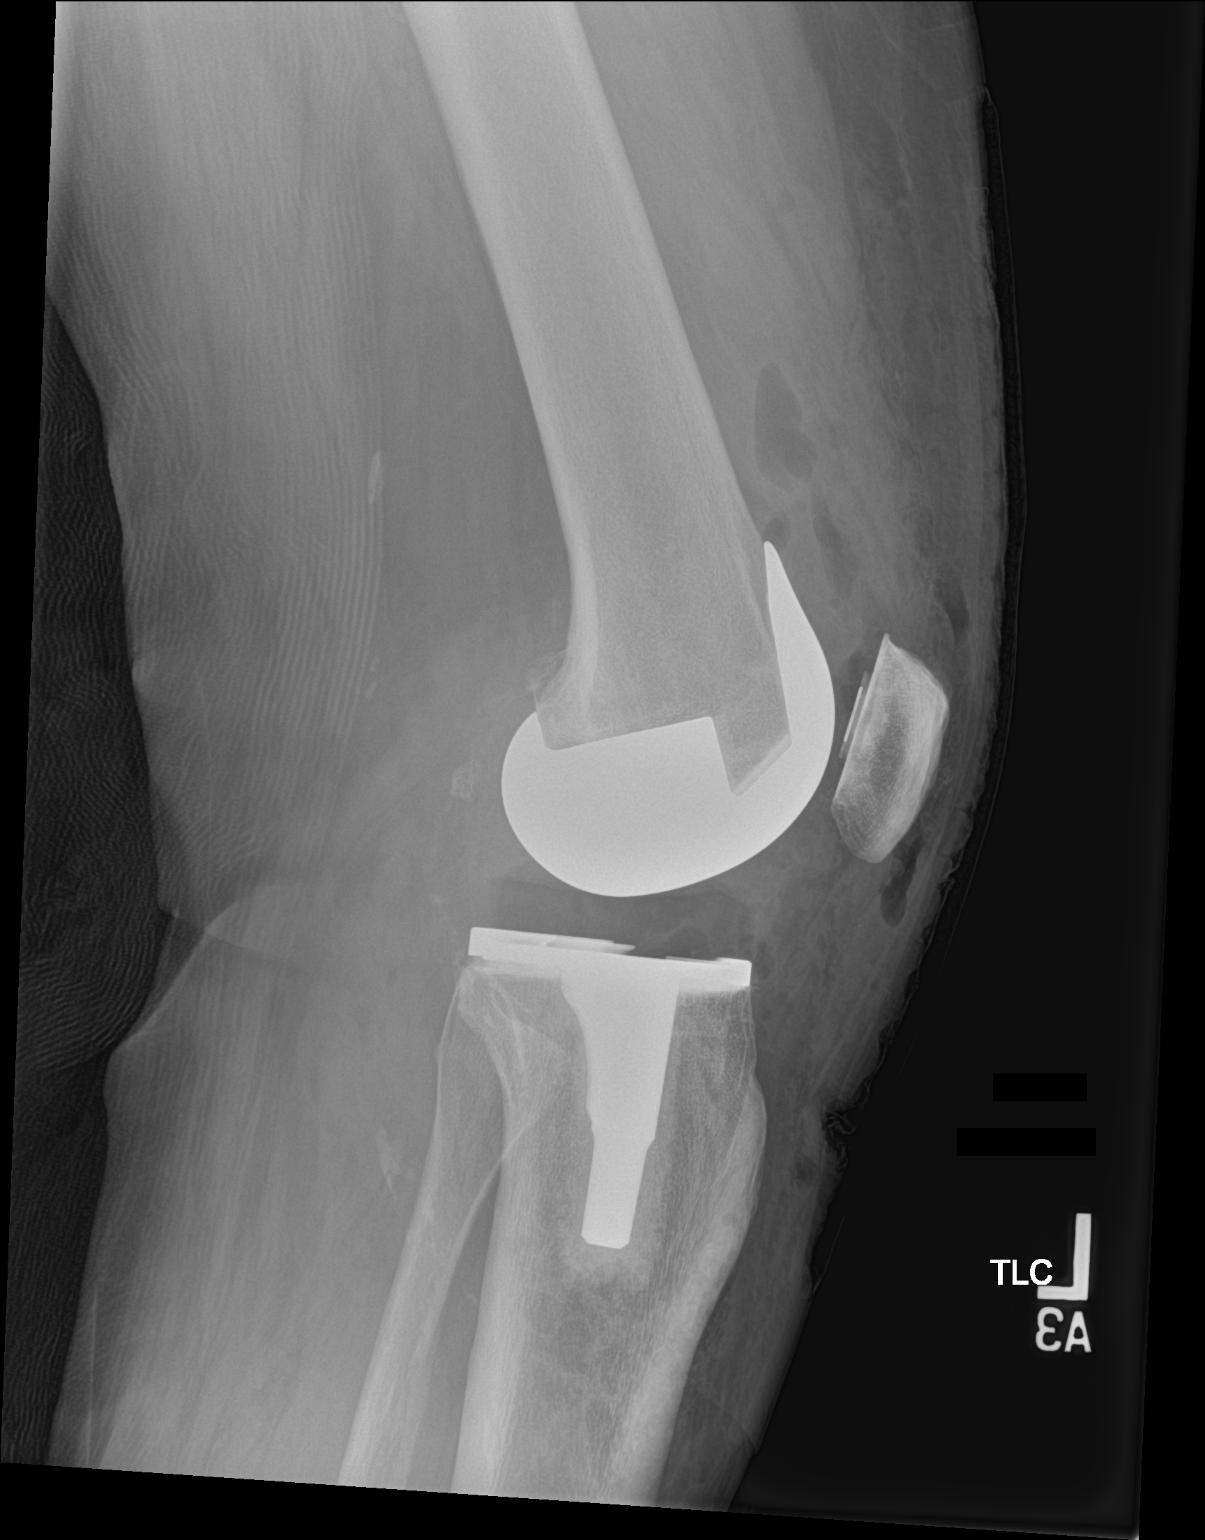

[2 of 2 positions shown; findings below may reference images not displayed]

FINDINGS: Left knee prosthesis is seen. No acute bony or soft tissue
abnormality is noted.
IMPRESSION: Status post left knee replacement

## 2018-12-26 IMAGING — DX DG CHEST 1V PORT
1 series · 1 of 1 positions shown · non-contrast
Comparison: 01/21/2018

CLINICAL DATA: reports of a fever that started 9400 today. Pt.
Reports temp. Was 100.1. Pt. Took 5222 milligrams of Tylenol at
4058. Pt. Had recent left knee replacement. Bandage is dry and
clean. Left knee feels hot to touch.

EXAM:
PORTABLE CHEST 1 VIEW

[chest ap]
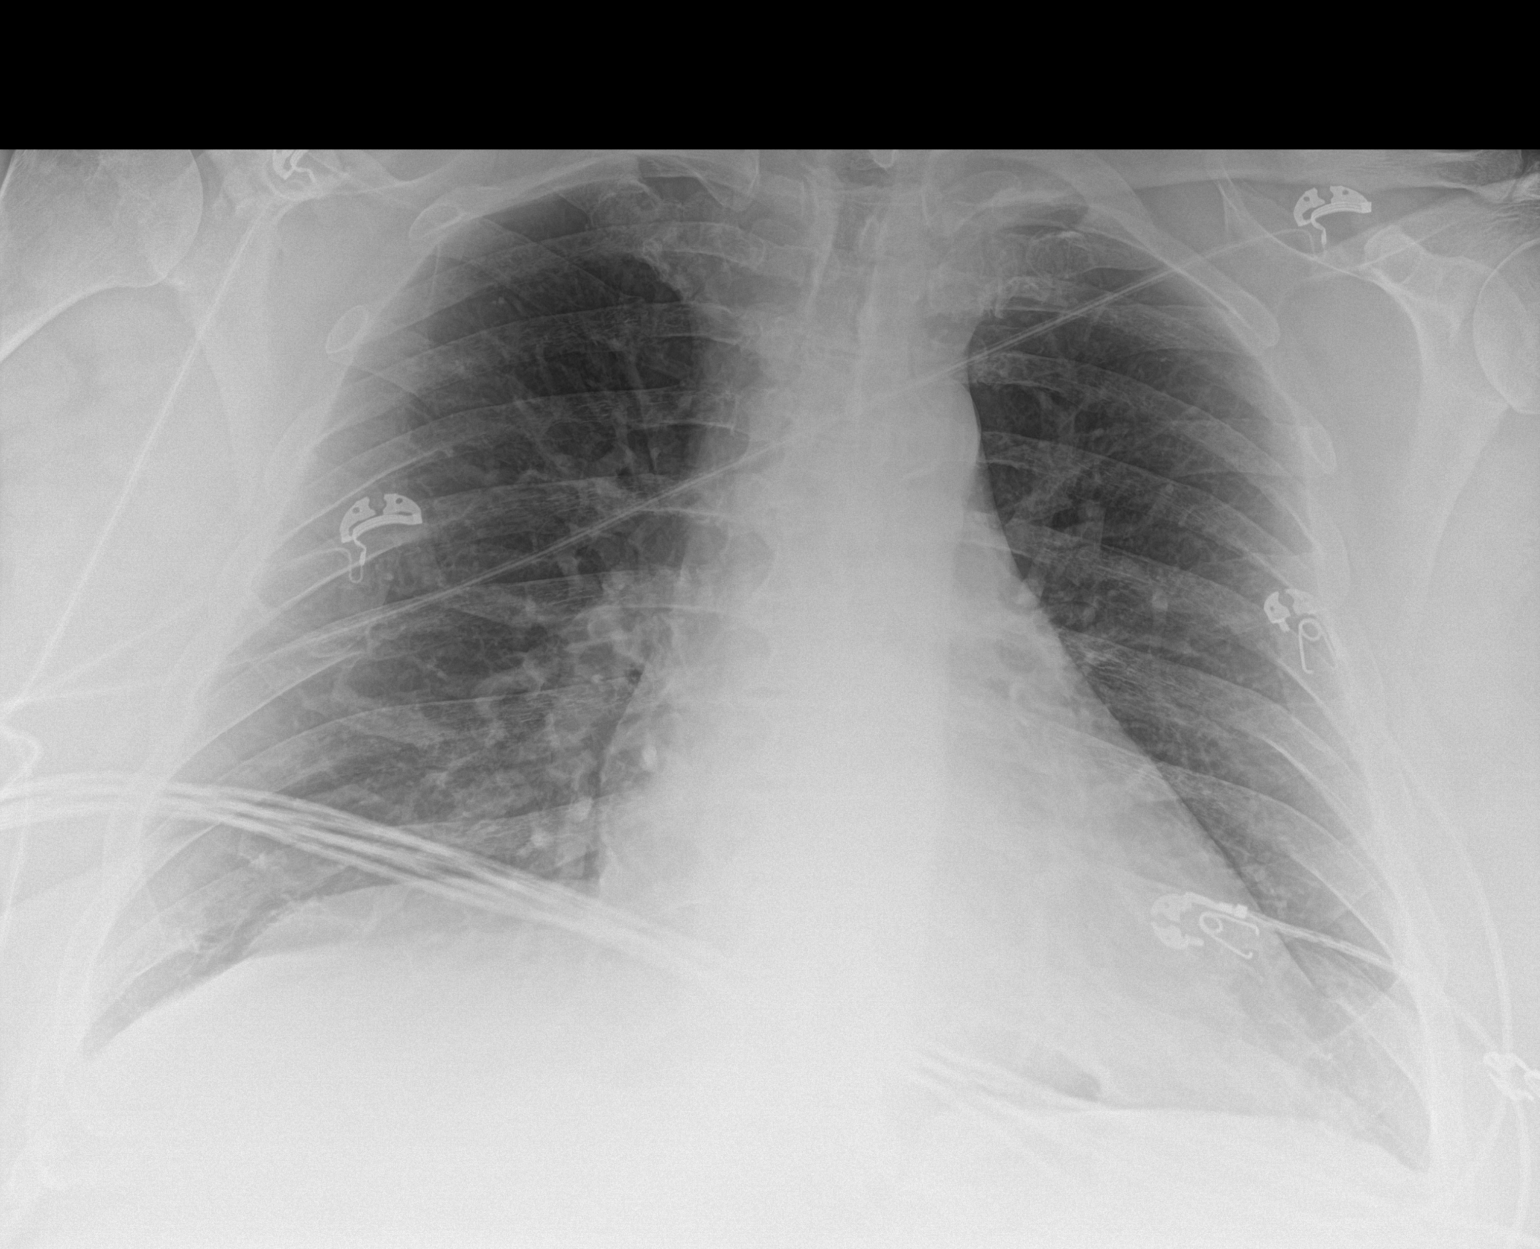

[1 of 1 positions shown; findings below may reference images not displayed]

FINDINGS: Heart size is accentuated by the portable AP technique. The lungs
are free of focal consolidations and pleural effusions. No pulmonary
edema.
IMPRESSION: No active disease.

## 2019-02-04 ENCOUNTER — Ambulatory Visit: Payer: Self-pay | Admitting: Orthopaedic Surgery

## 2019-02-04 ENCOUNTER — Ambulatory Visit: Payer: Medicare Other | Admitting: Physician Assistant

## 2019-02-08 ENCOUNTER — Ambulatory Visit: Payer: Medicare Other | Admitting: Orthopaedic Surgery

## 2019-02-15 ENCOUNTER — Encounter: Payer: Self-pay | Admitting: Orthopaedic Surgery

## 2019-02-15 ENCOUNTER — Ambulatory Visit: Payer: Self-pay

## 2019-02-15 ENCOUNTER — Other Ambulatory Visit: Payer: Self-pay

## 2019-02-15 ENCOUNTER — Ambulatory Visit: Payer: Medicare Other | Admitting: Orthopaedic Surgery

## 2019-02-15 DIAGNOSIS — Z96652 Presence of left artificial knee joint: Secondary | ICD-10-CM | POA: Diagnosis not present

## 2019-02-15 NOTE — Progress Notes (Signed)
Post-Op Visit Note   Patient: [name]           Date of Birth: July 29, 1955           MRN: [id number] Visit Date: 02/15/2019 PCP: Merlene Laughter, MD   Assessment & Plan:  Chief Complaint:  Chief Complaint  Patient presents with  . Left Knee - Pain   Visit Diagnoses:  1. S/P TKR (total knee replacement), left     Plan: Patient is a pleasant 63 year old gentleman who presents our clinic today 13 months status post left total knee replacement, date of surgery 01/29/2018.  He has been doing very well.  He has an occasional ache but nothing more.  He has regained near full range of motion and strength.  Overall doing very well.  Examination of his left knee reveals a fully healed surgical scar.  No evidence of infection or cellulitis.  His calf is soft nontender.  Range of motion 0 to 120 degrees.  Stable valgus varus stress.  At this point, he will continue with activity as tolerated.  We would like to watch this a little more carefully and we will have him follow-up in 6 months for repeat evaluation and 2 view x-rays of the left knee with the same angle as the x-rays that were done today.  Dental prophylaxis reinforced.  Call with concerns or questions in the meantime.  Follow-Up Instructions: Return in about 6 months (around 08/15/2019).   Orders:  Orders Placed This Encounter  Procedures  . XR KNEE 3 VIEW LEFT   No orders of the defined types were placed in this encounter.   Imaging: Xr Knee 3 View Left  Result Date: 02/15/2019 X-rays demonstrate a well-seated prosthesis.  There is a small lucency to the medial and lateral tibial component between the tray and tibia itself.  There is no subsidence.   PMFS History: Patient Active Problem List   Diagnosis Date Noted  . Body mass index 40.0-44.9, adult (HCC) 02/16/2018  . Primary osteoarthritis of left knee 01/29/2018  . S/P TKR (total knee replacement), left 01/29/2018  . Primary gout 01/11/2014  . CAD (coronary artery  disease), native coronary artery 07/26/2013  . Essential hypertension 07/26/2013  . Hyperlipidemia 07/26/2013  . Morbid obesity (HCC) 07/26/2013  . Obstructive sleep apnea 07/26/2013   Past Medical History:  Diagnosis Date  . Arthritis    "ankles, knees" (01/29/2018)  . CAD in native artery    cath report from 02/2017 indicates patient had a history of RCA stent >10 years prior to that. At time of that cath he was found to have EF 70%, 20-30% ostial LAD, 30-40% prox LAD, 30-40% Cx, 80-90% Cx beyond OM1, totally occluded RCA with left-to-right collaterals.  . Diabetes mellitus type 2 in obese (HCC)   . GERD (gastroesophageal reflux disease)   . Hyperlipidemia   . Hypertension   . Morbid obesity (HCC)   . OSA on CPAP     Family History  Problem Relation Age of Onset  . Heart attack Mother   . Heart attack Father     Past Surgical History:  Procedure Laterality Date  . CARDIAC CATHETERIZATION  2008  . CORONARY ANGIOPLASTY WITH STENT PLACEMENT  1997  . JOINT REPLACEMENT    . KNEE ARTHROSCOPY Bilateral    "meniscus repairs"  . TONSILLECTOMY    . TOTAL KNEE ARTHROPLASTY Left 01/29/2018  . TOTAL KNEE ARTHROPLASTY Left 01/29/2018   Procedure: LEFT TOTAL KNEE ARTHROPLASTY;  Surgeon: Roda Shutters,  Marylynn Pearson, MD;  Location: [facility];  Service: Orthopedics;  Laterality: Left;  . UVULOPALATOPHARYNGOPLASTY  ~ 1994   Social History   Occupational History  . Not on file  Tobacco Use  . Smoking status: Never Smoker  . Smokeless tobacco: Never Used  Substance and Sexual Activity  . Alcohol use: Yes    Comment: 01/29/2018 "may have 1 beer/month; if that"  . Drug use: Never  . Sexual activity: Not Currently

## 2019-02-17 ENCOUNTER — Ambulatory Visit: Payer: Medicare Other | Admitting: Podiatry

## 2019-02-17 ENCOUNTER — Other Ambulatory Visit: Payer: Self-pay

## 2019-02-17 DIAGNOSIS — M79675 Pain in left toe(s): Secondary | ICD-10-CM | POA: Diagnosis not present

## 2019-02-17 DIAGNOSIS — E1140 Type 2 diabetes mellitus with diabetic neuropathy, unspecified: Secondary | ICD-10-CM

## 2019-02-17 DIAGNOSIS — M79674 Pain in right toe(s): Secondary | ICD-10-CM

## 2019-02-17 DIAGNOSIS — B351 Tinea unguium: Secondary | ICD-10-CM | POA: Diagnosis not present

## 2019-02-23 NOTE — Progress Notes (Signed)
Subjective: 63 y.o. returns the office today for painful, elongated, thickened toenails which he cannot trim himself. Denies any redness or drainage around the nails.  Denies any systemic complaints such as fevers, chills, nausea, vomiting.   PCP: Lajean Manes, MD  Objective: AAO 3, NAD; presents wearing regular shoes.  DP/PT pulses palpable, CRT less than 3 seconds Protective sensation decreased with Simms Weinstein monofilament Nails hypertrophic, dystrophic, elongated, brittle, discolored 10. There is tenderness overlying the nails 1-5 bilaterally. There is no surrounding erythema or drainage along the nail sites. No open lesions or pre-ulcerative lesions are identified.  Flatfoot right > left with prominence of the talar head.  No area pinpoint bony tenderness or pain to vibratory sensation. No pain with calf compression, swelling, warmth, erythema.  Assessment: Patient presents with symptomatic onychomycosis; neuropathy  Plan: -Treatment options including alternatives, risks, complications were discussed -Nails sharply debrided 10 without complication/bleeding. -Discussed daily foot inspection. If there are any changes, to call the office immediately.  -Follow-up in 3 months or sooner if any problems are to arise. In the meantime, encouraged to call the office with any questions, concerns, changes symptoms.  Celesta Gentile, DPM

## 2019-03-11 ENCOUNTER — Other Ambulatory Visit: Payer: Self-pay | Admitting: Podiatry

## 2019-03-14 NOTE — Telephone Encounter (Signed)
Left message with Lyrica refill.

## 2019-05-20 ENCOUNTER — Ambulatory Visit: Payer: Medicare Other | Admitting: Podiatry

## 2019-05-24 ENCOUNTER — Ambulatory Visit (INDEPENDENT_AMBULATORY_CARE_PROVIDER_SITE_OTHER): Payer: Medicare PPO | Admitting: Podiatry

## 2019-05-24 ENCOUNTER — Other Ambulatory Visit: Payer: Self-pay

## 2019-05-24 DIAGNOSIS — M79674 Pain in right toe(s): Secondary | ICD-10-CM | POA: Diagnosis not present

## 2019-05-24 DIAGNOSIS — M79675 Pain in left toe(s): Secondary | ICD-10-CM

## 2019-05-24 DIAGNOSIS — E1140 Type 2 diabetes mellitus with diabetic neuropathy, unspecified: Secondary | ICD-10-CM | POA: Diagnosis not present

## 2019-05-24 DIAGNOSIS — B351 Tinea unguium: Secondary | ICD-10-CM

## 2019-05-30 NOTE — Progress Notes (Signed)
Subjective: 64 y.o. returns the office today for painful, elongated, thickened toenails which he cannot trim himself.  Started noticed that his left big toenail turned darker couple weeks after last saw him.  This has grown out and the color has improved.  Denies any redness or drainage around the nails.  Denies any systemic complaints such as fevers, chills, nausea, vomiting.   PCP: Merlene Laughter, MD  Objective: AAO 3, NAD; presents wearing regular shoes.  DP/PT pulses palpable, CRT less than 3 seconds Protective sensation decreased with Simms Weinstein monofilament Nails hypertrophic, dystrophic, elongated, brittle, discolored 10. There is tenderness overlying the nails 1-5 bilaterally. There is no surrounding erythema or drainage along the nail sites.  There is some dried blood present on the left hallux on the mid substance it appears to be growing out.  No extension of any hyperpigmentation of the surrounding skin. No open lesions or pre-ulcerative lesions are identified.  Flatfoot right > left with prominence of the talar head.  No area pinpoint bony tenderness or pain to vibratory sensation. No pain with calf compression, swelling, warmth, erythema.  Assessment: Patient presents with symptomatic onychomycosis; neuropathy  Plan: -Treatment options including alternatives, risks, complications were discussed -Nails sharply debrided 10 without complication/bleeding.  Her left hallux toenail what appears to be subungual hematoma which is growing out. -Discussed daily foot inspection. If there are any changes, to call the office immediately.  -Follow-up in 3 months or sooner if any problems are to arise. In the meantime, encouraged to call the office with any questions, concerns, changes symptoms.  Ovid Curd, DPM

## 2019-07-08 DIAGNOSIS — G4733 Obstructive sleep apnea (adult) (pediatric): Secondary | ICD-10-CM | POA: Diagnosis not present

## 2019-07-18 ENCOUNTER — Telehealth: Payer: Self-pay | Admitting: *Deleted

## 2019-07-18 MED ORDER — CELECOXIB 200 MG PO CAPS: 200.0000 mg | ORAL_CAPSULE | Freq: Every day | ORAL | 11 refills | Status: DC

## 2019-07-18 NOTE — Telephone Encounter (Signed)
Incoming fax request for celebrex. Dr. Bary Castilla refill as previously.

## 2019-08-13 ENCOUNTER — Other Ambulatory Visit: Payer: Self-pay | Admitting: Interventional Cardiology

## 2019-08-15 ENCOUNTER — Other Ambulatory Visit: Payer: Self-pay

## 2019-08-16 ENCOUNTER — Ambulatory Visit (INDEPENDENT_AMBULATORY_CARE_PROVIDER_SITE_OTHER): Payer: Medicare PPO

## 2019-08-16 ENCOUNTER — Ambulatory Visit: Payer: Medicare PPO | Admitting: Orthopaedic Surgery

## 2019-08-16 ENCOUNTER — Other Ambulatory Visit: Payer: Self-pay

## 2019-08-16 ENCOUNTER — Encounter: Payer: Self-pay | Admitting: Orthopaedic Surgery

## 2019-08-16 DIAGNOSIS — Z96652 Presence of left artificial knee joint: Secondary | ICD-10-CM

## 2019-08-16 NOTE — Progress Notes (Signed)
Patient: [name]           Date of Birth: [birth year]           MRN: [id number] Visit Date: 08/16/2019 PCP: Merlene Laughter, MD   Assessment & Plan:  Chief Complaint:  Chief Complaint  Patient presents with  . Left Knee - Follow-up    Left TKA DOS 01/29/18   Visit Diagnoses:  1. S/P TKR (total knee replacement), left     Plan: Patient is a pleasant 64 year old gentleman who comes in today for follow-up of his left total knee replacement.  He is approximately 1-1/2 years out from surgery.  01/29/2018.  He has been doing fairly well.  He notices a slight irritation when going downhill to the anterolateral aspect of the knee but nothing more.  He does take Tylenol at night for other joint pains.  Examination of his left knee shows no effusion.  Range of motion from 0 to 120 degrees.  He is stable to valgus varus stress.  He has slight tenderness to the anterolateral joint line.  He is neurovascular intact distally.  At this point, there is really no change in x-rays compared to 6 months ago.  He will continue to advance with activity and follow-up with [place] in 1 years time for AP lateral left knee x-rays.  Dental prophylaxis reinforced for another 6 months.  Call with concerns or questions.  Follow-Up Instructions: Return in about 1 year (around 08/15/2020).   Orders:  Orders Placed This Encounter  Procedures  . XR Knee 1-2 Views Left   No orders of the defined types were placed in this encounter.   Imaging: XR Knee 1-2 Views Left  Result Date: 08/16/2019 X-rays demonstrate a well-seated prosthesis.  He has lucency to the tibial plate on both sides but this is unchanged from 6 months ago.   PMFS History: Patient Active Problem List   Diagnosis Date Noted  . Body mass index 40.0-44.9, adult (HCC) 02/16/2018  . Primary osteoarthritis of left knee 01/29/2018  . S/P TKR (total knee replacement), left 01/29/2018  . Primary gout 01/11/2014  . CAD (coronary artery disease),  native coronary artery 07/26/2013  . Essential hypertension 07/26/2013  . Hyperlipidemia 07/26/2013  . Morbid obesity (HCC) 07/26/2013  . Obstructive sleep apnea 07/26/2013   Past Medical History:  Diagnosis Date  . Arthritis    "ankles, knees" (01/29/2018)  . CAD in native artery    cath report from 02/2017 indicates patient had a history of RCA stent >10 years prior to that. At time of that cath he was found to have EF 70%, 20-30% ostial LAD, 30-40% prox LAD, 30-40% Cx, 80-90% Cx beyond OM1, totally occluded RCA with left-to-right collaterals.  . Diabetes mellitus type 2 in obese (HCC)   . GERD (gastroesophageal reflux disease)   . Hyperlipidemia   . Hypertension   . Morbid obesity (HCC)   . OSA on CPAP     Family History  Problem Relation Age of Onset  . Heart attack Mother   . Heart attack Father     Past Surgical History:  Procedure Laterality Date  . CARDIAC CATHETERIZATION  2008  . CORONARY ANGIOPLASTY WITH STENT PLACEMENT  1997  . JOINT REPLACEMENT    . KNEE ARTHROSCOPY Bilateral    "meniscus repairs"  . TONSILLECTOMY    . TOTAL KNEE ARTHROPLASTY Left 01/29/2018  . TOTAL KNEE ARTHROPLASTY Left 01/29/2018   Procedure: LEFT TOTAL KNEE ARTHROPLASTY;  Surgeon: Gershon Mussel  M, MD;  Location: [facility];  Service: Orthopedics;  Laterality: Left;  . UVULOPALATOPHARYNGOPLASTY  ~ 1994   Social History   Occupational History  . Not on file  Tobacco Use  . Smoking status: Never Smoker  . Smokeless tobacco: Never Used  Substance and Sexual Activity  . Alcohol use: Yes    Comment: 01/29/2018 "may have 1 beer/month; if that"  . Drug use: Never  . Sexual activity: Not Currently

## 2019-08-23 ENCOUNTER — Ambulatory Visit: Payer: Medicare PPO | Admitting: Podiatry

## 2019-08-23 ENCOUNTER — Other Ambulatory Visit: Payer: Self-pay

## 2019-08-23 ENCOUNTER — Encounter: Payer: Self-pay | Admitting: Podiatry

## 2019-08-23 DIAGNOSIS — M79675 Pain in left toe(s): Secondary | ICD-10-CM | POA: Diagnosis not present

## 2019-08-23 DIAGNOSIS — B351 Tinea unguium: Secondary | ICD-10-CM | POA: Diagnosis not present

## 2019-08-23 DIAGNOSIS — M79674 Pain in right toe(s): Secondary | ICD-10-CM | POA: Diagnosis not present

## 2019-08-23 DIAGNOSIS — E1140 Type 2 diabetes mellitus with diabetic neuropathy, unspecified: Secondary | ICD-10-CM | POA: Diagnosis not present

## 2019-08-23 NOTE — Progress Notes (Signed)
Subjective: 64 y.o. returns the office today for painful, elongated, thickened toenails which he cannot trim himself.  He also gets occasional discomfort to his feet and goes barefoot as well as he wears good arch support he does not have any pain.  No recent weakness or falls.  Denies any redness or drainage around the nails.  Denies any systemic complaints such as fevers, chills, nausea, vomiting.   PCP: Merlene Laughter, MD  Objective: AAO 3, NAD; presents wearing regular shoes.  DP/PT pulses palpable, CRT less than 3 seconds Protective sensation decreased with Simms Weinstein monofilament Nails hypertrophic, dystrophic, elongated, brittle, discolored 10. There is tenderness overlying the nails 1-5 bilaterally. There is no surrounding erythema or drainage along the nail sites.  There is some dried blood present on the left hallux on the mid substance it appears to be growing out.  No extension of any hyperpigmentation of the surrounding skin. No open lesions or pre-ulcerative lesions are identified.  Flatfoot right > left with prominence of the talar head.  No area pinpoint bony tenderness or pain to vibratory sensation. No pain with calf compression, swelling, warmth, erythema.  Assessment: Patient presents with symptomatic onychomycosis; neuropathy  Plan: -Treatment options including alternatives, risks, complications were discussed -Nails sharply debrided 10 without complication/bleeding.   -Discussed daily foot inspection. If there are any changes, to call the office immediately. -We will follow-up with Raiford Noble for new orthotics.  -Follow-up in 3 months or sooner if any problems are to arise. In the meantime, encouraged to call the office with any questions, concerns, changes symptoms.  Ovid Curd, DPM

## 2019-08-24 DIAGNOSIS — N312 Flaccid neuropathic bladder, not elsewhere classified: Secondary | ICD-10-CM | POA: Diagnosis not present

## 2019-08-24 DIAGNOSIS — N3020 Other chronic cystitis without hematuria: Secondary | ICD-10-CM | POA: Diagnosis not present

## 2019-09-13 ENCOUNTER — Telehealth: Payer: Self-pay | Admitting: *Deleted

## 2019-09-13 ENCOUNTER — Other Ambulatory Visit: Payer: Self-pay

## 2019-09-13 MED ORDER — PREGABALIN 150 MG PO CAPS: 150.0000 mg | ORAL_CAPSULE | Freq: Two times a day (BID) | ORAL | 5 refills | Status: DC

## 2019-09-13 MED ORDER — ISOSORBIDE MONONITRATE ER 120 MG PO TB24: ORAL_TABLET | ORAL | 0 refills | Status: DC

## 2019-09-13 MED ORDER — ISOSORBIDE MONONITRATE ER 120 MG PO TB24: ORAL_TABLET | ORAL | 1 refills | Status: DC

## 2019-09-13 NOTE — Addendum Note (Signed)
Addended by: Margaret Pyle D on: 09/13/2019 12:23 PM   Modules accepted: Orders

## 2019-09-13 NOTE — Telephone Encounter (Signed)
Left message for Orders for Lyrica to [facility] [phone]

## 2019-09-15 ENCOUNTER — Other Ambulatory Visit: Payer: Self-pay | Admitting: *Deleted

## 2019-10-19 NOTE — Progress Notes (Signed)
Cardiology Office Note:    Date:  10/21/2019   ID:  BREVAN LUBERTO, DOB 09-17-1955, MRN [id number]  PCP:  Merlene Laughter, MD  Cardiologist:  Lesleigh Noe, MD   Referring MD: Merlene Laughter, MD   Chief Complaint  Patient presents with  . Coronary Artery Disease    History of Present Illness:    [name] is a 64 y.o. male with a hx of hypertension, sleep apnea (uses CPAP), morbid obesity, DM, and hyperlipidemia, CAD with prior RCA STENT (2008 cath: EF 70%, 20-30% ostial LAD, 30-40% prox LAD, 30-40% Cx, 80-90% Cx beyond OM1, totally occluded RCA with left-to-right collaterals). This was treated medically.   Mrs. [name] has no chest discomfort, dyspnea, orthopnea, edema, or syncope.  He has occasional palpitations.  Overall he feels stable from cardiac standpoint.  Past Medical History:  Diagnosis Date  . Arthritis    "ankles, knees" (01/29/2018)  . CAD in native artery    cath report from 02/2017 indicates patient had a history of RCA stent >10 years prior to that. At time of that cath he was found to have EF 70%, 20-30% ostial LAD, 30-40% prox LAD, 30-40% Cx, 80-90% Cx beyond OM1, totally occluded RCA with left-to-right collaterals.  . Diabetes mellitus type 2 in obese (HCC)   . GERD (gastroesophageal reflux disease)   . Hyperlipidemia   . Hypertension   . Morbid obesity (HCC)   . OSA on CPAP     Past Surgical History:  Procedure Laterality Date  . CARDIAC CATHETERIZATION  2008  . CORONARY ANGIOPLASTY WITH STENT PLACEMENT  1997  . JOINT REPLACEMENT    . KNEE ARTHROSCOPY Bilateral    "meniscus repairs"  . TONSILLECTOMY    . TOTAL KNEE ARTHROPLASTY Left 01/29/2018  . TOTAL KNEE ARTHROPLASTY Left 01/29/2018   Procedure: LEFT TOTAL KNEE ARTHROPLASTY;  Surgeon: Tarry Kos, MD;  Location: [facility];  Service: Orthopedics;  Laterality: Left;  . UVULOPALATOPHARYNGOPLASTY  ~ 1994    Current Medications: Current Meds  Medication Sig  . acetaminophen (TYLENOL) 650 MG CR  tablet Take 1,300 mg by mouth at bedtime.  Marland Kitchen aspirin EC 325 MG tablet Take 325 mg by mouth daily.  Marland Kitchen atorvastatin (LIPITOR) 80 MG tablet Take 80 mg by mouth at bedtime.   . celecoxib (CELEBREX) 200 MG capsule Take 1 capsule (200 mg total) by mouth daily.  . Cholecalciferol (VITAMIN D3) 2000 units TABS Take 2,000 Units by mouth daily.  . Glucosamine-Chondroitin (COSAMIN DS PO) Take 2 tablets by mouth daily with breakfast.   . isosorbide mononitrate (IMDUR) 120 MG 24 hr tablet TAKE 1 TABLET(120 MG) BY MOUTH DAILY. Please keep upcoming appt in July with Dr. Katrinka Blazing before anymore refills. Thank you  . lisinopril (PRINIVIL,ZESTRIL) 5 MG tablet Take 5 mg by mouth at bedtime.   . Menthol, Topical Analgesic, (BIOFREEZE) 4 % GEL Apply 1 application topically See admin instructions. Apply to both feet at bedtime for neuropathy  . metFORMIN (GLUCOPHAGE-XR) 500 MG 24 hr tablet Take 500 mg by mouth at bedtime.   . Multiple Vitamin (MULTIVITAMIN) tablet Take 1 tablet by mouth daily.  . nitroGLYCERIN (NITROSTAT) 0.4 MG SL tablet Place 1 tablet (0.4 mg total) under the tongue every 5 (five) minutes x 3 doses as needed for chest pain.  Myriam Forehand [facility]  Peripheral Neuropathy Cream- Bupivacaine 1%, Doxepin 3%, Gabapentin 6%, Pentoxifylline 3%, Topiramate 1% Apply 1-2 grams to affected area 3-4 times daily Qty. 120 gm  3 refills  . omeprazole (PRILOSEC) 20 MG capsule Take 20 mg by mouth daily.   . pregabalin (LYRICA) 150 MG capsule Take 1 capsule (150 mg total) by mouth 2 (two) times daily.  Marland Kitchen senna-docusate (SENOKOT S) 8.6-50 MG tablet Take 1 tablet by mouth at bedtime as needed.  . Tetrahydrozoline HCl (VISINE OP) Place 1 drop into both eyes daily as needed (for dry or irritated eyes).   . triamterene-hydrochlorothiazide (MAXZIDE) 75-50 MG per tablet Take 0.5 tablets by mouth daily.   [name] 625 MG tablet Take 1,875 mg by mouth at bedtime.   . [DISCONTINUED] metoprolol (LOPRESSOR) 50 MG  tablet TAKE ONE TABLET BY MOUTH TWICE DAILY  . [DISCONTINUED] nitroGLYCERIN (NITROSTAT) 0.4 MG SL tablet Place 1 tablet (0.4 mg total) under the tongue every 5 (five) minutes x 3 doses as needed for chest pain.     Allergies:   Penicillins   Social History   Socioeconomic History  . Marital status: Married    Spouse name: Not on file  . Number of children: Not on file  . Years of education: Not on file  . Highest education level: Not on file  Occupational History  . Not on file  Tobacco Use  . Smoking status: Never Smoker  . Smokeless tobacco: Never Used  Vaping Use  . Vaping Use: Never used  Substance and Sexual Activity  . Alcohol use: Yes    Comment: 01/29/2018 "may have 1 beer/month; if that"  . Drug use: Never  . Sexual activity: Not Currently  Other Topics Concern  . Not on file  Social History Narrative  . Not on file   Social Determinants of Health   Financial Resource Strain:   . Difficulty of Paying Living Expenses:   Food Insecurity:   . Worried About Programme researcher, broadcasting/film/video in the Last Year:   . Barista in the Last Year:   Transportation Needs:   . Freight forwarder (Medical):   Marland Kitchen Lack of Transportation (Non-Medical):   Physical Activity:   . Days of Exercise per Week:   . Minutes of Exercise per Session:   Stress:   . Feeling of Stress :   Social Connections:   . Frequency of Communication with Friends and Family:   . Frequency of Social Gatherings with Friends and Family:   . Attends Religious Services:   . Active Member of Clubs or Organizations:   . Attends Banker Meetings:   Marland Kitchen Marital Status:      Family History: The patient's family history includes Heart attack in his father and mother.  ROS:   Please see the history of present illness.    He is under immense stress because his wife has multiple sclerosis and she is essentially bedridden, requiring his personal attention for all of her health care needs.  All other  systems reviewed and are negative.  EKGs/Labs/Other Studies Reviewed:    The following studies were reviewed today: We have not done any recent imaging in absence of symptoms.  EKG:  EKG sinus bradycardia but otherwise normal with the exception of possible old inferior infarct  Recent Labs: No results found for requested labs within last 8760 hours.  Recent Lipid Panel    Component Value Date/Time   CHOL  03/04/2007 0530    165        ATP III CLASSIFICATION:  <200     mg/dL   Desirable  782-423  mg/dL  Borderline High  >=240    mg/dL   High   TRIG 010 (H) 27/25/3664 0530   HDL 30 (L) 03/04/2007 0530   CHOLHDL 5.5 03/04/2007 0530   VLDL 74 (H) 03/04/2007 0530   LDLCALC  03/04/2007 0530    61        Total Cholesterol/HDL:CHD Risk Coronary Heart Disease Risk Table                     Men   Women  1/2 Average Risk   3.4   3.3    Physical Exam:    VS:  BP 108/70   Pulse (!) 58   Ht 5\' 8"  (1.727 m)   Wt 281 lb 3.2 oz (127.6 kg)   SpO2 96%   BMI 42.76 kg/m     Wt Readings from Last 3 Encounters:  10/21/19 281 lb 3.2 oz (127.6 kg)  08/26/18 284 lb 8 oz (129 kg)  02/07/18 285 lb (129.3 kg)     GEN: Morbidly obese. No acute distress HEENT: Normal NECK: No JVD. LYMPHATICS: No lymphadenopathy CARDIAC:  RRR without murmur, gallop, or edema. VASCULAR:  Normal Pulses. No bruits. RESPIRATORY:  Clear to auscultation without rales, wheezing or rhonchi  ABDOMEN: Soft, non-tender, non-distended, No pulsatile mass, MUSCULOSKELETAL: No deformity  SKIN: Warm and dry NEUROLOGIC:  Alert and oriented x 3 PSYCHIATRIC:  Normal affect   ASSESSMENT:    1. Coronary artery disease involving native coronary artery of native heart without angina pectoris   2. Essential hypertension   3. Mixed hyperlipidemia   4. OSA on CPAP   5. Educated about COVID-19 virus infection   6. Morbid obesity (HCC)    PLAN:    In order of problems listed above:  1. He is doing well and denies  angina.  Secondary prevention discussed in detail and reviewed with the patient as on prior visits.  Switch from metoprolol tartrate to metoprolol succinate 100 mg/day. 2. Continue ramipril, triamterene HCTZ, and metoprolol (switching to succinate. 3. Continue high intensity statin therapy and consider adding omega-3 fish fatty acid, Vascepa. 4. He advocates compliance with CPAP. 5. He has been vaccinated and did not suffer COVID-19 during the pandemic. 6. Physical activity and weight loss is encouraged. 7. Would recommend starting SGLT2, either 02/09/18 or Jardiance.  Overall education and awareness concerning primary/secondary risk prevention was discussed in detail: LDL less than 70, hemoglobin A1c less than 7, blood pressure target less than 130/80 mmHg, >150 minutes of moderate aerobic activity per week, avoidance of smoking, weight control (via diet and exercise), and continued surveillance/management of/for obstructive sleep apnea.    Medication Adjustments/Labs and Tests Ordered: Current medicines are reviewed at length with the patient today.  Concerns regarding medicines are outlined above.  Orders Placed This Encounter  Procedures  . EKG 12-Lead   Meds ordered this encounter  Medications  . nitroGLYCERIN (NITROSTAT) 0.4 MG SL tablet    Sig: Place 1 tablet (0.4 mg total) under the tongue every 5 (five) minutes x 3 doses as needed for chest pain.    Dispense:  25 tablet    Refill:  3  . metoprolol succinate (TOPROL-XL) 100 MG 24 hr tablet    Sig: Take 1 tablet (100 mg total) by mouth daily. Take with or immediately following a meal.    Dispense:  90 tablet    Refill:  3    D/c Tartrate    Patient Instructions  Medication Instructions:  1) DISCONTINUE  Metoprolol Tartrate 2) START Metoprolol Succinate 100mg  once daily  *If you need a refill on your cardiac medications before your next appointment, please call your pharmacy*   Lab Work: None If you have labs (blood work)  drawn today and your tests are completely normal, you will receive your results only by: Marland Kitchen. MyChart Message (if you have MyChart) OR . A paper copy in the mail If you have any lab test that is abnormal or we need to change your treatment, we will call you to review the results.   Testing/Procedures: None   Follow-Up: At [facility], you and your health needs are our priority.  As part of our continuing mission to provide you with exceptional heart care, we have created designated Provider Care Teams.  These Care Teams include your primary Cardiologist (physician) and Advanced Practice Providers (APPs -  Physician Assistants and Nurse Practitioners) who all work together to provide you with the care you need, when you need it.  We recommend signing up for the patient portal called "MyChart".  Sign up information is provided on this After Visit Summary.  MyChart is used to connect with patients for Virtual Visits (Telemedicine).  Patients are able to view lab/test results, encounter notes, upcoming appointments, etc.  Non-urgent messages can be sent to your provider as well.   To learn more about what you can do with MyChart, go to [email or web address].    Your next appointment:   12 month(s)  The format for your next appointment:   In Person  Provider:   You may see Lesleigh NoeHenry W Dimino III, MD or one of the following Advanced Practice Providers on your designated Care Team:    Norma FredricksonLori Gerhardt, NP  Nada BoozerLaura Ingold, NP  Georgie ChardJill McDaniel, NP    Other Instructions      Signed, Lesleigh NoeHenry W Netherton III, MD  10/21/2019 8:33 AM    [facility]

## 2019-10-21 ENCOUNTER — Encounter: Payer: Self-pay | Admitting: Interventional Cardiology

## 2019-10-21 ENCOUNTER — Other Ambulatory Visit: Payer: Self-pay

## 2019-10-21 ENCOUNTER — Ambulatory Visit: Payer: Medicare PPO | Admitting: Interventional Cardiology

## 2019-10-21 VITALS — BP 108/70 | HR 58 | Ht 68.0 in | Wt 281.2 lb

## 2019-10-21 DIAGNOSIS — E782 Mixed hyperlipidemia: Secondary | ICD-10-CM

## 2019-10-21 DIAGNOSIS — I2510 Atherosclerotic heart disease of native coronary artery without angina pectoris: Secondary | ICD-10-CM

## 2019-10-21 DIAGNOSIS — Z7189 Other specified counseling: Secondary | ICD-10-CM | POA: Diagnosis not present

## 2019-10-21 DIAGNOSIS — E1159 Type 2 diabetes mellitus with other circulatory complications: Secondary | ICD-10-CM | POA: Diagnosis not present

## 2019-10-21 DIAGNOSIS — Z9989 Dependence on other enabling machines and devices: Secondary | ICD-10-CM

## 2019-10-21 DIAGNOSIS — G4733 Obstructive sleep apnea (adult) (pediatric): Secondary | ICD-10-CM | POA: Diagnosis not present

## 2019-10-21 DIAGNOSIS — I10 Essential (primary) hypertension: Secondary | ICD-10-CM | POA: Diagnosis not present

## 2019-10-21 MED ORDER — METOPROLOL SUCCINATE ER 100 MG PO TB24: 100.0000 mg | ORAL_TABLET | Freq: Every day | ORAL | 3 refills | Status: DC

## 2019-10-21 MED ORDER — NITROGLYCERIN 0.4 MG SL SUBL: 0.4000 mg | SUBLINGUAL_TABLET | SUBLINGUAL | 3 refills | Status: DC | PRN

## 2019-10-21 NOTE — Patient Instructions (Addendum)
Medication Instructions:  1) DISCONTINUE Metoprolol Tartrate 2) START Metoprolol Succinate 100mg  once daily  *If you need a refill on your cardiac medications before your next appointment, please call your pharmacy*   Lab Work: None If you have labs (blood work) drawn today and your tests are completely normal, you will receive your results only by: MyChart Message (if you have MyChart) OR . A paper copy in the mail If you have any lab test that is abnormal or we need to change your treatment, we will call you to review the results.   Testing/Procedures: None   Follow-Up: At [facility], you and your health needs are our priority.  As part of our continuing mission to provide you with exceptional heart care, we have created designated Provider Care Teams.  These Care Teams include your primary Cardiologist (physician) and Advanced Practice Providers (APPs -  Physician Assistants and Nurse Practitioners) who all work together to provide you with the care you need, when you need it.  We recommend signing up for the patient portal called "MyChart".  Sign up information is provided on this After Visit Summary.  MyChart is used to connect with patients for Virtual Visits (Telemedicine).  Patients are able to view lab/test results, encounter notes, upcoming appointments, etc.  Non-urgent messages can be sent to your provider as well.   To learn more about what you can do with MyChart, go to [facility].    Your next appointment:   12 month(s)  The format for your next appointment:   In Person  Provider:   You may see [email or web address], MD or one of the following Advanced Practice Providers on your designated Care Team:    Lesleigh Noe, NP  Norma Fredrickson, NP  Nada Boozer, NP    Other Instructions

## 2019-11-01 ENCOUNTER — Telehealth: Payer: Self-pay | Admitting: *Deleted

## 2019-11-01 NOTE — Telephone Encounter (Signed)
[id number]-Koltan inquiring about his mother in law jewel wilson who is in [facility] memory care.  Jewel has good and bad days but seems well cared for and happy. Wife Lupita Leash who has MS is doing good.  Has a slight cough and using o2 in the day and at night but is not running a fevers or sounds congested. Wishes for strethen and healing given to [place].  He will call if he needs any thing. Petros Ahart,BSN,RN3,CCM,CN

## 2019-11-07 ENCOUNTER — Ambulatory Visit (INDEPENDENT_AMBULATORY_CARE_PROVIDER_SITE_OTHER): Payer: Medicare PPO | Admitting: Orthotics

## 2019-11-07 ENCOUNTER — Other Ambulatory Visit: Payer: Self-pay

## 2019-11-07 DIAGNOSIS — E1140 Type 2 diabetes mellitus with diabetic neuropathy, unspecified: Secondary | ICD-10-CM | POA: Diagnosis not present

## 2019-11-07 DIAGNOSIS — M79674 Pain in right toe(s): Secondary | ICD-10-CM

## 2019-11-07 DIAGNOSIS — M79675 Pain in left toe(s): Secondary | ICD-10-CM | POA: Diagnosis not present

## 2019-11-07 NOTE — Progress Notes (Signed)
Repeat previous order, same specs.

## 2019-11-09 DIAGNOSIS — K9089 Other intestinal malabsorption: Secondary | ICD-10-CM | POA: Diagnosis not present

## 2019-11-09 DIAGNOSIS — Z79899 Other long term (current) drug therapy: Secondary | ICD-10-CM | POA: Diagnosis not present

## 2019-11-09 DIAGNOSIS — G4733 Obstructive sleep apnea (adult) (pediatric): Secondary | ICD-10-CM | POA: Diagnosis not present

## 2019-11-09 DIAGNOSIS — Z125 Encounter for screening for malignant neoplasm of prostate: Secondary | ICD-10-CM | POA: Diagnosis not present

## 2019-11-09 DIAGNOSIS — Z0000 Encounter for general adult medical examination without abnormal findings: Secondary | ICD-10-CM | POA: Diagnosis not present

## 2019-11-09 DIAGNOSIS — Z1389 Encounter for screening for other disorder: Secondary | ICD-10-CM | POA: Diagnosis not present

## 2019-11-09 DIAGNOSIS — E1142 Type 2 diabetes mellitus with diabetic polyneuropathy: Secondary | ICD-10-CM | POA: Diagnosis not present

## 2019-11-09 DIAGNOSIS — E7800 Pure hypercholesterolemia, unspecified: Secondary | ICD-10-CM | POA: Diagnosis not present

## 2019-11-09 DIAGNOSIS — I872 Venous insufficiency (chronic) (peripheral): Secondary | ICD-10-CM | POA: Diagnosis not present

## 2019-11-09 DIAGNOSIS — I10 Essential (primary) hypertension: Secondary | ICD-10-CM | POA: Diagnosis not present

## 2019-11-24 ENCOUNTER — Other Ambulatory Visit: Payer: Self-pay

## 2019-11-24 ENCOUNTER — Ambulatory Visit: Payer: Medicare PPO | Admitting: Podiatry

## 2019-11-24 DIAGNOSIS — M79675 Pain in left toe(s): Secondary | ICD-10-CM

## 2019-11-24 DIAGNOSIS — B351 Tinea unguium: Secondary | ICD-10-CM

## 2019-11-24 DIAGNOSIS — Q828 Other specified congenital malformations of skin: Secondary | ICD-10-CM

## 2019-11-24 DIAGNOSIS — E1140 Type 2 diabetes mellitus with diabetic neuropathy, unspecified: Secondary | ICD-10-CM | POA: Diagnosis not present

## 2019-11-24 DIAGNOSIS — M79674 Pain in right toe(s): Secondary | ICD-10-CM

## 2019-11-27 NOTE — Progress Notes (Signed)
Subjective: 64 y.o. returns the office today for painful, elongated, thickened toenails which he cannot trim himself.  Also presents to pick up orthotics.  Denies any redness or drainage around the nails.  Denies any systemic complaints such as fevers, chills, nausea, vomiting.   PCP: Merlene Laughter, MD  Objective: AAO 3, NAD; presents wearing regular shoes.  DP/PT pulses palpable, CRT less than 3 seconds Protective sensation decreased with Simms Weinstein monofilament Nails hypertrophic, dystrophic, elongated, brittle, discolored 10. There is tenderness overlying the nails 1-5 bilaterally. There is no surrounding erythema or drainage along the nail sites.   Hyperkeratotic lesions plantar medial midfoot without any underlying ulceration drainage or signs of infection. No open lesions or pre-ulcerative lesions are identified.  Flatfoot right > left with prominence of the talar head.  No area pinpoint bony tenderness or pain to vibratory sensation. No pain with calf compression, swelling, warmth, erythema.  Assessment: Patient presents with symptomatic onychomycosis; preulcerative calluses neuropathy  Plan: -Treatment options including alternatives, risks, complications were discussed -Nails sharply debrided 10 without complication/bleeding.   -Hyperkeratotic lesion sharply debrided x2 without complications or bleeding -Orthotics were dispensed today.  Oral and written for constructions were discussed. -Follow-up in 3 months or sooner if any problems are to arise. In the meantime, encouraged to call the office with any questions, concerns, changes symptoms.  Ovid Curd, DPM

## 2019-11-28 ENCOUNTER — Encounter: Payer: Medicare PPO | Admitting: Orthotics

## 2019-12-02 DIAGNOSIS — G4733 Obstructive sleep apnea (adult) (pediatric): Secondary | ICD-10-CM | POA: Diagnosis not present

## 2020-01-27 ENCOUNTER — Ambulatory Visit (INDEPENDENT_AMBULATORY_CARE_PROVIDER_SITE_OTHER): Payer: Medicare PPO

## 2020-01-27 ENCOUNTER — Ambulatory Visit: Payer: Medicare PPO | Admitting: Podiatry

## 2020-01-27 ENCOUNTER — Other Ambulatory Visit: Payer: Self-pay

## 2020-01-27 DIAGNOSIS — L84 Corns and callosities: Secondary | ICD-10-CM | POA: Diagnosis not present

## 2020-01-27 DIAGNOSIS — E1140 Type 2 diabetes mellitus with diabetic neuropathy, unspecified: Secondary | ICD-10-CM

## 2020-01-27 DIAGNOSIS — M19271 Secondary osteoarthritis, right ankle and foot: Secondary | ICD-10-CM | POA: Diagnosis not present

## 2020-01-27 DIAGNOSIS — M216X1 Other acquired deformities of right foot: Secondary | ICD-10-CM | POA: Diagnosis not present

## 2020-01-27 DIAGNOSIS — M2141 Flat foot [pes planus] (acquired), right foot: Secondary | ICD-10-CM | POA: Diagnosis not present

## 2020-01-27 DIAGNOSIS — M2142 Flat foot [pes planus] (acquired), left foot: Secondary | ICD-10-CM | POA: Diagnosis not present

## 2020-01-28 ENCOUNTER — Encounter: Payer: Self-pay | Admitting: Podiatry

## 2020-01-28 NOTE — Progress Notes (Signed)
  Subjective:  Patient ID: [name], male    DOB: 1955-12-12,  MRN: [id number]  Chief Complaint  Patient presents with  . Foot Pain    pt is here for a recheck of ulcer to the right foot.    64 y.o. male presents with the above complaint. History confirmed with patient.  He had orthotics made and adjusted to offload the area of pressure on the bilateral mid arch.  He feels like the orthotics are now starting to rub against this even more because a callus and a blister.  He has been wearing crocs instead of this.  Objective:  Physical Exam: warm, good capillary refill and normal DP and PT pulses.  Decrease sensation in the tips of toes but good sensation proximally along the midfoot and hindfoot.  Equinus is noted bilaterally.  He has severe pes planovalgus with complete collapse of the arch and preulcerative calluses bilaterally along the plantar medial talonavicular joint.  This is worse on the right foot, debridement of the hyperkeratosis over this reveals of the skin is intact.  Significant forefoot abduction.  Radiographs: X-ray of the right foot: Severe collapsing pes planovalgus with osteoarthritis throughout the midtarsal joint and hindfoot.  This is increased slightly since his last x-rays in 2018.  Severe declination of the talus with eversion of the calcaneus and significant forefoot abduction at the CCJ nd midtarsal joint Assessment:   1. Type 2 diabetes, controlled, with neuropathy (HCC)   2. Pes planus of both feet   3. Pre-ulcerative calluses   4. Equinus deformity of right foot   5. Other secondary osteoarthritis of right foot      Plan:  Patient was evaluated and treated and all questions answered.  -Discussed with him that the further collapse of his arch is preventing at risk for ulceration and we need to offload the area either with orthotics or bracing or consider surgical realignment.  -Added a PPT cut out to his orthotics and created a divot with a grinder.   Hopefully he will offload the talonavicular joint the prominent talar head.  He has a small area distal to this as well from the TMT joint and plantar medial cuneiform.  -We also discussed that if this continues and he ulcerates he is at risk for infection and limb loss due to his neuropathy and diabetes.  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  Do not think it is urgent currently, but I encouraged him to consider it in the next 1 to 3 years if possible in order to realign his foot and prevent further morbidity or risk of limb loss.  He will consider this.  Return in about 1 month (around 02/27/2020).

## 2020-02-09 DIAGNOSIS — Z23 Encounter for immunization: Secondary | ICD-10-CM | POA: Diagnosis not present

## 2020-02-17 ENCOUNTER — Encounter: Payer: Self-pay | Admitting: Podiatry

## 2020-02-20 ENCOUNTER — Ambulatory Visit: Payer: Medicare PPO | Admitting: Podiatry

## 2020-02-20 ENCOUNTER — Other Ambulatory Visit: Payer: Self-pay

## 2020-02-20 ENCOUNTER — Encounter: Payer: Self-pay | Admitting: Podiatry

## 2020-02-20 DIAGNOSIS — M19271 Secondary osteoarthritis, right ankle and foot: Secondary | ICD-10-CM

## 2020-02-20 DIAGNOSIS — M2142 Flat foot [pes planus] (acquired), left foot: Secondary | ICD-10-CM | POA: Diagnosis not present

## 2020-02-20 DIAGNOSIS — M216X1 Other acquired deformities of right foot: Secondary | ICD-10-CM

## 2020-02-20 DIAGNOSIS — E1140 Type 2 diabetes mellitus with diabetic neuropathy, unspecified: Secondary | ICD-10-CM

## 2020-02-20 DIAGNOSIS — L97411 Non-pressure chronic ulcer of right heel and midfoot limited to breakdown of skin: Secondary | ICD-10-CM

## 2020-02-20 DIAGNOSIS — M2141 Flat foot [pes planus] (acquired), right foot: Secondary | ICD-10-CM

## 2020-02-20 NOTE — Patient Instructions (Signed)
Monitor for any signs/symptoms of infection. Signs of an infection could be redness beyond the site of the incision/procedure/wound, foul smelling odor, drainage that is thick and yellow or green, or severe swelling and pain. Call the office immediately if any occur or go directly to the emergency room. Call with any questions/concerns.  

## 2020-02-21 ENCOUNTER — Other Ambulatory Visit: Payer: Self-pay | Admitting: Interventional Cardiology

## 2020-02-23 NOTE — Progress Notes (Signed)
  Subjective:  Patient ID: [name], male    DOB: 07/18/1955,  MRN: [id number]  Chief Complaint  Patient presents with  . Foot Ulcer    "I noticed some bleeding on my right foot Saturday when I was wearing tennis shoes"    64 y.o. male returns on an urgent basis with the above complaint. History confirmed with patient.  The area of callus in the right foot has now turned into a full blister with fluid draining from it.  Objective:  Physical Exam: warm, good capillary refill and normal DP and PT pulses.  Decrease sensation in the tips of toes but good sensation proximally along the midfoot and hindfoot.  Equinus is noted bilaterally.  He has severe pes planovalgus with complete collapse of the arch.  The medial callus under the talonavicular joint on the right foot has turned into a hemorrhagic blister with a partial thickness ulceration   Radiographs: X-ray of the right foot: Severe collapsing pes planovalgus with osteoarthritis throughout the midtarsal joint and hindfoot.  This is increased slightly since his last x-rays in 2018.  Severe declination of the talus with eversion of the calcaneus and significant forefoot abduction at the CCJ and midtarsal joint Assessment:   1. Type 2 diabetes, controlled, with neuropathy (HCC)   2. Pes planus of both feet   3. Ulcer of right midfoot limited to breakdown of skin (HCC)   4. Equinus deformity of right foot   5. Other secondary osteoarthritis of right foot      Plan:  Patient was evaluated and treated and all questions answered.  -Today he has new partial thickness ulceration under the callus overlying the talonavicular joint, I debrided the callus and the overlying skin and deroof the blister.  Applied a bandage with Iodosorb and he will apply bandage at home with antibiotic ointment.  Likely does not appear to penetrate dermis.  I had him evaluated by Ria Clock our pedorthist as well for an adjustment to his orthoses to offload the  area causing the pressure.  He has appointment Dr. Ardelle Anton in 1 week and he will reevaluate this at that time.  No follow-ups on file.

## 2020-02-27 ENCOUNTER — Ambulatory Visit: Payer: Medicare PPO | Admitting: Podiatry

## 2020-02-27 ENCOUNTER — Ambulatory Visit: Payer: Medicare PPO | Admitting: Orthotics

## 2020-02-27 ENCOUNTER — Other Ambulatory Visit: Payer: Self-pay

## 2020-02-27 DIAGNOSIS — M2142 Flat foot [pes planus] (acquired), left foot: Secondary | ICD-10-CM

## 2020-02-27 DIAGNOSIS — M2141 Flat foot [pes planus] (acquired), right foot: Secondary | ICD-10-CM | POA: Diagnosis not present

## 2020-02-27 DIAGNOSIS — L97411 Non-pressure chronic ulcer of right heel and midfoot limited to breakdown of skin: Secondary | ICD-10-CM

## 2020-02-27 DIAGNOSIS — E1140 Type 2 diabetes mellitus with diabetic neuropathy, unspecified: Secondary | ICD-10-CM

## 2020-02-27 MED ORDER — DOXYCYCLINE HYCLATE 100 MG PO TABS: 100.0000 mg | ORAL_TABLET | Freq: Two times a day (BID) | ORAL | 0 refills | Status: DC

## 2020-02-27 NOTE — Progress Notes (Signed)
Offloaded f/o at ulcer site.

## 2020-03-05 DIAGNOSIS — G4733 Obstructive sleep apnea (adult) (pediatric): Secondary | ICD-10-CM | POA: Diagnosis not present

## 2020-03-07 NOTE — Progress Notes (Signed)
Subjective: 64 year old male presents the office today for follow-up evaluation of ulcerations to his right foot.  He feels that it may have been coming from the orthotics rubbing and wearing shoes.  He states that the wounds are doing well denies any drainage or pus or any swelling or any redness to his foot.  No other concerns today. Denies any systemic complaints such as fevers, chills, nausea, vomiting. No acute changes since last appointment, and no other complaints at this time.   Objective: AAO x3, NAD DP/PT pulses palpable bilaterally, CRT less than 3 seconds There is hyperkeratotic preulcerative lesions present on the right foot talonavicular joint plantarly.  Upon debridement of the callus small pinpoint opening is still evident but there is no probing, undermining or tunneling.  There is no blister formation seen there is no edema, erythema, drainage or pus or any signs of infection noted today.   Significant flatfoot is evident which is unchanged when I last saw him.  There is no pain with calf compression, swelling, warmth, erythema  Assessment: Superficial wound right foot  Plan: -All treatment options discussed with the patient including all alternatives, risks, complications.  -Debrided hyperkeratotic lesion with any complications or bleeding to reveal the underlying ulceration.  Appears the wound is almost healed but still evident.  Continue antibiotic ointment dressing changes daily and offloading. -Is also been unhappy with the orthotic that there is been several modifications to it and the pain out-of-pocket for these.  Discussed with him once everything is healed we will likely do a new orthotic. -Patient encouraged to call the office with any questions, concerns, change in symptoms.   [name]

## 2020-03-12 ENCOUNTER — Encounter: Payer: Self-pay | Admitting: Podiatry

## 2020-03-13 ENCOUNTER — Ambulatory Visit: Payer: Medicare PPO | Admitting: Podiatry

## 2020-03-13 ENCOUNTER — Other Ambulatory Visit: Payer: Self-pay

## 2020-03-13 DIAGNOSIS — L97411 Non-pressure chronic ulcer of right heel and midfoot limited to breakdown of skin: Secondary | ICD-10-CM | POA: Diagnosis not present

## 2020-03-13 DIAGNOSIS — M2141 Flat foot [pes planus] (acquired), right foot: Secondary | ICD-10-CM

## 2020-03-13 DIAGNOSIS — E1140 Type 2 diabetes mellitus with diabetic neuropathy, unspecified: Secondary | ICD-10-CM

## 2020-03-13 DIAGNOSIS — M2142 Flat foot [pes planus] (acquired), left foot: Secondary | ICD-10-CM | POA: Diagnosis not present

## 2020-03-13 MED ORDER — PREGABALIN 150 MG PO CAPS: 150.0000 mg | ORAL_CAPSULE | Freq: Two times a day (BID) | ORAL | 5 refills | Status: DC

## 2020-03-13 NOTE — Progress Notes (Signed)
Subjective: 64 year old male presents the office today for follow-up evaluation of ulcerations to his right foot. He states he has been doing better. He has been continue with Medihoney on the wound. Denies any drainage or pus any swelling or redness. Has no other concerns today. Denies any systemic complaints such as fevers, chills, nausea, vomiting. No acute changes since last appointment, and no other complaints at this time.   Objective: AAO x3, NAD-presents today wearing a croc DP/PT pulses palpable bilaterally, CRT less than 3 seconds There is hyperkeratotic preulcerative lesions present on the right foot talonavicular joint plantarly. After debridement appears the wound is almost completely healed but is preulcerative. There is no probing, undermining or tunneling there is no surrounding erythema, ascending cellulitis. There is no fluctuation crepitation there is no malodor or warmth of the foot. Significant flatfoot is present. No pain with calf compression no erythema or warmth.  Assessment: Superficial wound right foot  Plan: -All treatment options discussed with the patient including all alternatives, risks, complications.  -Debrided hyperkeratotic tissue without any complications or bleeding. There is no wound is almost completely healed. Continue daily dressing changes and offloading.  [name]

## 2020-03-27 ENCOUNTER — Ambulatory Visit: Payer: Medicare PPO | Admitting: Podiatry

## 2020-03-27 ENCOUNTER — Other Ambulatory Visit: Payer: Self-pay

## 2020-03-27 DIAGNOSIS — M2141 Flat foot [pes planus] (acquired), right foot: Secondary | ICD-10-CM

## 2020-03-27 DIAGNOSIS — L97411 Non-pressure chronic ulcer of right heel and midfoot limited to breakdown of skin: Secondary | ICD-10-CM

## 2020-03-27 DIAGNOSIS — E1140 Type 2 diabetes mellitus with diabetic neuropathy, unspecified: Secondary | ICD-10-CM

## 2020-03-27 DIAGNOSIS — M2142 Flat foot [pes planus] (acquired), left foot: Secondary | ICD-10-CM

## 2020-03-29 NOTE — Progress Notes (Signed)
Subjective: 64 year old male presents the office today for follow-up evaluation of ulcerations to his right foot.  He states the wound is doing well he has not put a Band-Aid in the wound site is healed.  Not seen any drainage or swelling or redness.  No other concerns. Denies any systemic complaints such as fevers, chills, nausea, vomiting. No acute changes since last appointment, and no other complaints at this time.   Objective: AAO x3, NAD-presents today wearing a croc DP/PT pulses palpable bilaterally, CRT less than 3 seconds There is hyperkeratotic preulcerative lesions present on the right foot talonavicular joint plantarly.  There is no ulceration identified today appears of the wound is healed.  There is no surrounding erythema, ascending cellulitis peer there is no drainage of pus or any signs of infection noted. Significant flatfoot is present. No pain with calf compression no erythema or warmth.  Assessment: Preulcerative callus right foot  Plan: -All treatment options discussed with the patient including all alternatives, risks, complications.  -Debrided hyperkeratotic tissue without any complications or bleeding.  Ulceration appears to be healed.  Continue moisturizer to the area and offloading.  Monitor for any reoccurrence.     [name]

## 2020-04-17 ENCOUNTER — Ambulatory Visit: Payer: Medicare PPO | Admitting: Podiatry

## 2020-04-17 ENCOUNTER — Other Ambulatory Visit: Payer: Self-pay

## 2020-04-17 DIAGNOSIS — M2142 Flat foot [pes planus] (acquired), left foot: Secondary | ICD-10-CM | POA: Diagnosis not present

## 2020-04-17 DIAGNOSIS — L84 Corns and callosities: Secondary | ICD-10-CM | POA: Diagnosis not present

## 2020-04-17 DIAGNOSIS — M2141 Flat foot [pes planus] (acquired), right foot: Secondary | ICD-10-CM | POA: Diagnosis not present

## 2020-04-17 DIAGNOSIS — E1140 Type 2 diabetes mellitus with diabetic neuropathy, unspecified: Secondary | ICD-10-CM | POA: Diagnosis not present

## 2020-04-18 NOTE — Progress Notes (Signed)
Subjective: 65 year old male presents the office today for follow-up evaluation of ulceration to his right foot.  States he has been doing much better he is not seeing any open sore, drainage or swelling.  He has not had any changes of since I last saw him. Denies any systemic complaints such as fevers, chills, nausea, vomiting. No acute changes since last appointment, and no other complaints at this time.   Objective: AAO x3, NAD-presents today wearing a croc DP/PT pulses palpable bilaterally, CRT less than 3 seconds There is hyperkeratotic preulcerative lesions present on the right foot talonavicular joint plantarly.  There is no underlying ulceration drainage or any signs of infection noted today.   Significant flatfoot is present. No pain with calf compression no erythema or warmth.  Assessment: Preulcerative callus right foot  Plan: -All treatment options discussed with the patient including all alternatives, risks, complications.  -Debrided hyperkeratotic tissue without any complications or bleeding.  Ulceration appears to be healed.  Continue moisturizer to the area and offloading.  Monitor for any reoccurrence.     [name]

## 2020-05-04 DIAGNOSIS — E119 Type 2 diabetes mellitus without complications: Secondary | ICD-10-CM | POA: Diagnosis not present

## 2020-05-04 DIAGNOSIS — H2513 Age-related nuclear cataract, bilateral: Secondary | ICD-10-CM | POA: Diagnosis not present

## 2020-05-04 DIAGNOSIS — H524 Presbyopia: Secondary | ICD-10-CM | POA: Diagnosis not present

## 2020-05-09 DIAGNOSIS — E7800 Pure hypercholesterolemia, unspecified: Secondary | ICD-10-CM | POA: Diagnosis not present

## 2020-05-09 DIAGNOSIS — Z6841 Body Mass Index (BMI) 40.0 and over, adult: Secondary | ICD-10-CM | POA: Diagnosis not present

## 2020-05-09 DIAGNOSIS — I25119 Atherosclerotic heart disease of native coronary artery with unspecified angina pectoris: Secondary | ICD-10-CM | POA: Diagnosis not present

## 2020-05-09 DIAGNOSIS — E1142 Type 2 diabetes mellitus with diabetic polyneuropathy: Secondary | ICD-10-CM | POA: Diagnosis not present

## 2020-05-09 DIAGNOSIS — I10 Essential (primary) hypertension: Secondary | ICD-10-CM | POA: Diagnosis not present

## 2020-05-09 DIAGNOSIS — Z7984 Long term (current) use of oral hypoglycemic drugs: Secondary | ICD-10-CM | POA: Diagnosis not present

## 2020-05-09 DIAGNOSIS — Z79899 Other long term (current) drug therapy: Secondary | ICD-10-CM | POA: Diagnosis not present

## 2020-05-28 ENCOUNTER — Other Ambulatory Visit: Payer: Self-pay

## 2020-05-28 ENCOUNTER — Ambulatory Visit: Payer: Medicare PPO | Admitting: Podiatry

## 2020-05-28 DIAGNOSIS — M79674 Pain in right toe(s): Secondary | ICD-10-CM | POA: Diagnosis not present

## 2020-05-28 DIAGNOSIS — M2142 Flat foot [pes planus] (acquired), left foot: Secondary | ICD-10-CM

## 2020-05-28 DIAGNOSIS — E1140 Type 2 diabetes mellitus with diabetic neuropathy, unspecified: Secondary | ICD-10-CM

## 2020-05-28 DIAGNOSIS — M79675 Pain in left toe(s): Secondary | ICD-10-CM

## 2020-05-28 DIAGNOSIS — M2141 Flat foot [pes planus] (acquired), right foot: Secondary | ICD-10-CM

## 2020-05-28 DIAGNOSIS — B351 Tinea unguium: Secondary | ICD-10-CM | POA: Diagnosis not present

## 2020-05-30 NOTE — Progress Notes (Signed)
Subjective: 65 y.o. returns the office today for painful, elongated, thickened toenails which he cannot trim himself.  He is seeing recurrence of the new wounds.  Still some callus on the plantar medial aspect of the foot but denies any swelling or redness or drainage.  He feels that the orthotic particularly on the left side needs to be adjusted.  He does not have them with him today. Denies any systemic complaints such as fevers, chills, nausea, vomiting.   PCP: Merlene Laughter, MD  Objective: AAO 3, NAD; presents wearing regular shoes.  DP/PT pulses palpable, CRT less than 3 seconds Protective sensation decreased with Simms Weinstein monofilament Nails hypertrophic, dystrophic, elongated, brittle, discolored 10. There is tenderness overlying the nails 1-5 bilaterally. There is no surrounding erythema or drainage along the nail sites.  Flatfoot right > left with prominence of the talar head.  No area pinpoint bony tenderness or pain to vibratory sensation.  No open lesions. No pain with calf compression, swelling, warmth, erythema.  Assessment: Patient presents with symptomatic onychomycosis; neuropathy  Plan: -Treatment options including alternatives, risks, complications were discussed -Nails sharply debrided 10 without complication/bleeding.  -No significant ulcerations identified.  There is very minimal hyperkeratotic tissue today.  No signs of infection.  Seen by the orthotist actually can see about modifications. -Discussed daily foot inspection. If there are any changes, to call the office immediately.  -Follow-up in 3 months or sooner if any problems are to arise. In the meantime, encouraged to call the office with any questions, concerns, changes symptoms.  Ovid Curd, DPM

## 2020-06-12 ENCOUNTER — Other Ambulatory Visit: Payer: Self-pay | Admitting: Podiatry

## 2020-06-12 MED ORDER — PREGABALIN 150 MG PO CAPS: 150.0000 mg | ORAL_CAPSULE | Freq: Two times a day (BID) | ORAL | 5 refills | Status: DC

## 2020-06-13 ENCOUNTER — Telehealth: Payer: Self-pay | Admitting: *Deleted

## 2020-06-13 ENCOUNTER — Other Ambulatory Visit: Payer: Self-pay | Admitting: Podiatry

## 2020-06-13 MED ORDER — PREGABALIN 150 MG PO CAPS: 150.0000 mg | ORAL_CAPSULE | Freq: Two times a day (BID) | ORAL | 5 refills | Status: DC

## 2020-06-13 NOTE — Progress Notes (Signed)
Refilled lyrica and sent to new pharmacy

## 2020-06-13 NOTE — Telephone Encounter (Signed)
Patient is requesting that his prescription (Pregabalin-150 mg) be cancelled at the [facility] on [address] (system is down) and be resent to [facility] [employer].He is currently completely out of medicine.

## 2020-06-18 ENCOUNTER — Other Ambulatory Visit: Payer: Self-pay

## 2020-06-18 ENCOUNTER — Other Ambulatory Visit: Payer: Medicare PPO

## 2020-06-18 ENCOUNTER — Encounter: Payer: Self-pay | Admitting: Podiatry

## 2020-06-18 ENCOUNTER — Ambulatory Visit (INDEPENDENT_AMBULATORY_CARE_PROVIDER_SITE_OTHER): Payer: Medicare PPO | Admitting: Podiatry

## 2020-06-18 DIAGNOSIS — M19271 Secondary osteoarthritis, right ankle and foot: Secondary | ICD-10-CM

## 2020-06-18 DIAGNOSIS — M2141 Flat foot [pes planus] (acquired), right foot: Secondary | ICD-10-CM

## 2020-06-18 DIAGNOSIS — K219 Gastro-esophageal reflux disease without esophagitis: Secondary | ICD-10-CM | POA: Insufficient documentation

## 2020-06-18 DIAGNOSIS — M2142 Flat foot [pes planus] (acquired), left foot: Secondary | ICD-10-CM

## 2020-06-18 DIAGNOSIS — E1140 Type 2 diabetes mellitus with diabetic neuropathy, unspecified: Secondary | ICD-10-CM

## 2020-06-18 DIAGNOSIS — I872 Venous insufficiency (chronic) (peripheral): Secondary | ICD-10-CM | POA: Insufficient documentation

## 2020-06-18 DIAGNOSIS — I209 Angina pectoris, unspecified: Secondary | ICD-10-CM | POA: Insufficient documentation

## 2020-06-18 DIAGNOSIS — G629 Polyneuropathy, unspecified: Secondary | ICD-10-CM | POA: Insufficient documentation

## 2020-06-18 DIAGNOSIS — E1142 Type 2 diabetes mellitus with diabetic polyneuropathy: Secondary | ICD-10-CM | POA: Insufficient documentation

## 2020-06-18 NOTE — Progress Notes (Signed)
Patient presents today to be re-casted for orthotics.  A foam impression was casted for his right and left foot.  Patient is a size 10 1/2   Patient has had some inserts made previously and still do not contour to patient's feet and patient stated that they are hurting and per Dr Ardelle Anton, we had patient to come in and get re-casted today.  Patient will be contracted when the inserts come back in.

## 2020-07-11 ENCOUNTER — Telehealth: Payer: Self-pay

## 2020-07-11 NOTE — Telephone Encounter (Signed)
Error

## 2020-08-08 ENCOUNTER — Encounter: Payer: Self-pay | Admitting: Podiatry

## 2020-08-09 ENCOUNTER — Other Ambulatory Visit: Payer: Self-pay | Admitting: Podiatry

## 2020-08-09 ENCOUNTER — Telehealth: Payer: Self-pay | Admitting: Podiatry

## 2020-08-09 MED ORDER — CELECOXIB 200 MG PO CAPS: 200.0000 mg | ORAL_CAPSULE | Freq: Every day | ORAL | 1 refills | Status: DC

## 2020-08-09 NOTE — Telephone Encounter (Signed)
Pt called stating the current orthotics are working well for him. I apologized that [place] misunderstood and just corrected the ones they received that were working for him and I would order him a new pair like the ones that were just sent to [place]. Dr Ardelle Anton agreed and I will call pt when they come in.

## 2020-08-15 ENCOUNTER — Ambulatory Visit: Payer: Medicare PPO | Admitting: Orthopaedic Surgery

## 2020-08-15 ENCOUNTER — Other Ambulatory Visit: Payer: Self-pay

## 2020-08-15 ENCOUNTER — Ambulatory Visit (INDEPENDENT_AMBULATORY_CARE_PROVIDER_SITE_OTHER): Payer: Medicare PPO

## 2020-08-15 ENCOUNTER — Encounter: Payer: Self-pay | Admitting: Orthopaedic Surgery

## 2020-08-15 DIAGNOSIS — Z96652 Presence of left artificial knee joint: Secondary | ICD-10-CM | POA: Diagnosis not present

## 2020-08-15 NOTE — Progress Notes (Signed)
Office Visit Note   Patient: [name]           Date of Birth: 07-Nov-1955           MRN: [id number] Visit Date: 08/15/2020              Requested by: Merlene Laughter, MD [address] [place],  [place] [address] PCP: Merlene Laughter, MD   Assessment & Plan: Visit Diagnoses:  1. S/P TKR (total knee replacement), left     Plan: Clinically he is doing well and he is pleased with the knee replacement.  The lucency between the cement mantle and the medial tibial plateau appears to be stable and unchanged.  He is not reporting any symptoms of loosening.  Therefore at this time we will have him follow-up as needed.  He can always come back or message me if he has any problems.  Follow-Up Instructions: Return if symptoms worsen or fail to improve.   Orders:  Orders Placed This Encounter  Procedures  . XR Knee 1-2 Views Left   No orders of the defined types were placed in this encounter.     Procedures: No procedures performed   Clinical Data: No additional findings.   Subjective: Chief Complaint  Patient presents with  . Left Knee - Pain    [name] is following up for his yearly checkup for left total knee replacement that was done on 01/29/2018.  He reports no problems.  He is able to do all daily activities without problems.  No complaints.   Review of Systems   Objective: Vital Signs: There were no vitals taken for this visit.  Physical Exam  Ortho Exam Left knee shows a fully healed surgical scar.  Mild swelling around the knee.  No joint effusion.  Range of motion 0 to 100 degrees.  Stable to varus valgus. Specialty Comments:  No specialty comments available.  Imaging: XR Knee 1-2 Views Left  Result Date: 08/15/2020 Stable total knee replacement in good alignment.     PMFS History: Patient Active Problem List   Diagnosis Date Noted  . Angina pectoris (HCC) 06/18/2020  . Gastroesophageal reflux disease 06/18/2020  . Peripheral  neuropathy 06/18/2020  . Peripheral venous insufficiency 06/18/2020  . Polyneuropathy due to type 2 diabetes mellitus (HCC) 06/18/2020  . Body mass index 40.0-44.9, adult (HCC) 02/16/2018  . Primary osteoarthritis of left knee 01/29/2018  . S/P TKR (total knee replacement), left 01/29/2018  . Primary gout 01/11/2014  . CAD (coronary artery disease), native coronary artery 07/26/2013  . Essential hypertension 07/26/2013  . Hyperlipidemia 07/26/2013  . Morbid obesity (HCC) 07/26/2013  . Obstructive sleep apnea 07/26/2013   Past Medical History:  Diagnosis Date  . Arthritis    "ankles, knees" (01/29/2018)  . CAD in native artery    cath report from 02/2017 indicates patient had a history of RCA stent >10 years prior to that. At time of that cath he was found to have EF 70%, 20-30% ostial LAD, 30-40% prox LAD, 30-40% Cx, 80-90% Cx beyond OM1, totally occluded RCA with left-to-right collaterals.  . Diabetes mellitus type 2 in obese (HCC)   . GERD (gastroesophageal reflux disease)   . Hyperlipidemia   . Hypertension   . Morbid obesity (HCC)   . OSA on CPAP     Family History  Problem Relation Age of Onset  . Heart attack Mother   . Heart attack Father     Past Surgical History:  Procedure Laterality Date  . CARDIAC CATHETERIZATION  2008  . CORONARY ANGIOPLASTY WITH STENT PLACEMENT  1997  . JOINT REPLACEMENT    . KNEE ARTHROSCOPY Bilateral    "meniscus repairs"  . TONSILLECTOMY    . TOTAL KNEE ARTHROPLASTY Left 01/29/2018  . TOTAL KNEE ARTHROPLASTY Left 01/29/2018   Procedure: LEFT TOTAL KNEE ARTHROPLASTY;  Surgeon: Tarry Kos, MD;  Location: [facility] OR;  Service: Orthopedics;  Laterality: Left;  . UVULOPALATOPHARYNGOPLASTY  ~ 1994   Social History   Occupational History  . Not on file  Tobacco Use  . Smoking status: Never Smoker  . Smokeless tobacco: Never Used  Vaping Use  . Vaping Use: Never used  Substance and Sexual Activity  . Alcohol use: Yes    Comment:  01/29/2018 "may have 1 beer/month; if that"  . Drug use: Never  . Sexual activity: Not Currently

## 2020-08-27 ENCOUNTER — Encounter: Payer: Self-pay | Admitting: Podiatry

## 2020-08-27 ENCOUNTER — Other Ambulatory Visit: Payer: Self-pay

## 2020-08-27 ENCOUNTER — Ambulatory Visit: Payer: Medicare PPO | Admitting: Podiatry

## 2020-08-27 DIAGNOSIS — M79675 Pain in left toe(s): Secondary | ICD-10-CM | POA: Diagnosis not present

## 2020-08-27 DIAGNOSIS — M79674 Pain in right toe(s): Secondary | ICD-10-CM

## 2020-08-27 DIAGNOSIS — B351 Tinea unguium: Secondary | ICD-10-CM | POA: Diagnosis not present

## 2020-08-27 DIAGNOSIS — E1140 Type 2 diabetes mellitus with diabetic neuropathy, unspecified: Secondary | ICD-10-CM

## 2020-08-29 NOTE — Progress Notes (Signed)
Subjective: 65 y.o. returns the office today for painful, elongated, thickened toenails which he cannot trim himself.  States the calluses been doing better the arch of his foot as he has been keeping them moisturized.  He also presents today to get his new adjusted orthotics.  He does take anti-inflammatories regularly and he states that he discussed this with his primary care physician has kidney function checked regularly.  Denies any systemic complaints such as fevers, chills, nausea, vomiting.   PCP: Merlene Laughter, MD  Objective: AAO 3, NAD; presents wearing regular shoes.  DP/PT pulses palpable, CRT less than 3 seconds Protective sensation decreased with Simms Weinstein monofilament Nails hypertrophic, dystrophic, elongated, brittle, discolored 10. There is tenderness overlying the nails 1-5 bilaterally. There is no surrounding erythema or drainage along the nail sites.  Flatfoot right > left with prominence of the talar head.  No area pinpoint bony tenderness or pain to vibratory sensation.  No open lesions. No pain with calf compression, swelling, warmth, erythema.  Assessment: Patient presents with symptomatic onychomycosis; neuropathy  Plan: -Treatment options including alternatives, risks, complications were discussed -Nails sharply debrided 10 without complication/bleeding.  -New inserts were dispensed and oral and written break-in instructions provided. -Discussed daily foot inspection. If there are any changes, to call the office immediately.  -Follow-up in 3 months or sooner if any problems are to arise. In the meantime, encouraged to call the office with any questions, concerns, changes symptoms.  Ovid Curd, DPM

## 2020-10-03 ENCOUNTER — Other Ambulatory Visit: Payer: Self-pay | Admitting: Podiatry

## 2020-10-03 NOTE — Telephone Encounter (Signed)
Please advise 

## 2020-10-06 ENCOUNTER — Other Ambulatory Visit: Payer: Self-pay | Admitting: Interventional Cardiology

## 2020-10-08 NOTE — Telephone Encounter (Signed)
Rx(s) sent to pharmacy electronically.  

## 2020-10-29 ENCOUNTER — Other Ambulatory Visit: Payer: Self-pay | Admitting: Interventional Cardiology

## 2020-11-01 DIAGNOSIS — G4733 Obstructive sleep apnea (adult) (pediatric): Secondary | ICD-10-CM | POA: Diagnosis not present

## 2020-11-27 ENCOUNTER — Ambulatory Visit: Payer: Medicare PPO | Admitting: Podiatry

## 2020-11-27 ENCOUNTER — Other Ambulatory Visit: Payer: Self-pay

## 2020-11-27 DIAGNOSIS — E1140 Type 2 diabetes mellitus with diabetic neuropathy, unspecified: Secondary | ICD-10-CM | POA: Diagnosis not present

## 2020-11-27 DIAGNOSIS — M79675 Pain in left toe(s): Secondary | ICD-10-CM | POA: Diagnosis not present

## 2020-11-27 DIAGNOSIS — B351 Tinea unguium: Secondary | ICD-10-CM

## 2020-11-27 DIAGNOSIS — M79674 Pain in right toe(s): Secondary | ICD-10-CM | POA: Diagnosis not present

## 2020-11-29 DIAGNOSIS — K9089 Other intestinal malabsorption: Secondary | ICD-10-CM | POA: Diagnosis not present

## 2020-11-29 DIAGNOSIS — I10 Essential (primary) hypertension: Secondary | ICD-10-CM | POA: Diagnosis not present

## 2020-11-29 DIAGNOSIS — Z0000 Encounter for general adult medical examination without abnormal findings: Secondary | ICD-10-CM | POA: Diagnosis not present

## 2020-11-29 DIAGNOSIS — M14671 Charcot's joint, right ankle and foot: Secondary | ICD-10-CM | POA: Diagnosis not present

## 2020-11-29 DIAGNOSIS — E1142 Type 2 diabetes mellitus with diabetic polyneuropathy: Secondary | ICD-10-CM | POA: Diagnosis not present

## 2020-11-29 DIAGNOSIS — G4733 Obstructive sleep apnea (adult) (pediatric): Secondary | ICD-10-CM | POA: Diagnosis not present

## 2020-11-29 DIAGNOSIS — Z1159 Encounter for screening for other viral diseases: Secondary | ICD-10-CM | POA: Diagnosis not present

## 2020-11-29 DIAGNOSIS — E7800 Pure hypercholesterolemia, unspecified: Secondary | ICD-10-CM | POA: Diagnosis not present

## 2020-11-29 DIAGNOSIS — Z1389 Encounter for screening for other disorder: Secondary | ICD-10-CM | POA: Diagnosis not present

## 2020-11-29 DIAGNOSIS — Z79899 Other long term (current) drug therapy: Secondary | ICD-10-CM | POA: Diagnosis not present

## 2020-11-29 DIAGNOSIS — M14672 Charcot's joint, left ankle and foot: Secondary | ICD-10-CM | POA: Diagnosis not present

## 2020-11-29 DIAGNOSIS — Z125 Encounter for screening for malignant neoplasm of prostate: Secondary | ICD-10-CM | POA: Diagnosis not present

## 2020-11-29 DIAGNOSIS — I25119 Atherosclerotic heart disease of native coronary artery with unspecified angina pectoris: Secondary | ICD-10-CM | POA: Diagnosis not present

## 2020-12-01 ENCOUNTER — Other Ambulatory Visit: Payer: Self-pay | Admitting: Podiatry

## 2020-12-02 NOTE — Progress Notes (Signed)
Subjective: 65 y.o. returns the office today for painful, elongated, thickened toenails which he cannot trim himself.  Denies any ulcerations.  Calluses been doing well.  Denies any systemic complaints such as fevers, chills, nausea, vomiting.   PCP: Merlene Laughter, MD  Objective: AAO 3, NAD; presents wearing regular shoes.  DP/PT pulses palpable, CRT less than 3 seconds Protective sensation decreased with Simms Weinstein monofilament Nails hypertrophic, dystrophic, elongated, brittle, discolored 10. There is tenderness overlying the nails 1-5 bilaterally. There is no surrounding erythema or drainage along the nail sites.  Flatfoot right > left with prominence of the talar head.  No area pinpoint bony tenderness or pain to vibratory sensation.  No open lesions. No pain with calf compression, swelling, warmth, erythema.  Assessment: Patient presents with symptomatic onychomycosis; neuropathy  Plan: -Treatment options including alternatives, risks, complications were discussed -Nails sharply debrided 10 without complication/bleeding.  -Continue inserts to help with the flatfoot as well as supportive shoe gear.  Continue moisturizer. -Discussed daily foot inspection. If there are any changes, to call the office immediately.  -Follow-up in 3 months or sooner if any problems are to arise. In the meantime, encouraged to call the office with any questions, concerns, changes symptoms.  Ovid Curd, DPM

## 2020-12-06 NOTE — Progress Notes (Signed)
Cardiology Office Note:    Date:  12/07/2020   ID:  [name], DOB 1956-02-20, MRN [id number]  PCP:  Merlene Laughter, MD  Cardiologist:  Lesleigh Noe, MD   Referring MD: Merlene Laughter, MD   Chief Complaint  Patient presents with   Coronary Artery Disease     History of Present Illness:    [name] is a 65 y.o. male with a hx of hypertension, sleep apnea (uses CPAP), morbid obesity, DM, and hyperlipidemia, CAD with prior RCA STENT (2008 cath: EF 70%, 20-30% ostial LAD, 30-40% prox LAD, 30-40% Cx, 80-90% Cx beyond OM1, totally occluded RCA with left-to-right collaterals). This was treated medically.    He is noticing more frequent angina.  He feels this is because he is having to do more physical activity as his wife is completely bedridden now.  He notices angina with things such as pushing her wheelchair, doing much walking, and other physically challenging activities.  He does not use nitroglycerin.  He simply stops and the discomfort resolves.  He is known to have a totally occluded right coronary with moderate disease in other territories.  No recent nuclear imaging or functional testing.  Past Medical History:  Diagnosis Date   Arthritis    "ankles, knees" (01/29/2018)   CAD in native artery    cath report from 02/2017 indicates patient had a history of RCA stent >10 years prior to that. At time of that cath he was found to have EF 70%, 20-30% ostial LAD, 30-40% prox LAD, 30-40% Cx, 80-90% Cx beyond OM1, totally occluded RCA with left-to-right collaterals.   Diabetes mellitus type 2 in obese (HCC)    GERD (gastroesophageal reflux disease)    Hyperlipidemia    Hypertension    Morbid obesity (HCC)    OSA on CPAP     Past Surgical History:  Procedure Laterality Date   CARDIAC CATHETERIZATION  2008   CORONARY ANGIOPLASTY WITH STENT PLACEMENT  1997   JOINT REPLACEMENT     KNEE ARTHROSCOPY Bilateral    "meniscus repairs"   TONSILLECTOMY     TOTAL KNEE  ARTHROPLASTY Left 01/29/2018   TOTAL KNEE ARTHROPLASTY Left 01/29/2018   Procedure: LEFT TOTAL KNEE ARTHROPLASTY;  Surgeon: Tarry Kos, MD;  Location: [facility] OR;  Service: Orthopedics;  Laterality: Left;   UVULOPALATOPHARYNGOPLASTY  ~ 1994    Current Medications: Current Meds  Medication Sig   acetaminophen (TYLENOL) 650 MG CR tablet Take 1,300 mg by mouth at bedtime.   aspirin EC 325 MG tablet Take 325 mg by mouth daily.   atorvastatin (LIPITOR) 80 MG tablet Take 80 mg by mouth at bedtime.    celecoxib (CELEBREX) 200 MG capsule TAKE 1 CAPSULE BY MOUTH EVERY DAY   Cholecalciferol (VITAMIN D3) 2000 units TABS Take 2,000 Units by mouth daily.   empagliflozin (JARDIANCE) 25 MG TABS tablet Take 25 mg by mouth daily.   glimepiride (AMARYL) 2 MG tablet Take 2 mg by mouth daily.   Glucosamine-Chondroitin (COSAMIN DS PO) Take 2 tablets by mouth daily with breakfast.    isosorbide mononitrate (IMDUR) 120 MG 24 hr tablet TAKE 1 TABLET BY MOUTH EVERY DAY   ketoconazole (NIZORAL) 2 % cream    lisinopril (PRINIVIL,ZESTRIL) 5 MG tablet Take 5 mg by mouth at bedtime.    Menthol, Topical Analgesic, 4 % GEL Apply 1 application topically See admin instructions. Apply to both feet at bedtime for neuropathy   metoprolol succinate (TOPROL-XL) 100 MG 24 hr  tablet TAKE 1 TABLET BY MOUTH EVERY DAY WITH OR IMMEDIATELY FOLLOWING A MEAL   Multiple Vitamin (MULTIVITAMIN) tablet Take 1 tablet by mouth daily.   NON FORMULARY [facility]  Peripheral Neuropathy Cream- Bupivacaine 1%, Doxepin 3%, Gabapentin 6%, Pentoxifylline 3%, Topiramate 1% Apply 1-2 grams to affected area 3-4 times daily Qty. 120 gm 3 refills   omeprazole (PRILOSEC) 20 MG capsule Take 20 mg by mouth daily.    pregabalin (LYRICA) 150 MG capsule Take 1 capsule (150 mg total) by mouth 2 (two) times daily.   senna-docusate (SENOKOT S) 8.6-50 MG tablet Take 1 tablet by mouth at bedtime as needed.   tamsulosin (FLOMAX) 0.4 MG CAPS capsule     Tetrahydrozoline HCl (VISINE OP) Place 1 drop into both eyes daily as needed (for dry or irritated eyes).    triamterene-hydrochlorothiazide (MAXZIDE) 75-50 MG per tablet Take 0.5 tablets by mouth daily.    WELCHOL 625 MG tablet Take 1,875 mg by mouth at bedtime.    [DISCONTINUED] nitroGLYCERIN (NITROSTAT) 0.4 MG SL tablet Place 1 tablet (0.4 mg total) under the tongue every 5 (five) minutes x 3 doses as needed for chest pain.     Allergies:   Metformin and Penicillins   Social History   Socioeconomic History   Marital status: Married    Spouse name: Not on file   Number of children: Not on file   Years of education: Not on file   Highest education level: Not on file  Occupational History   Not on file  Tobacco Use   Smoking status: Never   Smokeless tobacco: Never  Vaping Use   Vaping Use: Never used  Substance and Sexual Activity   Alcohol use: Yes    Comment: 01/29/2018 "may have 1 beer/month; if that"   Drug use: Never   Sexual activity: Not Currently  Other Topics Concern   Not on file  Social History Narrative   Not on file   Social Determinants of Health   Financial Resource Strain: Not on file  Food Insecurity: Not on file  Transportation Needs: Not on file  Physical Activity: Not on file  Stress: Not on file  Social Connections: Not on file     Family History: The patient's family history includes Heart attack in his father and mother.  ROS:   Please see the history of present illness.    Does not feel he can walk on a treadmill.  Sleeps okay.  Wears CPAP.  All other systems reviewed and are negative.  EKGs/Labs/Other Studies Reviewed:    The following studies were reviewed today: Last functional ischemic assessment was September 2019. Study Highlights  Nuclear stress EF: 62%. There was no ST segment deviation noted during stress. There is a medium defect of moderate severity present in the basal inferior, mid inferior and apical inferior location.  The defect is non-reversible and in the setting of normal LVF this is consistent with diaphragmatic attenuation artifact. No ischemia noted. The left ventricular ejection fraction is normal (55-65%). This is a low risk study.  EKG:  EKG sinus rhythm, left axis deviation, otherwise unremarkable  and unchanged when compared to prior  Recent Labs: No results found for requested labs within last 8760 hours.  Recent Lipid Panel    Component Value Date/Time   CHOL  03/04/2007 0530    165        ATP III CLASSIFICATION:  <200     mg/dL   Desirable  [id number]  mg/dL  Borderline High  >=240    mg/dL   High   TRIG 893 (H) 81/04/7508 0530   HDL 30 (L) 03/04/2007 0530   CHOLHDL 5.5 03/04/2007 0530   VLDL 74 (H) 03/04/2007 0530   LDLCALC  03/04/2007 0530    61        Total Cholesterol/HDL:CHD Risk Coronary Heart Disease Risk Table                     Men   Women  1/2 Average Risk   3.4   3.3    Physical Exam:    VS:  BP 130/80   Pulse (!) 59   Ht 5\' 8"  (1.727 m)   Wt 267 lb 3.2 oz (121.2 kg)   SpO2 95%   BMI 40.63 kg/m     Wt Readings from Last 3 Encounters:  12/07/20 267 lb 3.2 oz (121.2 kg)  10/21/19 281 lb 3.2 oz (127.6 kg)  08/26/18 284 lb 8 oz (129 kg)     GEN: Morbid obesity. No acute distress HEENT: Normal NECK: No JVD. LYMPHATICS: No lymphadenopathy CARDIAC: No murmur. RRR no gallop, or edema. VASCULAR:  Normal Pulses. No bruits. RESPIRATORY:  Clear to auscultation without rales, wheezing or rhonchi  ABDOMEN: Soft, non-tender, non-distended, No pulsatile mass, MUSCULOSKELETAL: No deformity  SKIN: Warm and dry NEUROLOGIC:  Alert and oriented x 3 PSYCHIATRIC:  Normal affect   ASSESSMENT:    1. Coronary artery disease involving native coronary artery of native heart without angina pectoris   2. Essential hypertension   3. Mixed hyperlipidemia   4. OSA on CPAP   5. Morbid obesity (HCC)   6. Type 2 diabetes mellitus with other circulatory complication, without  long-term current use of insulin (HCC)   7. Angina pectoris (HCC)    PLAN:    In order of problems listed above:  Secondary prevention discussed.  With increasing angina, we will do a Lexiscan 2-day myocardial perfusion study.  We have a relatively recent study with which to compare. Blood pressure control is excellent on current therapy.  No changes needed at their.  Continue Zestril, Toprol-XL, and Maxide. Continue high intensity statin therapy with Lipitor 80 mg/day. Continue CPAP Weight loss with decrease caloric intake and exercise as tolerated Diabetes out-of-control, A1c is 8.3.  On Jardiance 25 mg/day and Glucophage was switched to glimepiride 20 mg/day.  Overall education and awareness concerning primary/secondary risk prevention was discussed in detail: LDL less than 70, hemoglobin A1c less than 7, blood pressure target less than 130/80 mmHg, >150 minutes of moderate aerobic activity per week, avoidance of smoking, weight control (via diet and exercise), and continued surveillance/management of/for obstructive sleep apnea.  1 year follow-up unless nuclear study shows significant variance compared to prior.   Medication Adjustments/Labs and Tests Ordered: Current medicines are reviewed at length with the patient today.  Concerns regarding medicines are outlined above.  Orders Placed This Encounter  Procedures   MYOCARDIAL PERFUSION IMAGING   EKG 12-Lead    Meds ordered this encounter  Medications   nitroGLYCERIN (NITROSTAT) 0.4 MG SL tablet    Sig: Place 1 tablet (0.4 mg total) under the tongue every 5 (five) minutes x 3 doses as needed for chest pain.    Dispense:  25 tablet    Refill:  3     Patient Instructions  Medication Instructions:  Your physician recommends that you continue on your current medications as directed. Please refer to the Current Medication list given  to you today.  *If you need a refill on your cardiac medications before your next appointment,  please call your pharmacy*   Lab Work: None If you have labs (blood work) drawn today and your tests are completely normal, you will receive your results only by: MyChart Message (if you have MyChart) OR A paper copy in the mail If you have any lab test that is abnormal or we need to change your treatment, we will call you to review the results.   Testing/Procedures: Your physician has requested that you have a lexiscan myoview. For further information please visit [email or web address] Please follow instruction sheet, as given.    Follow-Up: At [facility], you and your health needs are our priority.  As part of our continuing mission to provide you with exceptional heart care, we have created designated Provider Care Teams.  These Care Teams include your primary Cardiologist (physician) and Advanced Practice Providers (APPs -  Physician Assistants and Nurse Practitioners) who all work together to provide you with the care you need, when you need it.  We recommend signing up for the patient portal called "MyChart".  Sign up information is provided on this After Visit Summary.  MyChart is used to connect with patients for Virtual Visits (Telemedicine).  Patients are able to view lab/test results, encounter notes, upcoming appointments, etc.  Non-urgent messages can be sent to your provider as well.   To learn more about what you can do with MyChart, go to [email or web address].    Your next appointment:   1 year(s)  The format for your next appointment:   In Person  Provider:   You may see Lesleigh Noe, MD or one of the following Advanced Practice Providers on your designated Care Team:   Nada Boozer, NP   Other Instructions     Signed, Lesleigh Noe, MD  12/07/2020 4:34 PM    [facility]

## 2020-12-07 ENCOUNTER — Encounter: Payer: Self-pay | Admitting: *Deleted

## 2020-12-07 ENCOUNTER — Encounter: Payer: Self-pay | Admitting: Interventional Cardiology

## 2020-12-07 ENCOUNTER — Ambulatory Visit: Payer: Medicare PPO | Admitting: Interventional Cardiology

## 2020-12-07 ENCOUNTER — Other Ambulatory Visit: Payer: Self-pay

## 2020-12-07 VITALS — BP 130/80 | HR 59 | Ht 68.0 in | Wt 267.2 lb

## 2020-12-07 DIAGNOSIS — E1159 Type 2 diabetes mellitus with other circulatory complications: Secondary | ICD-10-CM

## 2020-12-07 DIAGNOSIS — Z9989 Dependence on other enabling machines and devices: Secondary | ICD-10-CM | POA: Diagnosis not present

## 2020-12-07 DIAGNOSIS — I2510 Atherosclerotic heart disease of native coronary artery without angina pectoris: Secondary | ICD-10-CM | POA: Diagnosis not present

## 2020-12-07 DIAGNOSIS — I10 Essential (primary) hypertension: Secondary | ICD-10-CM

## 2020-12-07 DIAGNOSIS — G4733 Obstructive sleep apnea (adult) (pediatric): Secondary | ICD-10-CM

## 2020-12-07 DIAGNOSIS — I209 Angina pectoris, unspecified: Secondary | ICD-10-CM | POA: Diagnosis not present

## 2020-12-07 DIAGNOSIS — E782 Mixed hyperlipidemia: Secondary | ICD-10-CM

## 2020-12-07 MED ORDER — NITROGLYCERIN 0.4 MG SL SUBL: 0.4000 mg | SUBLINGUAL_TABLET | SUBLINGUAL | 3 refills | Status: DC | PRN

## 2020-12-07 NOTE — Patient Instructions (Signed)
Medication Instructions:  Your physician recommends that you continue on your current medications as directed. Please refer to the Current Medication list given to you today.  *If you need a refill on your cardiac medications before your next appointment, please call your pharmacy*   Lab Work: None If you have labs (blood work) drawn today and your tests are completely normal, you will receive your results only by: MyChart Message (if you have MyChart) OR A paper copy in the mail If you have any lab test that is abnormal or we need to change your treatment, we will call you to review the results.   Testing/Procedures: Your physician has requested that you have a lexiscan myoview. For further information please visit [email or web address]. Please follow instruction sheet, as given.   Follow-Up: At [facility], you and your health needs are our priority.  As part of our continuing mission to provide you with exceptional heart care, we have created designated Provider Care Teams.  These Care Teams include your primary Cardiologist (physician) and Advanced Practice Providers (APPs -  Physician Assistants and Nurse Practitioners) who all work together to provide you with the care you need, when you need it.  We recommend signing up for the patient portal called "MyChart".  Sign up information is provided on this After Visit Summary.  MyChart is used to connect with patients for Virtual Visits (Telemedicine).  Patients are able to view lab/test results, encounter notes, upcoming appointments, etc.  Non-urgent messages can be sent to your provider as well.   To learn more about what you can do with MyChart, go to [email or web address].    Your next appointment:   1 year(s)  The format for your next appointment:   In Person  Provider:   You may see Henry W Kosinski III, MD or one of the following Advanced Practice Providers on your designated Care Team:   Laura Ingold, NP   Other  Instructions   

## 2020-12-10 ENCOUNTER — Telehealth (HOSPITAL_COMMUNITY): Payer: Self-pay

## 2020-12-10 NOTE — Telephone Encounter (Signed)
Spoke with the patient, detailed instructions given. He stated that he would be here for his test. Asked to call back with any questions. S.Aneta Hendershott EMTP 

## 2020-12-10 NOTE — Addendum Note (Signed)
Addended by: Lyn Records on: 12/10/2020 12:17 PM   Modules accepted: Orders

## 2020-12-10 NOTE — Addendum Note (Signed)
Addended by: Julio Sicks on: 12/10/2020 12:16 PM   Modules accepted: Orders

## 2020-12-13 ENCOUNTER — Other Ambulatory Visit: Payer: Self-pay | Admitting: Podiatry

## 2020-12-13 ENCOUNTER — Ambulatory Visit (HOSPITAL_COMMUNITY): Payer: Medicare PPO | Attending: Cardiology

## 2020-12-13 ENCOUNTER — Other Ambulatory Visit: Payer: Self-pay

## 2020-12-13 DIAGNOSIS — I209 Angina pectoris, unspecified: Secondary | ICD-10-CM

## 2020-12-13 DIAGNOSIS — I2510 Atherosclerotic heart disease of native coronary artery without angina pectoris: Secondary | ICD-10-CM | POA: Diagnosis not present

## 2020-12-13 LAB — MYOCARDIAL PERFUSION IMAGING
Base ST Depression (mm): 0 mm
LV dias vol: 72 mL (ref 62–150)
LV sys vol: 32 mL
Nuc Stress EF: 56 %
Peak HR: 90 {beats}/min
Rest HR: 64 {beats}/min
Rest Nuclear Isotope Dose: 11 mCi
SDS: 6
SRS: 0
SSS: 6
ST Depression (mm): 0 mm
Stress Nuclear Isotope Dose: 32.7 mCi
TID: 0.99

## 2020-12-13 MED ORDER — TECHNETIUM TC 99M TETROFOSMIN IV KIT
32.7000 | PACK | Freq: Once | INTRAVENOUS | Status: AC | PRN
  Administered 2020-12-13: 32.7 via INTRAVENOUS
  Filled 2020-12-13: qty 33

## 2020-12-13 MED ORDER — REGADENOSON 0.4 MG/5ML IV SOLN
0.4000 mg | Freq: Once | INTRAVENOUS | Status: AC
Start: 2020-12-13 — End: 2020-12-13
  Administered 2020-12-13: 0.4 mg via INTRAVENOUS

## 2020-12-13 MED ORDER — TECHNETIUM TC 99M TETROFOSMIN IV KIT
11.0000 | PACK | Freq: Once | INTRAVENOUS | Status: AC | PRN
  Administered 2020-12-13: 11 via INTRAVENOUS
  Filled 2020-12-13: qty 11

## 2020-12-14 ENCOUNTER — Telehealth: Payer: Self-pay | Admitting: Podiatry

## 2020-12-14 MED ORDER — PREGABALIN 150 MG PO CAPS: 150.0000 mg | ORAL_CAPSULE | Freq: Two times a day (BID) | ORAL | 0 refills | Status: DC

## 2020-12-14 NOTE — Addendum Note (Signed)
Addended by: Nicholes Rough on: 12/14/2020 02:29 PM   Modules accepted: Orders

## 2020-12-14 NOTE — Telephone Encounter (Signed)
Needs a refill of his pregablin.

## 2020-12-14 NOTE — Telephone Encounter (Signed)
Patient needs a refill of Pregabalin.

## 2020-12-14 NOTE — Telephone Encounter (Signed)
Patient needs a refill of Pregabalin.  

## 2020-12-17 NOTE — Telephone Encounter (Signed)
Please advise 

## 2021-01-04 ENCOUNTER — Other Ambulatory Visit: Payer: Self-pay | Admitting: Interventional Cardiology

## 2021-01-10 ENCOUNTER — Other Ambulatory Visit: Payer: Self-pay | Admitting: Podiatry

## 2021-01-10 ENCOUNTER — Telehealth: Payer: Self-pay | Admitting: Podiatry

## 2021-01-11 ENCOUNTER — Other Ambulatory Visit: Payer: Self-pay | Admitting: Podiatry

## 2021-01-11 MED ORDER — PREGABALIN 150 MG PO CAPS
150.0000 mg | ORAL_CAPSULE | Freq: Two times a day (BID) | ORAL | 0 refills | Status: DC
Start: 2021-01-11 — End: 2021-01-11

## 2021-01-11 MED ORDER — PREGABALIN 150 MG PO CAPS: 150.0000 mg | ORAL_CAPSULE | Freq: Two times a day (BID) | ORAL | 2 refills | Status: DC

## 2021-01-11 NOTE — Telephone Encounter (Signed)
Please advise 

## 2021-01-11 NOTE — Telephone Encounter (Signed)
Patient called to follow up on RX for Lyrica. Patient states he has only 1 pill left. Pt uses [facility]. Patient would like a phone call back when RX is sent over his number is [phone].

## 2021-01-11 NOTE — Progress Notes (Signed)
Sent Lyrica.

## 2021-01-11 NOTE — Telephone Encounter (Signed)
Medication refilled

## 2021-01-23 ENCOUNTER — Encounter: Payer: Self-pay | Admitting: Podiatry

## 2021-01-23 ENCOUNTER — Other Ambulatory Visit: Payer: Self-pay | Admitting: Podiatry

## 2021-01-23 MED ORDER — PREGABALIN 150 MG PO CAPS: 150.0000 mg | ORAL_CAPSULE | Freq: Two times a day (BID) | ORAL | 0 refills | Status: DC

## 2021-01-23 NOTE — Telephone Encounter (Signed)
Please advise 

## 2021-01-27 ENCOUNTER — Other Ambulatory Visit: Payer: Self-pay | Admitting: Podiatry

## 2021-01-30 DIAGNOSIS — G4733 Obstructive sleep apnea (adult) (pediatric): Secondary | ICD-10-CM | POA: Diagnosis not present

## 2021-02-01 ENCOUNTER — Other Ambulatory Visit: Payer: Self-pay | Admitting: Interventional Cardiology

## 2021-02-04 DIAGNOSIS — Z23 Encounter for immunization: Secondary | ICD-10-CM | POA: Diagnosis not present

## 2021-02-28 ENCOUNTER — Other Ambulatory Visit: Payer: Self-pay

## 2021-02-28 ENCOUNTER — Ambulatory Visit: Payer: Medicare PPO | Admitting: Podiatry

## 2021-02-28 DIAGNOSIS — M79675 Pain in left toe(s): Secondary | ICD-10-CM

## 2021-02-28 DIAGNOSIS — M79674 Pain in right toe(s): Secondary | ICD-10-CM

## 2021-02-28 DIAGNOSIS — E1140 Type 2 diabetes mellitus with diabetic neuropathy, unspecified: Secondary | ICD-10-CM | POA: Diagnosis not present

## 2021-02-28 DIAGNOSIS — B351 Tinea unguium: Secondary | ICD-10-CM | POA: Diagnosis not present

## 2021-03-03 NOTE — Progress Notes (Signed)
Subjective: 65 y.o. returns the office today for painful, elongated, thickened toenails which he cannot trim himself.  She has calluses in both feet that causes discomfort.  He has no new concerns.  No fevers or chills or any other systemic symptoms today.    PCP: Merlene Laughter, MD Last seen 02/04/2021  Objective: AAO 3, NAD; presents wearing regular shoes.  DP/PT pulses palpable, CRT less than 3 seconds Protective sensation decreased with Simms Weinstein monofilament Nails hypertrophic, dystrophic, elongated, brittle, discolored 10. There is tenderness overlying the nails 1-5 bilaterally. There is no surrounding erythema or drainage along the nail sites.  Flatfoot right > left with prominence of the talar head.  No area pinpoint bony tenderness or pain to vibratory sensation.  No open lesions. No pain with calf compression, swelling, warmth, erythema.  Assessment: Patient presents with symptomatic onychomycosis; neuropathy  Plan: -Treatment options including alternatives, risks, complications were discussed -Nails sharply debrided 10 without complication/bleeding.  -Continue inserts to help with the flatfoot as well as supportive shoe gear.  Continue moisturizer.  Minimal callus today which I did debride with any complications or bleeding as a courtesy as it was not significant. -Discussed daily foot inspection. If there are any changes, to call the office immediately.  -Follow-up in 3 months or sooner if any problems are to arise. In the meantime, encouraged to call the office with any questions, concerns, changes symptoms.  Ovid Curd, DPM

## 2021-03-05 DIAGNOSIS — I10 Essential (primary) hypertension: Secondary | ICD-10-CM | POA: Diagnosis not present

## 2021-03-05 DIAGNOSIS — E1142 Type 2 diabetes mellitus with diabetic polyneuropathy: Secondary | ICD-10-CM | POA: Diagnosis not present

## 2021-03-05 DIAGNOSIS — Z7984 Long term (current) use of oral hypoglycemic drugs: Secondary | ICD-10-CM | POA: Diagnosis not present

## 2021-03-05 DIAGNOSIS — R251 Tremor, unspecified: Secondary | ICD-10-CM | POA: Diagnosis not present

## 2021-03-25 ENCOUNTER — Other Ambulatory Visit: Payer: Self-pay | Admitting: Podiatry

## 2021-03-25 NOTE — Telephone Encounter (Signed)
Please advise 

## 2021-04-28 ENCOUNTER — Other Ambulatory Visit: Payer: Self-pay | Admitting: Interventional Cardiology

## 2021-05-02 ENCOUNTER — Ambulatory Visit: Payer: Medicare PPO | Admitting: Podiatry

## 2021-05-02 ENCOUNTER — Other Ambulatory Visit: Payer: Self-pay

## 2021-05-02 DIAGNOSIS — E1140 Type 2 diabetes mellitus with diabetic neuropathy, unspecified: Secondary | ICD-10-CM

## 2021-05-02 DIAGNOSIS — M79674 Pain in right toe(s): Secondary | ICD-10-CM

## 2021-05-02 DIAGNOSIS — L84 Corns and callosities: Secondary | ICD-10-CM

## 2021-05-02 DIAGNOSIS — B351 Tinea unguium: Secondary | ICD-10-CM

## 2021-05-02 DIAGNOSIS — M79675 Pain in left toe(s): Secondary | ICD-10-CM

## 2021-05-05 NOTE — Progress Notes (Signed)
Subjective: 66 y.o. returns the office today for painful, elongated, thickened toenails which he cannot trim himself.  She has calluses in both feet that causes discomfort at times he has noticed on the right foot dried blood form but denies any open sores or any swelling, redness or any drainage.  He has no other concerns today.  Denies any fevers or chills.  PCP: Merlene Laughter, MD Last seen 03/05/2021  Objective: AAO 3, NAD; presents wearing regular shoes.  DP/PT pulses palpable, CRT less than 3 seconds Protective sensation decreased with Simms Weinstein monofilament Nails hypertrophic, dystrophic, elongated, brittle, discolored 10. There is tenderness overlying the nails 1-5 bilaterally. There is no surrounding erythema or drainage along the nail sites.  Flatfoot right > left with prominence of the talar head.  No significant callus formation on the left side.  The right side there is minimal callus formation there is dried blood present underneath this.  Upon debridement there is no ongoing ulceration drainage or any signs of infection.  Positive debride most of the dried blood in the callus today. No pain with calf compression, swelling, warmth, erythema.  Assessment: Patient presents with symptomatic onychomycosis; neuropathy; preulcerative lesion right foot  Plan: -Treatment options including alternatives, risks, complications were discussed -Nails sharply debrided 10 without complication/bleeding.  -Sharply through the hyperkeratotic lesion, preulcerative lesion on the right foot without any complications or bleeding.  Continue offloading at all times and monitor for any skin breakdown. -Continue shoes, orthotics. -Discussed daily foot inspection. If there are any changes, to call the office immediately.  -Follow-up in 3 months or sooner if any problems are to arise. In the meantime, encouraged to call the office with any questions, concerns, changes symptoms.  Ovid Curd,  DPM

## 2021-05-06 DIAGNOSIS — E119 Type 2 diabetes mellitus without complications: Secondary | ICD-10-CM | POA: Diagnosis not present

## 2021-05-06 DIAGNOSIS — H5213 Myopia, bilateral: Secondary | ICD-10-CM | POA: Diagnosis not present

## 2021-05-06 DIAGNOSIS — H2513 Age-related nuclear cataract, bilateral: Secondary | ICD-10-CM | POA: Diagnosis not present

## 2021-05-10 ENCOUNTER — Other Ambulatory Visit: Payer: Self-pay | Admitting: Podiatry

## 2021-05-24 ENCOUNTER — Other Ambulatory Visit: Payer: Self-pay | Admitting: Podiatry

## 2021-06-05 DIAGNOSIS — I10 Essential (primary) hypertension: Secondary | ICD-10-CM | POA: Diagnosis not present

## 2021-06-05 DIAGNOSIS — I25119 Atherosclerotic heart disease of native coronary artery with unspecified angina pectoris: Secondary | ICD-10-CM | POA: Diagnosis not present

## 2021-06-05 DIAGNOSIS — E1142 Type 2 diabetes mellitus with diabetic polyneuropathy: Secondary | ICD-10-CM | POA: Diagnosis not present

## 2021-06-05 DIAGNOSIS — Z6841 Body Mass Index (BMI) 40.0 and over, adult: Secondary | ICD-10-CM | POA: Diagnosis not present

## 2021-06-05 DIAGNOSIS — R21 Rash and other nonspecific skin eruption: Secondary | ICD-10-CM | POA: Diagnosis not present

## 2021-06-28 ENCOUNTER — Ambulatory Visit: Payer: Medicare PPO | Admitting: Podiatry

## 2021-06-28 ENCOUNTER — Other Ambulatory Visit: Payer: Self-pay

## 2021-06-28 DIAGNOSIS — M79675 Pain in left toe(s): Secondary | ICD-10-CM | POA: Diagnosis not present

## 2021-06-28 DIAGNOSIS — M79674 Pain in right toe(s): Secondary | ICD-10-CM | POA: Diagnosis not present

## 2021-06-28 DIAGNOSIS — E1140 Type 2 diabetes mellitus with diabetic neuropathy, unspecified: Secondary | ICD-10-CM

## 2021-06-28 DIAGNOSIS — B351 Tinea unguium: Secondary | ICD-10-CM

## 2021-07-01 ENCOUNTER — Ambulatory Visit: Payer: Medicare PPO | Admitting: Podiatry

## 2021-07-02 NOTE — Progress Notes (Signed)
Subjective: ?66 y.o. returns the office today for painful, elongated, thickened toenails which he cannot trim himself.  He also has calluses in both feet that causes discomfort at times but has not seen any opening, swelling or redness or any drainage.  He has no other concerns today.  Denies any fevers or chills. ? ?PCP: Merlene Laughter, MD ?Last seen June 05, 2021 ? ?Objective: ?AAO ?3, NAD; presents wearing regular shoes.  ?DP/PT pulses palpable, CRT less than 3 seconds ?Protective sensation decreased with Dorann Ou monofilament ?Nails hypertrophic, dystrophic, elongated, brittle, discolored ?10. There is tenderness overlying the nails 1-5 bilaterally. There is no surrounding erythema or drainage along the nail sites.  ?Flatfoot right > left with prominence of the talar head.  Minimal callus formation on the left and right side.  There is no underlying ulceration drainage or any signs of infection noted.  ?No pain with calf compression, swelling, warmth, erythema. ? ?Assessment: ?Patient presents with symptomatic onychomycosis; neuropathy; preulcerative lesion right foot ? ?Plan: ?-Treatment options including alternatives, risks, complications were discussed ?-Nails sharply debrided ?10 without complication/bleeding.  ?-Debrided the hyperkeratotic lesions bilaterally with any complications of bleeding but there was minimal callus so I did this as a courtesy. ?-Continue supportive shoes, orthotics. ?-Discussed daily foot inspection. If there are any changes, to call the office immediately.  ?-Follow-up in 3 months or sooner if any problems are to arise. In the meantime, encouraged to call the office with any questions, concerns, changes symptoms. ? ?Ovid Curd, DPM ? ? ? ?

## 2021-07-19 ENCOUNTER — Other Ambulatory Visit: Payer: Self-pay | Admitting: Podiatry

## 2021-07-26 DIAGNOSIS — M9902 Segmental and somatic dysfunction of thoracic region: Secondary | ICD-10-CM | POA: Diagnosis not present

## 2021-07-26 DIAGNOSIS — M9901 Segmental and somatic dysfunction of cervical region: Secondary | ICD-10-CM | POA: Diagnosis not present

## 2021-07-26 DIAGNOSIS — M9905 Segmental and somatic dysfunction of pelvic region: Secondary | ICD-10-CM | POA: Diagnosis not present

## 2021-07-26 DIAGNOSIS — M9903 Segmental and somatic dysfunction of lumbar region: Secondary | ICD-10-CM | POA: Diagnosis not present

## 2021-08-01 DIAGNOSIS — M9903 Segmental and somatic dysfunction of lumbar region: Secondary | ICD-10-CM | POA: Diagnosis not present

## 2021-08-01 DIAGNOSIS — M9901 Segmental and somatic dysfunction of cervical region: Secondary | ICD-10-CM | POA: Diagnosis not present

## 2021-08-01 DIAGNOSIS — M9902 Segmental and somatic dysfunction of thoracic region: Secondary | ICD-10-CM | POA: Diagnosis not present

## 2021-08-01 DIAGNOSIS — M9905 Segmental and somatic dysfunction of pelvic region: Secondary | ICD-10-CM | POA: Diagnosis not present

## 2021-08-05 DIAGNOSIS — M9905 Segmental and somatic dysfunction of pelvic region: Secondary | ICD-10-CM | POA: Diagnosis not present

## 2021-08-05 DIAGNOSIS — M9902 Segmental and somatic dysfunction of thoracic region: Secondary | ICD-10-CM | POA: Diagnosis not present

## 2021-08-05 DIAGNOSIS — M9903 Segmental and somatic dysfunction of lumbar region: Secondary | ICD-10-CM | POA: Diagnosis not present

## 2021-08-05 DIAGNOSIS — M9901 Segmental and somatic dysfunction of cervical region: Secondary | ICD-10-CM | POA: Diagnosis not present

## 2021-08-09 ENCOUNTER — Encounter: Payer: Self-pay | Admitting: Podiatry

## 2021-08-09 ENCOUNTER — Other Ambulatory Visit: Payer: Self-pay | Admitting: Podiatry

## 2021-08-12 ENCOUNTER — Other Ambulatory Visit: Payer: Self-pay | Admitting: *Deleted

## 2021-08-12 ENCOUNTER — Telehealth: Payer: Self-pay | Admitting: *Deleted

## 2021-08-12 DIAGNOSIS — M9903 Segmental and somatic dysfunction of lumbar region: Secondary | ICD-10-CM | POA: Diagnosis not present

## 2021-08-12 DIAGNOSIS — M9905 Segmental and somatic dysfunction of pelvic region: Secondary | ICD-10-CM | POA: Diagnosis not present

## 2021-08-12 DIAGNOSIS — M9902 Segmental and somatic dysfunction of thoracic region: Secondary | ICD-10-CM | POA: Diagnosis not present

## 2021-08-12 DIAGNOSIS — M9901 Segmental and somatic dysfunction of cervical region: Secondary | ICD-10-CM | POA: Diagnosis not present

## 2021-08-12 MED ORDER — PREGABALIN 150 MG PO CAPS: 150.0000 mg | ORAL_CAPSULE | Freq: Two times a day (BID) | ORAL | 0 refills | Status: DC

## 2021-08-12 NOTE — Telephone Encounter (Addendum)
Patient is calling for a refill of his pregablin-150 mg. Please advise. ?

## 2021-08-13 ENCOUNTER — Ambulatory Visit: Payer: Medicare PPO | Admitting: Orthopaedic Surgery

## 2021-08-13 ENCOUNTER — Ambulatory Visit (INDEPENDENT_AMBULATORY_CARE_PROVIDER_SITE_OTHER): Payer: Medicare PPO

## 2021-08-13 ENCOUNTER — Encounter: Payer: Self-pay | Admitting: Orthopaedic Surgery

## 2021-08-13 VITALS — Ht 68.0 in | Wt 279.0 lb

## 2021-08-13 DIAGNOSIS — G8929 Other chronic pain: Secondary | ICD-10-CM

## 2021-08-13 DIAGNOSIS — M545 Low back pain, unspecified: Secondary | ICD-10-CM

## 2021-08-13 MED ORDER — METHOCARBAMOL 500 MG PO TABS: 500.0000 mg | ORAL_TABLET | Freq: Two times a day (BID) | ORAL | 2 refills | Status: DC | PRN

## 2021-08-13 NOTE — Progress Notes (Signed)
? ?Office Visit Note ?  ?Patient: [name]           ?Date of Birth: 07-22-1955           ?MRN: [id number] ?Visit Date: 08/13/2021 ?             ?Requested by: Merlene Laughter, MD ?[address] ?[address] ?[place],  [place] [address] ?PCP: Merlene Laughter, MD ? ? ?Assessment & Plan: ?Visit Diagnoses:  ?1. Chronic left-sided low back pain, unspecified whether sciatica present   ? ? ?Plan: Impression is chronic left-sided low back pain rating to the anterior thigh.  Patient currently denies any groin pain.  Hip exam is unremarkable.  I believe his symptoms are likely coming from his lumbar spine.  He is currently on a aspirin 325 so I do not want to start him on a steroid taper today.  I have sent in muscle relaxers to take as needed.  I would also like to start him in physical therapy for which she is agreeable to.  If his symptoms do not improve in the next 6 to 8 weeks he will call with an MRI ? ?Follow-Up Instructions: No follow-ups on file.  ? ?Orders:  ?Orders Placed This Encounter  ?Procedures  ? XR Lumbar Spine 2-3 Views  ? Ambulatory referral to Physical Therapy  ? ?Meds ordered this encounter  ?Medications  ? methocarbamol (ROBAXIN) 500 MG tablet  ?  Sig: Take 1 tablet (500 mg total) by mouth 2 (two) times daily as needed for muscle spasms.  ?  Dispense:  2 tablet  ?  Refill:  2  ? ? ? ? Procedures: ?No procedures performed ? ? ?Clinical Data: ?No additional findings. ? ? ?Subjective: ?Chief Complaint  ?Patient presents with  ? Lower Back - Pain  ? Left Hip - Pain  ? ? ?HPI patient is a pleasant 66 year old gentleman who comes in today with left-sided low back pain which radiates to the anterior thigh.  This is been ongoing for the past few weeks after planting on her viscous flour.  The pain he has is primarily with sitting.  He did has slight weakness of the left lower extremity.  He has been taking Tylenol with mild relief.  He does have history of peripheral neuropathy.  No bowel or bladder  change. ? ?Review of Systems as detailed in HPI.  All others reviewed and are negative. ? ? ?Objective: ?Vital Signs: Ht 5\' 8"  (1.727 m)   Wt 279 lb (126.6 kg)   BMI 42.42 kg/m?  ? ?Physical Exam well-developed well-nourished gentleman in no acute distress.  Alert and oriented x3. ? ?Ortho Exam lumbar spine shows no spinous tenderness.  He does have moderate left-sided paraspinous tenderness.  No pain with lumbar flexion or extension.  Negative logroll negative FADIR.  Positive straight leg raise.  He is neurovascular intact distally. ? ?Specialty Comments:  ?No specialty comments available. ? ?Imaging: ?XR Lumbar Spine 2-3 Views ? ?Result Date: 08/13/2021 ?Multilevel spondylosis worse at L5-S1  ? ? ?PMFS History: ?Patient Active Problem List  ? Diagnosis Date Noted  ? Angina pectoris (HCC) 06/18/2020  ? Gastroesophageal reflux disease 06/18/2020  ? Peripheral neuropathy 06/18/2020  ? Peripheral venous insufficiency 06/18/2020  ? Polyneuropathy due to type 2 diabetes mellitus (HCC) 06/18/2020  ? Body mass index 40.0-44.9, adult (HCC) 02/16/2018  ? Primary osteoarthritis of left knee 01/29/2018  ? S/P TKR (total knee replacement), left 01/29/2018  ? Primary gout 01/11/2014  ?  CAD (coronary artery disease), native coronary artery 07/26/2013  ? Essential hypertension 07/26/2013  ? Hyperlipidemia 07/26/2013  ? Morbid obesity (HCC) 07/26/2013  ? Obstructive sleep apnea 07/26/2013  ? ?Past Medical History:  ?Diagnosis Date  ? Arthritis   ? "ankles, knees" (01/29/2018)  ? CAD in native artery   ? cath report from 02/2017 indicates patient had a history of RCA stent >10 years prior to that. At time of that cath he was found to have EF 70%, 20-30% ostial LAD, 30-40% prox LAD, 30-40% Cx, 80-90% Cx beyond OM1, totally occluded RCA with left-to-right collaterals.  ? Diabetes mellitus type 2 in obese [facility])   ? GERD (gastroesophageal reflux disease)   ? Hyperlipidemia   ? Hypertension   ? Morbid obesity (HCC)   ? OSA on CPAP   ?   ?Family History  ?Problem Relation Age of Onset  ? Heart attack Mother   ? Heart attack Father   ?  ?Past Surgical History:  ?Procedure Laterality Date  ? CARDIAC CATHETERIZATION  2008  ? CORONARY ANGIOPLASTY WITH STENT PLACEMENT  1997  ? JOINT REPLACEMENT    ? KNEE ARTHROSCOPY Bilateral   ? "meniscus repairs"  ? TONSILLECTOMY    ? TOTAL KNEE ARTHROPLASTY Left 01/29/2018  ? TOTAL KNEE ARTHROPLASTY Left 01/29/2018  ? Procedure: LEFT TOTAL KNEE ARTHROPLASTY;  Surgeon: Tarry Kos, MD;  Location: [facility] OR;  Service: Orthopedics;  Laterality: Left;  ? UVULOPALATOPHARYNGOPLASTY  ~ 1994  ? ?Social History  ? ?Occupational History  ? Not on file  ?Tobacco Use  ? Smoking status: Never  ? Smokeless tobacco: Never  ?Vaping Use  ? Vaping Use: Never used  ?Substance and Sexual Activity  ? Alcohol use: Yes  ?  Comment: 01/29/2018 "may have 1 beer/month; if that"  ? Drug use: Never  ? Sexual activity: Not Currently  ? ? ? ? ? ? ?

## 2021-08-15 ENCOUNTER — Ambulatory Visit: Payer: Medicare PPO | Admitting: Orthopaedic Surgery

## 2021-08-22 DIAGNOSIS — M545 Low back pain, unspecified: Secondary | ICD-10-CM | POA: Diagnosis not present

## 2021-08-22 DIAGNOSIS — M2569 Stiffness of other specified joint, not elsewhere classified: Secondary | ICD-10-CM | POA: Diagnosis not present

## 2021-08-22 DIAGNOSIS — R269 Unspecified abnormalities of gait and mobility: Secondary | ICD-10-CM | POA: Diagnosis not present

## 2021-08-22 DIAGNOSIS — M62551 Muscle wasting and atrophy, not elsewhere classified, right thigh: Secondary | ICD-10-CM | POA: Diagnosis not present

## 2021-08-27 DIAGNOSIS — M2569 Stiffness of other specified joint, not elsewhere classified: Secondary | ICD-10-CM | POA: Diagnosis not present

## 2021-08-27 DIAGNOSIS — M62551 Muscle wasting and atrophy, not elsewhere classified, right thigh: Secondary | ICD-10-CM | POA: Diagnosis not present

## 2021-08-27 DIAGNOSIS — R269 Unspecified abnormalities of gait and mobility: Secondary | ICD-10-CM | POA: Diagnosis not present

## 2021-08-27 DIAGNOSIS — M545 Low back pain, unspecified: Secondary | ICD-10-CM | POA: Diagnosis not present

## 2021-08-29 DIAGNOSIS — R269 Unspecified abnormalities of gait and mobility: Secondary | ICD-10-CM | POA: Diagnosis not present

## 2021-08-29 DIAGNOSIS — M545 Low back pain, unspecified: Secondary | ICD-10-CM | POA: Diagnosis not present

## 2021-08-29 DIAGNOSIS — M62551 Muscle wasting and atrophy, not elsewhere classified, right thigh: Secondary | ICD-10-CM | POA: Diagnosis not present

## 2021-08-29 DIAGNOSIS — M2569 Stiffness of other specified joint, not elsewhere classified: Secondary | ICD-10-CM | POA: Diagnosis not present

## 2021-08-30 ENCOUNTER — Ambulatory Visit: Payer: Medicare PPO | Admitting: Podiatry

## 2021-09-02 DIAGNOSIS — M2569 Stiffness of other specified joint, not elsewhere classified: Secondary | ICD-10-CM | POA: Diagnosis not present

## 2021-09-02 DIAGNOSIS — M62551 Muscle wasting and atrophy, not elsewhere classified, right thigh: Secondary | ICD-10-CM | POA: Diagnosis not present

## 2021-09-02 DIAGNOSIS — R269 Unspecified abnormalities of gait and mobility: Secondary | ICD-10-CM | POA: Diagnosis not present

## 2021-09-02 DIAGNOSIS — M545 Low back pain, unspecified: Secondary | ICD-10-CM | POA: Diagnosis not present

## 2021-09-05 DIAGNOSIS — R269 Unspecified abnormalities of gait and mobility: Secondary | ICD-10-CM | POA: Diagnosis not present

## 2021-09-05 DIAGNOSIS — M2569 Stiffness of other specified joint, not elsewhere classified: Secondary | ICD-10-CM | POA: Diagnosis not present

## 2021-09-05 DIAGNOSIS — M62551 Muscle wasting and atrophy, not elsewhere classified, right thigh: Secondary | ICD-10-CM | POA: Diagnosis not present

## 2021-09-05 DIAGNOSIS — M545 Low back pain, unspecified: Secondary | ICD-10-CM | POA: Diagnosis not present

## 2021-09-06 ENCOUNTER — Encounter: Payer: Self-pay | Admitting: *Deleted

## 2021-09-06 NOTE — Congregational Nurse Program (Signed)
[id number] [name] due to the death of his wife [name] from ms and hypoxia.  Will stay in contact for any needs.

## 2021-09-10 ENCOUNTER — Other Ambulatory Visit: Payer: Self-pay | Admitting: Podiatry

## 2021-09-10 ENCOUNTER — Encounter: Payer: Self-pay | Admitting: Podiatry

## 2021-09-10 NOTE — Telephone Encounter (Signed)
Please advise 

## 2021-09-11 ENCOUNTER — Encounter: Payer: Self-pay | Admitting: Podiatry

## 2021-09-11 ENCOUNTER — Other Ambulatory Visit: Payer: Self-pay | Admitting: Podiatry

## 2021-09-11 MED ORDER — PREGABALIN 150 MG PO CAPS: 150.0000 mg | ORAL_CAPSULE | Freq: Two times a day (BID) | ORAL | 0 refills | Status: DC

## 2021-09-11 NOTE — Telephone Encounter (Signed)
Please advise 

## 2021-09-17 ENCOUNTER — Ambulatory Visit: Payer: Medicare PPO | Admitting: Podiatry

## 2021-09-17 ENCOUNTER — Emergency Department (HOSPITAL_BASED_OUTPATIENT_CLINIC_OR_DEPARTMENT_OTHER): Payer: Medicare PPO

## 2021-09-17 ENCOUNTER — Ambulatory Visit (INDEPENDENT_AMBULATORY_CARE_PROVIDER_SITE_OTHER): Payer: Medicare PPO

## 2021-09-17 ENCOUNTER — Other Ambulatory Visit: Payer: Self-pay

## 2021-09-17 ENCOUNTER — Encounter (HOSPITAL_BASED_OUTPATIENT_CLINIC_OR_DEPARTMENT_OTHER): Payer: Self-pay | Admitting: Obstetrics and Gynecology

## 2021-09-17 ENCOUNTER — Inpatient Hospital Stay (HOSPITAL_BASED_OUTPATIENT_CLINIC_OR_DEPARTMENT_OTHER)
Admission: EM | Admit: 2021-09-17 | Discharge: 2021-09-19 | DRG: 872 | Disposition: A | Discharge status: Home / Self Care | Payer: Medicare PPO | Attending: Internal Medicine | Admitting: Internal Medicine

## 2021-09-17 DIAGNOSIS — L97412 Non-pressure chronic ulcer of right heel and midfoot with fat layer exposed: Secondary | ICD-10-CM | POA: Diagnosis not present

## 2021-09-17 DIAGNOSIS — Z872 Personal history of diseases of the skin and subcutaneous tissue: Secondary | ICD-10-CM | POA: Diagnosis not present

## 2021-09-17 DIAGNOSIS — Z20822 Contact with and (suspected) exposure to covid-19: Secondary | ICD-10-CM | POA: Diagnosis present

## 2021-09-17 DIAGNOSIS — Z7982 Long term (current) use of aspirin: Secondary | ICD-10-CM | POA: Diagnosis not present

## 2021-09-17 DIAGNOSIS — E119 Type 2 diabetes mellitus without complications: Secondary | ICD-10-CM

## 2021-09-17 DIAGNOSIS — Z880 Allergy status to penicillin: Secondary | ICD-10-CM | POA: Diagnosis not present

## 2021-09-17 DIAGNOSIS — A419 Sepsis, unspecified organism: Principal | ICD-10-CM | POA: Diagnosis present

## 2021-09-17 DIAGNOSIS — Z96652 Presence of left artificial knee joint: Secondary | ICD-10-CM | POA: Diagnosis present

## 2021-09-17 DIAGNOSIS — I10 Essential (primary) hypertension: Secondary | ICD-10-CM | POA: Diagnosis present

## 2021-09-17 DIAGNOSIS — J9811 Atelectasis: Secondary | ICD-10-CM | POA: Diagnosis not present

## 2021-09-17 DIAGNOSIS — Z888 Allergy status to other drugs, medicaments and biological substances status: Secondary | ICD-10-CM | POA: Diagnosis not present

## 2021-09-17 DIAGNOSIS — R509 Fever, unspecified: Secondary | ICD-10-CM | POA: Diagnosis not present

## 2021-09-17 DIAGNOSIS — E11628 Type 2 diabetes mellitus with other skin complications: Secondary | ICD-10-CM | POA: Diagnosis present

## 2021-09-17 DIAGNOSIS — G4733 Obstructive sleep apnea (adult) (pediatric): Secondary | ICD-10-CM | POA: Diagnosis present

## 2021-09-17 DIAGNOSIS — L97519 Non-pressure chronic ulcer of other part of right foot with unspecified severity: Secondary | ICD-10-CM | POA: Diagnosis present

## 2021-09-17 DIAGNOSIS — I2510 Atherosclerotic heart disease of native coronary artery without angina pectoris: Secondary | ICD-10-CM | POA: Diagnosis present

## 2021-09-17 DIAGNOSIS — Z79899 Other long term (current) drug therapy: Secondary | ICD-10-CM | POA: Diagnosis not present

## 2021-09-17 DIAGNOSIS — E1140 Type 2 diabetes mellitus with diabetic neuropathy, unspecified: Secondary | ICD-10-CM | POA: Diagnosis present

## 2021-09-17 DIAGNOSIS — Z7984 Long term (current) use of oral hypoglycemic drugs: Secondary | ICD-10-CM | POA: Diagnosis not present

## 2021-09-17 DIAGNOSIS — Z955 Presence of coronary angioplasty implant and graft: Secondary | ICD-10-CM | POA: Diagnosis not present

## 2021-09-17 DIAGNOSIS — G629 Polyneuropathy, unspecified: Secondary | ICD-10-CM | POA: Diagnosis not present

## 2021-09-17 DIAGNOSIS — E11621 Type 2 diabetes mellitus with foot ulcer: Secondary | ICD-10-CM | POA: Diagnosis present

## 2021-09-17 DIAGNOSIS — M1711 Unilateral primary osteoarthritis, right knee: Secondary | ICD-10-CM | POA: Diagnosis present

## 2021-09-17 DIAGNOSIS — K219 Gastro-esophageal reflux disease without esophagitis: Secondary | ICD-10-CM | POA: Diagnosis present

## 2021-09-17 DIAGNOSIS — M7989 Other specified soft tissue disorders: Secondary | ICD-10-CM | POA: Diagnosis not present

## 2021-09-17 DIAGNOSIS — E785 Hyperlipidemia, unspecified: Secondary | ICD-10-CM | POA: Diagnosis present

## 2021-09-17 DIAGNOSIS — M109 Gout, unspecified: Secondary | ICD-10-CM | POA: Diagnosis present

## 2021-09-17 DIAGNOSIS — E782 Mixed hyperlipidemia: Secondary | ICD-10-CM | POA: Diagnosis not present

## 2021-09-17 DIAGNOSIS — L089 Local infection of the skin and subcutaneous tissue, unspecified: Secondary | ICD-10-CM | POA: Diagnosis present

## 2021-09-17 DIAGNOSIS — Z8249 Family history of ischemic heart disease and other diseases of the circulatory system: Secondary | ICD-10-CM

## 2021-09-17 DIAGNOSIS — E11610 Type 2 diabetes mellitus with diabetic neuropathic arthropathy: Secondary | ICD-10-CM | POA: Diagnosis present

## 2021-09-17 DIAGNOSIS — E1142 Type 2 diabetes mellitus with diabetic polyneuropathy: Secondary | ICD-10-CM | POA: Diagnosis not present

## 2021-09-17 LAB — COMPREHENSIVE METABOLIC PANEL
ALT: 25 U/L (ref 0–44)
AST: 25 U/L (ref 15–41)
Albumin: 3.9 g/dL (ref 3.5–5.0)
Alkaline Phosphatase: 70 U/L (ref 38–126)
Anion gap: 13 (ref 5–15)
BUN: 24 mg/dL — ABNORMAL HIGH (ref 8–23)
CO2: 22 mmol/L (ref 22–32)
Calcium: 9.5 mg/dL (ref 8.9–10.3)
Chloride: 102 mmol/L (ref 98–111)
Creatinine, Ser: 1.08 mg/dL (ref 0.61–1.24)
GFR, Estimated: >60 mL/min (ref >60)
Glucose, Bld: 161 mg/dL — ABNORMAL HIGH (ref 70–99)
Potassium: 4.5 mmol/L (ref 3.5–5.1)
Sodium: 137 mmol/L (ref 135–145)
Total Bilirubin: 0.9 mg/dL (ref 0.3–1.2)
Total Protein: 6.9 g/dL (ref 6.5–8.1)

## 2021-09-17 LAB — CBC WITH DIFFERENTIAL/PLATELET
Abs Immature Granulocytes: 0.07 10*3/uL (ref 0.00–0.07)
Basophils Absolute: 0.1 10*3/uL (ref 0.0–0.1)
Basophils Relative: 0 %
Eosinophils Absolute: 0 10*3/uL (ref 0.0–0.5)
Eosinophils Relative: 0 %
HCT: 47.5 % (ref 39.0–52.0)
Hemoglobin: 15.6 g/dL (ref 13.0–17.0)
Immature Granulocytes: 1 %
Lymphocytes Relative: 5 %
Lymphs Abs: 0.7 10*3/uL (ref 0.7–4.0)
MCH: 29.5 pg (ref 26.0–34.0)
MCHC: 32.8 g/dL (ref 30.0–36.0)
MCV: 89.8 fL (ref 80.0–100.0)
Monocytes Absolute: 0.9 10*3/uL (ref 0.1–1.0)
Monocytes Relative: 6 %
Neutro Abs: 12.8 10*3/uL — ABNORMAL HIGH (ref 1.7–7.7)
Neutrophils Relative %: 88 %
Platelets: 118 10*3/uL — ABNORMAL LOW (ref 150–400)
RBC: 5.29 MIL/uL (ref 4.22–5.81)
RDW: 15.3 % (ref 11.5–15.5)
WBC: 14.6 10*3/uL — ABNORMAL HIGH (ref 4.0–10.5)
nRBC: 0 % (ref 0.0–0.2)

## 2021-09-17 LAB — SARS CORONAVIRUS 2 BY RT PCR: SARS Coronavirus 2 by RT PCR: NEGATIVE

## 2021-09-17 LAB — LACTIC ACID, PLASMA
Lactic Acid, Venous: 3.6 mmol/L (ref 0.5–1.9)
Lactic Acid, Venous: 4.3 mmol/L (ref 0.5–1.9)
Lactic Acid, Venous: 3.4 mmol/L (ref 0.5–1.9)
Lactic Acid, Venous: 2.9 mmol/L (ref 0.5–1.9)

## 2021-09-17 LAB — URINALYSIS, ROUTINE W REFLEX MICROSCOPIC
Bilirubin Urine: NEGATIVE
Glucose, UA: >1000 mg/dL — AB
Hgb urine dipstick: NEGATIVE
Ketones, ur: NEGATIVE mg/dL
Leukocytes,Ua: NEGATIVE
Nitrite: NEGATIVE
Protein, ur: NEGATIVE mg/dL
Specific Gravity, Urine: 1.022 (ref 1.005–1.030)
pH: 5.5 (ref 5.0–8.0)

## 2021-09-17 MED ORDER — METRONIDAZOLE 500 MG/100ML IV SOLN
500.0000 mg | Freq: Two times a day (BID) | INTRAVENOUS | Status: DC
  Administered 2021-09-17 – 2021-09-18 (×3): 500 mg via INTRAVENOUS
  Filled 2021-09-17 (×3): qty 100

## 2021-09-17 MED ORDER — ACETAMINOPHEN 325 MG PO TABS
650.0000 mg | ORAL_TABLET | Freq: Four times a day (QID) | ORAL | Status: DC | PRN
  Administered 2021-09-17 – 2021-09-18 (×2): 650 mg via ORAL
  Filled 2021-09-17 (×2): qty 2

## 2021-09-17 MED ORDER — ISOSORBIDE MONONITRATE ER 60 MG PO TB24
120.0000 mg | ORAL_TABLET | Freq: Every day | ORAL | Status: DC
  Administered 2021-09-18 – 2021-09-19 (×2): 120 mg via ORAL
  Filled 2021-09-17 (×2): qty 2

## 2021-09-17 MED ORDER — LACTATED RINGERS IV BOLUS (SEPSIS)
2000.0000 mL | Freq: Once | INTRAVENOUS | Status: AC
  Administered 2021-09-17: 1000 mL via INTRAVENOUS

## 2021-09-17 MED ORDER — ACETAMINOPHEN 650 MG RE SUPP: 650.0000 mg | Freq: Four times a day (QID) | RECTAL | Status: DC | PRN

## 2021-09-17 MED ORDER — VANCOMYCIN HCL 500 MG IV SOLR: 500.0000 mg | Freq: Once | INTRAVENOUS | Status: DC

## 2021-09-17 MED ORDER — VANCOMYCIN HCL 1750 MG/350ML IV SOLN
1750.0000 mg | INTRAVENOUS | Status: DC
  Administered 2021-09-18: 1750 mg via INTRAVENOUS
  Filled 2021-09-17 (×3): qty 350

## 2021-09-17 MED ORDER — PREGABALIN 75 MG PO CAPS
150.0000 mg | ORAL_CAPSULE | Freq: Two times a day (BID) | ORAL | Status: DC
  Administered 2021-09-17 – 2021-09-19 (×4): 150 mg via ORAL
  Filled 2021-09-17 (×4): qty 2

## 2021-09-17 MED ORDER — TETRAHYDROZOLINE HCL 0.05 % OP SOLN
2.0000 [drp] | Freq: Every day | OPHTHALMIC | Status: DC | PRN
  Filled 2021-09-17: qty 15

## 2021-09-17 MED ORDER — ACETAMINOPHEN 325 MG PO TABS
650.0000 mg | ORAL_TABLET | Freq: Four times a day (QID) | ORAL | Status: DC | PRN
  Administered 2021-09-17: 650 mg via ORAL
  Filled 2021-09-17: qty 2

## 2021-09-17 MED ORDER — VANCOMYCIN HCL IN DEXTROSE 1-5 GM/200ML-% IV SOLN
1000.0000 mg | Freq: Once | INTRAVENOUS | Status: AC
  Administered 2021-09-17: 1000 mg via INTRAVENOUS
  Filled 2021-09-17 (×2): qty 200

## 2021-09-17 MED ORDER — LACTATED RINGERS IV BOLUS: 1000.0000 mL | Freq: Once | INTRAVENOUS | Status: DC

## 2021-09-17 MED ORDER — PANTOPRAZOLE SODIUM 40 MG PO TBEC
40.0000 mg | DELAYED_RELEASE_TABLET | Freq: Every day | ORAL | Status: DC
  Administered 2021-09-18 – 2021-09-19 (×2): 40 mg via ORAL
  Filled 2021-09-17 (×2): qty 1

## 2021-09-17 MED ORDER — VANCOMYCIN HCL 2000 MG/400ML IV SOLN: 2000.0000 mg | Freq: Once | INTRAVENOUS | Status: DC

## 2021-09-17 MED ORDER — SODIUM CHLORIDE 0.9 % IV SOLN
2.0000 g | Freq: Once | INTRAVENOUS | Status: AC
  Administered 2021-09-17: 2 g via INTRAVENOUS
  Filled 2021-09-17 (×2): qty 20

## 2021-09-17 MED ORDER — POLYETHYLENE GLYCOL 3350 17 G PO PACK: 17.0000 g | PACK | Freq: Every day | ORAL | Status: DC | PRN

## 2021-09-17 MED ORDER — ATORVASTATIN CALCIUM 40 MG PO TABS
80.0000 mg | ORAL_TABLET | Freq: Every day | ORAL | Status: DC
  Administered 2021-09-17 – 2021-09-18 (×2): 80 mg via ORAL
  Filled 2021-09-17 (×2): qty 2

## 2021-09-17 MED ORDER — LACTATED RINGERS IV BOLUS (SEPSIS)
1000.0000 mL | Freq: Once | INTRAVENOUS | Status: AC
  Administered 2021-09-17: 1000 mL via INTRAVENOUS

## 2021-09-17 MED ORDER — SODIUM CHLORIDE 0.9% FLUSH
3.0000 mL | Freq: Two times a day (BID) | INTRAVENOUS | Status: DC
  Administered 2021-09-17 – 2021-09-18 (×3): 3 mL via INTRAVENOUS

## 2021-09-17 MED ORDER — VANCOMYCIN HCL IN DEXTROSE 1-5 GM/200ML-% IV SOLN
1000.0000 mg | Freq: Once | INTRAVENOUS | Status: AC
  Administered 2021-09-17: 1000 mg via INTRAVENOUS

## 2021-09-17 MED ORDER — LACTATED RINGERS IV SOLN: INTRAVENOUS | Status: DC

## 2021-09-17 MED ORDER — SODIUM CHLORIDE 0.9 % IV SOLN
2.0000 g | Freq: Three times a day (TID) | INTRAVENOUS | Status: DC
  Administered 2021-09-17 – 2021-09-19 (×6): 2 g via INTRAVENOUS
  Filled 2021-09-17 (×7): qty 12.5

## 2021-09-17 MED ORDER — TAMSULOSIN HCL 0.4 MG PO CAPS
0.4000 mg | ORAL_CAPSULE | Freq: Every day | ORAL | Status: DC
  Administered 2021-09-18: 0.4 mg via ORAL
  Filled 2021-09-17: qty 1

## 2021-09-17 MED ORDER — LACTATED RINGERS IV BOLUS
1000.0000 mL | Freq: Once | INTRAVENOUS | Status: AC
  Administered 2021-09-17: 1000 mL via INTRAVENOUS

## 2021-09-17 MED ORDER — COLESEVELAM HCL 625 MG PO TABS
1875.0000 mg | ORAL_TABLET | Freq: Every day | ORAL | Status: DC
  Administered 2021-09-17 – 2021-09-18 (×2): 1875 mg via ORAL
  Filled 2021-09-17 (×2): qty 3

## 2021-09-17 MED ORDER — ENOXAPARIN SODIUM 40 MG/0.4ML IJ SOSY
40.0000 mg | PREFILLED_SYRINGE | INTRAMUSCULAR | Status: DC
  Administered 2021-09-17 – 2021-09-18 (×2): 40 mg via SUBCUTANEOUS
  Filled 2021-09-17 (×2): qty 0.4

## 2021-09-17 MED ORDER — INSULIN ASPART 100 UNIT/ML IJ SOLN
0.0000 [IU] | Freq: Three times a day (TID) | INTRAMUSCULAR | Status: DC
  Administered 2021-09-18: 3 [IU] via SUBCUTANEOUS
  Administered 2021-09-18: 2 [IU] via SUBCUTANEOUS
  Administered 2021-09-18: 3 [IU] via SUBCUTANEOUS
  Administered 2021-09-19: 2 [IU] via SUBCUTANEOUS
  Administered 2021-09-19: 3 [IU] via SUBCUTANEOUS

## 2021-09-17 NOTE — Progress Notes (Signed)
Notified bedside nurse of need to draw repeat lactic acid. 

## 2021-09-17 NOTE — ED Notes (Signed)
Report called to [facility]. Floor notified they are on the way

## 2021-09-17 NOTE — ED Triage Notes (Signed)
Patient reports yesterday when he took his sock off he noticed a wound on his foot and he put neosporin on it and did the same this morning. Patient reports he ate a bowl of cereal and and a banana and felt shakey. Patient reports he is diabetic and had a fever of 101

## 2021-09-17 NOTE — Progress Notes (Signed)
Transferred to [facility] from [facility] ED.  Arrived to unit with [facility] Staff, handoff completed with [facility] EMTs.

## 2021-09-17 NOTE — Progress Notes (Signed)
Notified bedside nurse of need to administer antibiotics.  

## 2021-09-17 NOTE — Progress Notes (Signed)
Notified provider and bedside nurse of need to draw repeat lactic acid #3 @ 1850 and needs 3798cc fluid per protocol.Marland Kitchen

## 2021-09-17 NOTE — Progress Notes (Signed)
Notified provider and bedside nurse of need to order and draw repeat lactic acid @ 1700.

## 2021-09-17 NOTE — Progress Notes (Signed)
  Subjective:  Patient ID: [name], male    DOB: 1955/04/16,  MRN: [id number]  Chief Complaint  Patient presents with   Diabetic Ulcer     R open wound - diabetic    66 y.o. male presents with the above complaint. History confirmed with patient.  He presents today to evaluate his wound, he says he felt okay when he woke up this morning but after eating breakfast he became quite feverish and had chills.  At present in the office he needs a blanket to stay warm   Objective:  Physical Exam: warm, good capillary refill, normal DP and PT pulses, and his right foot midfoot ulcer has overlying hyperkeratosis mild periwound erythema there is no purulent drainage no worsening of the wound or exposed subcutaneous bone tendon or joint..   BP 140/80 Pulse 111 bpm Temperature 101.1  Radiographs: Multiple views x-ray of the right foot: His right foot does not show any definitive erosions that I can see on the exam, there is some subcutaneous air but this seems to be consistent and contiguous with the wound Assessment:   1. Ulcer of right midfoot with fat layer exposed (HCC)   2. Type 2 diabetes, controlled, with neuropathy (HCC)   3. Fever, unspecified fever cause      Plan:  Patient was evaluated and treated and all questions answered.  I evaluated the patient personally and along with my partner Dr. Ardelle Anton who knows this patient well.  His wound actually appeared quite benign and had minimal serous drainage there is some mild periwound erythema but no exposed subcutaneous tissue or evidence to suggest active osteomyelitis or worsening diabetic foot infection.  It is possible he is having some other systemic illness that is developing potentially even food poisoning after having cereal with milk.  He denied any other symptoms such as urinary, GI or pulmonary.  I discussed with him that given his appearance I would like him to proceed to the ER to have evaluation there as well as lab work  and potentially further imaging on the foot including an MRI if it is thought that the foot could be the only source of his illness.  We will follow him closely.   No follow-ups on file.

## 2021-09-17 NOTE — ED Provider Notes (Signed)
[facility] EMERGENCY DEPT Provider Note   CSN: [id number] Arrival date & time: 09/17/21  1223     History  Chief Complaint  Patient presents with   Foot Pain    [name] is a 66 y.o. male with history of hypertension, hyperlipidemia, CAD, type 2 diabetes presents today for evaluation of a right foot ulcer that he noted last night with fever and weakness.  Patient has a history of foot ulcers and a called [facility] for an appointment.  When he arrived, they noted that he had a fever of 101.1 F.  They were concerned for sepsis and referred him to the emergency department.  Patient's daughter works at [facility] and accompanies him to the appointment today.  Patient states that he has been feeling weak, feverish, intermittently nauseous and fatigued for approximately 2 days.  He denies cough, congestion, abdominal pain, vomiting and diarrhea.  On chart review, podiatry states that the the wound itself was rather unimpressive, however given his appearance and his fever, they sent him to the emergency department for evaluation.   Foot Pain      Home Medications Prior to Admission medications   Medication Sig Start Date End Date Taking? Authorizing Provider  acetaminophen (TYLENOL) 650 MG CR tablet Take 1,300 mg by mouth at bedtime.    [provider]  aspirin EC 325 MG tablet Take 325 mg by mouth daily.    [provider]  atorvastatin (LIPITOR) 80 MG tablet Take 80 mg by mouth at bedtime.  02/09/13   [provider]  celecoxib (CELEBREX) 200 MG capsule TAKE 1 CAPSULE BY MOUTH EVERY DAY 09/10/21   Vivi Barrack, DPM  Cholecalciferol (VITAMIN D3) 2000 units TABS Take 2,000 Units by mouth daily.    [provider]  doxycycline (VIBRA-TABS) 100 MG tablet Take 1 tablet (100 mg total) by mouth 2 (two) times daily. Patient not taking: Reported on 09/17/2021 02/27/20   Vivi Barrack, DPM  empagliflozin  (JARDIANCE) 25 MG TABS tablet Take 25 mg by mouth daily.    [provider]  glimepiride (AMARYL) 2 MG tablet Take 2 mg by mouth daily. 12/03/20   [provider]  Glucosamine-Chondroitin (COSAMIN DS PO) Take 2 tablets by mouth daily with breakfast.     [provider]  isosorbide mononitrate (IMDUR) 120 MG 24 hr tablet TAKE 1 TABLET BY MOUTH EVERY DAY 04/30/21   Lyn Records, MD  ketoconazole (NIZORAL) 2 % cream  11/23/19   [provider]  lisinopril (PRINIVIL,ZESTRIL) 5 MG tablet Take 5 mg by mouth at bedtime.  07/03/13   [provider]  Menthol, Topical Analgesic, 4 % GEL Apply 1 application topically See admin instructions. Apply to both feet at bedtime for neuropathy    [provider]  methocarbamol (ROBAXIN) 500 MG tablet Take 1 tablet (500 mg total) by mouth 2 (two) times daily as needed for muscle spasms. 08/13/21   Cristie Hem, PA-C  metoprolol succinate (TOPROL-XL) 100 MG 24 hr tablet TAKE 1 TABLET BY MOUTH EVERY DAY WITH OR IMMEDIATELY FOLLOWING A MEAL 01/04/21   Lyn Records, MD  Multiple Vitamin (MULTIVITAMIN) tablet Take 1 tablet by mouth daily.    [provider]  nitroGLYCERIN (NITROSTAT) 0.4 MG SL tablet Place 1 tablet (0.4 mg total) under the tongue every 5 (five) minutes x 3 doses as needed for chest pain. 12/07/20   Lyn Records, MD  [facility]  Peripheral Neuropathy Cream- Bupivacaine 1%, Doxepin 3%, Gabapentin 6%, Pentoxifylline 3%, Topiramate 1% Apply 1-2 grams to affected area 3-4 times daily Qty. 120 gm 3 refills    [provider]  omeprazole (PRILOSEC) 20 MG capsule Take 20 mg by mouth daily.  02/03/13   [provider]  pregabalin (LYRICA) 150 MG capsule Take 1 capsule (150 mg total) by mouth 2 (two) times daily. 08/12/21   McDonald, Rachelle HoraAdam R, DPM  pregabalin (LYRICA) 150 MG capsule Take 1 capsule (150 mg total) by mouth 2 (two) times daily. 09/11/21   Vivi BarrackWagoner, Matthew  R, DPM  senna-docusate (SENOKOT S) 8.6-50 MG tablet Take 1 tablet by mouth at bedtime as needed. 01/29/18   Tarry KosXu, Naiping M, MD  tamsulosin (FLOMAX) 0.4 MG CAPS capsule  11/23/19   [provider]  Tetrahydrozoline HCl (VISINE OP) Place 1 drop into both eyes daily as needed (for dry or irritated eyes).     [provider]  triamterene-hydrochlorothiazide (MAXZIDE) 75-50 MG per tablet Take 0.5 tablets by mouth daily.  01/06/13   [provider]  WELCHOL 625 MG tablet Take 1,875 mg by mouth at bedtime.  05/19/13   [provider]      Allergies    Metformin and Penicillins    Review of Systems   Review of Systems  Physical Exam Updated Vital Signs BP 110/69 (BP Location: Right Arm)   Pulse 99   Temp 99.6 F (37.6 C)   Resp 18   SpO2 93%  Physical Exam Vitals and nursing note reviewed.  Constitutional:      General: He is not in acute distress.    Appearance: He is ill-appearing.     Comments: Patient is tired, falling asleep throughout exam  HENT:     Head: Atraumatic.  Eyes:     Conjunctiva/sclera: Conjunctivae normal.  Cardiovascular:     Rate and Rhythm: Normal rate and regular rhythm.     Pulses: Normal pulses.     Heart sounds: No murmur heard. Pulmonary:     Effort: Pulmonary effort is normal. No respiratory distress.     Breath sounds: Normal breath sounds.     Comments: Speaking in full and complete sentences Abdominal:     General: Abdomen is flat. There is no distension.     Palpations: Abdomen is soft.     Tenderness: There is no abdominal tenderness.  Musculoskeletal:        General: Normal range of motion.     Cervical back: Normal range of motion.  Skin:    General: Skin is warm and dry.     Capillary Refill: Capillary refill takes less than 2 seconds.     Comments: Ulcer on the bottom of right foot, medially.  Picture below  Neurological:     General: No focal deficit present.     Mental Status: He is alert.   Psychiatric:        Mood and Affect: Mood normal.     ED Results / Procedures / Treatments   Labs (all labs ordered are listed, but only abnormal results are displayed) Labs Reviewed  LACTIC ACID, PLASMA  LACTIC ACID, PLASMA  COMPREHENSIVE METABOLIC PANEL  CBC WITH DIFFERENTIAL/PLATELET    EKG None  Radiology No results found.  Procedures Procedures    Medications Ordered in ED Medications - No data to display  ED Course/ Medical Decision Making/ A&P  Medical Decision Making Amount and/or Complexity of Data Reviewed Labs: ordered. Radiology: ordered.  Risk OTC drugs. Prescription drug management. Decision regarding hospitalization.   Social determinants of health:  Social History   Socioeconomic History   Marital status: Widowed    Spouse name: Not on file   Number of children: Not on file   Years of education: Not on file   Highest education level: Not on file  Occupational History   Not on file  Tobacco Use   Smoking status: Never    Passive exposure: Never   Smokeless tobacco: Never  Vaping Use   Vaping Use: Never used  Substance and Sexual Activity   Alcohol use: Yes    Comment: 01/29/2018 "may have 1 beer/month; if that"   Drug use: Never   Sexual activity: Not Currently  Other Topics Concern   Not on file  Social History Narrative   Not on file   Social Determinants of Health   Financial Resource Strain: Not on file  Food Insecurity: Not on file  Transportation Needs: Not on file  Physical Activity: Not on file  Stress: Not on file  Social Connections: Not on file  Intimate Partner Violence: Not on file     Initial impression:  This patient presents to the ED for concern of fevers, fatigue and new foot ulcer on the right foot, this involves an extensive number of treatment options, and is a complaint that carries with it a high risk of complications and morbidity.   Differentials include sepsis,  osteomyelitis.   Comorbidities affecting care:  CAD, hypertension  Additional history obtained: Daughter at bedside  Lab Tests  I Ordered, reviewed, and interpreted labs and EKG.  The pertinent results include:  CBC with leukocytosis of 14.6 CMP unremarkable UA with glucose over thousand Lactic acid significantly elevated at 3.6  Imaging Studies ordered:  I ordered imaging studies including  Chest x-ray without acute findings I independently visualized and interpreted imaging and I agree with the radiologist interpretation.     Cardiac Monitoring:  The patient was maintained on a cardiac monitor.  I personally viewed and interpreted the cardiac monitored which showed an underlying rhythm of: Normal sinus rhythm   Medicines ordered and prescription drug management:  I ordered medication including: 1 L lactated Ringer's initially, and then additional 1 L Vancomycin and 2 g Rocephin Reevaluation of the patient after these medicines showed that the patient stayed the same I have reviewed the patients home medicines and have made adjustments as needed   Consultations Obtained:  I requested consultation with Dr. Hanley Ben,  and discussed lab and imaging findings as well as pertinent plan - they recommend: Admission   ED Course/Re-evaluation: 66 year old male presents to the ED for evaluation of fevers, fatigue with new diabetic foot ulcer.  Patient is ill-appearing although nontoxic.  He is tired throughout the exam, falling asleep.  He is warm to touch, and it was noted that his temperature increased from 99.6 to 100.3 F.  He was given 650 mg Tylenol.  He does have an ulcer on the right midfoot without purulent drainage or bone exposure.  Remainder of physical exam was otherwise benign.  His labs returned with leukocytosis and lactic acid of 3.6 and code sepsis was activated.  Rest of his labs were overall unremarkable and chest x-ray was negative.  In the absence of other source  of infection, I suspect that patient has osteomyelitis and empiric treatment was started with vancomycin and 2 g  Rocephin.   I called the hospitalist team and spoke with Dr. Hanley Ben  who agrees to admit patient. Disposition:  After consideration of the diagnostic results, physical exam, history and the patients response to treatment feel that the patent would benefit from admission.   Sepsis   Final Clinical Impression(s) / ED Diagnoses Final diagnoses:  None    Rx / DC Orders ED Discharge Orders     None         Janell Quiet, PA-C 09/17/21 1704    Jacalyn Lefevre, MD 09/18/21 [phone]

## 2021-09-17 NOTE — Progress Notes (Signed)
[facility] following code sepsis °

## 2021-09-17 NOTE — Progress Notes (Signed)
Notified bedside nurse of need to draw repeat lactic acid #3 before pt is transferred to [facility] .

## 2021-09-17 NOTE — H&P (Signed)
History and Physical   RANDALL WEIGOLD [id number] DOB: 09/26/1955 DOA: 09/17/2021  PCP: Lajean Manes, MD   Patient coming from: Home  Chief Complaint: Diabetic foot wound, fever  HPI: [name] is a 66 y.o. male with medical history significant of CAD, hypertension, hyperlipidemia, obesity, OSA, gout, GERD, neuropathy, diabetes presenting with chronic diabetic foot ulcer and fever.  Patient has known chronic diabetic foot ulcer and was seen in podiatry clinic earlier today.  He was noted to feel feverish and feeling some chills starting this morning after having breakfast.  Also reporting some weakness and intermittent nausea for 2 days.  As above was evaluated in podiatry clinic today where he is noted to have his known wound with serous drainage however noted to have fever as high of one 1.1 and above symptoms with concern for possible sepsis was sent to the ED for further evaluation.  Patient denies chest pain, shortness of breath, abdominal pain, constipation, diarrhea.  ED Course: Vital signs in the ED significant for blood pressure in the 0000000 to 123XX123 systolic.  No fever measured in the ED but fever at podiatry clinic was 1-1.1.  Lab work-up included CMP with BUN 24 and glucose 161.  CBC with leukocytosis to 14.6 and platelets of 118.  Lactic acid elevated at 3.6 and then 4.3 on repeat.  COVID screen negative.  Urinalysis with glucose.  Blood cultures pending.  Chest x-ray showed no acute abnormality.  Right foot x-ray was done at podiatry office discussed with podiatry provider.  Patient received vancomycin and ceftriaxone in the ED as well as 3 L of IV fluids.  Review of Systems: As per HPI otherwise all other systems reviewed and are negative.  Past Medical History:  Diagnosis Date   Arthritis    "ankles, knees" (01/29/2018)   CAD in native artery    cath report from 02/2017 indicates patient had a history of RCA stent >10 years prior to that. At time of that cath he was  found to have EF 70%, 20-30% ostial LAD, 30-40% prox LAD, 30-40% Cx, 80-90% Cx beyond OM1, totally occluded RCA with left-to-right collaterals.   Diabetes mellitus type 2 in obese (HCC)    GERD (gastroesophageal reflux disease)    Hyperlipidemia    Hypertension    Morbid obesity ([place])    OSA on CPAP     Past Surgical History:  Procedure Laterality Date   CARDIAC CATHETERIZATION  2008   CORONARY ANGIOPLASTY WITH STENT PLACEMENT  1997   JOINT REPLACEMENT     KNEE ARTHROSCOPY Bilateral    "meniscus repairs"   TONSILLECTOMY     TOTAL KNEE ARTHROPLASTY Left 01/29/2018   TOTAL KNEE ARTHROPLASTY Left 01/29/2018   Procedure: LEFT TOTAL KNEE ARTHROPLASTY;  Surgeon: Leandrew Koyanagi, MD;  Location: [facility];  Service: Orthopedics;  Laterality: Left;   UVULOPALATOPHARYNGOPLASTY  ~ 1994    Social History  reports that he has never smoked. He has never been exposed to tobacco smoke. He has never used smokeless tobacco. He reports current alcohol use. He reports that he does not use drugs.  Allergies  Allergen Reactions   Metformin Other (See Comments)   Penicillins Rash    Has patient had a PCN reaction causing immediate rash, facial/tongue/throat swelling, SOB or lightheadedness with hypotension: Yes Has patient had a PCN reaction causing severe rash involving mucus membranes or skin necrosis: Unk Has patient had a PCN reaction that required hospitalization: Unk Has patient had a PCN reaction occurring  within the last 10 years: No If all of the above answers are "NO", then may proceed with Cephalosporin use.     Family History  Problem Relation Age of Onset   Heart attack Mother    Heart attack Father   Reviewed on admission  Prior to Admission medications   Medication Sig Start Date End Date Taking? Authorizing Provider  acetaminophen (TYLENOL) 650 MG CR tablet Take 1,300 mg by mouth at bedtime.   Yes [provider]  atorvastatin (LIPITOR) 80 MG tablet Take 80 mg by mouth at  bedtime.  02/09/13  Yes [provider]  celecoxib (CELEBREX) 200 MG capsule TAKE 1 CAPSULE BY MOUTH EVERY DAY 09/10/21  Yes Trula Slade, DPM  Cholecalciferol (VITAMIN D3) 2000 units TABS Take 2,000 Units by mouth daily.   Yes [provider]  empagliflozin (JARDIANCE) 25 MG TABS tablet Take 25 mg by mouth daily.   Yes [provider]  glimepiride (AMARYL) 2 MG tablet Take 2 mg by mouth daily. 12/03/20  Yes [provider]  Glucosamine-Chondroitin (COSAMIN DS PO) Take 2 tablets by mouth daily with breakfast.    Yes [provider]  isosorbide mononitrate (IMDUR) 120 MG 24 hr tablet TAKE 1 TABLET BY MOUTH EVERY DAY 04/30/21  Yes Belva Crome, MD  lisinopril (PRINIVIL,ZESTRIL) 5 MG tablet Take 5 mg by mouth at bedtime.  07/03/13  Yes [provider]  Menthol, Topical Analgesic, 4 % GEL Apply 1 application topically See admin instructions. Apply to both feet at bedtime for neuropathy   Yes [provider]  metoprolol succinate (TOPROL-XL) 100 MG 24 hr tablet TAKE 1 TABLET BY MOUTH EVERY DAY WITH OR IMMEDIATELY FOLLOWING A MEAL 01/04/21  Yes Belva Crome, MD  Multiple Vitamin (MULTIVITAMIN) tablet Take 1 tablet by mouth daily.   Yes [provider]  omeprazole (PRILOSEC) 20 MG capsule Take 20 mg by mouth daily.  02/03/13  Yes [provider]  pregabalin (LYRICA) 150 MG capsule Take 1 capsule (150 mg total) by mouth 2 (two) times daily. 09/11/21  Yes Trula Slade, DPM  tamsulosin (FLOMAX) 0.4 MG CAPS capsule  11/23/19  Yes [provider]  triamterene-hydrochlorothiazide (MAXZIDE) 75-50 MG per tablet Take 0.5 tablets by mouth daily.  01/06/13  Yes [provider]  WELCHOL 625 MG tablet Take 1,875 mg by mouth at bedtime.  05/19/13  Yes [provider]  aspirin EC 325 MG tablet Take 325 mg by mouth daily.    [provider]  nitroGLYCERIN (NITROSTAT) 0.4 MG SL tablet Place 1 tablet  (0.4 mg total) under the tongue every 5 (five) minutes x 3 doses as needed for chest pain. 12/07/20   Belva Crome, MD  NON [facility]  Peripheral Neuropathy Cream- Bupivacaine 1%, Doxepin 3%, Gabapentin 6%, Pentoxifylline 3%, Topiramate 1% Apply 1-2 grams to affected area 3-4 times daily Qty. 120 gm 3 refills    [provider]  senna-docusate (SENOKOT S) 8.6-50 MG tablet Take 1 tablet by mouth at bedtime as needed. 01/29/18   Leandrew Koyanagi, MD  Tetrahydrozoline HCl (VISINE OP) Place 1 drop into both eyes daily as needed (for dry or irritated eyes).     [provider]    Physical Exam: Vitals:   09/17/21 1615 09/17/21 1744 09/17/21 1745 09/17/21 1850  BP: (!) 92/51  (!) 109/55 (!) 105/47  Pulse: 84  84 77  Resp: 19  18 (!) 21  Temp:  100 F (  37.8 C)  99 F (37.2 C)  TempSrc:  Oral  Oral  SpO2: 94%  92% 95%    Physical Exam Constitutional:      General: He is not in acute distress.    Appearance: Normal appearance. He is obese.  HENT:     Head: Normocephalic and atraumatic.     Mouth/Throat:     Mouth: Mucous membranes are moist.     Pharynx: Oropharynx is clear.  Eyes:     Extraocular Movements: Extraocular movements intact.     Pupils: Pupils are equal, round, and reactive to light.  Cardiovascular:     Rate and Rhythm: Normal rate and regular rhythm.     Pulses: Normal pulses.     Heart sounds: Normal heart sounds.  Pulmonary:     Effort: Pulmonary effort is normal. No respiratory distress.     Breath sounds: Normal breath sounds.  Abdominal:     General: Bowel sounds are normal. There is no distension.     Palpations: Abdomen is soft.     Tenderness: There is no abdominal tenderness.  Musculoskeletal:        General: No swelling or deformity.     Comments: Right diabetic foot wound with serosanguineous drainage noted.  Skin:    General: Skin is warm and dry.  Neurological:     General: No focal deficit present.     Mental  Status: Mental status is at baseline.    Labs on Admission: I have personally reviewed following labs and imaging studies  CBC: Recent Labs  Lab 09/17/21 1400  WBC 14.6*  NEUTROABS 12.8*  HGB 15.6  HCT 47.5  MCV 89.8  PLT 118*    Basic Metabolic Panel: Recent Labs  Lab 09/17/21 1400  NA 137  K 4.5  CL 102  CO2 22  GLUCOSE 161*  BUN 24*  CREATININE 1.08  CALCIUM 9.5    GFR: CrCl cannot be calculated (Unknown ideal weight.).  Liver Function Tests: Recent Labs  Lab 09/17/21 1400  [name] 25  ALT 25  ALKPHOS 70  BILITOT 0.9  PROT 6.9  ALBUMIN 3.9    Urine analysis:    Component Value Date/Time   COLORURINE YELLOW 09/17/2021 [place] 09/17/2021 1437   LABSPEC 1.022 09/17/2021 1437   PHURINE 5.5 09/17/2021 1437   GLUCOSEU >1,000 (A) 09/17/2021 1437   HGBUR NEGATIVE 09/17/2021 1437   [place] NEGATIVE 09/17/2021 1437   [place] 09/17/2021 1437   PROTEINUR NEGATIVE 09/17/2021 1437   NITRITE NEGATIVE 09/17/2021 1437   LEUKOCYTESUR NEGATIVE 09/17/2021 1437    Radiological Exams on Admission: DG Chest Portable 1 View  Result Date: 09/17/2021 CLINICAL DATA:  Fever EXAM: PORTABLE CHEST 1 VIEW COMPARISON:  02/07/2018 FINDINGS: Transverse diameter of heart is slightly increased. There are no signs of pulmonary edema or focal pulmonary consolidation. There is no pleural effusion or pneumothorax. IMPRESSION: No active disease. Electronically Signed   By: Elmer Picker M.D.   On: 09/17/2021 15:37    EKG: Not yet performed  Assessment/Plan Principal Problem:   Sepsis ([place]) Active Problems:   CAD (coronary artery disease), native coronary artery   Essential hypertension   Hyperlipidemia   Morbid obesity ([place])   Obstructive sleep apnea   Gastroesophageal reflux disease   Peripheral neuropathy   Polyneuropathy due to type 2 diabetes mellitus ([place])   DM (diabetes mellitus) ([place])   Diabetic foot infection ([place])    Sepsis Diabetic foot wound Rule out osteomyelitis >  Patient presenting after being referred from podiatrist office with fever and chills and fatigue in the setting of known diabetic foot wound.  Wound did not look particularly bad with only serous drainage noted in office. > In the ED patient noted to have leukocytosis to 14.6 and was febrile at podiatrist office.  Blood pressure in the 0000000 to 123XX123 systolic which is below patient baseline and lactic acid elevated to 3.6 and then 4.3 on repeat.  Patient meets criteria for sepsis. > Presumed source is diabetic foot wound despite wound X currently not appearing to severe, urinalysis without evidence of infection, chest x-ray clear, blood cultures pending.  He does report symptoms started after having cereal this morning though timeline does not fit with any food poisoning and no GI symptoms. > X-ray podiatry office did not show any obvious signs of osteomyelitis, will benefit from MRI for closer evaluation. > Patient received vancomycin and ceftriaxone as well as 3 L of fluids in the ED. > Podiatry consulted and will see the patient tomorrow. - Appreciate podiatry assistance with this patient - Monitor on telemetry - We will alter antibiotic regimen to vancomycin, cefepime, Flagyl for diabetic foot infection with sepsis - We will add additional liter of IV fluids to complete full bolus for sepsis - Continue with IV fluids at 125 cc an hour - Trend lactic acid - Check procalcitonin - AM cortisol - Trend fever curve and leukocytosis - MRI right foot with contrast  CAD Hyperlipidemia Hypertension - Continue home Imdur - Continue home aspirin and atorvastatin and WelChol - Holding home lisinopril, metoprolol, triamterene-hydrochlorothiazide in the setting of low normal blood pressure and presumed early sepsis  OSA - Continue home CPAP  Diabetes - SSI  GERD - Continue PPI  Neuropathy - Continue home pregabalin  Obesity -  Noted  DVT prophylaxis: Lovenox Code Status:   Full Family Communication:  Caregiver updated at bedside. Disposition Plan:   Patient is from:  Home  Anticipated DC to:  Home  Anticipated DC date:  1 to 4 days  Anticipated DC barriers: None  Consults called:  Podiatry, will see patient in the morning. Admission status:  Observation, telemetry  Severity of Illness: The appropriate patient status for this patient is OBSERVATION. Observation status is judged to be reasonable and necessary in order to provide the required intensity of service to ensure the patient's safety. The patient's presenting symptoms, physical exam findings, and initial radiographic and laboratory data in the context of their medical condition is felt to place them at decreased risk for further clinical deterioration. Furthermore, it is anticipated that the patient will be medically stable for discharge from the hospital within 2 midnights of admission.    Marcelyn Bruins MD [facility]  How to contact the [facility] Attending or Consulting provider [place] or covering provider during after hours [place], for this patient?   Check the care team in [facility] and look for a) attending/consulting TR[facility] provider listed and b) the [facility] team listed Log into [email or web address] and use [place]'s universal password to access. If you do not have the password, please contact the hospital operator. Locate the [facility] provider you are looking for under [facility] and page to a number that you can be directly reached. If you still have difficulty reaching the provider, please page the [facility] (Director on Call) for the Hospitalists listed on amion for assistance.  09/17/2021, 7:19 PM

## 2021-09-17 NOTE — Progress Notes (Signed)
Pharmacy Antibiotic Note  [name] is a 66 y.o. male admitted on 09/17/2021 with sepsis. Patient with a history of foot ulcers, with a new ulcer recently identified. Patient febrile, weak, intermittently nauseous and fatigued. Concern for osteomyelitis. Pharmacy has been consulted for vancomycin dosing.  5/5: Scr 1.08 (BL~1), WBC 14.6, LA 3.6, Tm 100.3  Plan: Give Vancomycin 2,000 mg IV x1 (~16 mg/kg- based on 270 lbs (~123 kg) from flowsheet data) START Vancomycin 1,750 mg IV Q24H (Scr used 1.08, eAUC 480, Vd used 0.5)  Monitor renal function, clinical status, de-escalation, levels as indicated     Temp (24hrs), Avg:100 F (37.8 C), Min:99.6 F (37.6 C), Max:100.3 F (37.9 C)  Recent Labs  Lab 09/17/21 1400  WBC 14.6*  CREATININE 1.08  LATICACIDVEN 3.6*    CrCl cannot be calculated (Unknown ideal weight.).    Allergies  Allergen Reactions   Metformin Other (See Comments)   Penicillins Rash    Has patient had a PCN reaction causing immediate rash, facial/tongue/throat swelling, SOB or lightheadedness with hypotension: Yes Has patient had a PCN reaction causing severe rash involving mucus membranes or skin necrosis: Unk Has patient had a PCN reaction that required hospitalization: Unk Has patient had a PCN reaction occurring within the last 10 years: No If all of the above answers are "NO", then may proceed with Cephalosporin use.     Antimicrobials this admission: Vancomycin 6/6  Dose adjustments this admission: N/A  Microbiology results: 6/6 BCx: sent   Adria Dill, PharmD PGY-1 Acute Care Resident  09/17/2021 3:57 PM

## 2021-09-18 ENCOUNTER — Encounter: Payer: Self-pay | Admitting: *Deleted

## 2021-09-18 ENCOUNTER — Observation Stay (HOSPITAL_COMMUNITY): Payer: Medicare PPO

## 2021-09-18 DIAGNOSIS — Z8249 Family history of ischemic heart disease and other diseases of the circulatory system: Secondary | ICD-10-CM | POA: Diagnosis not present

## 2021-09-18 DIAGNOSIS — E785 Hyperlipidemia, unspecified: Secondary | ICD-10-CM | POA: Diagnosis present

## 2021-09-18 DIAGNOSIS — G4733 Obstructive sleep apnea (adult) (pediatric): Secondary | ICD-10-CM | POA: Diagnosis present

## 2021-09-18 DIAGNOSIS — M109 Gout, unspecified: Secondary | ICD-10-CM | POA: Diagnosis present

## 2021-09-18 DIAGNOSIS — A419 Sepsis, unspecified organism: Secondary | ICD-10-CM | POA: Diagnosis present

## 2021-09-18 DIAGNOSIS — Z880 Allergy status to penicillin: Secondary | ICD-10-CM | POA: Diagnosis not present

## 2021-09-18 DIAGNOSIS — K219 Gastro-esophageal reflux disease without esophagitis: Secondary | ICD-10-CM | POA: Diagnosis present

## 2021-09-18 DIAGNOSIS — Z79899 Other long term (current) drug therapy: Secondary | ICD-10-CM | POA: Diagnosis not present

## 2021-09-18 DIAGNOSIS — Z96652 Presence of left artificial knee joint: Secondary | ICD-10-CM | POA: Diagnosis present

## 2021-09-18 DIAGNOSIS — Z20822 Contact with and (suspected) exposure to covid-19: Secondary | ICD-10-CM | POA: Diagnosis present

## 2021-09-18 DIAGNOSIS — Z7982 Long term (current) use of aspirin: Secondary | ICD-10-CM | POA: Diagnosis not present

## 2021-09-18 DIAGNOSIS — M7989 Other specified soft tissue disorders: Secondary | ICD-10-CM | POA: Diagnosis not present

## 2021-09-18 DIAGNOSIS — E11628 Type 2 diabetes mellitus with other skin complications: Secondary | ICD-10-CM | POA: Diagnosis present

## 2021-09-18 DIAGNOSIS — E11621 Type 2 diabetes mellitus with foot ulcer: Secondary | ICD-10-CM | POA: Diagnosis not present

## 2021-09-18 DIAGNOSIS — E11610 Type 2 diabetes mellitus with diabetic neuropathic arthropathy: Secondary | ICD-10-CM | POA: Diagnosis present

## 2021-09-18 DIAGNOSIS — L97519 Non-pressure chronic ulcer of other part of right foot with unspecified severity: Secondary | ICD-10-CM | POA: Diagnosis present

## 2021-09-18 DIAGNOSIS — E1140 Type 2 diabetes mellitus with diabetic neuropathy, unspecified: Secondary | ICD-10-CM | POA: Diagnosis present

## 2021-09-18 DIAGNOSIS — Z888 Allergy status to other drugs, medicaments and biological substances status: Secondary | ICD-10-CM | POA: Diagnosis not present

## 2021-09-18 DIAGNOSIS — G629 Polyneuropathy, unspecified: Secondary | ICD-10-CM | POA: Diagnosis not present

## 2021-09-18 DIAGNOSIS — I10 Essential (primary) hypertension: Secondary | ICD-10-CM | POA: Diagnosis present

## 2021-09-18 DIAGNOSIS — I2510 Atherosclerotic heart disease of native coronary artery without angina pectoris: Secondary | ICD-10-CM | POA: Diagnosis present

## 2021-09-18 DIAGNOSIS — L089 Local infection of the skin and subcutaneous tissue, unspecified: Secondary | ICD-10-CM | POA: Diagnosis present

## 2021-09-18 DIAGNOSIS — Z872 Personal history of diseases of the skin and subcutaneous tissue: Secondary | ICD-10-CM | POA: Diagnosis not present

## 2021-09-18 DIAGNOSIS — Z955 Presence of coronary angioplasty implant and graft: Secondary | ICD-10-CM | POA: Diagnosis not present

## 2021-09-18 DIAGNOSIS — Z7984 Long term (current) use of oral hypoglycemic drugs: Secondary | ICD-10-CM | POA: Diagnosis not present

## 2021-09-18 DIAGNOSIS — M1711 Unilateral primary osteoarthritis, right knee: Secondary | ICD-10-CM | POA: Diagnosis present

## 2021-09-18 LAB — GLUCOSE, CAPILLARY
Glucose-Capillary: 124 mg/dL — ABNORMAL HIGH (ref 70–99)
Glucose-Capillary: 128 mg/dL — ABNORMAL HIGH (ref 70–99)
Glucose-Capillary: 164 mg/dL — ABNORMAL HIGH (ref 70–99)
Glucose-Capillary: 173 mg/dL — ABNORMAL HIGH (ref 70–99)
Glucose-Capillary: 199 mg/dL — ABNORMAL HIGH (ref 70–99)

## 2021-09-18 LAB — COMPREHENSIVE METABOLIC PANEL
ALT: 25 U/L (ref 0–44)
AST: 21 U/L (ref 15–41)
Albumin: 3.3 g/dL — ABNORMAL LOW (ref 3.5–5.0)
Alkaline Phosphatase: 54 U/L (ref 38–126)
Anion gap: 10 (ref 5–15)
BUN: 22 mg/dL (ref 8–23)
CO2: 25 mmol/L (ref 22–32)
Calcium: 8.9 mg/dL (ref 8.9–10.3)
Chloride: 104 mmol/L (ref 98–111)
Creatinine, Ser: 0.99 mg/dL (ref 0.61–1.24)
GFR, Estimated: >60 mL/min (ref >60)
Glucose, Bld: 113 mg/dL — ABNORMAL HIGH (ref 70–99)
Potassium: 3.7 mmol/L (ref 3.5–5.1)
Sodium: 139 mmol/L (ref 135–145)
Total Bilirubin: 0.8 mg/dL (ref 0.3–1.2)
Total Protein: 6.3 g/dL — ABNORMAL LOW (ref 6.5–8.1)

## 2021-09-18 LAB — CBC
HCT: 43.6 % (ref 39.0–52.0)
Hemoglobin: 14.3 g/dL (ref 13.0–17.0)
MCH: 29.9 pg (ref 26.0–34.0)
MCHC: 32.8 g/dL (ref 30.0–36.0)
MCV: 91 fL (ref 80.0–100.0)
Platelets: 121 10*3/uL — ABNORMAL LOW (ref 150–400)
RBC: 4.79 MIL/uL (ref 4.22–5.81)
RDW: 15.5 % (ref 11.5–15.5)
WBC: 7.1 10*3/uL (ref 4.0–10.5)
nRBC: 0 % (ref 0.0–0.2)

## 2021-09-18 LAB — CORTISOL-AM, BLOOD: Cortisol - AM: 19.4 ug/dL (ref 6.7–22.6)

## 2021-09-18 LAB — PROTIME-INR
INR: 1.1 (ref 0.8–1.2)
Prothrombin Time: 14 seconds (ref 11.4–15.2)

## 2021-09-18 LAB — PROCALCITONIN: Procalcitonin: 0.6 ng/mL

## 2021-09-18 LAB — HIV ANTIBODY (ROUTINE TESTING W REFLEX): HIV Screen 4th Generation wRfx: NONREACTIVE

## 2021-09-18 MED ORDER — GADOBUTROL 1 MMOL/ML IV SOLN
10.0000 mL | Freq: Once | INTRAVENOUS | Status: AC | PRN
  Administered 2021-09-18: 10 mL via INTRAVENOUS

## 2021-09-18 NOTE — Congregational Nurse Program (Signed)
[id number] [name] in the hospital for which he was admitted for a diabetic foot ulcer.  Stated that with his recently deceased wife and her care was up on his feet for long periods of time.  [name] formation opened and turned into an ulcer. Does state that MRI was neg. For bone involvement.  On iv abx and has orthopedic and podiatry md following.  Has no needs at this time.

## 2021-09-18 NOTE — Progress Notes (Signed)
  Transition of Care [facility]) Screening Note   Patient Details  Name: [name] Date of Birth: Aug 30, 1955   Transition of Care [facility]) CM/SW Contact:    Otelia Santee, LCSW Phone Number: 09/18/2021, 1:01 PM    Transition of Care Department [facility]) has reviewed patient and no TOC needs have been identified at this time. We will continue to monitor patient advancement through interdisciplinary progression rounds. If new patient transition needs arise, please place a TOC consult.

## 2021-09-18 NOTE — Progress Notes (Signed)
Pt placed on cpap and continuous Pulse OX study started

## 2021-09-18 NOTE — Consult Note (Signed)
WOC Nurse Consult Note: Consult requested for right foot wound.  Progress notes reviewed and they indicate that the podiatry team will be following for assessment and plan of care.  Please refer to their team for further questions regarding plan of care. Please re-consult if further assistance is needed.  Thank-you,  [name] Mcgee MSN, RN, CWOCN, [place], CNS [phone]

## 2021-09-18 NOTE — Consult Note (Signed)
Reason for Consult:Dr. Hartley Barefoot, MD Referring Physician: Infection  [name] is an 66 y.o. male.  HPI: 66 year old male with past medical history significant for CAD, type 2 diabetes, hypertension, hyperlipidemia, OSA, obesity presented to the podiatry clinic yesterday.  He states that he had taken his compression socks off the night before noticed a wound.  He felt fine the night before and yesterday morning he started having fevers and chills and came to the office for follow-up with Dr. [name].  Patient states that he has been on his feet more as his wife recently passed away and also his hip and his back has been hurting so he has been standing more.  Given concern of infection he was sent to the hospital for admission and found to be septic.  MRI performed.  He states he is feeling better today.  Past Medical History:  Diagnosis Date   Arthritis    "ankles, knees" (01/29/2018)   CAD in native artery    cath report from 02/2017 indicates patient had a history of RCA stent >10 years prior to that. At time of that cath he was found to have EF 70%, 20-30% ostial LAD, 30-40% prox LAD, 30-40% Cx, 80-90% Cx beyond OM1, totally occluded RCA with left-to-right collaterals.   Diabetes mellitus type 2 in obese (HCC)    GERD (gastroesophageal reflux disease)    Hyperlipidemia    Hypertension    Morbid obesity (HCC)    OSA on CPAP     Past Surgical History:  Procedure Laterality Date   CARDIAC CATHETERIZATION  2008   CORONARY ANGIOPLASTY WITH STENT PLACEMENT  1997   JOINT REPLACEMENT     KNEE ARTHROSCOPY Bilateral    "meniscus repairs"   TONSILLECTOMY     TOTAL KNEE ARTHROPLASTY Left 01/29/2018   TOTAL KNEE ARTHROPLASTY Left 01/29/2018   Procedure: LEFT TOTAL KNEE ARTHROPLASTY;  Surgeon: Tarry Kos, MD;  Location: [facility] OR;  Service: Orthopedics;  Laterality: Left;   UVULOPALATOPHARYNGOPLASTY  ~ 1994    Family History  Problem Relation Age of Onset   Heart attack Mother     Heart attack Father     Social History:  reports that he has never smoked. He has never been exposed to tobacco smoke. He has never used smokeless tobacco. He reports current alcohol use. He reports that he does not use drugs.  Allergies:  Allergies  Allergen Reactions   Metformin Other (See Comments)   Penicillins Rash    Has patient had a PCN reaction causing immediate rash, facial/tongue/throat swelling, SOB or lightheadedness with hypotension: Yes Has patient had a PCN reaction causing severe rash involving mucus membranes or skin necrosis: Unk Has patient had a PCN reaction that required hospitalization: Unk Has patient had a PCN reaction occurring within the last 10 years: No If all of the above answers are "NO", then may proceed with Cephalosporin use.     Medications: I have reviewed the patient's current medications.  Results for orders placed or performed during the hospital encounter of 09/17/21 (from the past 48 hour(s))  Lactic acid, plasma     Status: Abnormal   Collection Time: 09/17/21  2:00 PM  Result Value Ref Range   Lactic Acid, Venous 3.6 (HH) 0.5 - 1.9 mmol/L    Comment: CRITICAL RESULT CALLED TO, READ BACK BY AND VERIFIED WITH: FOUNTAIN,M,RN @ 1439 09/17/21 BY GWYN,P Performed at [facility], [address], [place], [place] [address]   Comprehensive metabolic panel  Status: Abnormal   Collection Time: 09/17/21  2:00 PM  Result Value Ref Range   Sodium 137 135 - 145 mmol/L   Potassium 4.5 3.5 - 5.1 mmol/L   Chloride 102 98 - 111 mmol/L   CO2 22 22 - 32 mmol/L   Glucose, Bld 161 (H) 70 - 99 mg/dL    Comment: Glucose reference range applies only to samples taken after fasting for at least 8 hours.   BUN 24 (H) 8 - 23 mg/dL   Creatinine, Ser 3.71 0.61 - 1.24 mg/dL   Calcium 9.5 8.9 - 06.2 mg/dL   Total Protein 6.9 6.5 - 8.1 g/dL   Albumin 3.9 3.5 - 5.0 g/dL   [name] 25 15 - 41 U/L   ALT 25 0 - 44 U/L   Alkaline Phosphatase 70 38 -  126 U/L   Total Bilirubin 0.9 0.3 - 1.2 mg/dL   GFR, Estimated >69 >48 mL/min    Comment: (NOTE) Calculated using the CKD-EPI Creatinine Equation (2021)    Anion gap 13 5 - 15    Comment: Performed at [facility], [address] [place], [place] [address]  CBC with Differential     Status: Abnormal   Collection Time: 09/17/21  2:00 PM  Result Value Ref Range   WBC 14.6 (H) 4.0 - 10.5 K/uL   RBC 5.29 4.22 - 5.81 MIL/uL   Hemoglobin 15.6 13.0 - 17.0 g/dL   HCT 03.5 00.9 - 38.1 %   MCV 89.8 80.0 - 100.0 fL   MCH 29.5 26.0 - 34.0 pg   MCHC 32.8 30.0 - 36.0 g/dL   RDW 82.9 93.7 - 16.9 %   Platelets 118 (L) 150 - 400 K/uL    Comment: Immature Platelet Fraction may be clinically indicated, consider ordering this additional test [id number] PLATELET COUNT CONFIRMED BY SMEAR PLATELETS APPEAR DECREASED    nRBC 0.0 0.0 - 0.2 %   Neutrophils Relative % 88 %   Neutro Abs 12.8 (H) 1.7 - 7.7 K/uL   Lymphocytes Relative 5 %   Lymphs Abs 0.7 0.7 - 4.0 K/uL   Monocytes Relative 6 %   Monocytes Absolute 0.9 0.1 - 1.0 K/uL   Eosinophils Relative 0 %   Eosinophils Absolute 0.0 0.0 - 0.5 K/uL   Basophils Relative 0 %   Basophils Absolute 0.1 0.0 - 0.1 K/uL   RBC Morphology MORPHOLOGY UNREMARKABLE    Immature Granulocytes 1 %   Abs Immature Granulocytes 0.07 0.00 - 0.07 K/uL   Abnormal Lymphocytes Present PRESENT     Comment: Performed at [facility], [address] [place], [place] [address]  Urinalysis, Routine w reflex microscopic Urine, Clean Catch     Status: Abnormal   Collection Time: 09/17/21  2:37 PM  Result Value Ref Range   Color, Urine YELLOW YELLOW   APPearance CLEAR CLEAR   Specific Gravity, Urine 1.022 1.005 - 1.030   pH 5.5 5.0 - 8.0   Glucose, UA >1,000 (A) NEGATIVE mg/dL   Hgb urine dipstick NEGATIVE NEGATIVE   Bilirubin Urine NEGATIVE NEGATIVE   Ketones, ur NEGATIVE NEGATIVE mg/dL   Protein, ur NEGATIVE NEGATIVE mg/dL    Nitrite NEGATIVE NEGATIVE   Leukocytes,Ua NEGATIVE NEGATIVE   WBC, UA 0-5 0 - 5 WBC/hpf   Squamous Epithelial / LPF 0-5 0 - 5    Comment: Performed at [facility], [address] [place], [place], [place] [address]  Culture, blood (routine x 2)     Status: None (  Preliminary result)   Collection Time: 09/17/21  3:40 PM   Specimen: BLOOD  Result Value Ref Range   Specimen Description      BLOOD RIGHT ANTECUBITAL Performed at [facility], [address] [place], [place] [address]    Special Requests      BOTTLES DRAWN AEROBIC AND ANAEROBIC Blood Culture adequate volume Performed at [facility], [address] [place], [place] [address]    Culture      NO GROWTH < 24 HOURS Performed at [facility], [address], [place], [place] [address]    Report Status PENDING   SARS Coronavirus 2 by RT PCR (hospital order, performed in [facility] hospital lab) *cepheid single result test* Anterior Nasal Swab     Status: None   Collection Time: 09/17/21  4:13 PM   Specimen: Anterior Nasal Swab  Result Value Ref Range   SARS Coronavirus 2 by RT PCR NEGATIVE NEGATIVE    Comment: (NOTE) SARS-CoV-2 target nucleic acids are NOT DETECTED.  The SARS-CoV-2 RNA is generally detectable in upper and lower respiratory specimens during the acute phase of infection. The lowest concentration of SARS-CoV-2 viral copies this assay can detect is 250 copies / mL. A negative result does not preclude SARS-CoV-2 infection and should not be used as the sole basis for treatment or other patient management decisions.  A negative result may occur with improper specimen collection / handling, submission of specimen other than nasopharyngeal swab, presence of viral mutation(s) within the areas targeted by this assay, and inadequate number of viral copies (<250 copies / mL). A negative result must be combined with clinical observations, patient history, and  epidemiological information.  Fact Sheet for Patients:   RoadLapTop.co.za  Fact Sheet for Healthcare Providers: http://kim-miller.com/  This test is not yet approved or  cleared by the [place] FDA and has been authorized for detection and/or diagnosis of SARS-CoV-2 by FDA under an Emergency Use Authorization (EUA).  This EUA will remain in effect (meaning this test can be used) for the duration of the COVID-19 declaration under Section 564(b)(1) of the Act, 21 U.S.C. section 360bbb-3(b)(1), unless the authorization is terminated or revoked sooner.  Performed at [facility], [address], [place], [place] [address]   Culture, blood (routine x 2)     Status: None (Preliminary result)   Collection Time: 09/17/21  4:40 PM   Specimen: BLOOD  Result Value Ref Range   Specimen Description      BLOOD BLOOD RIGHT ARM Performed at [facility], [address] [place], [place] [address]    Special Requests      Blood Culture adequate volume BOTTLES DRAWN AEROBIC AND ANAEROBIC Performed at [facility], [address], [place], [place] [address]    Culture      NO GROWTH < 24 HOURS Performed at [facility], [address] [place], [place] [address]    Report Status PENDING   Lactic acid, plasma     Status: Abnormal   Collection Time: 09/17/21  4:50 PM  Result Value Ref Range   Lactic Acid, Venous 4.3 (HH) 0.5 - 1.9 mmol/L    Comment: CRITICAL RESULT CALLED TO, READ BACK BY AND VERIFIED WITH: Wallie Renshaw 1721 09/17/2021 [facility] Performed at [facility], [address] [place], [place] [address]   Lactic acid, plasma     Status: Abnormal   Collection Time: 09/17/21  6:42 PM  Result Value Ref  Range   Lactic Acid, Venous 3.4 (HH) 0.5 - 1.9 mmol/L    Comment: CRITICAL RESULT CALLED TO, READ BACK BY AND VERIFIED WITH:  MARIA BOSTON RN 09/17/21 @  2003 VS Performed at [facility], [address] [place], [place] [address]   Lactic acid, plasma     Status: Abnormal   Collection Time: 09/17/21  9:00 PM  Result Value Ref Range   Lactic Acid, Venous 2.9 (HH) 0.5 - 1.9 mmol/L    Comment: CRITICAL VALUE NOTED.  VALUE IS CONSISTENT WITH PREVIOUSLY REPORTED AND CALLED VALUE. Performed at [facility], [address] [place], [place] [address]   Glucose, capillary     Status: Abnormal   Collection Time: 09/18/21 12:34 AM  Result Value Ref Range   Glucose-Capillary 128 (H) 70 - 99 mg/dL    Comment: Glucose reference range applies only to samples taken after fasting for at least 8 hours.  HIV Antibody (routine testing w rflx)     Status: None   Collection Time: 09/18/21  5:06 AM  Result Value Ref Range   HIV Screen 4th Generation wRfx Non Reactive Non Reactive    Comment: Performed at [facility], [address] [place], [place] [address]  Protime-INR     Status: None   Collection Time: 09/18/21  5:06 AM  Result Value Ref Range   Prothrombin Time 14.0 11.4 - 15.2 seconds   INR 1.1 0.8 - 1.2    Comment: (NOTE) INR goal varies based on device and disease states. Performed at [facility], [address] [place], [place] [address]   Cortisol-am, blood     Status: None   Collection Time: 09/18/21  5:06 AM  Result Value Ref Range   Cortisol - AM 19.4 6.7 - 22.6 ug/dL    Comment: Performed at [facility], [address] [place], [place] [address]  Procalcitonin     Status: None   Collection Time: 09/18/21  5:06 AM  Result Value Ref Range   Procalcitonin 0.60 ng/mL    Comment:        Interpretation: PCT > 0.5 ng/mL and <= 2 ng/mL: Systemic infection (sepsis) is possible, but other conditions are known to elevate PCT as well. (NOTE)       Sepsis PCT Algorithm           Lower Respiratory Tract                                      Infection PCT Algorithm     ----------------------------     ----------------------------         PCT < 0.25 ng/mL                PCT < 0.10 ng/mL          Strongly encourage             Strongly discourage   discontinuation of antibiotics    initiation of antibiotics    ----------------------------     -----------------------------       PCT 0.25 - 0.50 ng/mL            PCT 0.10 - 0.25 ng/mL               OR       >80% decrease in PCT  Discourage initiation of                                            antibiotics      Encourage discontinuation           of antibiotics    ----------------------------     -----------------------------         PCT >= 0.50 ng/mL              PCT 0.26 - 0.50 ng/mL                AND       <80% decrease in PCT             Encourage initiation of                                             antibiotics       Encourage continuation           of antibiotics    ----------------------------     -----------------------------        PCT >= 0.50 ng/mL                  PCT > 0.50 ng/mL               AND         increase in PCT                  Strongly encourage                                      initiation of antibiotics    Strongly encourage escalation           of antibiotics                                     -----------------------------                                           PCT <= 0.25 ng/mL                                                 OR                                        > 80% decrease in PCT                                      Discontinue / Do not initiate  antibiotics  Performed at [facility], [address], [place], [place] [address]   CBC     Status: Abnormal   Collection Time: 09/18/21  5:06 AM  Result Value Ref Range   WBC 7.1 4.0 - 10.5 K/uL   RBC 4.79 4.22 - 5.81 MIL/uL   Hemoglobin 14.3 13.0 - 17.0 g/dL   HCT 60.4 54.0 - 98.1 %   MCV 91.0 80.0 - 100.0 fL   MCH 29.9 26.0 -  34.0 pg   MCHC 32.8 30.0 - 36.0 g/dL   RDW 19.1 47.8 - 29.5 %   Platelets 121 (L) 150 - 400 K/uL    Comment: Immature Platelet Fraction may be clinically indicated, consider ordering this additional test [id number] REPEATED TO VERIFY    nRBC 0.0 0.0 - 0.2 %    Comment: Performed at [facility], [address], [place], [place] [address]  Comprehensive metabolic panel     Status: Abnormal   Collection Time: 09/18/21  5:06 AM  Result Value Ref Range   Sodium 139 135 - 145 mmol/L   Potassium 3.7 3.5 - 5.1 mmol/L   Chloride 104 98 - 111 mmol/L   CO2 25 22 - 32 mmol/L   Glucose, Bld 113 (H) 70 - 99 mg/dL    Comment: Glucose reference range applies only to samples taken after fasting for at least 8 hours.   BUN 22 8 - 23 mg/dL   Creatinine, Ser 6.29 0.61 - 1.24 mg/dL   Calcium 8.9 8.9 - 52.8 mg/dL   Total Protein 6.3 (L) 6.5 - 8.1 g/dL   Albumin 3.3 (L) 3.5 - 5.0 g/dL   [name] 21 15 - 41 U/L   ALT 25 0 - 44 U/L   Alkaline Phosphatase 54 38 - 126 U/L   Total Bilirubin 0.8 0.3 - 1.2 mg/dL   GFR, Estimated >41 >32 mL/min    Comment: (NOTE) Calculated using the CKD-EPI Creatinine Equation (2021)    Anion gap 10 5 - 15    Comment: Performed at [facility], [address], [place], [place] [address]  Glucose, capillary     Status: Abnormal   Collection Time: 09/18/21  7:39 AM  Result Value Ref Range   Glucose-Capillary 124 (H) 70 - 99 mg/dL    Comment: Glucose reference range applies only to samples taken after fasting for at least 8 hours.  Glucose, capillary     Status: Abnormal   Collection Time: 09/18/21 11:58 AM  Result Value Ref Range   Glucose-Capillary 173 (H) 70 - 99 mg/dL    Comment: Glucose reference range applies only to samples taken after fasting for at least 8 hours.    MR FOOT RIGHT W WO CONTRAST  Result Date: 09/18/2021 CLINICAL DATA:  Chronic diabetic ulcer.  Neuropathic joint EXAM: MRI OF THE RIGHT FOREFOOT WITHOUT AND WITH  CONTRAST TECHNIQUE: Multiplanar, multisequence MR imaging of the right forefoot was performed before and after the administration of intravenous contrast. CONTRAST:  10mL GADAVIST GADOBUTROL 1 MMOL/ML IV SOLN COMPARISON:  X-ray 09/17/2021 FINDINGS: Bones/Joint/Cartilage No acute fracture. No dislocation. 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 No evidence of superimposed acute bone destruction. No extra-articular sites of bone marrow edema or periostitis. No significant joint effusions. Ligaments Intact Lisfranc ligament. Collateral ligaments of the forefoot intact. Muscles and Tendons Chronic denervation changes of the intrinsic foot musculature. Intact flexor and extensor tendons. No tenosynovitis. Soft  tissues Shallow superficial wound or ulceration along the plantar aspect of the proximal forefoot. No deep wound or ulceration. Generalized soft tissue swelling and subcutaneous edema. No organized or rim enhancing fluid collection. IMPRESSION: 1. Shallow superficial wound or ulceration along the plantar aspect of the proximal forefoot. No evidence of acute osteomyelitis. 2. 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 3. Generalized soft tissue swelling and subcutaneous edema. No organized or rim enhancing fluid collection. Electronically Signed   By: Duanne Guess D.O.   On: 09/18/2021 09:48   DG Chest Portable 1 View  Result Date: 09/17/2021 CLINICAL DATA:  Fever EXAM: PORTABLE CHEST 1 VIEW COMPARISON:  02/07/2018 FINDINGS: Transverse diameter of heart is slightly increased. There are no signs of pulmonary edema or focal pulmonary consolidation. There is no pleural effusion or pneumothorax. IMPRESSION: No active disease. Electronically Signed   By: Ernie Avena M.D.   On: 09/17/2021 15:37    Review of Systems Blood pressure 138/81,  pulse 79, temperature 98.2 F (36.8 C), temperature source Oral, resp. rate 18, height  (1.702 m), weight 126.3 kg, SpO2 97 %. Physical Exam General: AAO x3, NAD  Dermatological: Partial-thickness ulceration present on the medial aspect of the arch of the foot on the right side.  No drainage or pus.  No fluctuance or crepitation.  Minimal edema.  There is no significant cellulitis present.  I did see him yesterday in the clinic as well and there is mild erythema but does appear to be much improved.  There is no malodor.  Vascular: Dorsalis Pedis artery and Posterior Tibial artery pedal pulses are palpable bilateral with immedate capillary fill time. There is no pain with calf compression, swelling, warmth, erythema.   Neruologic: Sensation decreased  Musculoskeletal: Significant flatfoot present.  Assessment/Plan: 66 year old male with resolving infection right foot  White blood cell count decreased today to 7.1 from 14.6 yesterday.  Is currently afebrile and vital signs are stable.  MRI reviewed.  No evidence of acute osteomyelitis.  No fluid collection.  He has responded well to IV antibiotics and I recommend at least another 24 hours of IV antibiotics and can discharge home with oral antibiotics with close follow-up in the office next week.  Continue with daily dressing changes.  Xeroform and dry dressing applied today.  He can weight-bear as tolerated although limited.  Elevation.  [name] 09/18/2021, 4:00 PM

## 2021-09-18 NOTE — Progress Notes (Signed)
PROGRESS NOTE    [name]  [id number] DOB: 29-Nov-1955 DOA: 09/17/2021 PCP: Lajean Manes, MD   Brief Narrative: 66 year old with past medical history significant for CAD, hypertension, hyperlipidemia, obesity, OSA, gout, GERD, neuropathy, diabetes presents with chronic diabetic foot ulcer and fever.  Patient also report weakness and intermittent nausea for the past 2 days prior to admission.  Patient was found to have leukocytosis, lactic acid peaked to 4.3.  Chest x-ray no active disease.   Assessment & Plan:   Principal Problem:   Sepsis ([place]) Active Problems:   CAD (coronary artery disease), native coronary artery   Essential hypertension   Hyperlipidemia   Morbid obesity ([place])   Obstructive sleep apnea   Gastroesophageal reflux disease   Peripheral neuropathy   Polyneuropathy due to type 2 diabetes mellitus ([place])   DM (diabetes mellitus) ([place])   Diabetic foot infection ([place])    1-Sepsis secondary to diabetic foot infection -MRI: Shallow superficial wound or ulceration along the plantar aspect of the proximal forefoot. No evidence of acute osteomyelitis. 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 Generalized soft tissue swelling and subcutaneous edema. No organized or rim enhancing fluid collection. -Patient presented with fever, chills, in the setting of diabetic foot infection.  Leukocytosis, lactic acidosis. -Chest x-ray UA negative for infection. -X-ray performed at podiatry office showed some signs of osteomyelitis. -Continue with cefepime and vancomycin, flagyl -Per HPI podiatry will see patient in consultation.   2-CAD, hyperlipidemia, hypertension -Continue with Imdur, aspirin, atorvastatin -Holding lisinopril, metoprolol, triamterene hydrochlorothiazide in the setting of soft low blood pressure. Vital stable.   3-OSA: on CPAP/  Friend report patient oxygen was low  overnight and he was placed on Oxygen with CPAP/  Plan to check Nocturnal Pulse Oxymetry overnight to evaluate for home oxygen needs with CPAP/   4-Diabetes: SSI  GERD: PPI  Neuropathy: Continue with pregabalin  Obesity: Need lifestyle modification  Thrombocytopenia; monitor.        Estimated body mass index is 42.42 kg/m as calculated from the following:   Height as of 08/13/21: 5\' 8"  (1.727 m).   Weight as of 08/13/21: 126.6 kg.   DVT prophylaxis: Lovenox Code Status: Full code Family Communication: Care discussed with patient.  Disposition Plan:  Status is: Observation The patient will require care spanning > 2 midnights and should be moved to inpatient because: needs IV antibiotics for foot infection.     Consultants:  Podiatry   Procedures:  None  Antimicrobials:    Subjective: He is alert and conversant. He has mild headaches. Denies neck pain. He was confuse last night when he had fever. This morning he is not confuse.  Family is concern with meningitis. Patient is not confuse today, he denies neck pain. Mild headache could be from acute illness. Unlikely meningitis. Infection likely related to foot infection.   Objective: Vitals:   09/17/21 2254 09/17/21 2346 09/18/21 0300 09/18/21 0551  BP: (!) 113/52  116/65 134/68  Pulse: 87  67 67  Resp: 20 (!) 25 18 18   Temp: (!) 101.2 F (38.4 C)  98.5 F (36.9 C) (!) 97.5 F (36.4 C)  TempSrc: Oral  Axillary   SpO2: 92%  94% 99%    Intake/Output Summary (Last 24 hours) at 09/18/2021 0714 Last data filed at 09/18/2021 0302 Gross per 24 hour  Intake 680.13 ml  Output 500 ml  Net 180.13 ml   There were no vitals filed for this  visit.  Examination:  General exam: Appears calm and comfortable  Respiratory system: Clear to auscultation. Respiratory effort normal. Cardiovascular system: S1 & S2 heard, RRR. No JVD, murmurs, rubs, gallops or clicks. No pedal edema. Gastrointestinal system: Abdomen is  nondistended, soft and nontender. No organomegaly or masses felt. Normal bowel sounds heard. Central nervous system: Alert and oriented. No focal neurological deficits. Extremities: right foot with deformity, open wound with drainage.    Data Reviewed: I have personally reviewed following labs and imaging studies  CBC: Recent Labs  Lab 09/17/21 1400 09/18/21 0506  WBC 14.6* 7.1  NEUTROABS 12.8*  --   HGB 15.6 14.3  HCT 47.5 43.6  MCV 89.8 91.0  PLT 118* [id number]*   Basic Metabolic Panel: Recent Labs  Lab 09/17/21 1400 09/18/21 0506  NA 137 139  K 4.5 3.7  CL 102 104  CO2 22 25  GLUCOSE 161* 113*  BUN 24* 22  CREATININE 1.08 0.99  CALCIUM 9.5 8.9   GFR: CrCl cannot be calculated (Unknown ideal weight.). Liver Function Tests: Recent Labs  Lab 09/17/21 1400 09/18/21 0506  [name] 25 21  ALT 25 25  ALKPHOS 70 54  BILITOT 0.9 0.8  PROT 6.9 6.3*  ALBUMIN 3.9 3.3*   No results for input(s): LIPASE, AMYLASE in the last 168 hours. No results for input(s): AMMONIA in the last 168 hours. Coagulation Profile: Recent Labs  Lab 09/18/21 0506  INR 1.1   Cardiac Enzymes: No results for input(s): CKTOTAL, CKMB, CKMBINDEX, TROPONINI in the last 168 hours. BNP (last 3 results) No results for input(s): PROBNP in the last 8760 hours. HbA1C: No results for input(s): HGBA1C in the last 72 hours. CBG: Recent Labs  Lab 09/18/21 0034  GLUCAP 128*   Lipid Profile: No results for input(s): CHOL, HDL, LDLCALC, TRIG, CHOLHDL, LDLDIRECT in the last 72 hours. Thyroid Function Tests: No results for input(s): TSH, T4TOTAL, FREET4, T3FREE, THYROIDAB in the last 72 hours. Anemia Panel: No results for input(s): VITAMINB12, FOLATE, FERRITIN, TIBC, IRON, RETICCTPCT in the last 72 hours. Sepsis Labs: Recent Labs  Lab 09/17/21 1400 09/17/21 1650 09/17/21 1842 09/17/21 2100 09/18/21 0506  PROCALCITON  --   --   --   --  0.60  LATICACIDVEN 3.6* 4.3* 3.4* 2.9*  --     Recent Results  (from the past 240 hour(s))  SARS Coronavirus 2 by RT PCR (hospital order, performed in [facility] hospital lab) *cepheid single result test* Anterior Nasal Swab     Status: None   Collection Time: 09/17/21  4:13 PM   Specimen: Anterior Nasal Swab  Result Value Ref Range Status   SARS Coronavirus 2 by RT PCR NEGATIVE NEGATIVE Final    Comment: (NOTE) SARS-CoV-2 target nucleic acids are NOT DETECTED.  The SARS-CoV-2 RNA is generally detectable in upper and lower respiratory specimens during the acute phase of infection. The lowest concentration of SARS-CoV-2 viral copies this assay can detect is 250 copies / mL. A negative result does not preclude SARS-CoV-2 infection and should not be used as the sole basis for treatment or other patient management decisions.  A negative result may occur with improper specimen collection / handling, submission of specimen other than nasopharyngeal swab, presence of viral mutation(s) within the areas targeted by this assay, and inadequate number of viral copies (<250 copies / mL). A negative result must be combined with clinical observations, patient history, and epidemiological information.  Fact Sheet for Patients:   https://www.patel.info/  Fact Sheet for Healthcare  Providers: https://hall.com/  This test is not yet approved or  cleared by the [place] and has been authorized for detection and/or diagnosis of SARS-CoV-2 by FDA under an Emergency Use Authorization (EUA).  This EUA will remain in effect (meaning this test can be used) for the duration of the COVID-19 declaration under Section 564(b)(1) of the Act, 21 U.S.C. section 360bbb-3(b)(1), unless the authorization is terminated or revoked sooner.  Performed at [facility], [address] [place], [place] [address]          Radiology Studies: DG Chest Portable 1 View  Result Date: 09/17/2021 CLINICAL DATA:   Fever EXAM: PORTABLE CHEST 1 VIEW COMPARISON:  02/07/2018 FINDINGS: Transverse diameter of heart is slightly increased. There are no signs of pulmonary edema or focal pulmonary consolidation. There is no pleural effusion or pneumothorax. IMPRESSION: No active disease. Electronically Signed   By: Elmer Picker M.D.   On: 09/17/2021 15:37        Scheduled Meds:  atorvastatin  80 mg Oral QHS   colesevelam  1,875 mg Oral QHS   enoxaparin (LOVENOX) injection  40 mg Subcutaneous Q24H   insulin aspart  0-15 Units Subcutaneous TID WC   isosorbide mononitrate  120 mg Oral Daily   pantoprazole  40 mg Oral Daily   pregabalin  150 mg Oral BID   sodium chloride flush  3 mL Intravenous Q12H   tamsulosin  0.4 mg Oral QPC supper   Continuous Infusions:  ceFEPime (MAXIPIME) IV 2 g (09/18/21 0411)   lactated ringers 125 mL/hr at 09/17/21 2359   metronidazole 500 mg (09/17/21 1952)   vancomycin       LOS: 0 days    Time spent: 35 minutes.     Elmarie Shiley, MD [facility]   If 7PM-7AM, please contact night-coverage [email or web address]  09/18/2021, 7:14 AM

## 2021-09-19 ENCOUNTER — Inpatient Hospital Stay (HOSPITAL_COMMUNITY): Payer: Medicare PPO

## 2021-09-19 DIAGNOSIS — A419 Sepsis, unspecified organism: Secondary | ICD-10-CM

## 2021-09-19 LAB — D-DIMER, QUANTITATIVE: D-Dimer, Quant: 0.91 ug/mL-FEU — ABNORMAL HIGH (ref 0.00–0.50)

## 2021-09-19 LAB — GLUCOSE, CAPILLARY
Glucose-Capillary: 143 mg/dL — ABNORMAL HIGH (ref 70–99)
Glucose-Capillary: 174 mg/dL — ABNORMAL HIGH (ref 70–99)

## 2021-09-19 MED ORDER — SODIUM CHLORIDE 0.9 % IV SOLN: INTRAVENOUS | Status: DC

## 2021-09-19 MED ORDER — DOXYCYCLINE MONOHYDRATE 100 MG PO TABS: 100.0000 mg | ORAL_TABLET | Freq: Two times a day (BID) | ORAL | 0 refills | Status: AC

## 2021-09-19 MED ORDER — METRONIDAZOLE 500 MG PO TABS
500.0000 mg | ORAL_TABLET | Freq: Two times a day (BID) | ORAL | Status: DC
  Administered 2021-09-19: 500 mg via ORAL
  Filled 2021-09-19: qty 1

## 2021-09-19 MED ORDER — IOHEXOL 350 MG/ML SOLN
75.0000 mL | Freq: Once | INTRAVENOUS | Status: AC | PRN
Start: 2021-09-19 — End: 2021-09-19
  Administered 2021-09-19: 75 mL via INTRAVENOUS

## 2021-09-19 NOTE — Discharge Summary (Signed)
Physician Discharge Summary  [name] JSH:[id number] DOB: [birth year] DOA: 09/17/2021  PCP: Merlene Laughter, MD  Admit date: 09/17/2021 Discharge date: 09/19/2021 Recommendations for Outpatient Follow-up:  Follow up with PCP in 1 weeks-call for appointment Please obtain BMP/CBC in one week  Discharge Dispo: home Discharge Condition: Stable Code Status:   Code Status: Full Code Diet recommendation:  Diet Order             Diet heart healthy/carb modified Room service appropriate? Yes; Fluid consistency: Thin  Diet effective now                    Brief/Interim Summary: 66 year old with past medical history significant for CAD, hypertension, hyperlipidemia, obesity, OSA, gout, GERD, neuropathy, diabetes presents with chronic diabetic foot ulcer and fever.  Patient also report weakness and intermittent nausea for the past 2 days prior to admission. Patient was admitted for presumed sepsis likely diabetic foot at the source, MRI rule out osteomyelitis, podiatry was consulted.  Work-up with chest x-ray negative for infection, procalcitonin borderline 0.6, a.m. cortisol stable 19.4, blood culture no growth since admission D-dimer slightly elevated underwent a CTA chest to rule out PE due to concern. EP is rule out at this time patient can discharge her on oral Augmentin for a week and follow-up with Dr. Ardelle Anton in office next week.  Rest of the medical is clinically stable and will continue his home medication   Discharge Diagnoses:  Principal Problem:   Sepsis (HCC) Active Problems:   CAD (coronary artery disease), native coronary artery   Essential hypertension   Hyperlipidemia   Morbid obesity (HCC)   Obstructive sleep apnea   Gastroesophageal reflux disease   Peripheral neuropathy   Polyneuropathy due to type 2 diabetes mellitus (HCC)   DM (diabetes mellitus) (HCC)   Diabetic foot infection (HCC)  Sepsis secondary to diabetic foot infection MRI: Shallow superficial wound or  ulceration along the plantar aspect of the proximal forefoot. No evidence of acute osteomyelitis. 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 Generalized soft tissue swelling and subcutaneous edema. No organized or rim enhancing fluid collection.Patient presented with fever, chills, in the setting of diabetic foot infection.  Leukocytosis, lactic acidosis.Chest x-ray UA negative for infection.X-ray performed at podiatry office showed some signs of osteomyelitis. Treated with cefepime and vancomycin, flagyl.  D-dimer slightly elevated, negative for PE and CT angio chest.  Seen by podiatry okay for discharge home on oral antibiotics with outpatient close follow-up.  CAD, hyperlipidemia, hypertension: No chest pain heart rate is stable continue on home regimen with Imdur, aspirin, atorvastatin.  BP stable on discharge resume Lisinopril, metoprolol, triamterene hydrochlorothiazide  OSA: on CPAP-continue,doing well on room air this morning Diabetes: SSI GERD: PPI Neuropathy: Continue with pregabalin Obesity class IIII: Need lifestyle modification  Consults: podaitry Subjective: Alert awake oriented resting comfortably, eager to go home today  Discharge Exam: Vitals:   09/18/21 2107 09/19/21 0346  BP: 128/73 137/75  Pulse: 72 74  Resp: 18 16  Temp: 98.6 F (37 C) 100 F (37.8 C)  SpO2: 92% 94%   General: Pt is alert, awake, not in acute distress Cardiovascular: RRR, S1/S2 +, no rubs, no gallops Respiratory: CTA bilaterally, no wheezing, no rhonchi Abdominal: Soft, NT, ND, bowel sounds + Extremities: no edema, no cyanosis  Discharge Instructions  Discharge Instructions     Discharge instructions   Complete by: As directed    Please call call MD  or return to ER for similar or worsening recurring problem that brought you to hospital or if any fever,nausea/vomiting,abdominal pain, uncontrolled pain, chest  pain,  shortness of breath or any other alarming symptoms.  Please follow-up your doctor as instructed in a week time and call the office for appointment.  Please avoid alcohol, smoking, or any other illicit substance and maintain healthy habits including taking your regular medications as prescribed.  You were cared for by a hospitalist during your hospital stay. If you have any questions about your discharge medications or the care you received while you were in the hospital after you are discharged, you can call the unit and ask to speak with the hospitalist on call if the hospitalist that took care of you is not available.  Once you are discharged, your primary care physician will handle any further medical issues. Please note that NO REFILLS for any discharge medications will be authorized once you are discharged, as it is imperative that you return to your primary care physician (or establish a relationship with a primary care physician if you do not have one) for your aftercare needs so that they can reassess your need for medications and monitor your lab values   Increase activity slowly   Complete by: As directed       Allergies as of 09/19/2021       Reactions   Metformin Other (See Comments)   Penicillins Rash   Has patient had a PCN reaction causing immediate rash, facial/tongue/throat swelling, SOB or lightheadedness with hypotension: Yes Has patient had a PCN reaction causing severe rash involving mucus membranes or skin necrosis: Unk Has patient had a PCN reaction that required hospitalization: Unk Has patient had a PCN reaction occurring within the last 10 years: No If all of the above answers are "NO", then may proceed with Cephalosporin use.        Medication List     TAKE these medications    acetaminophen 650 MG CR tablet Commonly known as: TYLENOL Take 1,300 mg by mouth at bedtime.   aspirin EC 325 MG tablet Take 325 mg by mouth daily.   atorvastatin 80 MG  tablet Commonly known as: LIPITOR Take 80 mg by mouth at bedtime.   celecoxib 200 MG capsule Commonly known as: CELEBREX TAKE 1 CAPSULE BY MOUTH EVERY DAY   COSAMIN DS PO Take 2 tablets by mouth daily with breakfast.   doxycycline 100 MG tablet Commonly known as: ADOXA Take 1 tablet (100 mg total) by mouth 2 (two) times daily for 7 days.   empagliflozin 25 MG Tabs tablet Commonly known as: JARDIANCE Take 25 mg by mouth daily.   glimepiride 2 MG tablet Commonly known as: AMARYL Take 2 mg by mouth daily.   isosorbide mononitrate 120 MG 24 hr tablet Commonly known as: IMDUR TAKE 1 TABLET BY MOUTH EVERY DAY   lisinopril 5 MG tablet Commonly known as: ZESTRIL Take 5 mg by mouth at bedtime.   Menthol (Topical Analgesic) 4 % Gel Apply 1 application topically See admin instructions. Apply to both feet at bedtime for neuropathy   metoprolol succinate 100 MG 24 hr tablet Commonly known as: TOPROL-XL TAKE 1 TABLET BY MOUTH EVERY DAY WITH OR IMMEDIATELY FOLLOWING A MEAL   multivitamin tablet Take 1 tablet by mouth daily.   nitroGLYCERIN 0.4 MG SL tablet Commonly known as: Nitrostat Place 1 tablet (0.4 mg total) under the tongue every 5 (five) minutes x 3 doses as needed for chest  pain.   NON FORMULARY [facility]  Peripheral Neuropathy Cream- Bupivacaine 1%, Doxepin 3%, Gabapentin 6%, Pentoxifylline 3%, Topiramate 1% Apply 1-2 grams to affected area 3-4 times daily Qty. 120 gm 3 refills   omeprazole 20 MG capsule Commonly known as: PRILOSEC Take 20 mg by mouth daily.   pregabalin 150 MG capsule Commonly known as: LYRICA Take 1 capsule (150 mg total) by mouth 2 (two) times daily.   senna-docusate 8.6-50 MG tablet Commonly known as: Senokot S Take 1 tablet by mouth at bedtime as needed.   tamsulosin 0.4 MG Caps capsule Commonly known as: FLOMAX   triamterene-hydrochlorothiazide 75-50 MG tablet Commonly known as: MAXZIDE Take 0.5 tablets by mouth daily.    VISINE OP Place 1 drop into both eyes daily as needed (for dry or irritated eyes).   Vitamin D3 50 MCG (2000 UT) Tabs Take 2,000 Units by mouth daily.   Welchol 625 MG tablet Generic drug: colesevelam Take 1,875 mg by mouth at bedtime.        Allergies  Allergen Reactions   Metformin Other (See Comments)   Penicillins Rash    Has patient had a PCN reaction causing immediate rash, facial/tongue/throat swelling, SOB or lightheadedness with hypotension: Yes Has patient had a PCN reaction causing severe rash involving mucus membranes or skin necrosis: Unk Has patient had a PCN reaction that required hospitalization: Unk Has patient had a PCN reaction occurring within the last 10 years: No If all of the above answers are "NO", then may proceed with Cephalosporin use.     The results of significant diagnostics from this hospitalization (including imaging, microbiology, ancillary and laboratory) are listed below for reference.    Microbiology: Recent Results (from the past 240 hour(s))  Culture, blood (routine x 2)     Status: None (Preliminary result)   Collection Time: 09/17/21  3:40 PM   Specimen: BLOOD  Result Value Ref Range Status   Specimen Description   Final    BLOOD RIGHT ANTECUBITAL Performed at [facility], [address], [place], [place] [address]    Special Requests   Final    BOTTLES DRAWN AEROBIC AND ANAEROBIC Blood Culture adequate volume Performed at [facility], [address], [place], [place] [address]    Culture   Final    NO GROWTH 2 DAYS Performed at [facility], [address] [place], [place] [address]    Report Status PENDING  Incomplete  SARS Coronavirus 2 by RT PCR (hospital order, performed in [facility] hospital lab) *cepheid single result test* Anterior Nasal Swab     Status: None   Collection Time: 09/17/21  4:13 PM   Specimen: Anterior Nasal Swab  Result Value Ref Range Status   SARS  Coronavirus 2 by RT PCR NEGATIVE NEGATIVE Final    Comment: (NOTE) SARS-CoV-2 target nucleic acids are NOT DETECTED.  The SARS-CoV-2 RNA is generally detectable in upper and lower respiratory specimens during the acute phase of infection. The lowest concentration of SARS-CoV-2 viral copies this assay can detect is 250 copies / mL. A negative result does not preclude SARS-CoV-2 infection and should not be used as the sole basis for treatment or other patient management decisions.  A negative result may occur with improper specimen collection / handling, submission of specimen other than nasopharyngeal swab, presence of viral mutation(s) within the areas targeted by this assay, and inadequate number of viral copies (<250 copies / mL). A negative result must be combined with  clinical observations, patient history, and epidemiological information.  Fact Sheet for Patients:   RoadLapTop.co.za  Fact Sheet for Healthcare Providers: http://kim-miller.com/  This test is not yet approved or  cleared by the [place] FDA and has been authorized for detection and/or diagnosis of SARS-CoV-2 by FDA under an Emergency Use Authorization (EUA).  This EUA will remain in effect (meaning this test can be used) for the duration of the COVID-19 declaration under Section 564(b)(1) of the Act, 21 U.S.C. section 360bbb-3(b)(1), unless the authorization is terminated or revoked sooner.  Performed at [facility], [address], [place], [place] [address]   Culture, blood (routine x 2)     Status: None (Preliminary result)   Collection Time: 09/17/21  4:40 PM   Specimen: BLOOD  Result Value Ref Range Status   Specimen Description   Final    BLOOD BLOOD RIGHT ARM Performed at [facility], [address], [place], [place] [address]    Special Requests   Final    Blood Culture adequate volume BOTTLES DRAWN AEROBIC  AND ANAEROBIC Performed at [facility], [address], [place], [place] [address]    Culture   Final    NO GROWTH 2 DAYS Performed at [facility], [address], [place], [place] [address]    Report Status PENDING  Incomplete    Procedures/Studies: CT Angio Chest Pulmonary Embolism (PE) W or WO Contrast  Result Date: 09/19/2021 CLINICAL DATA:  Elevated D-dimer question pulmonary embolism, history coronary artery disease, diabetes mellitus, hypertension EXAM: CT ANGIOGRAPHY CHEST WITH CONTRAST TECHNIQUE: Multidetector CT imaging of the chest was performed using the standard protocol during bolus administration of intravenous contrast. Multiplanar CT image reconstructions and MIPs were obtained to evaluate the vascular anatomy. RADIATION DOSE REDUCTION: This exam was performed according to the departmental dose-optimization program which includes automated exposure control, adjustment of the mA and/or kV according to patient size and/or use of iterative reconstruction technique. CONTRAST:  53mL OMNIPAQUE IOHEXOL 350 MG/ML SOLN IV COMPARISON:  None FINDINGS: Cardiovascular: Atherosclerotic calcifications aorta, proximal great vessels, and coronary arteries. Aorta normal caliber without aneurysm or dissection. Heart unremarkable. No pericardial effusion. Pulmonary arteries adequately opacified and patent. No evidence of pulmonary embolism. Mediastinum/Nodes: Esophagus unremarkable. Base of cervical region normal appearance. No thoracic adenopathy. Lungs/Pleura: Dependent atelectasis BILATERAL lower lobes posteriorly. Lungs otherwise clear. No pulmonary infiltrate, pleural effusion, or pneumothorax. Visualized upper abdomen unremarkable Upper Abdomen: Upper abnormal unremarkable. Musculoskeletal: Scattered endplate spur formation thoracic spine. Review of the MIP images confirms the above findings. IMPRESSION: No evidence of pulmonary embolism. Dependent atelectasis BILATERAL  lower lobes posteriorly. Aortic Atherosclerosis (ICD10-I70.0). Electronically Signed   By: Ulyses Southward M.D.   On: 09/19/2021 13:33   MR FOOT RIGHT W WO CONTRAST  Result Date: 09/18/2021 CLINICAL DATA:  Chronic diabetic ulcer.  Neuropathic joint EXAM: MRI OF THE RIGHT FOREFOOT WITHOUT AND WITH CONTRAST TECHNIQUE: Multiplanar, multisequence MR imaging of the right forefoot was performed before and after the administration of intravenous contrast. CONTRAST:  37mL GADAVIST GADOBUTROL 1 MMOL/ML IV SOLN COMPARISON:  X-ray 09/17/2021 FINDINGS: Bones/Joint/Cartilage No acute fracture. No dislocation. 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 No evidence of superimposed acute bone destruction. No extra-articular sites of bone marrow edema or periostitis. No significant joint effusions. Ligaments Intact Lisfranc ligament. Collateral ligaments of the forefoot intact. Muscles and Tendons Chronic denervation changes of the intrinsic foot musculature. Intact flexor and extensor tendons.  No tenosynovitis. Soft tissues Shallow superficial wound or ulceration along the plantar aspect of the proximal forefoot. No deep wound or ulceration. Generalized soft tissue swelling and subcutaneous edema. No organized or rim enhancing fluid collection. IMPRESSION: 1. Shallow superficial wound or ulceration along the plantar aspect of the proximal forefoot. No evidence of acute osteomyelitis. 2. 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 3. Generalized soft tissue swelling and subcutaneous edema. No organized or rim enhancing fluid collection. Electronically Signed   By: Duanne Guess D.O.   On: 09/18/2021 09:48   DG Chest Portable 1 View  Result Date: 09/17/2021 CLINICAL DATA:  Fever EXAM: PORTABLE CHEST 1 VIEW COMPARISON:  02/07/2018 FINDINGS: Transverse diameter of heart is  slightly increased. There are no signs of pulmonary edema or focal pulmonary consolidation. There is no pleural effusion or pneumothorax. IMPRESSION: No active disease. Electronically Signed   By: Ernie Avena M.D.   On: 09/17/2021 15:37    Labs: BNP (last 3 results) No results for input(s): "BNP" in the last 8760 hours. Basic Metabolic Panel: Recent Labs  Lab 09/17/21 1400 09/18/21 0506  NA 137 139  K 4.5 3.7  CL 102 104  CO2 22 25  GLUCOSE 161* 113*  BUN 24* 22  CREATININE 1.08 0.99  CALCIUM 9.5 8.9   Liver Function Tests: Recent Labs  Lab 09/17/21 1400 09/18/21 0506  [name] 25 21  ALT 25 25  ALKPHOS 70 54  BILITOT 0.9 0.8  PROT 6.9 6.3*  ALBUMIN 3.9 3.3*   No results for input(s): "LIPASE", "AMYLASE" in the last 168 hours. No results for input(s): "AMMONIA" in the last 168 hours. CBC: Recent Labs  Lab 09/17/21 1400 09/18/21 0506  WBC 14.6* 7.1  NEUTROABS 12.8*  --   HGB 15.6 14.3  HCT 47.5 43.6  MCV 89.8 91.0  PLT 118* 121*   Cardiac Enzymes: No results for input(s): "CKTOTAL", "CKMB", "CKMBINDEX", "TROPONINI" in the last 168 hours. BNP: Invalid input(s): "POCBNP" CBG: Recent Labs  Lab 09/18/21 1158 09/18/21 1637 09/18/21 2108 09/19/21 0743 09/19/21 1136  GLUCAP 173* 199* 164* 143* 174*   D-Dimer Recent Labs    09/19/21 0921  DDIMER 0.91*   Hgb A1c No results for input(s): "HGBA1C" in the last 72 hours. Lipid Profile No results for input(s): "CHOL", "HDL", "LDLCALC", "TRIG", "CHOLHDL", "LDLDIRECT" in the last 72 hours. Thyroid function studies No results for input(s): "TSH", "T4TOTAL", "T3FREE", "THYROIDAB" in the last 72 hours.  Invalid input(s): "FREET3" Anemia work up No results for input(s): "VITAMINB12", "FOLATE", "FERRITIN", "TIBC", "IRON", "RETICCTPCT" in the last 72 hours. Urinalysis    Component Value Date/Time   COLORURINE YELLOW 09/17/2021 1437   APPEARANCEUR CLEAR 09/17/2021 1437   LABSPEC 1.022 09/17/2021 1437    PHURINE 5.5 09/17/2021 1437   GLUCOSEU >1,000 (A) 09/17/2021 1437   HGBUR NEGATIVE 09/17/2021 1437   BILIRUBINUR NEGATIVE 09/17/2021 1437   KETONESUR NEGATIVE 09/17/2021 1437   PROTEINUR NEGATIVE 09/17/2021 1437   NITRITE NEGATIVE 09/17/2021 1437   LEUKOCYTESUR NEGATIVE 09/17/2021 1437   Sepsis Labs Recent Labs  Lab 09/17/21 1400 09/18/21 0506  WBC 14.6* 7.1   Microbiology Recent Results (from the past 240 hour(s))  Culture, blood (routine x 2)     Status: None (Preliminary result)   Collection Time: 09/17/21  3:40 PM   Specimen: BLOOD  Result Value Ref Range Status   Specimen Description   Final    BLOOD RIGHT ANTECUBITAL Performed at [facility], [address], [place], [place] [address]    Special Requests   Final    BOTTLES DRAWN AEROBIC AND ANAEROBIC Blood Culture adequate volume Performed at [facility], [address], [place], [place] [address]    Culture   Final    NO GROWTH 2 DAYS Performed at [facility], [address] [place], [place] [address]    Report Status PENDING  Incomplete  SARS Coronavirus 2 by RT PCR (hospital order, performed in [facility] hospital lab) *cepheid single result test* Anterior Nasal Swab     Status: None   Collection Time: 09/17/21  4:13 PM   Specimen: Anterior Nasal Swab  Result Value Ref Range Status   SARS Coronavirus 2 by RT PCR NEGATIVE NEGATIVE Final    Comment: (NOTE) SARS-CoV-2 target nucleic acids are NOT DETECTED.  The SARS-CoV-2 RNA is generally detectable in upper and lower respiratory specimens during the acute phase of infection. The lowest concentration of SARS-CoV-2 viral copies this assay can detect is 250 copies / mL. A negative result does not preclude SARS-CoV-2 infection and should not be used as the sole basis for treatment or other patient management decisions.  A negative result may occur with improper specimen collection / handling, submission of specimen  other than nasopharyngeal swab, presence of viral mutation(s) within the areas targeted by this assay, and inadequate number of viral copies (<250 copies / mL). A negative result must be combined with clinical observations, patient history, and epidemiological information.  Fact Sheet for Patients:   RoadLapTop.co.zahttps://www.fda.gov/media/158405/download  Fact Sheet for Healthcare Providers: http://kim-miller.com/https://www.fda.gov/media/158404/download  This test is not yet approved or  cleared by the [place] FDA and has been authorized for detection and/or diagnosis of SARS-CoV-2 by FDA under an Emergency Use Authorization (EUA).  This EUA will remain in effect (meaning this test can be used) for the duration of the COVID-19 declaration under Section 564(b)(1) of the Act, 21 U.S.C. section 360bbb-3(b)(1), unless the authorization is terminated or revoked sooner.  Performed at [facility], [address], [place], [place] [address]   Culture, blood (routine x 2)     Status: None (Preliminary result)   Collection Time: 09/17/21  4:40 PM   Specimen: BLOOD  Result Value Ref Range Status   Specimen Description   Final    BLOOD BLOOD RIGHT ARM Performed at [facility], [address], [place], [place] [address]    Special Requests   Final    Blood Culture adequate volume BOTTLES DRAWN AEROBIC AND ANAEROBIC Performed at [facility], [address], [place], [place] [address]    Culture   Final    NO GROWTH 2 DAYS Performed at [facility], [address] [place], [place] [address]    Report Status PENDING  Incomplete   Time coordinating discharge: 25 minutes  SIGNED: Lanae Boastamesh Dominika Losey, MD  [facility] 09/19/2021, 1:58 PM  If 7PM-7AM, please contact night-coverage [email or web address]

## 2021-09-19 NOTE — Progress Notes (Signed)
Pt discharging home. PIV removed. VSS. AVS printed and educational teaching completed with teach back method. Post-op boot provided to pt. Dressing changed prior to discharge. Pt provided dressing supplies. Pt has all belongings. No further questions at this time.

## 2021-09-19 NOTE — Progress Notes (Signed)
  Subjective:  Patient ID: [name], male    DOB: 03/03/1956,  MRN: [id number]  Says he still feels tired but much better than he did when he saw me in the office the other day.  Says he does not really remember coming to the office because it was a blur.  Was using CPAP and oxygen overnight they are trying to see if he can not need that  Review of systems: Feels fatigued required CPAP for breathing overnight, no specific chest pain leg swelling or leg pain Objective:   Vitals:   09/18/21 2107 09/19/21 0346  BP: 128/73 137/75  Pulse: 72 74  Resp: 18 16  Temp: 98.6 F (37 C) 100 F (37.8 C)  SpO2: 92% 94%   General AA&O x3. Normal mood and affect.  Vascular Dorsalis pedis and posterior tibial pulses 2/4 bilat. Brisk capillary refill to all digits. Pedal hair present.  Neurologic Epicritic sensation grossly intact.  Dermatologic Ulceration stable  Orthopedic: MMT 5/5 in dorsiflexion, plantarflexion, inversion, and eversion. Normal joint ROM without pain or crepitus.    Assessment & Plan:  Patient was evaluated and treated and all questions answered.  Diabetic foot infection -Still a fairly atypical presentation of a diabetic foot infection because his wound appears benign.  MRI was unrevealing for osteomyelitis or abscess.  It is certainly possible that he did have a transient bacteremia causing this illness.  He was COVID-negative.  I discussed with Dr. Jonathon Bellows if there are any other avenues such as a small PE that could contribute considering his oxygen requirement on CPAP.  He will evaluate him today this morning.  If remaining work-up is negative then from our standpoint he should be cleared to go home on 1 week of Augmentin.  Dr. Ardelle Anton will see him in the office next week.  Edwin Cap, DPM  Accessible via secure chat for questions or concerns.

## 2021-09-19 NOTE — Progress Notes (Signed)
PHARMACIST - PHYSICIAN COMMUNICATION DR:   Jonathon Bellows CONCERNING: Antibiotic IV to Oral Route Change Policy  RECOMMENDATION: This patient is receiving metronidazole by the intravenous route.  Based on criteria approved by the Pharmacy and Therapeutics Committee, the antibiotic(s) is/are being converted to the equivalent oral dose form(s).   DESCRIPTION: These criteria include: Patient being treated for a respiratory tract infection, urinary tract infection, cellulitis or clostridium difficile associated diarrhea if on metronidazole The patient is not neutropenic and does not exhibit a GI malabsorption state The patient is eating (either orally or via tube) and/or has been taking other orally administered medications for a least 24 hours The patient is improving clinically and has a Tmax < 100.5  If you have questions about this conversion, please contact the Pharmacy Department  []   [phone] )  [phone] []   [phone] )   []   [phone] )  [facility] [x]   [phone] )  [facility]   [facility], PharmD 09/19/2021 9:49 AM

## 2021-09-19 NOTE — Progress Notes (Signed)
SATURATION QUALIFICATIONS: (This note is used to comply with regulatory documentation for home oxygen)  Patient Saturations on Room Air at Rest = 96%  Patient Saturations on Room Air while Ambulating = 93%  Pt ambulated 217ft in hallway. Pt denied any SOB, lightheadedness, and dizziness.

## 2021-09-19 NOTE — Evaluation (Signed)
Physical Therapy Evaluation Patient Details Name: [name] MRN: [id number] DOB: 09/21/1955 Today's Date: 09/19/2021  History of Present Illness  Pt is a 66yo male presenting to [facility] ED from podiatry clinic on 6/6 with chronic diabetic foot ulcer and fever.  MRI showed known neuropathic charcot foot with superficial wound, CXR negative for infection. Pt provided with diabetic shoe.  PMH: CADs/p cath, DM, GERD, HLD, HTN, OSA on CPAP, L-TKA 2019  Clinical Impression  Patient evaluated by Physical Therapy with no further acute PT needs identified. Pt modified independent for transfers, supervision for ambulation in hallway. Pt reports other than R foot wound he is at his baseline level of functioning. All education has been completed and the patient has no further questions.   See below for any follow-up Physical Therapy or equipment needs. PT is signing off. Thank you for this referral.        Recommendations for follow up therapy are one component of a multi-disciplinary discharge planning process, led by the attending physician.  Recommendations may be updated based on patient status, additional functional criteria and insurance authorization.  Follow Up Recommendations No PT follow up    Assistance Recommended at Discharge PRN  Patient can return home with the following  A little help with bathing/dressing/bathroom    Equipment Recommendations None recommended by PT  Recommendations for Other Services       Functional Status Assessment Patient has not had a recent decline in their functional status     Precautions / Restrictions Precautions Precautions: None Restrictions Weight Bearing Restrictions: No      Mobility  Bed Mobility               General bed mobility comments: Pt in recliner at entry and exit    Transfers Overall transfer level: Independent                      Ambulation/Gait Ambulation/Gait assistance: Supervision Gait Distance (Feet):  180 Feet Assistive device: None Gait Pattern/deviations: Decreased weight shift to right Gait velocity: decreasd     General Gait Details: Pt ambulated with supervision 144ft in hallway, no overt LOB noted or physical assist required. Demonstrated increased external rotation of R foot and decreased stance time on right.  Stairs            Wheelchair Mobility    Modified Rankin (Stroke Patients Only)       Balance Overall balance assessment: Modified Independent                                           Pertinent Vitals/Pain Pain Assessment Pain Assessment: No/denies pain    Home Living Family/patient expects to be discharged to:: Private residence Living Arrangements: Alone Available Help at Discharge: Family;Available 24 hours/day (Daughter and sister close by) Type of Home: House Home Access: Level entry       Home Layout: One level Home Equipment: Rollator (4 wheels);Rolling Walker (2 wheels);Cane - single point;Shower seat;Grab bars - toilet;Grab bars - tub/shower;Hand held shower head      Prior Function Prior Level of Function : Independent/Modified Independent             Mobility Comments: Ind ADLs Comments: ind     Hand Dominance        Extremity/Trunk Assessment   Upper Extremity Assessment Upper Extremity Assessment: Overall WFL for  tasks assessed    Lower Extremity Assessment Lower Extremity Assessment: Overall WFL for tasks assessed;RLE deficits/detail;LLE deficits/detail RLE Deficits / Details: Pt in diabetic shoe, did not test R ankle motion or strength secondary to wound. Grossly 4+/5 hip/knee. RLE Sensation: decreased proprioception;history of peripheral neuropathy LLE Deficits / Details: Grossly 4+/5 hip/knee/ank LLE Sensation: decreased proprioception;history of peripheral neuropathy    Cervical / Trunk Assessment Cervical / Trunk Assessment: Normal  Communication   Communication: No difficulties   Cognition Arousal/Alertness: Awake/alert Behavior During Therapy: WFL for tasks assessed/performed Overall Cognitive Status: Within Functional Limits for tasks assessed                                          General Comments      Exercises     Assessment/Plan    PT Assessment Patient does not need any further PT services  PT Problem List         PT Treatment Interventions      PT Goals (Current goals can be found in the Care Plan section)       Frequency       Co-evaluation               AM-PAC PT "6 Clicks" Mobility  Outcome Measure Help needed turning from your back to your side while in a flat bed without using bedrails?: None Help needed moving from lying on your back to sitting on the side of a flat bed without using bedrails?: None Help needed moving to and from a bed to a chair (including a wheelchair)?: None Help needed standing up from a chair using your arms (e.g., wheelchair or bedside chair)?: None Help needed to walk in hospital room?: A Little Help needed climbing 3-5 steps with a railing? : A Little 6 Click Score: 22    End of Session   Activity Tolerance: Patient tolerated treatment well;No increased pain Patient left: in chair;with call bell/phone within reach Nurse Communication: Mobility status PT Visit Diagnosis: Difficulty in walking, not elsewhere classified (R26.2)    Time: 9622-2979 PT Time Calculation (min) (ACUTE ONLY): 8 min   Charges:   PT Evaluation $PT Eval Low Complexity: 1 Low          Jamesetta Geralds, PT, DPT [facility] Department Office: [phone] Pager: [phone]  Evert Wenrich 09/19/2021, 11:39 AM

## 2021-09-19 NOTE — Progress Notes (Signed)
Orthopedic Tech Progress Note Patient Details:  [name] September 22, 1955 [id number]  Patient ID: [name], male   DOB: [birth year], 66 y.o.   MRN: [id number]  [name] 09/19/2021, 10:29 AM Right post op shoe applied

## 2021-09-19 NOTE — Hospital Course (Addendum)
66 year old with past medical history significant for CAD, hypertension, hyperlipidemia, obesity, OSA, gout, GERD, neuropathy, diabetes presents with chronic diabetic foot ulcer and fever.  Patient also report weakness and intermittent nausea for the past 2 days prior to admission. Patient was admitted for presumed sepsis likely diabetic foot at the source, MRI rule out osteomyelitis, podiatry was consulted.  Work-up with chest x-ray negative for infection, procalcitonin borderline 0.6, a.m. cortisol stable 19.4, blood culture no growth since admission D-dimer slightly elevated underwent a CTA chest to rule out PE due to concern. EP is rule out at this time patient can discharge her on oral Augmentin for a week and follow-up with Dr. Ardelle Anton in office next week.  Rest of the medical is clinically stable and will continue his home medication

## 2021-09-22 LAB — CULTURE, BLOOD (ROUTINE X 2)
Culture: NO GROWTH
Culture: NO GROWTH
Special Requests: ADEQUATE
Special Requests: ADEQUATE

## 2021-09-23 ENCOUNTER — Ambulatory Visit (INDEPENDENT_AMBULATORY_CARE_PROVIDER_SITE_OTHER): Payer: Medicare PPO | Admitting: Podiatry

## 2021-09-23 DIAGNOSIS — M79675 Pain in left toe(s): Secondary | ICD-10-CM | POA: Diagnosis not present

## 2021-09-23 DIAGNOSIS — M79674 Pain in right toe(s): Secondary | ICD-10-CM | POA: Diagnosis not present

## 2021-09-23 DIAGNOSIS — L97412 Non-pressure chronic ulcer of right heel and midfoot with fat layer exposed: Secondary | ICD-10-CM

## 2021-09-23 DIAGNOSIS — E1140 Type 2 diabetes mellitus with diabetic neuropathy, unspecified: Secondary | ICD-10-CM | POA: Diagnosis not present

## 2021-09-23 DIAGNOSIS — B351 Tinea unguium: Secondary | ICD-10-CM

## 2021-09-26 NOTE — Progress Notes (Signed)
Subjective: 66 year old male presents the office today for follow-up evaluation after being discharged in the hospital given ulceration of his right foot, sepsis.  He states he is doing much better and he still on his antibiotics will complete later this week.  Get some small mount of bloody drainage but no pus.  No increased swelling or redness.  Overall he is feeling much better.  Also asking for the nails be trimmed today as are thickened and long that he is not able to do this himself.  Objective: AAO x3, NAD DP/PT pulses palpable bilaterally, CRT less than 3 seconds Sensation decreased with Semmes Weinstein monofilament. Nails are hypertrophic, dystrophic, brittle, discolored, elongated 10. No surrounding redness or drainage. Tenderness nails 1-5 bilaterally.  On the plantar aspect of right foot it is a granular wound measure approximate 0.7 x 0.4 x 0.1 cm without any probing, undermining or tunneling.  Hyperkeratotic tissue adjacent to this.  There is no significant cellulitis noted there is no drainage or pus.  No fluctuance or crepitation.  No malodor. No pain with calf compression, swelling, warmth, erythema     Assessment: Ulceration right foot with resolving infection; symptomatic onychomycosis  Plan: -All treatment options discussed with the patient including all alternatives, risks, complications.  -Sharply debrided the nails x10 without any complications or bleeding. -Regards to the wound appears to be doing much better.  Appears the infection is improving as well.  [name] out the course of antibiotics which she will complete later this week and also continue with daily dressing changes.  Continue offloading. -Monitor for any clinical signs or symptoms of infection and directed to call the office immediately should any occur or go to the ER. -Patient encouraged to call the office with any questions, concerns, change in symptoms.   [name]

## 2021-09-27 ENCOUNTER — Ambulatory Visit: Payer: Medicare PPO | Admitting: Podiatry

## 2021-09-27 DIAGNOSIS — E11628 Type 2 diabetes mellitus with other skin complications: Secondary | ICD-10-CM | POA: Diagnosis not present

## 2021-09-27 DIAGNOSIS — L089 Local infection of the skin and subcutaneous tissue, unspecified: Secondary | ICD-10-CM | POA: Diagnosis not present

## 2021-10-08 ENCOUNTER — Ambulatory Visit: Payer: Medicare PPO | Admitting: Podiatry

## 2021-10-08 DIAGNOSIS — L97412 Non-pressure chronic ulcer of right heel and midfoot with fat layer exposed: Secondary | ICD-10-CM

## 2021-10-08 MED ORDER — PREGABALIN 150 MG PO CAPS: 150.0000 mg | ORAL_CAPSULE | Freq: Two times a day (BID) | ORAL | 0 refills | Status: DC

## 2021-10-21 NOTE — Progress Notes (Signed)
Subjective: 66 year old male presents the office today for follow-up evaluation of ulceration of the bottom of his right foot.  He is continue with daily dressing changes.  He gets some bleeding but no pus.  No recent swelling or redness that he reports.  Denies any fevers or chills.  He has no other concerns today.  Objective: AAO x3, NAD DP/PT pulses palpable bilaterally, CRT less than 3 seconds Sensation decreased with Semmes Weinstein monofilament. On the plantar aspect of right foot it is a granular wound measure approximate 0.5 x 0.3 x 0.1 cm without any probing, undermining or tunneling.  Pre and post wound measurements were about the same.  Hyperkeratotic tissue adjacent to this.  There is no significant cellulitis noted there is no drainage or pus.  No fluctuance or crepitation.  No malodor. No pain with calf compression, swelling, warmth, erythema      Assessment: Ulceration right foot with resolving infection-improving  Plan: -All treatment options discussed with the patient including all alternatives, risks, complications.  -Sharply debrided the wound utilizing with a #312 blade scalpel down to healthy, bleeding, granular tissue to remove any nonviable devitalized tissue to promote wound healing.  There was no significant bleeding.  Areas cleaned with saline and antibiotic ointment and a bandage applied.  Continue offloading and daily dressing changes. -Refilled Lyrica -Monitor for any clinical signs or symptoms of infection and directed to call the office immediately should any occur or go to the ER. -Patient encouraged to call the office with any questions, concerns, change in symptoms.   [name]

## 2021-10-25 ENCOUNTER — Other Ambulatory Visit: Payer: Self-pay | Admitting: Interventional Cardiology

## 2021-10-31 ENCOUNTER — Ambulatory Visit: Payer: Medicare PPO | Admitting: Podiatry

## 2021-10-31 DIAGNOSIS — L97412 Non-pressure chronic ulcer of right heel and midfoot with fat layer exposed: Secondary | ICD-10-CM

## 2021-10-31 NOTE — Progress Notes (Signed)
Subjective:  Chief Complaint  Patient presents with   Diabetic Ulcer    RIGHT FOOT ULCER , A1C- 7.2 BG-167, NO PAIN , NO DRAINAGE, PT STATES HE IS DOING BETTER THIS VISIT.     66 year old male presents the office today for follow-up evaluation of ulceration of the bottom of his right foot.  He is continue with daily dressing changes.  He gets some bleeding but no pus.  No recent swelling or redness that he reports.  Denies any fevers or chills.  He has no other concerns today.  Objective: AAO x3, NAD DP/PT pulses palpable bilaterally, CRT less than 3 seconds Sensation decreased with Semmes Weinstein monofilament. On the plantar medial aspect the right foot is hyperkeratotic tissue.  Upon debridement it appears there is still preulcerative but appears that the ulcer has healed.  There is no edema, erythema, drainage or pus or any signs of infection. No pain with calf compression, swelling, warmth, erythema       Assessment: Ulceration right foot with resolving infection-improving  Plan: -All treatment options discussed with the patient including all alternatives, risks, complications.  -Sharply debrided the hyperkeratotic lesion without any complications or bleeding.  Recommend continue moisturizer, offloading as well as padding.  Continue inserts. -Monitor for any clinical signs or symptoms of infection and directed to call the office immediately should any occur or go to the ER. -Patient encouraged to call the office with any questions, concerns, change in symptoms.   [name]

## 2021-11-05 ENCOUNTER — Other Ambulatory Visit: Payer: Self-pay | Admitting: Podiatry

## 2021-11-05 NOTE — Telephone Encounter (Signed)
Please advise 

## 2021-11-21 ENCOUNTER — Other Ambulatory Visit: Payer: Self-pay | Admitting: Interventional Cardiology

## 2021-11-28 ENCOUNTER — Ambulatory Visit: Payer: Medicare PPO | Admitting: Podiatry

## 2021-11-28 DIAGNOSIS — B351 Tinea unguium: Secondary | ICD-10-CM

## 2021-11-28 DIAGNOSIS — M79674 Pain in right toe(s): Secondary | ICD-10-CM

## 2021-11-28 DIAGNOSIS — E1140 Type 2 diabetes mellitus with diabetic neuropathy, unspecified: Secondary | ICD-10-CM | POA: Diagnosis not present

## 2021-11-28 DIAGNOSIS — M79675 Pain in left toe(s): Secondary | ICD-10-CM

## 2021-11-30 NOTE — Progress Notes (Signed)
Subjective:  Chief Complaint  Patient presents with   Diabetic Ulcer    Diabetic ulcer check, pt is doing fine        66 year old male presents the office today for follow-up evaluation of ulceration of the bottom of his right foot  Also for thick, elongated toes that he is unable to trim himself.  He feels that the wounds of healed.  No drainage or pus.  No new lesions are noted.  No other concerns.   Objective: AAO x3, NAD DP/PT pulses palpable bilaterally, CRT less than 3 seconds Sensation decreased with Semmes Weinstein monofilament. Nails are hypertrophic, dystrophic, brittle, discolored, elongated 10. No surrounding redness or drainage. Tenderness nails 1-5 bilaterally.  No open lesions.  There is no significant hyperkeratotic tissue noted along bilateral feet.  The wound on the right foot is healed No pain with calf compression, swelling, warmth, erythema  Assessment: Symptomatic onychomycosis, resolved ulceration right foot  Plan: -All treatment options discussed with the patient including all alternatives, risks, complications.  -Nails sharply debrided x10 without any complications or bleeding. -No significant callus to debride today.  Recommend moisturizer and offloading daily.  Continue with inserts for offloading. -Daily foot inspection, glucose control  [name]

## 2021-12-03 ENCOUNTER — Other Ambulatory Visit: Payer: Self-pay | Admitting: Interventional Cardiology

## 2021-12-03 DIAGNOSIS — K909 Intestinal malabsorption, unspecified: Secondary | ICD-10-CM | POA: Insufficient documentation

## 2021-12-03 DIAGNOSIS — M1460 Charcot's joint, unspecified site: Secondary | ICD-10-CM | POA: Insufficient documentation

## 2021-12-03 DIAGNOSIS — E1165 Type 2 diabetes mellitus with hyperglycemia: Secondary | ICD-10-CM | POA: Insufficient documentation

## 2021-12-04 ENCOUNTER — Encounter: Payer: Self-pay | Admitting: *Deleted

## 2021-12-04 NOTE — Congregational Nurse Program (Signed)
Spoke with patient. He is staying busy cleaning out garage and remodeling the house after the death of his wife in 10/15/2022.  Doing well as long as he is staying busy. Does have trouble sleeping at night when everything is quiet.  Has a hospital bed and trapeze stand and bar that the congregational nurses could them.  Will see if that is needed dme.  Encouraged [name] to seek out a support group -there is one at [employer]  for survivors of a death in the family.  Encouraged him to call and talk when he is feeling down and stress.  Stated that he will do that.

## 2021-12-13 ENCOUNTER — Other Ambulatory Visit: Payer: Self-pay | Admitting: Interventional Cardiology

## 2021-12-13 DIAGNOSIS — K9089 Other intestinal malabsorption: Secondary | ICD-10-CM | POA: Diagnosis not present

## 2021-12-13 DIAGNOSIS — E7800 Pure hypercholesterolemia, unspecified: Secondary | ICD-10-CM | POA: Diagnosis not present

## 2021-12-13 DIAGNOSIS — Z79899 Other long term (current) drug therapy: Secondary | ICD-10-CM | POA: Diagnosis not present

## 2021-12-13 DIAGNOSIS — I25119 Atherosclerotic heart disease of native coronary artery with unspecified angina pectoris: Secondary | ICD-10-CM | POA: Diagnosis not present

## 2021-12-13 DIAGNOSIS — G4733 Obstructive sleep apnea (adult) (pediatric): Secondary | ICD-10-CM | POA: Diagnosis not present

## 2021-12-13 DIAGNOSIS — Z23 Encounter for immunization: Secondary | ICD-10-CM | POA: Diagnosis not present

## 2021-12-13 DIAGNOSIS — Z0000 Encounter for general adult medical examination without abnormal findings: Secondary | ICD-10-CM | POA: Diagnosis not present

## 2021-12-13 DIAGNOSIS — E1142 Type 2 diabetes mellitus with diabetic polyneuropathy: Secondary | ICD-10-CM | POA: Diagnosis not present

## 2021-12-13 DIAGNOSIS — I10 Essential (primary) hypertension: Secondary | ICD-10-CM | POA: Diagnosis not present

## 2021-12-13 DIAGNOSIS — Z125 Encounter for screening for malignant neoplasm of prostate: Secondary | ICD-10-CM | POA: Diagnosis not present

## 2021-12-17 NOTE — Progress Notes (Signed)
Office Visit    Patient Name: [name] Date of Encounter: 12/17/2021  Primary Care Provider:  Merlene Laughter, MD Primary Cardiologist:  Lesleigh Noe, MD Primary Electrophysiologist: None  Chief Complaint    [name] is a 66 y.o. male with PMH of CAD s/p LHC with totally occluded RCA with left-to-right collaterals, DM, OSA (on CPAP), morbid obesity, HLD, HTN who presents today for follow-up of coronary artery disease.  Past Medical History    Past Medical History:  Diagnosis Date   Arthritis    "ankles, knees" (01/29/2018)   CAD in native artery    cath report from 02/2017 indicates patient had a history of RCA stent >10 years prior to that. At time of that cath he was found to have EF 70%, 20-30% ostial LAD, 30-40% prox LAD, 30-40% Cx, 80-90% Cx beyond OM1, totally occluded RCA with left-to-right collaterals.   Diabetes mellitus type 2 in obese (HCC)    GERD (gastroesophageal reflux disease)    Hyperlipidemia    Hypertension    Morbid obesity (HCC)    OSA on CPAP    Past Surgical History:  Procedure Laterality Date   CARDIAC CATHETERIZATION  2008   CORONARY ANGIOPLASTY WITH STENT PLACEMENT  1997   JOINT REPLACEMENT     KNEE ARTHROSCOPY Bilateral    "meniscus repairs"   TONSILLECTOMY     TOTAL KNEE ARTHROPLASTY Left 01/29/2018   TOTAL KNEE ARTHROPLASTY Left 01/29/2018   Procedure: LEFT TOTAL KNEE ARTHROPLASTY;  Surgeon: Tarry Kos, MD;  Location: [facility] OR;  Service: Orthopedics;  Laterality: Left;   UVULOPALATOPHARYNGOPLASTY  ~ 1994    Allergies  Allergies  Allergen Reactions   Metformin Other (See Comments)   Penicillins Rash    Has patient had a PCN reaction causing immediate rash, facial/tongue/throat swelling, SOB or lightheadedness with hypotension: Yes Has patient had a PCN reaction causing severe rash involving mucus membranes or skin necrosis: Unk Has patient had a PCN reaction that required hospitalization: Unk Has patient had a PCN  reaction occurring within the last 10 years: No If all of the above answers are "NO", then may proceed with Cephalosporin use.     History of Present Illness    [name] is a 66 year old male with the above-mentioned past medical history who presents today for follow-up of coronary artery disease.  [name] underwent LHC in 02/2007 due to history of RCA stent and was found to have a EF of 70%,20-30% ostial LAD, 30-40% prox LAD, 30-40% Cx, 80-90% Cx beyond OM1, totally occluded RCA with left-to-right collaterals.  He was last seen by Dr. Katrinka Blazing on 11/2020 and had some complaint of angina with frequent activity.  Nuclear stress test was performed due to new complaint of angina on 12/2020 and was low risk and stable with no ischemia.  No other medication changes made at that time.  He was hospitalized following his wife's death for sepsis. He was found to have neuropathic/Charcot arthropathy.  He is currently followed by Dr. Loreta Ave with [facility].  [name] presents today alone for follow-up. Since last being seen in the office patient reports he has been doing well and has occasional angina with some activities later in the evening that passes with rest.  He is currently being followed by [facility] for treatment of Charcot arthropathy. Patient denies chest pain, palpitations, dyspnea, PND, orthopnea, nausea, vomiting, dizziness, syncope, edema, weight gain, or early satiety.  Home Medications    Current Outpatient Medications  Medication Sig Dispense Refill   acetaminophen (TYLENOL) 650 MG CR tablet Take 1,300 mg by mouth at bedtime.     aspirin EC 325 MG tablet Take 325 mg by mouth daily.     atorvastatin (LIPITOR) 80 MG tablet Take 80 mg by mouth at bedtime.      celecoxib (CELEBREX) 200 MG capsule TAKE 1 CAPSULE BY MOUTH EVERY DAY 30 capsule 1   Cholecalciferol (VITAMIN D3) 2000 units TABS Take 2,000 Units by mouth daily.     empagliflozin (JARDIANCE) 25 MG TABS  tablet Take 25 mg by mouth daily.     glimepiride (AMARYL) 2 MG tablet Take 2 mg by mouth daily.     Glucosamine-Chondroitin (COSAMIN DS PO) Take 2 tablets by mouth daily with breakfast.      isosorbide mononitrate (IMDUR) 120 MG 24 hr tablet TAKE 1 TABLET BY MOUTH EVERY DAY 90 tablet 2   lisinopril (PRINIVIL,ZESTRIL) 5 MG tablet Take 5 mg by mouth at bedtime.  (Patient not taking: Reported on 10/31/2021)     Menthol, Topical Analgesic, 4 % GEL Apply 1 application topically See admin instructions. Apply to both feet at bedtime for neuropathy     metoprolol succinate (TOPROL-XL) 100 MG 24 hr tablet Take 1 tablet (100 mg total) by mouth daily. Please call to schedule an overdue appointment with Dr. Katrinka Blazing for refills, [phone], thank you. 3RD attempt. NO MORE REFILLS GIVEN 15 tablet 0   Multiple Vitamin (MULTIVITAMIN) tablet Take 1 tablet by mouth daily.     nitroGLYCERIN (NITROSTAT) 0.4 MG SL tablet Place 1 tablet (0.4 mg total) under the tongue every 5 (five) minutes x 3 doses as needed for chest pain. 25 tablet 3   NON FORMULARY [facility]  Peripheral Neuropathy Cream- Bupivacaine 1%, Doxepin 3%, Gabapentin 6%, Pentoxifylline 3%, Topiramate 1% Apply 1-2 grams to affected area 3-4 times daily Qty. 120 gm 3 refills     omeprazole (PRILOSEC) 20 MG capsule Take 20 mg by mouth daily.      pregabalin (LYRICA) 150 MG capsule Take 1 capsule (150 mg total) by mouth 2 (two) times daily. 180 capsule 0   senna-docusate (SENOKOT S) 8.6-50 MG tablet Take 1 tablet by mouth at bedtime as needed. 30 tablet 1   tamsulosin (FLOMAX) 0.4 MG CAPS capsule      Tetrahydrozoline HCl (VISINE OP) Place 1 drop into both eyes daily as needed (for dry or irritated eyes).      triamterene-hydrochlorothiazide (MAXZIDE) 75-50 MG per tablet Take 0.5 tablets by mouth daily.      WELCHOL 625 MG tablet Take 1,875 mg by mouth at bedtime.      No current facility-administered medications for this visit.     Review of  Systems  Please see the history of present illness.    (+) Right foot  All other systems reviewed and are otherwise negative except as noted above.  Physical Exam    Wt Readings from Last 3 Encounters:  09/18/21 278 lb 6.4 oz (126.3 kg)  08/13/21 279 lb (126.6 kg)  12/13/20 267 lb (121.1 kg)   VZ:CHYIF were no vitals filed for this visit.,There is no height or weight on file to calculate BMI.  Constitutional:      Appearance: Healthy appearance. Not in distress.  Neck:     Vascular: JVD normal.  Pulmonary:     Effort: Pulmonary effort is normal.     Breath sounds: No wheezing. No  rales. Diminished in the bases Cardiovascular:     Normal rate. Regular rhythm. Normal S1. Normal S2.      Murmurs: There is no murmur.  Edema:    Peripheral edema absent.  Abdominal:     Palpations: Abdomen is soft non tender. There is no hepatomegaly.  Skin:    General: Skin is warm and dry.  Neurological:     General: No focal deficit present.     Mental Status: Alert and oriented to person, place and time.     Cranial Nerves: Cranial nerves are intact.  EKG/LABS/Other Studies Reviewed    ECG personally reviewed by me today -none completed toda   Lab Results  Component Value Date   WBC 7.1 09/18/2021   HGB 14.3 09/18/2021   HCT 43.6 09/18/2021   MCV 91.0 09/18/2021   PLT 121 (L) 09/18/2021   Lab Results  Component Value Date   CREATININE 0.99 09/18/2021   BUN 22 09/18/2021   NA 139 09/18/2021   K 3.7 09/18/2021   CL 104 09/18/2021   CO2 25 09/18/2021   Lab Results  Component Value Date   ALT 25 09/18/2021   [name] 21 09/18/2021   ALKPHOS 54 09/18/2021   BILITOT 0.8 09/18/2021   Lab Results  Component Value Date   CHOL  03/04/2007    165        ATP [name] CLASSIFICATION:  <200     mg/dL   Desirable  200-239  mg/dL   Borderline High  >=240    mg/dL   High   HDL 30 (L) 03/04/2007   LDLCALC  03/04/2007    61        Total Cholesterol/HDL:CHD Risk Coronary Heart Disease Risk  Table                     Men   Women  1/2 Average Risk   3.4   3.3   TRIG 368 (H) 03/04/2007   CHOLHDL 5.5 03/04/2007    Lab Results  Component Value Date   HGBA1C 6.8 (H) 01/29/2018    Assessment & Plan    1.  Coronary artery disease: -s/p LHC with totally occluded RCA with left-to-right collaterals -GDMT with ASA 325, Lipitor 80 mg, Imdur 120 mg, Toprol 100 mg daily -Today patient reports no chest pain but does have occasional angina with exertion. -He was instructed to take an extra Imdur 120 mg  if pain is persistent.  2.  Hyperlipidemia: -LDL cholesterol was 81 slightly above goal less than 70 -Continue WelChol 625 mg, Lipitor 80 mg  3.  Hypertension: -Patient's blood pressure today was well controlled at 122/68 -Continue Maxzide 75 mg/50 mg  4.  OSA on CPAP: -Patient reports compliance today  5.  DM type II: -Followed by PCP Continue Jardiance, glimepiride,  Disposition: Follow-up with Levi Crome III, MD or APP in 12 months   Medication Adjustments/Labs and Tests Ordered: Current medicines are reviewed at length with the patient today.  Concerns regarding medicines are outlined above.   Signed, Mable Fill, Marissa Nestle, NP 12/17/2021, 2:39 PM [place]

## 2021-12-20 ENCOUNTER — Ambulatory Visit: Payer: Medicare PPO | Attending: Nurse Practitioner | Admitting: Nurse Practitioner

## 2021-12-20 ENCOUNTER — Encounter: Payer: Self-pay | Admitting: Nurse Practitioner

## 2021-12-20 VITALS — BP 122/68 | HR 72 | Ht 68.0 in | Wt 275.2 lb

## 2021-12-20 DIAGNOSIS — I10 Essential (primary) hypertension: Secondary | ICD-10-CM | POA: Diagnosis not present

## 2021-12-20 DIAGNOSIS — I2510 Atherosclerotic heart disease of native coronary artery without angina pectoris: Secondary | ICD-10-CM

## 2021-12-20 DIAGNOSIS — E782 Mixed hyperlipidemia: Secondary | ICD-10-CM | POA: Diagnosis not present

## 2021-12-20 DIAGNOSIS — G4733 Obstructive sleep apnea (adult) (pediatric): Secondary | ICD-10-CM

## 2021-12-20 DIAGNOSIS — Z9989 Dependence on other enabling machines and devices: Secondary | ICD-10-CM | POA: Diagnosis not present

## 2021-12-20 NOTE — Patient Instructions (Signed)
Medication Instructions:   Your physician recommends that you continue on your current medications as directed. Please refer to the Current Medication list given to you today.  *If you need a refill on your cardiac medications before your next appointment, please call your pharmacy*   Lab Work:  None ordered.  If you have labs (blood work) drawn today and your tests are completely normal, you will receive your results only by: MyChart Message (if you have MyChart) OR A paper copy in the mail If you have any lab test that is abnormal or we need to change your treatment, we will call you to review the results.   Testing/Procedures:  None ordered.   Follow-Up: At [facility], you and your health needs are our priority.  As part of our continuing mission to provide you with exceptional heart care, we have created designated Provider Care Teams.  These Care Teams include your primary Cardiologist (physician) and Advanced Practice Providers (APPs -  Physician Assistants and Nurse Practitioners) who all work together to provide you with the care you need, when you need it.  We recommend signing up for the patient portal called "MyChart".  Sign up information is provided on this After Visit Summary.  MyChart is used to connect with patients for Virtual Visits (Telemedicine).  Patients are able to view lab/test results, encounter notes, upcoming appointments, etc.  Non-urgent messages can be sent to your provider as well.   To learn more about what you can do with MyChart, go to [email or web address].    Your next appointment:   1 year(s)  The format for your next appointment:   In Person  Provider:   Ernest Dick, NP         Other Instructions  Your physician wants you to follow-up in: 1 year with Ernest Dick, NP. You will receive a reminder letter in the mail two months in advance. If you don't receive a letter, please call our office to schedule the follow-up  appointment.   Important Information About Sugar       

## 2022-01-01 ENCOUNTER — Other Ambulatory Visit: Payer: Self-pay | Admitting: Podiatry

## 2022-01-07 ENCOUNTER — Other Ambulatory Visit: Payer: Self-pay | Admitting: Interventional Cardiology

## 2022-01-08 ENCOUNTER — Other Ambulatory Visit: Payer: Self-pay | Admitting: Interventional Cardiology

## 2022-01-09 ENCOUNTER — Other Ambulatory Visit: Payer: Self-pay | Admitting: Interventional Cardiology

## 2022-01-09 DIAGNOSIS — E1142 Type 2 diabetes mellitus with diabetic polyneuropathy: Secondary | ICD-10-CM

## 2022-01-09 MED ORDER — EMPAGLIFLOZIN 25 MG PO TABS: 25.0000 mg | ORAL_TABLET | Freq: Every day | ORAL | 3 refills | Status: DC

## 2022-01-24 ENCOUNTER — Ambulatory Visit (INDEPENDENT_AMBULATORY_CARE_PROVIDER_SITE_OTHER): Payer: Medicare PPO

## 2022-01-24 ENCOUNTER — Ambulatory Visit: Payer: Medicare PPO | Admitting: Podiatry

## 2022-01-24 DIAGNOSIS — L97529 Non-pressure chronic ulcer of other part of left foot with unspecified severity: Secondary | ICD-10-CM | POA: Diagnosis not present

## 2022-01-24 DIAGNOSIS — E1140 Type 2 diabetes mellitus with diabetic neuropathy, unspecified: Secondary | ICD-10-CM | POA: Diagnosis not present

## 2022-01-24 DIAGNOSIS — L03116 Cellulitis of left lower limb: Secondary | ICD-10-CM | POA: Diagnosis not present

## 2022-01-24 DIAGNOSIS — L97412 Non-pressure chronic ulcer of right heel and midfoot with fat layer exposed: Secondary | ICD-10-CM

## 2022-01-24 MED ORDER — DOXYCYCLINE HYCLATE 100 MG PO TABS: 100.0000 mg | ORAL_TABLET | Freq: Two times a day (BID) | ORAL | 0 refills | Status: DC

## 2022-01-25 ENCOUNTER — Emergency Department (HOSPITAL_COMMUNITY): Payer: Medicare PPO

## 2022-01-25 ENCOUNTER — Other Ambulatory Visit: Payer: Self-pay

## 2022-01-25 ENCOUNTER — Encounter (HOSPITAL_COMMUNITY): Payer: Self-pay

## 2022-01-25 ENCOUNTER — Inpatient Hospital Stay (HOSPITAL_COMMUNITY)
Admission: EM | Admit: 2022-01-25 | Discharge: 2022-01-27 | DRG: 638 | Disposition: A | Discharge status: Home / Self Care | Payer: Medicare PPO | Attending: Family Medicine | Admitting: Family Medicine

## 2022-01-25 DIAGNOSIS — G8929 Other chronic pain: Secondary | ICD-10-CM | POA: Diagnosis present

## 2022-01-25 DIAGNOSIS — I959 Hypotension, unspecified: Secondary | ICD-10-CM | POA: Diagnosis not present

## 2022-01-25 DIAGNOSIS — E872 Acidosis, unspecified: Secondary | ICD-10-CM | POA: Diagnosis present

## 2022-01-25 DIAGNOSIS — L089 Local infection of the skin and subcutaneous tissue, unspecified: Secondary | ICD-10-CM | POA: Diagnosis present

## 2022-01-25 DIAGNOSIS — Z880 Allergy status to penicillin: Secondary | ICD-10-CM | POA: Diagnosis not present

## 2022-01-25 DIAGNOSIS — Z96652 Presence of left artificial knee joint: Secondary | ICD-10-CM | POA: Diagnosis present

## 2022-01-25 DIAGNOSIS — I2510 Atherosclerotic heart disease of native coronary artery without angina pectoris: Secondary | ICD-10-CM | POA: Diagnosis present

## 2022-01-25 DIAGNOSIS — J9811 Atelectasis: Secondary | ICD-10-CM | POA: Diagnosis present

## 2022-01-25 DIAGNOSIS — M7989 Other specified soft tissue disorders: Secondary | ICD-10-CM | POA: Diagnosis not present

## 2022-01-25 DIAGNOSIS — E1140 Type 2 diabetes mellitus with diabetic neuropathy, unspecified: Secondary | ICD-10-CM | POA: Diagnosis present

## 2022-01-25 DIAGNOSIS — R509 Fever, unspecified: Secondary | ICD-10-CM

## 2022-01-25 DIAGNOSIS — K219 Gastro-esophageal reflux disease without esophagitis: Secondary | ICD-10-CM | POA: Diagnosis present

## 2022-01-25 DIAGNOSIS — Z1152 Encounter for screening for COVID-19: Secondary | ICD-10-CM

## 2022-01-25 DIAGNOSIS — Z8249 Family history of ischemic heart disease and other diseases of the circulatory system: Secondary | ICD-10-CM

## 2022-01-25 DIAGNOSIS — L97425 Non-pressure chronic ulcer of left heel and midfoot with muscle involvement without evidence of necrosis: Secondary | ICD-10-CM | POA: Diagnosis present

## 2022-01-25 DIAGNOSIS — E11621 Type 2 diabetes mellitus with foot ulcer: Principal | ICD-10-CM | POA: Diagnosis present

## 2022-01-25 DIAGNOSIS — Z791 Long term (current) use of non-steroidal anti-inflammatories (NSAID): Secondary | ICD-10-CM

## 2022-01-25 DIAGNOSIS — I2582 Chronic total occlusion of coronary artery: Secondary | ICD-10-CM | POA: Diagnosis present

## 2022-01-25 DIAGNOSIS — L97529 Non-pressure chronic ulcer of other part of left foot with unspecified severity: Secondary | ICD-10-CM | POA: Diagnosis not present

## 2022-01-25 DIAGNOSIS — M1990 Unspecified osteoarthritis, unspecified site: Secondary | ICD-10-CM | POA: Diagnosis present

## 2022-01-25 DIAGNOSIS — I517 Cardiomegaly: Secondary | ICD-10-CM | POA: Diagnosis not present

## 2022-01-25 DIAGNOSIS — Z7984 Long term (current) use of oral hypoglycemic drugs: Secondary | ICD-10-CM

## 2022-01-25 DIAGNOSIS — E1142 Type 2 diabetes mellitus with diabetic polyneuropathy: Secondary | ICD-10-CM | POA: Diagnosis present

## 2022-01-25 DIAGNOSIS — Z7982 Long term (current) use of aspirin: Secondary | ICD-10-CM | POA: Diagnosis not present

## 2022-01-25 DIAGNOSIS — E11628 Type 2 diabetes mellitus with other skin complications: Secondary | ICD-10-CM

## 2022-01-25 DIAGNOSIS — Z955 Presence of coronary angioplasty implant and graft: Secondary | ICD-10-CM | POA: Diagnosis not present

## 2022-01-25 DIAGNOSIS — Z888 Allergy status to other drugs, medicaments and biological substances status: Secondary | ICD-10-CM

## 2022-01-25 DIAGNOSIS — N390 Urinary tract infection, site not specified: Secondary | ICD-10-CM | POA: Diagnosis not present

## 2022-01-25 DIAGNOSIS — Z79899 Other long term (current) drug therapy: Secondary | ICD-10-CM | POA: Diagnosis not present

## 2022-01-25 DIAGNOSIS — E785 Hyperlipidemia, unspecified: Secondary | ICD-10-CM | POA: Diagnosis present

## 2022-01-25 DIAGNOSIS — M109 Gout, unspecified: Secondary | ICD-10-CM | POA: Diagnosis present

## 2022-01-25 DIAGNOSIS — M19072 Primary osteoarthritis, left ankle and foot: Secondary | ICD-10-CM | POA: Diagnosis not present

## 2022-01-25 DIAGNOSIS — G4733 Obstructive sleep apnea (adult) (pediatric): Secondary | ICD-10-CM | POA: Diagnosis present

## 2022-01-25 DIAGNOSIS — L97422 Non-pressure chronic ulcer of left heel and midfoot with fat layer exposed: Secondary | ICD-10-CM | POA: Diagnosis not present

## 2022-01-25 DIAGNOSIS — I10 Essential (primary) hypertension: Secondary | ICD-10-CM | POA: Diagnosis present

## 2022-01-25 DIAGNOSIS — Z23 Encounter for immunization: Secondary | ICD-10-CM

## 2022-01-25 DIAGNOSIS — E119 Type 2 diabetes mellitus without complications: Secondary | ICD-10-CM

## 2022-01-25 LAB — URINALYSIS, ROUTINE W REFLEX MICROSCOPIC
Bilirubin Urine: NEGATIVE
Glucose, UA: >=500 mg/dL — AB
Hgb urine dipstick: NEGATIVE
Ketones, ur: NEGATIVE mg/dL
Nitrite: NEGATIVE
Protein, ur: 30 mg/dL — AB
Specific Gravity, Urine: 1.03 (ref 1.005–1.030)
pH: 5 (ref 5.0–8.0)

## 2022-01-25 LAB — CBC WITH DIFFERENTIAL/PLATELET
Abs Immature Granulocytes: 0.03 10*3/uL (ref 0.00–0.07)
Basophils Absolute: 0.1 10*3/uL (ref 0.0–0.1)
Basophils Relative: 1 %
Eosinophils Absolute: 0 10*3/uL (ref 0.0–0.5)
Eosinophils Relative: 0 %
HCT: 50.5 % (ref 39.0–52.0)
Hemoglobin: 16.5 g/dL (ref 13.0–17.0)
Immature Granulocytes: 0 %
Lymphocytes Relative: 4 %
Lymphs Abs: 0.4 10*3/uL — ABNORMAL LOW (ref 0.7–4.0)
MCH: 29.2 pg (ref 26.0–34.0)
MCHC: 32.7 g/dL (ref 30.0–36.0)
MCV: 89.4 fL (ref 80.0–100.0)
Monocytes Absolute: 0.5 10*3/uL (ref 0.1–1.0)
Monocytes Relative: 5 %
Neutro Abs: 8.3 10*3/uL — ABNORMAL HIGH (ref 1.7–7.7)
Neutrophils Relative %: 90 %
Platelets: 151 10*3/uL (ref 150–400)
RBC: 5.65 MIL/uL (ref 4.22–5.81)
RDW: 15.2 % (ref 11.5–15.5)
WBC: 9.2 10*3/uL (ref 4.0–10.5)
nRBC: 0 % (ref 0.0–0.2)

## 2022-01-25 LAB — RESP PANEL BY RT-PCR (FLU A&B, COVID) ARPGX2
Influenza A by PCR: NEGATIVE
Influenza B by PCR: NEGATIVE
SARS Coronavirus 2 by RT PCR: NEGATIVE

## 2022-01-25 LAB — COMPREHENSIVE METABOLIC PANEL
ALT: 29 U/L (ref 0–44)
AST: 31 U/L (ref 15–41)
Albumin: 3.8 g/dL (ref 3.5–5.0)
Alkaline Phosphatase: 75 U/L (ref 38–126)
Anion gap: 10 (ref 5–15)
BUN: 18 mg/dL (ref 8–23)
CO2: 26 mmol/L (ref 22–32)
Calcium: 9 mg/dL (ref 8.9–10.3)
Chloride: 103 mmol/L (ref 98–111)
Creatinine, Ser: 1.19 mg/dL (ref 0.61–1.24)
GFR, Estimated: >60 mL/min (ref >60)
Glucose, Bld: 128 mg/dL — ABNORMAL HIGH (ref 70–99)
Potassium: 3.6 mmol/L (ref 3.5–5.1)
Sodium: 139 mmol/L (ref 135–145)
Total Bilirubin: 1.1 mg/dL (ref 0.3–1.2)
Total Protein: 7.6 g/dL (ref 6.5–8.1)

## 2022-01-25 LAB — PROTIME-INR
INR: 1.1 (ref 0.8–1.2)
Prothrombin Time: 13.7 seconds (ref 11.4–15.2)

## 2022-01-25 LAB — LACTIC ACID, PLASMA
Lactic Acid, Venous: 2.3 mmol/L (ref 0.5–1.9)
Lactic Acid, Venous: 1.6 mmol/L (ref 0.5–1.9)

## 2022-01-25 LAB — MAGNESIUM: Magnesium: 1.9 mg/dL (ref 1.7–2.4)

## 2022-01-25 LAB — SEDIMENTATION RATE: Sed Rate: 7 mm/hr (ref 0–16)

## 2022-01-25 MED ORDER — VANCOMYCIN HCL 2000 MG/400ML IV SOLN
2000.0000 mg | Freq: Once | INTRAVENOUS | Status: AC
  Administered 2022-01-25: 2000 mg via INTRAVENOUS
  Filled 2022-01-25: qty 400

## 2022-01-25 MED ORDER — LACTATED RINGERS IV BOLUS (SEPSIS)
1000.0000 mL | Freq: Once | INTRAVENOUS | Status: AC
  Administered 2022-01-25: 1000 mL via INTRAVENOUS

## 2022-01-25 MED ORDER — VANCOMYCIN HCL IN DEXTROSE 1-5 GM/200ML-% IV SOLN: 1000.0000 mg | Freq: Once | INTRAVENOUS | Status: DC

## 2022-01-25 MED ORDER — IOHEXOL 350 MG/ML SOLN
100.0000 mL | Freq: Once | INTRAVENOUS | Status: AC | PRN
  Administered 2022-01-25: 100 mL via INTRAVENOUS

## 2022-01-25 MED ORDER — LACTATED RINGERS IV SOLN: INTRAVENOUS | Status: DC

## 2022-01-25 MED ORDER — ACETAMINOPHEN 500 MG PO TABS
1000.0000 mg | ORAL_TABLET | Freq: Once | ORAL | Status: AC
  Administered 2022-01-25: 1000 mg via ORAL
  Filled 2022-01-25: qty 2

## 2022-01-25 MED ORDER — METRONIDAZOLE 500 MG/100ML IV SOLN
500.0000 mg | Freq: Once | INTRAVENOUS | Status: AC
  Administered 2022-01-25: 500 mg via INTRAVENOUS
  Filled 2022-01-25: qty 100

## 2022-01-25 MED ORDER — SODIUM CHLORIDE 0.9 % IV SOLN
2.0000 g | Freq: Once | INTRAVENOUS | Status: AC
  Administered 2022-01-25: 2 g via INTRAVENOUS
  Filled 2022-01-25: qty 12.5

## 2022-01-25 MED ORDER — GADOBUTROL 1 MMOL/ML IV SOLN
10.0000 mL | Freq: Once | INTRAVENOUS | Status: AC | PRN
  Administered 2022-01-25: 10 mL via INTRAVENOUS

## 2022-01-25 NOTE — Progress Notes (Signed)
A consult was received from an ED physician for Vanco, Cefepime per pharmacy dosing.  The patient's profile has been reviewed for ht/wt/allergies/indication/available labs.   A one time order has been placed for Cefepime 2g IV x 1 and Vanco 2g IV x 1.  Further antibiotics/pharmacy consults should be ordered by admitting physician if indicated.                       Zyrion Coey S. Alford Highland, PharmD, BCPS Clinical Staff Pharmacist [email or web address]  Thank you, Wayland Salinas 01/25/2022  7:52 PM

## 2022-01-25 NOTE — ED Triage Notes (Signed)
EMS reports from home, called out for worsening wound check left foot. Wound began Wednesday.   BP 106/46 HR 90 RR 20 Sp02 91 RA Temp 101.6

## 2022-01-25 NOTE — ED Provider Notes (Signed)
[place] DEPT Provider Note   CSN: [id number] Arrival date & time: 01/25/22  1738     History  Chief Complaint  Patient presents with   Wound Check   Fever    [name] is a 66 y.o. male.  HPI Patient presents for fever.  Medical history includes CAD, HTN, HLD, OSA, arthritis, peripheral neuropathy, GERD, DM.  He noticed a wound to the plantar surface of his left foot 3 days ago.  [facility].  He has a history of the same on his right foot, which has since healed.  He does not have any associated pain but he attributes the lack of pain to his peripheral neuropathy.  Patient was otherwise in his normal state of health yesterday.  Last night, he developed subjective fevers, chills, fatigue, and generalized weakness.  This continued today.  For his reason, patient presents to the ED.  He denies any recent fluid losses.  He states that his sugars have been within normal range at home.    Home Medications Prior to Admission medications   Medication Sig Start Date End Date Taking? Authorizing Provider  acetaminophen (TYLENOL) 650 MG CR tablet Take 1,300 mg by mouth at bedtime.    [provider]  aspirin EC 325 MG tablet Take 325 mg by mouth daily.    [provider]  atorvastatin (LIPITOR) 80 MG tablet Take 80 mg by mouth at bedtime.  02/09/13   [provider]  celecoxib (CELEBREX) 200 MG capsule TAKE 1 CAPSULE BY MOUTH EVERY DAY 11/05/21   Trula Slade, DPM  Cholecalciferol (VITAMIN D3) 2000 units TABS Take 2,000 Units by mouth daily.    [provider]  doxycycline (VIBRA-TABS) 100 MG tablet Take 1 tablet (100 mg total) by mouth 2 (two) times daily. 01/24/22   Trula Slade, DPM  empagliflozin (JARDIANCE) 25 MG TABS tablet Take 1 tablet (25 mg total) by mouth daily. 01/09/22   Belva Crome, MD  glimepiride (AMARYL) 2 MG tablet Take 2 mg by mouth daily.  12/03/20   [provider]  Glucosamine-Chondroitin (COSAMIN DS PO) Take 2 tablets by mouth daily with breakfast.     [provider]  isosorbide mononitrate (IMDUR) 120 MG 24 hr tablet TAKE 1 TABLET BY MOUTH EVERY DAY 01/08/22   Belva Crome, MD  Menthol, Topical Analgesic, 4 % GEL Apply 1 application topically See admin instructions. Apply to both feet at bedtime for neuropathy    [provider]  metoprolol succinate (TOPROL-XL) 100 MG 24 hr tablet TAKE 1 TABLET BY MOUTH DAILY. PLEASE CALL TO SCHEDULE AN OVERDUE APPOINTMENT WITH DR. Tamala Julian FOR REFILLS, [phone], THANK YOU. 3RD ATTEMPT. NO MORE REFILLS GIVEN 01/07/22   Belva Crome, MD  Multiple Vitamin (MULTIVITAMIN) tablet Take 1 tablet by mouth daily.    [provider]  nitroGLYCERIN (NITROSTAT) 0.4 MG SL tablet Place 1 tablet (0.4 mg total) under the tongue every 5 (five) minutes x 3 doses as needed for chest pain. 12/07/20   Belva Crome, MD  NON [facility]  Peripheral Neuropathy Cream- Bupivacaine 1%, Doxepin 3%, Gabapentin 6%, Pentoxifylline 3%, Topiramate 1% Apply 1-2 grams to affected area 3-4 times daily Qty. 120 gm 3 refills    [provider]  omeprazole (PRILOSEC) 20 MG capsule Take 20 mg by mouth daily.  02/03/13   [provider]  pregabalin (LYRICA) 150 MG capsule  Take 1 capsule (150 mg total) by mouth 2 (two) times daily. 10/08/21   Vivi Barrack, DPM  senna-docusate (SENOKOT S) 8.6-50 MG tablet Take 1 tablet by mouth at bedtime as needed. 01/29/18   Tarry Kos, MD  tamsulosin (FLOMAX) 0.4 MG CAPS capsule  11/23/19   [provider]  Tetrahydrozoline HCl (VISINE OP) Place 1 drop into both eyes daily as needed (for dry or irritated eyes).     [provider]  triamterene-hydrochlorothiazide (MAXZIDE) 75-50 MG per tablet Take 0.5 tablets by mouth daily.  01/06/13   [provider]  WELCHOL 625 MG tablet Take 1,875 mg by  mouth at bedtime.  05/19/13   [provider]      Allergies    Metformin and Penicillins    Review of Systems   Review of Systems  Constitutional:  Positive for chills, fatigue and fever.  Skin:  Positive for wound.  Neurological:  Positive for weakness (Generalized).  All other systems reviewed and are negative.   Physical Exam Updated Vital Signs BP (!) 141/78   Pulse 84   Temp 98.7 F (37.1 C) (Oral)   Resp 20   Wt 124.9 kg   SpO2 91%   BMI 41.86 kg/m  Physical Exam Vitals and nursing note reviewed.  Constitutional:      General: He is not in acute distress.    Appearance: Normal appearance. He is well-developed. He is not ill-appearing, toxic-appearing or diaphoretic.  HENT:     Head: Normocephalic and atraumatic.     Right Ear: External ear normal.     Left Ear: External ear normal.     Nose: Nose normal.     Mouth/Throat:     Mouth: Mucous membranes are moist.     Pharynx: Oropharynx is clear.  Eyes:     Extraocular Movements: Extraocular movements intact.     Conjunctiva/sclera: Conjunctivae normal.  Cardiovascular:     Rate and Rhythm: Normal rate and regular rhythm.     Heart sounds: No murmur heard. Pulmonary:     Effort: Pulmonary effort is normal. No respiratory distress.     Breath sounds: Normal breath sounds. No wheezing or rales.  Chest:     Chest wall: No tenderness.  Abdominal:     Palpations: Abdomen is soft.     Tenderness: There is no abdominal tenderness.  Musculoskeletal:        General: No swelling. Normal range of motion.     Cervical back: Normal range of motion and neck supple.     Right lower leg: No edema.     Left lower leg: No edema.  Skin:    General: Skin is warm and dry.     Coloration: Skin is not jaundiced or pale.     Comments: Ulcer to plantar surface of left foot  Neurological:     General: No focal deficit present.     Mental Status: He is alert and oriented to person, place, and time.     Cranial Nerves:  No cranial nerve deficit.     Sensory: No sensory deficit.     Motor: No weakness.     Coordination: Coordination normal.  Psychiatric:        Mood and Affect: Mood normal.        Behavior: Behavior normal.        Thought Content: Thought content normal.        Judgment: Judgment normal.      ED  Results / Procedures / Treatments   Labs (all labs ordered are listed, but only abnormal results are displayed) Labs Reviewed  LACTIC ACID, PLASMA - Abnormal; Notable for the following components:      Result Value   Lactic Acid, Venous 2.3 (*)    All other components within normal limits  COMPREHENSIVE METABOLIC PANEL - Abnormal; Notable for the following components:   Glucose, Bld 128 (*)    All other components within normal limits  CBC WITH DIFFERENTIAL/PLATELET - Abnormal; Notable for the following components:   Neutro Abs 8.3 (*)    Lymphs Abs 0.4 (*)    All other components within normal limits  URINALYSIS, ROUTINE W REFLEX MICROSCOPIC - Abnormal; Notable for the following components:   APPearance HAZY (*)    Glucose, UA >=500 (*)    Protein, ur 30 (*)    Leukocytes,Ua LARGE (*)    Bacteria, UA RARE (*)    All other components within normal limits  CULTURE, BLOOD (ROUTINE X 2)  RESP PANEL BY RT-PCR (FLU A&B, COVID) ARPGX2  CULTURE, BLOOD (ROUTINE X 2)  URINE CULTURE  LACTIC ACID, PLASMA  PROTIME-INR  MAGNESIUM  SEDIMENTATION RATE  C-REACTIVE PROTEIN    EKG EKG Interpretation  Date/Time:  Saturday January 25 2022 18:23:42 EDT Ventricular Rate:  88 PR Interval:  195 QRS Duration: 102 QT Interval:  360 QTC Calculation: 436 R Axis:   -52 Text Interpretation: Sinus rhythm No significant change since last tracing Confirmed by Gloris Manchester ([id number]) on 01/25/2022 8:49:44 PM  Radiology CT Angio Chest PE W and/or Wo Contrast  Result Date: 01/25/2022 CLINICAL DATA:  Pulmonary embolism (PE) suspected, high prob EXAM: CT ANGIOGRAPHY CHEST WITH CONTRAST TECHNIQUE:  Multidetector CT imaging of the chest was performed using the standard protocol during bolus administration of intravenous contrast. Multiplanar CT image reconstructions and MIPs were obtained to evaluate the vascular anatomy. RADIATION DOSE REDUCTION: This exam was performed according to the departmental dose-optimization program which includes automated exposure control, adjustment of the mA and/or kV according to patient size and/or use of iterative reconstruction technique. CONTRAST:  OMNIPAQUE IOHEXOL 350 MG/ML SOLN COMPARISON:  09/19/2021 FINDINGS: Cardiovascular: No filling defects in the pulmonary arteries to suggest pulmonary emboli. Heart is normal size. Aorta is normal caliber. Coronary artery and aortic calcifications. Mediastinum/Nodes: No mediastinal, hilar, or axillary adenopathy. Trachea and esophagus are unremarkable. Thyroid unremarkable. Lungs/Pleura: Bibasilar atelectasis.  No effusions. Upper Abdomen: No acute findings Musculoskeletal: Chest wall soft tissues are unremarkable. No acute bony abnormality. Review of the MIP images confirms the above findings. IMPRESSION: No evidence of pulmonary embolus. Coronary artery disease. Bibasilar atelectasis. Aortic Atherosclerosis (ICD10-I70.0). Electronically Signed   By: Charlett Nose M.D.   On: 01/25/2022 22:18   MR FOOT LEFT W WO CONTRAST  Result Date: 01/25/2022 CLINICAL DATA:  Foot swelling, diabetic, osteomyelitis suspected. EXAM: MRI OF THE LEFT FOREFOOT WITHOUT AND WITH CONTRAST TECHNIQUE: Multiplanar, multisequence MR imaging of the left was performed both before and after administration of intravenous contrast. CONTRAST:  37mL GADAVIST GADOBUTROL 1 MMOL/ML IV SOLN COMPARISON:  Radiographs dated January 25, 2022 FINDINGS: Bones/Joint/Cartilage Degenerative changes of the first metatarsophalangeal joint as well as talonavicular and cuneonavicular joints. Heterogeneous marrow signal of the cuboid and anterior process of the calcaneus. No  MR evidence of acute osteomyelitis. Ligaments Lisfranc and collateral ligaments are intact. Muscles and Tendons Increased intrasubstance signal of the plantar muscles suggesting diabetic myopathy/myositis. No intramuscular fluid collection or abscess. Soft tissues Soft tissue swelling prominent  about the dorsum of the midfoot. No enhancing fluid collection or abscess. IMPRESSION: 1. No MR evidence of acute osteomyelitis. 2. Soft tissue swelling about the dorsum of the midfoot without enhancing fluid collection or abscess. 3. Increased intrasubstance signal of the plantar muscles suggesting diabetic myopathy/myositis. 4. Degenerative changes of the first metatarsophalangeal as well as talonavicular and cuneonavicular joints. 5. Heterogeneous marrow signal of the navicular bone, cuboid and anterior process of the calcaneus, likely secondary to altered mechanics/Charcot arthropathy. Electronically Signed   By: Larose HiresImran  Ahmed D.O.   On: 01/25/2022 20:31   DG Chest Port 1 View  Result Date: 01/25/2022 CLINICAL DATA:  Questionable sepsis EXAM: PORTABLE CHEST 1 VIEW COMPARISON:  Chest x-ray 09/17/2021 FINDINGS: Heart is enlarged. Lungs are clear. No pleural effusion or pneumothorax. No acute fractures. IMPRESSION: Cardiomegaly. No acute pulmonary process. Electronically Signed   By: Darliss CheneyAmy  Guttmann M.D.   On: 01/25/2022 18:21   DG Foot Complete Left  Result Date: 01/25/2022 CLINICAL DATA:  Wound along the medial aspect of the left foot. EXAM: LEFT FOOT - COMPLETE 3+ VIEW COMPARISON:  01/24/2022. FINDINGS: No fracture.  No bone lesion. No bone resorption to suggest osteomyelitis. Degenerative spurring is noted along the midfoot. There is joint space narrowing as well as spurring between the calcaneus and cuboid. Moderate-sized plantar calcaneal spur. Mild forefoot soft tissue swelling.  No soft tissue air. IMPRESSION: 1. No fracture or acute finding.  No evidence of osteomyelitis. Electronically Signed   By: Amie Portlandavid   Ormond M.D.   On: 01/25/2022 18:19    Procedures Procedures    Medications Ordered in ED Medications  lactated ringers infusion (has no administration in time range)  lactated ringers bolus 1,000 mL (1,000 mLs Intravenous New Bag/Given 01/25/22 1816)  acetaminophen (TYLENOL) tablet 1,000 mg (1,000 mg Oral Given 01/25/22 1816)  gadobutrol (GADAVIST) 1 MMOL/ML injection 10 mL (10 mLs Intravenous Contrast Given 01/25/22 1940)  lactated ringers bolus 1,000 mL (1,000 mLs Intravenous New Bag/Given 01/25/22 2120)    And  lactated ringers bolus 1,000 mL (1,000 mLs Intravenous New Bag/Given 01/25/22 2121)  ceFEPIme (MAXIPIME) 2 g in sodium chloride 0.9 % 100 mL IVPB (2 g Intravenous New Bag/Given 01/25/22 2118)  metroNIDAZOLE (FLAGYL) IVPB 500 mg (500 mg Intravenous New Bag/Given 01/25/22 2121)  vancomycin (VANCOREADY) IVPB 2000 mg/400 mL (2,000 mg Intravenous New Bag/Given 01/25/22 2124)  iohexol (OMNIPAQUE) 350 MG/ML injection 100 mL (100 mLs Intravenous Contrast Given 01/25/22 2156)    ED Course/ Medical Decision Making/ A&P                           Medical Decision Making Amount and/or Complexity of Data Reviewed Labs: ordered. Radiology: ordered. ECG/medicine tests: ordered.  Risk OTC drugs. Prescription drug management.   This patient presents to the ED for concern of fever and generalized weakness, this involves an extensive number of treatment options, and is a complaint that carries with it a high risk of complications and morbidity.  The differential diagnosis includes sepsis, URI, infection, diabetic wound, osteomyelitis, pyelonephritis   Co morbidities that complicate the patient evaluation  CAD, HTN, HLD, OSA, arthritis, peripheral neuropathy, GERD, DM   Additional history obtained:  Additional history obtained from N/A External records from outside source obtained and reviewed including EMR   Lab Tests:  I Ordered, and personally interpreted labs.  The  pertinent results include: WBC is high-normal with neutrophilia present.  Initial lactate was elevated.  This normalized following IV  fluids.  Urinalysis does have pyuria and bacteriuria.  This is indicative of UTI in the setting of patient's recent dysuria.  Imaging Studies ordered:  I ordered imaging studies including chest x-ray, left foot x-ray, left foot MRI, CTA chest I independently visualized and interpreted imaging which showed no acute findings on x-rays.  MRI shows no evidence of osteomyelitis or abscess.  There is myositis present.  CTA chest showed no acute findings. I agree with the radiologist interpretation   Cardiac Monitoring: / EKG:  The patient was maintained on a cardiac monitor.  I personally viewed and interpreted the cardiac monitored which showed an underlying rhythm of: Sinus rhythm   Consultations Obtained:  I requested consultation with the podiatrist, Dr. Annamary Rummage,  and discussed lab and imaging findings as well as pertinent plan - they recommend: No surgical management indicated at this time.  Patient be treated with antibiotics and outpatient follow-up.   Problem List / ED Course / Critical interventions / Medication management  Patient is a 66 year old male presenting for onset of fevers last night.  In addition to his fevers, he has had generalized weakness and fatigue.  Approximately 3 days ago he did notice a new left foot ulcer.  He was seen by podiatrist yesterday for this.  He has not been started on any antibiotics.  On arrival in the ED, patient is febrile to 101.3 degrees orally.  Vital signs otherwise notable for tachypnea.  SPO2 is low-normal on room air.  His lungs are clear to auscultation.  On inspection of foot wound, there is no fluctuance.  There is not any significant streaking erythema.  Tenderness is absent but this is likely secondary to the patient's neuropathy.  He has very little feeling in his feet.  Patient was initially given IV fluid  for hydration and Tylenol for antipyresis.  Diagnostic work-up was initiated.  Initial lactic acid was elevated.  This raises further concern of sepsis and patient was given additional IV fluids and broad-spectrum antibiotics.  He underwent x-ray and MRI of his left foot.  On MRI of left foot, there is a myositis in the area of his ulcer.  There are no areas of abscess or osteomyelitis.  I discussed this with podiatrist on-call, Dr. Annamary Rummage who feels reassured by MRI results and photo of wound.  He does not feel that any surgical management is indicated.  He does recommend IV antibiotics.  Patient underwent work-up for further, alternative sources of his fever.  Low-normal SPO2 raises concern for pneumonia and/or PE.  He underwent CTA of chest which was negative for both.  He does have some pyuria and bacteria and does endorse some recent dysuria.  Given this, there may be a urinary source of his infection.  Following defervescent's of fever and IV fluid hydration, patient continued to feel weak.  His presentation is consistent with prior presentation in June for sepsis in the setting of a new onset diabetic foot ulcer.  Given his continued symptoms, patient to be admitted for continued antibiotics and observation. I ordered medication including IV fluids and broad-spectrum antibiotics for treatment of sepsis; Tylenol for antipyresis Reevaluation of the patient after these medicines showed that the patient improved I have reviewed the patients home medicines and have made adjustments as needed   Social Determinants of Health:  Lives independently, has access to outpatient care         Final Clinical Impression(s) / ED Diagnoses Final diagnoses:  Diabetic ulcer of left midfoot associated with  type 2 diabetes mellitus, with muscle involvement without evidence of necrosis (HCC)  Fever in adult    Rx / DC Orders ED Discharge Orders     None         Gloris Manchester, MD 01/25/22 2348

## 2022-01-26 DIAGNOSIS — E11621 Type 2 diabetes mellitus with foot ulcer: Secondary | ICD-10-CM | POA: Diagnosis present

## 2022-01-26 DIAGNOSIS — E1142 Type 2 diabetes mellitus with diabetic polyneuropathy: Secondary | ICD-10-CM | POA: Diagnosis present

## 2022-01-26 DIAGNOSIS — Z1152 Encounter for screening for COVID-19: Secondary | ICD-10-CM | POA: Diagnosis not present

## 2022-01-26 DIAGNOSIS — Z880 Allergy status to penicillin: Secondary | ICD-10-CM | POA: Diagnosis not present

## 2022-01-26 DIAGNOSIS — L089 Local infection of the skin and subcutaneous tissue, unspecified: Secondary | ICD-10-CM | POA: Diagnosis not present

## 2022-01-26 DIAGNOSIS — E1140 Type 2 diabetes mellitus with diabetic neuropathy, unspecified: Secondary | ICD-10-CM | POA: Diagnosis present

## 2022-01-26 DIAGNOSIS — E785 Hyperlipidemia, unspecified: Secondary | ICD-10-CM | POA: Diagnosis present

## 2022-01-26 DIAGNOSIS — I2510 Atherosclerotic heart disease of native coronary artery without angina pectoris: Secondary | ICD-10-CM | POA: Diagnosis present

## 2022-01-26 DIAGNOSIS — E11628 Type 2 diabetes mellitus with other skin complications: Secondary | ICD-10-CM | POA: Diagnosis present

## 2022-01-26 DIAGNOSIS — M1990 Unspecified osteoarthritis, unspecified site: Secondary | ICD-10-CM | POA: Diagnosis present

## 2022-01-26 DIAGNOSIS — Z791 Long term (current) use of non-steroidal anti-inflammatories (NSAID): Secondary | ICD-10-CM | POA: Diagnosis not present

## 2022-01-26 DIAGNOSIS — L97422 Non-pressure chronic ulcer of left heel and midfoot with fat layer exposed: Secondary | ICD-10-CM | POA: Diagnosis not present

## 2022-01-26 DIAGNOSIS — J9811 Atelectasis: Secondary | ICD-10-CM | POA: Diagnosis present

## 2022-01-26 DIAGNOSIS — Z955 Presence of coronary angioplasty implant and graft: Secondary | ICD-10-CM | POA: Diagnosis not present

## 2022-01-26 DIAGNOSIS — N390 Urinary tract infection, site not specified: Secondary | ICD-10-CM | POA: Diagnosis present

## 2022-01-26 DIAGNOSIS — L97425 Non-pressure chronic ulcer of left heel and midfoot with muscle involvement without evidence of necrosis: Secondary | ICD-10-CM | POA: Diagnosis present

## 2022-01-26 DIAGNOSIS — Z7984 Long term (current) use of oral hypoglycemic drugs: Secondary | ICD-10-CM | POA: Diagnosis not present

## 2022-01-26 DIAGNOSIS — G8929 Other chronic pain: Secondary | ICD-10-CM | POA: Diagnosis present

## 2022-01-26 DIAGNOSIS — Z7982 Long term (current) use of aspirin: Secondary | ICD-10-CM | POA: Diagnosis not present

## 2022-01-26 DIAGNOSIS — I10 Essential (primary) hypertension: Secondary | ICD-10-CM | POA: Diagnosis present

## 2022-01-26 DIAGNOSIS — G4733 Obstructive sleep apnea (adult) (pediatric): Secondary | ICD-10-CM | POA: Diagnosis present

## 2022-01-26 DIAGNOSIS — E872 Acidosis, unspecified: Secondary | ICD-10-CM | POA: Diagnosis present

## 2022-01-26 DIAGNOSIS — K219 Gastro-esophageal reflux disease without esophagitis: Secondary | ICD-10-CM | POA: Diagnosis present

## 2022-01-26 DIAGNOSIS — M109 Gout, unspecified: Secondary | ICD-10-CM | POA: Diagnosis present

## 2022-01-26 DIAGNOSIS — Z79899 Other long term (current) drug therapy: Secondary | ICD-10-CM | POA: Diagnosis not present

## 2022-01-26 DIAGNOSIS — Z23 Encounter for immunization: Secondary | ICD-10-CM | POA: Diagnosis present

## 2022-01-26 LAB — GLUCOSE, CAPILLARY
Glucose-Capillary: 119 mg/dL — ABNORMAL HIGH (ref 70–99)
Glucose-Capillary: 145 mg/dL — ABNORMAL HIGH (ref 70–99)
Glucose-Capillary: 170 mg/dL — ABNORMAL HIGH (ref 70–99)
Glucose-Capillary: 163 mg/dL — ABNORMAL HIGH (ref 70–99)

## 2022-01-26 LAB — HEMOGLOBIN A1C
Hgb A1c MFr Bld: 7.4 % — ABNORMAL HIGH (ref 4.8–5.6)
Mean Plasma Glucose: 165.68 mg/dL

## 2022-01-26 LAB — C-REACTIVE PROTEIN: CRP: 7.1 mg/dL — ABNORMAL HIGH (ref <1.0)

## 2022-01-26 MED ORDER — PANTOPRAZOLE SODIUM 40 MG PO TBEC
40.0000 mg | DELAYED_RELEASE_TABLET | Freq: Every day | ORAL | Status: DC
  Administered 2022-01-26 – 2022-01-27 (×2): 40 mg via ORAL
  Filled 2022-01-26 (×2): qty 1

## 2022-01-26 MED ORDER — ASPIRIN 325 MG PO TBEC
325.0000 mg | DELAYED_RELEASE_TABLET | Freq: Every day | ORAL | Status: DC
  Administered 2022-01-26 (×2): 325 mg via ORAL
  Filled 2022-01-26 (×3): qty 1

## 2022-01-26 MED ORDER — TRIAMTERENE-HCTZ 75-50 MG PO TABS
0.5000 | ORAL_TABLET | Freq: Every day | ORAL | Status: DC
  Administered 2022-01-26: 0.5 via ORAL
  Filled 2022-01-26 (×2): qty 0.5

## 2022-01-26 MED ORDER — ISOSORBIDE MONONITRATE ER 60 MG PO TB24
120.0000 mg | ORAL_TABLET | Freq: Every day | ORAL | Status: DC
  Administered 2022-01-26 – 2022-01-27 (×2): 120 mg via ORAL
  Filled 2022-01-26 (×2): qty 2

## 2022-01-26 MED ORDER — ACETAMINOPHEN 500 MG PO TABS: 1000.0000 mg | ORAL_TABLET | Freq: Three times a day (TID) | ORAL | Status: DC | PRN

## 2022-01-26 MED ORDER — ATORVASTATIN CALCIUM 40 MG PO TABS
80.0000 mg | ORAL_TABLET | Freq: Every day | ORAL | Status: DC
  Administered 2022-01-26 (×2): 80 mg via ORAL
  Filled 2022-01-26 (×2): qty 2

## 2022-01-26 MED ORDER — CELECOXIB 200 MG PO CAPS
200.0000 mg | ORAL_CAPSULE | Freq: Every day | ORAL | Status: DC
  Administered 2022-01-26 – 2022-01-27 (×2): 200 mg via ORAL
  Filled 2022-01-26 (×2): qty 1

## 2022-01-26 MED ORDER — SODIUM CHLORIDE 0.9 % IV SOLN
2.0000 g | Freq: Three times a day (TID) | INTRAVENOUS | Status: DC
  Administered 2022-01-26: 2 g via INTRAVENOUS
  Filled 2022-01-26: qty 12.5

## 2022-01-26 MED ORDER — INSULIN ASPART 100 UNIT/ML IJ SOLN
0.0000 [IU] | Freq: Three times a day (TID) | INTRAMUSCULAR | Status: DC
  Administered 2022-01-26: 1 [IU] via SUBCUTANEOUS
  Administered 2022-01-26: 2 [IU] via SUBCUTANEOUS
  Administered 2022-01-27: 1 [IU] via SUBCUTANEOUS
  Filled 2022-01-26: qty 0.09

## 2022-01-26 MED ORDER — PREGABALIN 75 MG PO CAPS
150.0000 mg | ORAL_CAPSULE | Freq: Two times a day (BID) | ORAL | Status: DC
  Administered 2022-01-26 – 2022-01-27 (×4): 150 mg via ORAL
  Filled 2022-01-26 (×4): qty 2

## 2022-01-26 MED ORDER — METOPROLOL SUCCINATE ER 50 MG PO TB24
100.0000 mg | ORAL_TABLET | Freq: Every day | ORAL | Status: DC
  Administered 2022-01-26 – 2022-01-27 (×2): 100 mg via ORAL
  Filled 2022-01-26 (×2): qty 2

## 2022-01-26 MED ORDER — COLESEVELAM HCL 625 MG PO TABS
1875.0000 mg | ORAL_TABLET | Freq: Every day | ORAL | Status: DC
  Administered 2022-01-26: 1875 mg via ORAL
  Filled 2022-01-26: qty 3

## 2022-01-26 MED ORDER — INSULIN ASPART 100 UNIT/ML IJ SOLN
0.0000 [IU] | Freq: Every day | INTRAMUSCULAR | Status: DC
  Filled 2022-01-26: qty 0.05

## 2022-01-26 MED ORDER — VANCOMYCIN HCL 1750 MG/350ML IV SOLN: 1750.0000 mg | INTRAVENOUS | Status: DC

## 2022-01-26 MED ORDER — ENOXAPARIN SODIUM 60 MG/0.6ML IJ SOSY
60.0000 mg | PREFILLED_SYRINGE | INTRAMUSCULAR | Status: DC
  Administered 2022-01-26 – 2022-01-27 (×2): 60 mg via SUBCUTANEOUS
  Filled 2022-01-26 (×2): qty 0.6

## 2022-01-26 MED ORDER — SODIUM CHLORIDE 0.9 % IV SOLN
1.0000 g | INTRAVENOUS | Status: DC
  Administered 2022-01-26: 1 g via INTRAVENOUS
  Filled 2022-01-26 (×2): qty 10

## 2022-01-26 MED ORDER — SENNOSIDES-DOCUSATE SODIUM 8.6-50 MG PO TABS
2.0000 | ORAL_TABLET | Freq: Every day | ORAL | Status: DC
  Administered 2022-01-26: 2 via ORAL
  Filled 2022-01-26: qty 2

## 2022-01-26 MED ORDER — METRONIDAZOLE 500 MG/100ML IV SOLN: 500.0000 mg | Freq: Two times a day (BID) | INTRAVENOUS | Status: DC

## 2022-01-26 NOTE — Progress Notes (Signed)
Pharmacy Antibiotic Note  [name] is a 66 y.o. male admitted on 01/25/2022 with cellulitis, UTI.  Pharmacy has been consulted for Cefepime and Vancomycin dosing.  Plan: Cefepime 2gm IV q8h Vancomycin 1750mg  IV q24h to target AUC 400-550.  Estimated AUC = 518. Check Vancomycin levels at steady state Monitor renal function and cx data   Weight: 124.9 kg (275 lb 4.8 oz)  Temp (24hrs), Avg:100 F (37.8 C), Min:98.7 F (37.1 C), Max:101.3 F (38.5 C)  Recent Labs  Lab 01/25/22 1757 01/25/22 2125  WBC 9.2  --   CREATININE 1.19  --   LATICACIDVEN 2.3* 1.6    Estimated Creatinine Clearance: 79.7 mL/min (by C-G formula based on SCr of 1.19 mg/dL).    Allergies  Allergen Reactions   Metformin Other (See Comments)   Penicillins Rash    Has patient had a PCN reaction causing immediate rash, facial/tongue/throat swelling, SOB or lightheadedness with hypotension: Yes Has patient had a PCN reaction causing severe rash involving mucus membranes or skin necrosis: Unk Has patient had a PCN reaction that required hospitalization: Unk Has patient had a PCN reaction occurring within the last 10 years: No If all of the above answers are "NO", then may proceed with Cephalosporin use.     Antimicrobials this admission: 10/14 Cefepime >>  10/14 Vancomycin >>  10/14 Metronidazole >>  Dose adjustments this admission:  Microbiology results: 10/14 BCx:  10/14 UCx:    Thank you for allowing pharmacy to be a part of this patient's care.  Netta Cedars PharmD 01/26/2022 1:57 AM

## 2022-01-26 NOTE — Consult Note (Signed)
PODIATRY CONSULTATION  NAME MASTER TOUCHET MRN [id number] DOB 05-Apr-1956 DOA 01/25/2022   Reason for consult:  Chief Complaint  Patient presents with   Wound Check   Fever    Attending/Consulting physician: Shawna Clamp MD  History of present illness: 66 y.o. male CAD, hypertension, hyperlipidemia, class III obesity, OSA on CPAP, gout, GERD, neuropathy, type II diabetes, chronic diabetic foot ulcer of right foot 2/2 charcot arthropathy that has now healed in.  He presents to the ED for concern for fever and has a left foot ulceration. He saw Dr. Jacqualyn Posey for this wound on Friday the 13th and the wound was debrided. Pt was placed on doxycycline. He developed a fever yesterday and therefore came into the ED for further eval. He states he has had burning with urination overnight and an episode of urinary incontinence. Denies N/V/F/C at this time.    Past Medical History:  Diagnosis Date   Arthritis    "ankles, knees" (01/29/2018)   CAD in native artery    cath report from 02/2017 indicates patient had a history of RCA stent >10 years prior to that. At time of that cath he was found to have EF 70%, 20-30% ostial LAD, 30-40% prox LAD, 30-40% Cx, 80-90% Cx beyond OM1, totally occluded RCA with left-to-right collaterals.   Diabetes mellitus type 2 in obese [facility])    GERD (gastroesophageal reflux disease)    Hyperlipidemia    Hypertension    Morbid obesity (HCC)    OSA on CPAP        Latest Ref Rng & Units 01/25/2022    5:57 PM 09/18/2021    5:06 AM 09/17/2021    2:00 PM  CBC  WBC 4.0 - 10.5 K/uL 9.2  7.1  14.6   Hemoglobin 13.0 - 17.0 g/dL 16.5  14.3  15.6   Hematocrit 39.0 - 52.0 % 50.5  43.6  47.5   Platelets 150 - 400 K/uL 151  121  118        Latest Ref Rng & Units 01/25/2022    5:57 PM 09/18/2021    5:06 AM 09/17/2021    2:00 PM  BMP  Glucose 70 - 99 mg/dL 128  113  161   BUN 8 - 23 mg/dL '18  22  24   ' Creatinine 0.61 - 1.24 mg/dL 1.19  0.99  1.08   Sodium 135 - 145 mmol/L  139  139  137   Potassium 3.5 - 5.1 mmol/L 3.6  3.7  4.5   Chloride 98 - 111 mmol/L 103  104  102   CO2 22 - 32 mmol/L '26  25  22   ' Calcium 8.9 - 10.3 mg/dL 9.0  8.9  9.5       Physical Exam: Lower Extremity Exam Vasc: R - PT 2/4 palpable, DP 2/4 palpable. Cap refill < 3 sec to digits  L - PT 2/4 palpable, DP 2/4 palpable. Cap refill <3 sec to digits  Derm: R - No open wound, minimal callus at the navicular tuberosity at site of prior wound.  L -  Wound: Located plantar medial arch sub navicular tuberosisty, measuring approximately 0.5 cm in diameter circular wound, probe to bone no, odor no, drainage none, no surrounding erythema or edema. No fluctuance noted.   MSK:  R -  Charcot changes with midfoot arch collapse.   L -  Charcot changed with midfoot arch collapse, less severe than right foot.  Neuro: R - Gross sensation diminished .  Gross motor function intact   L - Gross sensation diminished. Gross motor function intact    ASSESSMENT/PLAN OF CARE 66 y.o. male with PMHx significant for  AD, hypertension, hyperlipidemia, class III obesity, OSA on CPAP, gout, GERD, neuropathy, type II diabetes, prior diabetic foot ulcer of right foot 2/2 charcot arthropathy  with left plantar midfoot ulceration without signs of infection, possible early charcot arthropathy of the left midfoot. Also with likely UTI.   AF, VSS  WBC: 9.2 ESR/CRP: 7/p  XR Foot L: No evidence of osteomyelitis or soft tissue emphysema   MRI Foot L: No evidence of osteomyelitis or abscess. Diabetic myopathy/myositis. Heterogenous marrow signal of the navicular cuboid and calcaneus likely 2/2 charcot.   - Recommend continued wound care and monitoring. Currently no evidence of infection in the left foot. Pt also has possible UTI, would favor this to be source of his fever.  - ABX per primary for possible UTI, ok with PO abx on discharge for left foot wound prophylactic coverage.  - Anticoagulation: per primary - Wound  care: daily betadine applied to the wound, cover with gauze dressing or adhesive bandage - WB status: WBAT to the LLE, recommend CAM boot for ambulation  - Will continue to follow   Thank you for the consult.  Please contact me directly with any questions or concerns.           Everitt Amber, DPM [facility]    [address], [place] [address]                Office [phone]  Fax [phone]

## 2022-01-26 NOTE — Plan of Care (Signed)

## 2022-01-26 NOTE — Progress Notes (Addendum)
Subjective: Chief Complaint  Patient presents with   Diabetic Ulcer    Left foot ulcer started last     66 year old male presents the office today with a new concern of a wound to his left foot.  States he has been wearing some different shoes and then he noticed what appeared to be a blister and then the wound opened.  Denies any fevers or chills.  Objective: AAO x3, NAD DP/PT pulses palpable bilaterally, CRT less than 3 seconds After debridement the wound pictures below.  Prior to debridement there is hyperkeratotic tissue present on the periphery of the wound.  On the superior portion look that there was a blister about not able to get any drainage or fluid coming from this.  There is no fluctuation or crepitation.  After debridement the wound is granular and measures about 0.6 x 0.6 x 0.3 cm.  Prior to debridement it was smaller measuring 0.4 x 0.5 cm.  There is no probing to bone, amount or tunneling.  There is no fluctuation or crepitation.  There is no malodor. No pain with calf compression, swelling, warmth, erythema     Assessment: Left foot ulceration  Plan: -All treatment options discussed with the patient including all alternatives, risks, complications.  -Medically necessary wound debridement was performed today.  I sharply debrided the wound utilizing 312 with scalpel and to healthy, bleeding, granular tissue to remove nonviable tissue in order to promote wound healing.  Pre and post wound measurements are noted above.  Minimal bleeding.  Hemostasis achieved through manual compression.  Irrigated with saline and Silvadene was applied followed by dressing.  Recommended daily dressing changes and offloading.  Prescribed doxycycline.  Monitor for any signs or symptoms of worsening infection report to the emergency room should any occur.  Follow-up in 1 week as already scheduled. -Patient encouraged to call the office with any questions, concerns, change in symptoms.   Trula Slade DPM

## 2022-01-26 NOTE — H&P (Signed)
History and Physical    [name] GYF:[id number] DOB: 03/16/1956 DOA: 01/25/2022  PCP: Lajean Manes, MD  Patient coming from: Home  Chief Complaint: Left foot wound  HPI: [name] is a 66 y.o. male with medical history significant of CAD, hypertension, hyperlipidemia, class III obesity, OSA on CPAP, gout, GERD, neuropathy, type II diabetes, chronic diabetic foot ulcer of right foot.  He presents to the ED complaining of new left foot ulcer.  Febrile with temperature 101.3 F on arrival but remainder of vital signs stable.  Labs showing no leukocytosis, lactic acid 2.3> 1.6, blood cultures drawn, ESR normal, CRP pending, COVID and influenza PCR negative.  UA with negative nitrite, large amount of leukocytes, and microscopy showing 11-20 WBCs and rare bacteria.  Urine culture pending.  CTA chest showing bibasilar atelectasis and no acute finding.  MRI of left foot negative for osteomyelitis or abscess.  Showing findings suggestive of diabetic myopathy/myositis, degenerative changes, and Charcot arthropathy. ED physician discussed the case with podiatrist Dr. Loel Lofty, recommended no surgical management, treatment with antibiotics, and outpatient follow-up.  Patient was given Tylenol, vancomycin, cefepime, metronidazole, and 3 L LR boluses.  [facility] called to admit.  Patient states he noticed a blister on the bottom of his left foot 4 days ago which popped.  He saw his podiatrist 2 days ago and was prescribed doxycycline.  He took 1 dose of doxycycline and started having fevers which prompted him to come into the ED to be evaluated.  He denies any foot pain or drainage.  He is also endorsing dysuria and urinary frequency/urgency.  Denies abdominal pain, nausea, or vomiting.  Denies cough, shortness of breath, or chest pain.  Review of Systems:  Review of Systems  All other systems reviewed and are negative.   Past Medical History:  Diagnosis Date   Arthritis    "ankles, knees"  (01/29/2018)   CAD in native artery    cath report from 02/2017 indicates patient had a history of RCA stent >10 years prior to that. At time of that cath he was found to have EF 70%, 20-30% ostial LAD, 30-40% prox LAD, 30-40% Cx, 80-90% Cx beyond OM1, totally occluded RCA with left-to-right collaterals.   Diabetes mellitus type 2 in obese (HCC)    GERD (gastroesophageal reflux disease)    Hyperlipidemia    Hypertension    Morbid obesity ([place])    OSA on CPAP     Past Surgical History:  Procedure Laterality Date   CARDIAC CATHETERIZATION  2008   CORONARY ANGIOPLASTY WITH STENT PLACEMENT  1997   JOINT REPLACEMENT     KNEE ARTHROSCOPY Bilateral    "meniscus repairs"   TONSILLECTOMY     TOTAL KNEE ARTHROPLASTY Left 01/29/2018   TOTAL KNEE ARTHROPLASTY Left 01/29/2018   Procedure: LEFT TOTAL KNEE ARTHROPLASTY;  Surgeon: Leandrew Koyanagi, MD;  Location: [facility];  Service: Orthopedics;  Laterality: Left;   UVULOPALATOPHARYNGOPLASTY  ~ 1994     reports that he has never smoked. He has never been exposed to tobacco smoke. He has never used smokeless tobacco. He reports current alcohol use. He reports that he does not use drugs.  Allergies  Allergen Reactions   Metformin Other (See Comments)   Penicillins Rash    Has patient had a PCN reaction causing immediate rash, facial/tongue/throat swelling, SOB or lightheadedness with hypotension: Yes Has patient had a PCN reaction causing severe rash involving mucus membranes or skin necrosis: Unk Has patient had a PCN reaction  that required hospitalization: Unk Has patient had a PCN reaction occurring within the last 10 years: No If all of the above answers are "NO", then may proceed with Cephalosporin use.     Family History  Problem Relation Age of Onset   Heart attack Mother    Heart attack Father     Prior to Admission medications   Medication Sig Start Date End Date Taking? Authorizing Provider  acetaminophen (TYLENOL) 650 MG CR tablet  Take 1,300 mg by mouth at bedtime.   Yes [provider]  aspirin EC 325 MG tablet Take 325 mg by mouth at bedtime.   Yes [provider]  atorvastatin (LIPITOR) 80 MG tablet Take 80 mg by mouth at bedtime.  02/09/13  Yes [provider]  celecoxib (CELEBREX) 200 MG capsule TAKE 1 CAPSULE BY MOUTH EVERY DAY 11/05/21  Yes Trula Slade, DPM  Cholecalciferol (VITAMIN D3) 2000 units TABS Take 2,000 Units by mouth daily.   Yes [provider]  doxycycline (VIBRA-TABS) 100 MG tablet Take 1 tablet (100 mg total) by mouth 2 (two) times daily. 01/24/22  Yes Trula Slade, DPM  empagliflozin (JARDIANCE) 25 MG TABS tablet Take 1 tablet (25 mg total) by mouth daily. 01/09/22  Yes Belva Crome, MD  glimepiride (AMARYL) 2 MG tablet Take 2 mg by mouth daily. 12/03/20  Yes [provider]  Glucosamine-Chondroitin (COSAMIN DS PO) Take 2 capsules by mouth daily with breakfast.   Yes [provider]  isosorbide mononitrate (IMDUR) 120 MG 24 hr tablet TAKE 1 TABLET BY MOUTH EVERY DAY 01/08/22  Yes Belva Crome, MD  Menthol, Topical Analgesic, 4 % GEL Apply 1 application topically See admin instructions. Apply to both feet at bedtime for neuropathy   Yes [provider]  metoprolol succinate (TOPROL-XL) 100 MG 24 hr tablet TAKE 1 TABLET BY MOUTH DAILY. PLEASE CALL TO SCHEDULE AN OVERDUE APPOINTMENT WITH DR. Tamala Julian FOR REFILLS, [phone], THANK YOU. 3RD ATTEMPT. NO MORE REFILLS GIVEN 01/07/22  Yes Belva Crome, MD  Multiple Vitamin (MULTIVITAMIN) tablet Take 1 tablet by mouth daily.   Yes [provider]  omeprazole (PRILOSEC) 20 MG capsule Take 20 mg by mouth daily.  02/03/13  Yes [provider]  pregabalin (LYRICA) 150 MG capsule Take 1 capsule (150 mg total) by mouth 2 (two) times daily. 10/08/21  Yes Trula Slade, DPM  senna-docusate (SENOKOT S) 8.6-50 MG tablet Take 1 tablet by mouth at bedtime as needed. Patient  taking differently: Take 2 tablets by mouth at bedtime. 01/29/18  Yes Leandrew Koyanagi, MD  Tetrahydrozoline HCl (VISINE OP) Place 1 drop into both eyes daily as needed (for dry or irritated eyes).    Yes [provider]  triamterene-hydrochlorothiazide (MAXZIDE) 75-50 MG per tablet Take 0.5 tablets by mouth daily.  01/06/13  Yes [provider]  WELCHOL 625 MG tablet Take 1,875 mg by mouth at bedtime.  05/19/13  Yes [provider]  nitroGLYCERIN (NITROSTAT) 0.4 MG SL tablet Place 1 tablet (0.4 mg total) under the tongue every 5 (five) minutes x 3 doses as needed for chest pain. 12/07/20   Belva Crome, MD    Physical Exam: Vitals:   01/25/22 2330 01/26/22 0000 01/26/22 0030 01/26/22 0100  BP: (!) 153/83 138/78 (!) 140/73 (!) 143/77  Pulse: 82 87 85 81  Resp: _0 Temp:      TempSrc:      SpO2: 93% 90% 92%  90%  Weight:        Physical Exam Vitals reviewed.  Constitutional:      General: He is not in acute distress. HENT:     Head: Normocephalic and atraumatic.  Eyes:     Extraocular Movements: Extraocular movements intact.  Cardiovascular:     Rate and Rhythm: Normal rate and regular rhythm.     Pulses: Normal pulses.  Pulmonary:     Effort: Pulmonary effort is normal. No respiratory distress.     Breath sounds: Normal breath sounds. No wheezing or rales.  Abdominal:     General: Bowel sounds are normal. There is no distension.     Palpations: Abdomen is soft.     Tenderness: There is no abdominal tenderness.  Musculoskeletal:     Cervical back: Normal range of motion.     Comments: Ulcer noted on the plantar surface of the left foot without obvious drainage.  No tenderness to palpation.  Skin:    General: Skin is warm and dry.  Neurological:     General: No focal deficit present.     Mental Status: He is alert and oriented to person, place, and time.     Labs on Admission: I have personally reviewed following labs and imaging  studies  CBC: Recent Labs  Lab 01/25/22 1757  WBC 9.2  NEUTROABS 8.3*  HGB 16.5  HCT 50.5  MCV 89.4  PLT 092   Basic Metabolic Panel: Recent Labs  Lab 01/25/22 1757  NA 139  K 3.6  CL 103  CO2 26  GLUCOSE 128*  BUN 18  CREATININE 1.19  CALCIUM 9.0  MG 1.9   GFR: Estimated Creatinine Clearance: 79.7 mL/min (by C-G formula based on SCr of 1.19 mg/dL). Liver Function Tests: Recent Labs  Lab 01/25/22 1757  [name] 31  ALT 29  ALKPHOS 75  BILITOT 1.1  PROT 7.6  ALBUMIN 3.8   No results for input(s): "LIPASE", "AMYLASE" in the last 168 hours. No results for input(s): "AMMONIA" in the last 168 hours. Coagulation Profile: Recent Labs  Lab 01/25/22 1757  INR 1.1   Cardiac Enzymes: No results for input(s): "CKTOTAL", "CKMB", "CKMBINDEX", "TROPONINI" in the last 168 hours. BNP (last 3 results) No results for input(s): "PROBNP" in the last 8760 hours. HbA1C: No results for input(s): "HGBA1C" in the last 72 hours. CBG: No results for input(s): "GLUCAP" in the last 168 hours. Lipid Profile: No results for input(s): "CHOL", "HDL", "LDLCALC", "TRIG", "CHOLHDL", "LDLDIRECT" in the last 72 hours. Thyroid Function Tests: No results for input(s): "TSH", "T4TOTAL", "FREET4", "T3FREE", "THYROIDAB" in the last 72 hours. Anemia Panel: No results for input(s): "VITAMINB12", "FOLATE", "FERRITIN", "TIBC", "IRON", "RETICCTPCT" in the last 72 hours. Urine analysis:    Component Value Date/Time   COLORURINE YELLOW 01/25/2022 2038   APPEARANCEUR HAZY (A) 01/25/2022 2038   LABSPEC 1.030 01/25/2022 2038   PHURINE 5.0 01/25/2022 2038   GLUCOSEU >=500 (A) 01/25/2022 2038   HGBUR NEGATIVE 01/25/2022 2038   [place] 01/25/2022 2038   [place] 01/25/2022 2038   PROTEINUR 30 (A) 01/25/2022 2038   NITRITE NEGATIVE 01/25/2022 2038   LEUKOCYTESUR LARGE (A) 01/25/2022 2038    Radiological Exams on Admission: CT Angio Chest PE W and/or Wo Contrast  Result Date:  01/25/2022 CLINICAL DATA:  Pulmonary embolism (PE) suspected, high prob EXAM: CT ANGIOGRAPHY CHEST WITH CONTRAST TECHNIQUE: Multidetector CT imaging of the chest was performed using the standard protocol during bolus administration of intravenous contrast. Multiplanar CT image  reconstructions and MIPs were obtained to evaluate the vascular anatomy. RADIATION DOSE REDUCTION: This exam was performed according to the departmental dose-optimization program which includes automated exposure control, adjustment of the mA and/or kV according to patient size and/or use of iterative reconstruction technique. CONTRAST:  122m OMNIPAQUE IOHEXOL 350 MG/ML SOLN COMPARISON:  09/19/2021 FINDINGS: Cardiovascular: No filling defects in the pulmonary arteries to suggest pulmonary emboli. Heart is normal size. Aorta is normal caliber. Coronary artery and aortic calcifications. Mediastinum/Nodes: No mediastinal, hilar, or axillary adenopathy. Trachea and esophagus are unremarkable. Thyroid unremarkable. Lungs/Pleura: Bibasilar atelectasis.  No effusions. Upper Abdomen: No acute findings Musculoskeletal: Chest wall soft tissues are unremarkable. No acute bony abnormality. Review of the MIP images confirms the above findings. IMPRESSION: No evidence of pulmonary embolus. Coronary artery disease. Bibasilar atelectasis. Aortic Atherosclerosis (ICD10-I70.0). Electronically Signed   By: KRolm BaptiseM.D.   On: 01/25/2022 22:18   MR FOOT LEFT W WO CONTRAST  Result Date: 01/25/2022 CLINICAL DATA:  Foot swelling, diabetic, osteomyelitis suspected. EXAM: MRI OF THE LEFT FOREFOOT WITHOUT AND WITH CONTRAST TECHNIQUE: Multiplanar, multisequence MR imaging of the left was performed both before and after administration of intravenous contrast. CONTRAST:  146mGADAVIST GADOBUTROL 1 MMOL/ML IV SOLN COMPARISON:  Radiographs dated January 25, 2022 FINDINGS: Bones/Joint/Cartilage Degenerative changes of the first metatarsophalangeal joint as well as  talonavicular and cuneonavicular joints. Heterogeneous marrow signal of the cuboid and anterior process of the calcaneus. No MR evidence of acute osteomyelitis. Ligaments Lisfranc and collateral ligaments are intact. Muscles and Tendons Increased intrasubstance signal of the plantar muscles suggesting diabetic myopathy/myositis. No intramuscular fluid collection or abscess. Soft tissues Soft tissue swelling prominent about the dorsum of the midfoot. No enhancing fluid collection or abscess. IMPRESSION: 1. No MR evidence of acute osteomyelitis. 2. Soft tissue swelling about the dorsum of the midfoot without enhancing fluid collection or abscess. 3. Increased intrasubstance signal of the plantar muscles suggesting diabetic myopathy/myositis. 4. Degenerative changes of the first metatarsophalangeal as well as talonavicular and cuneonavicular joints. 5. Heterogeneous marrow signal of the navicular bone, cuboid and anterior process of the calcaneus, likely secondary to altered mechanics/Charcot arthropathy. Electronically Signed   By: ImKeane Police.O.   On: 01/25/2022 20:31   DG Chest Port 1 View  Result Date: 01/25/2022 CLINICAL DATA:  Questionable sepsis EXAM: PORTABLE CHEST 1 VIEW COMPARISON:  Chest x-ray 09/17/2021 FINDINGS: Heart is enlarged. Lungs are clear. No pleural effusion or pneumothorax. No acute fractures. IMPRESSION: Cardiomegaly. No acute pulmonary process. Electronically Signed   By: AmRonney Asters.D.   On: 01/25/2022 18:21   DG Foot Complete Left  Result Date: 01/25/2022 CLINICAL DATA:  Wound along the medial aspect of the left foot. EXAM: LEFT FOOT - COMPLETE 3+ VIEW COMPARISON:  01/24/2022. FINDINGS: No fracture.  No bone lesion. No bone resorption to suggest osteomyelitis. Degenerative spurring is noted along the midfoot. There is joint space narrowing as well as spurring between the calcaneus and cuboid. Moderate-sized plantar calcaneal spur. Mild forefoot soft tissue swelling.  No soft  tissue air. IMPRESSION: 1. No fracture or acute finding.  No evidence of osteomyelitis. Electronically Signed   By: DaLajean Manes.D.   On: 01/25/2022 18:19    EKG: Independently reviewed.  Sinus rhythm, no significant change since prior tracing.  Assessment and Plan  Diabetic left foot infection MRI negative for osteomyelitis or abscess.  Showing findings suggestive of diabetic myopathy/myositis, degenerative changes, and Charcot arthropathy. ED physician discussed the case with podiatrist Dr. StLoel Lofty  recommended no surgical management, treatment with antibiotics, and outpatient follow-up.  Patient febrile on arrival to the ED but does not meet any other SIRS criteria.  Mild lactic acidosis resolved after fluid boluses.  Vital signs currently stable. -Continue vancomycin, cefepime, and metronidazole -Tylenol as needed for fevers -Blood cultures pending  Possible UTI Patient is endorsing urinary symptoms. UA with negative nitrite, large amount of leukocytes, and microscopy showing 11-20 WBCs and rare bacteria.  -Already on antibiotics -Urine culture pending  CAD Stable.  EKG without acute ischemic changes and patient is not endorsing chest pain. -Continue aspirin, metoprolol, Imdur, Lipitor  Hypertension Stable. -Continue Imdur, metoprolol, triamterene-hydrochlorothiazide  Hyperlipidemia -Continue Lipitor  OSA -Continue nightly CPAP  GERD -Continue PPI  Type 2 diabetes -Check A1c -Sensitive sliding scale insulin ACHS -Hold oral hypoglycemic agents at this time  Chronic pain -Continue Celebrex, Lyrica  DVT prophylaxis: Lovenox Code Status: Full Code (discussed with the patient) Family Communication: No family available at this time. Level of care: Med-Surg Admission status: It is my clinical opinion that referral for OBSERVATION is reasonable and necessary in this patient based on the above information provided. The aforementioned taken together are felt to place  the patient at high risk for further clinical deterioration. However, it is anticipated that the patient may be medically stable for discharge from the hospital within 24 to 48 hours.   Shela Leff MD [facility]  If 7PM-7AM, please contact night-coverage [email or web address]  01/26/2022, 1:20 AM

## 2022-01-26 NOTE — Care Plan (Signed)
This 66 years old male with PMH significant for CAD, hypertension, hyperlipidemia, class III obesity, OSA on CPAP, gout, GERD, neuropathy, type II diabetes, chronic diabetic foot ulcer of right foot.  He presents to the ED complaining of new left foot ulcer.  Patient was febrile on arrival.  Influenza and COVID-negative.  UA consistent with UTI.  Patient does have open wound on the bottom of the left foot.  Patient was empirically started on antibiotics.  Patient was seen by podiatry who does not think that patient has foot infection.  Fever could be secondary to UTI.  MRI negative for osteomyelitis.  Antibiotic de-escalated to ceftriaxone.  Patient was seen and examined at bedside,  reports feeling better.

## 2022-01-26 NOTE — Progress Notes (Signed)
Pt has declined use of hospital provided CPAP.  Pt states that last time he could not tolerate out machines.  Pt advised to notify RT if he changes his mind.

## 2022-01-26 NOTE — Progress Notes (Signed)
Orthopedic Tech Progress Note Patient Details:  [name] [birth year] [id number]  Ortho Devices Type of Ortho Device: CAM walker Ortho Device/Splint Location: left Ortho Device/Splint Interventions: Application   Post Interventions Patient Tolerated: Well Instructions Provided: Care of device, Adjustment of device  [name] 01/26/2022, 10:41 AM

## 2022-01-27 DIAGNOSIS — E11628 Type 2 diabetes mellitus with other skin complications: Secondary | ICD-10-CM | POA: Diagnosis not present

## 2022-01-27 DIAGNOSIS — Z23 Encounter for immunization: Secondary | ICD-10-CM | POA: Diagnosis present

## 2022-01-27 DIAGNOSIS — L089 Local infection of the skin and subcutaneous tissue, unspecified: Secondary | ICD-10-CM | POA: Diagnosis not present

## 2022-01-27 LAB — BASIC METABOLIC PANEL
Anion gap: 8 (ref 5–15)
BUN: 16 mg/dL (ref 8–23)
CO2: 26 mmol/L (ref 22–32)
Calcium: 8.5 mg/dL — ABNORMAL LOW (ref 8.9–10.3)
Chloride: 104 mmol/L (ref 98–111)
Creatinine, Ser: 0.95 mg/dL (ref 0.61–1.24)
GFR, Estimated: >60 mL/min (ref >60)
Glucose, Bld: 122 mg/dL — ABNORMAL HIGH (ref 70–99)
Potassium: 3.2 mmol/L — ABNORMAL LOW (ref 3.5–5.1)
Sodium: 138 mmol/L (ref 135–145)

## 2022-01-27 LAB — URINE CULTURE: Culture: >=100000 — AB

## 2022-01-27 LAB — MAGNESIUM: Magnesium: 2 mg/dL (ref 1.7–2.4)

## 2022-01-27 LAB — CBC
HCT: 47.4 % (ref 39.0–52.0)
Hemoglobin: 15.1 g/dL (ref 13.0–17.0)
MCH: 28.8 pg (ref 26.0–34.0)
MCHC: 31.9 g/dL (ref 30.0–36.0)
MCV: 90.3 fL (ref 80.0–100.0)
Platelets: 115 10*3/uL — ABNORMAL LOW (ref 150–400)
RBC: 5.25 MIL/uL (ref 4.22–5.81)
RDW: 15.2 % (ref 11.5–15.5)
WBC: 5.8 10*3/uL (ref 4.0–10.5)
nRBC: 0 % (ref 0.0–0.2)

## 2022-01-27 LAB — GLUCOSE, CAPILLARY
Glucose-Capillary: 115 mg/dL — ABNORMAL HIGH (ref 70–99)
Glucose-Capillary: 135 mg/dL — ABNORMAL HIGH (ref 70–99)

## 2022-01-27 LAB — PHOSPHORUS: Phosphorus: 3.4 mg/dL (ref 2.5–4.6)

## 2022-01-27 MED ORDER — CEPHALEXIN 500 MG PO CAPS: 500.0000 mg | ORAL_CAPSULE | Freq: Four times a day (QID) | ORAL | 0 refills | Status: AC

## 2022-01-27 MED ORDER — POTASSIUM CHLORIDE 20 MEQ PO PACK
40.0000 meq | PACK | Freq: Once | ORAL | Status: AC
  Administered 2022-01-27: 40 meq via ORAL
  Filled 2022-01-27: qty 2

## 2022-01-27 MED ORDER — DOXYCYCLINE HYCLATE 100 MG PO TABS: 100.0000 mg | ORAL_TABLET | Freq: Two times a day (BID) | ORAL | 0 refills | Status: DC

## 2022-01-27 MED ORDER — INFLUENZA VAC A&B SA ADJ QUAD 0.5 ML IM PRSY
0.5000 mL | PREFILLED_SYRINGE | INTRAMUSCULAR | Status: AC
  Administered 2022-01-27: 0.5 mL via INTRAMUSCULAR
  Filled 2022-01-27: qty 0.5

## 2022-01-27 MED ORDER — TRIAMTERENE-HCTZ 37.5-25 MG PO TABS
1.0000 | ORAL_TABLET | Freq: Every day | ORAL | Status: DC
  Administered 2022-01-27: 1 via ORAL
  Filled 2022-01-27: qty 1

## 2022-01-27 NOTE — Plan of Care (Signed)

## 2022-01-27 NOTE — Plan of Care (Signed)
Patient is discharging home in a stable conditions. He is denying needs and/or concerns at this time. AVS discussed and he acknowledged understanding. Flu Vac administered per patients request, information reviewed with patient.  IV line removed and patient awaits transport home

## 2022-01-27 NOTE — Discharge Summary (Signed)
Physician Discharge Summary  IVO MOGA TGG:[id number] DOB: [birth year] DOA: 01/25/2022  PCP: Merlene Laughter, MD  Admit date: 01/25/2022  Discharge date: 01/27/2022  Admitted From: Home  Disposition:  Home  Recommendations for Outpatient Follow-up:  Follow up with PCP in 1-2 weeks. Please obtain BMP/CBC in one week, Advised to take keflex for 5 days for UTI and for foot cellulitis. MRI negative for osteomyelitis.   Follow-up urine cultures.  Home Health: None Equipment/Devices: None  Discharge Condition: Stable CODE STATUS: Full code Diet recommendation: Carb modified diet  Brief Summary/ Hospital Course: This 66 years old male with PMH significant for CAD, hypertension, hyperlipidemia, class III obesity, OSA on CPAP, gout, GERD, neuropathy, type II diabetes, chronic diabetic foot ulcer of right foot.  He presents to the ED complaining of new left foot ulcer.  Patient was febrile on arrival.  Influenza and COVID-negative. UA consistent with UTI.  Patient does have open wound on the bottom of the left foot.  Patient was empirically started on antibiotics.  Patient was seen by podiatry who does not think that patient has foot infection.  Fever could be secondary to UTI.  MRI negative for osteomyelitis.  Antibiotic de-escalated to ceftriaxone.  Patient feels much improved.  Denies any further urinary symptoms.  He wants to be discharged.  Patient is being discharged home on Keflex for 5 days for UTI , advised to follow-up with PCP in 1 week.  Discharge Diagnoses:  Principal Problem:   Diabetic foot infection (HCC) Active Problems:   CAD (coronary artery disease), native coronary artery   Essential hypertension   Hyperlipidemia   Gastroesophageal reflux disease   DM (diabetes mellitus) (HCC)   UTI (urinary tract infection)  Discharge Instructions  Discharge Instructions     Call MD for:  persistant dizziness or light-headedness   Complete by: As directed    Call MD for:   persistant nausea and vomiting   Complete by: As directed    Diet - low sodium heart healthy   Complete by: As directed    Diet Carb Modified   Complete by: As directed    Discharge instructions   Complete by: As directed    Advised to follow-up with primary care physician in 1 week. Advised to take doxycycline for 10 days for UTI and for foot cellulitis. MRI negative for osteomyelitis.  Urine culture no growth so far   Discharge wound care:   Complete by: As directed    Follow-up PCP.   Increase activity slowly   Complete by: As directed       Allergies as of 01/27/2022       Reactions   Metformin Other (See Comments)   Penicillins Rash   Has patient had a PCN reaction causing immediate rash, facial/tongue/throat swelling, SOB or lightheadedness with hypotension: Yes Has patient had a PCN reaction causing severe rash involving mucus membranes or skin necrosis: Unk Has patient had a PCN reaction that required hospitalization: Unk Has patient had a PCN reaction occurring within the last 10 years: No If all of the above answers are "NO", then may proceed with Cephalosporin use.        Medication List     STOP taking these medications    doxycycline 100 MG tablet Commonly known as: VIBRA-TABS       TAKE these medications    acetaminophen 650 MG CR tablet Commonly known as: TYLENOL Take 1,300 mg by mouth at bedtime.   aspirin EC 325 MG tablet Take  325 mg by mouth at bedtime.   atorvastatin 80 MG tablet Commonly known as: LIPITOR Take 80 mg by mouth at bedtime.   celecoxib 200 MG capsule Commonly known as: CELEBREX TAKE 1 CAPSULE BY MOUTH EVERY DAY   cephALEXin 500 MG capsule Commonly known as: KEFLEX Take 1 capsule (500 mg total) by mouth 4 (four) times daily for 5 days.   COSAMIN DS PO Take 2 capsules by mouth daily with breakfast.   empagliflozin 25 MG Tabs tablet Commonly known as: JARDIANCE Take 1 tablet (25 mg total) by mouth daily.    glimepiride 2 MG tablet Commonly known as: AMARYL Take 2 mg by mouth daily.   isosorbide mononitrate 120 MG 24 hr tablet Commonly known as: IMDUR TAKE 1 TABLET BY MOUTH EVERY DAY   Menthol (Topical Analgesic) 4 % Gel Apply 1 application topically See admin instructions. Apply to both feet at bedtime for neuropathy   metoprolol succinate 100 MG 24 hr tablet Commonly known as: TOPROL-XL TAKE 1 TABLET BY MOUTH DAILY. PLEASE CALL TO SCHEDULE AN OVERDUE APPOINTMENT WITH DR. Katrinka Blazing FOR REFILLS, [phone], THANK YOU. 3RD ATTEMPT. NO MORE REFILLS GIVEN   multivitamin tablet Take 1 tablet by mouth daily.   nitroGLYCERIN 0.4 MG SL tablet Commonly known as: Nitrostat Place 1 tablet (0.4 mg total) under the tongue every 5 (five) minutes x 3 doses as needed for chest pain.   omeprazole 20 MG capsule Commonly known as: PRILOSEC Take 20 mg by mouth daily.   pregabalin 150 MG capsule Commonly known as: LYRICA Take 1 capsule (150 mg total) by mouth 2 (two) times daily.   senna-docusate 8.6-50 MG tablet Commonly known as: Senokot S Take 1 tablet by mouth at bedtime as needed. What changed:  how much to take when to take this   triamterene-hydrochlorothiazide 75-50 MG tablet Commonly known as: MAXZIDE Take 0.5 tablets by mouth daily.   VISINE OP Place 1 drop into both eyes daily as needed (for dry or irritated eyes).   Vitamin D3 50 MCG (2000 UT) Tabs Take 2,000 Units by mouth daily.   Welchol 625 MG tablet Generic drug: colesevelam Take 1,875 mg by mouth at bedtime.               Discharge Care Instructions  (From admission, onward)           Start     Ordered   01/27/22 0000  Discharge wound care:       Comments: Follow-up PCP.   01/27/22 1050            Follow-up Information     Stoneking, Hal, MD Follow up in 1 week(s).   Specialty: Internal Medicine Contact information: [address] [place] [address] [phone]                 Allergies  Allergen Reactions   Metformin Other (See Comments)   Penicillins Rash    Has patient had a PCN reaction causing immediate rash, facial/tongue/throat swelling, SOB or lightheadedness with hypotension: Yes Has patient had a PCN reaction causing severe rash involving mucus membranes or skin necrosis: Unk Has patient had a PCN reaction that required hospitalization: Unk Has patient had a PCN reaction occurring within the last 10 years: No If all of the above answers are "NO", then may proceed with Cephalosporin use.     Consultations: Podiatry   Procedures/Studies: CT Angio Chest PE W and/or Wo Contrast  Result Date: 01/25/2022 CLINICAL DATA:  Pulmonary embolism (PE) suspected, high prob EXAM: CT ANGIOGRAPHY CHEST WITH CONTRAST TECHNIQUE: Multidetector CT imaging of the chest was performed using the standard protocol during bolus administration of intravenous contrast. Multiplanar CT image reconstructions and MIPs were obtained to evaluate the vascular anatomy. RADIATION DOSE REDUCTION: This exam was performed according to the departmental dose-optimization program which includes automated exposure control, adjustment of the mA and/or kV according to patient size and/or use of iterative reconstruction technique. CONTRAST:  144mL OMNIPAQUE IOHEXOL 350 MG/ML SOLN COMPARISON:  09/19/2021 FINDINGS: Cardiovascular: No filling defects in the pulmonary arteries to suggest pulmonary emboli. Heart is normal size. Aorta is normal caliber. Coronary artery and aortic calcifications. Mediastinum/Nodes: No mediastinal, hilar, or axillary adenopathy. Trachea and esophagus are unremarkable. Thyroid unremarkable. Lungs/Pleura: Bibasilar atelectasis.  No effusions. Upper Abdomen: No acute findings Musculoskeletal: Chest wall soft tissues are unremarkable. No acute bony abnormality. Review of the MIP images confirms the above findings. IMPRESSION: No evidence of pulmonary embolus. Coronary  artery disease. Bibasilar atelectasis. Aortic Atherosclerosis (ICD10-I70.0). Electronically Signed   By: Rolm Baptise M.D.   On: 01/25/2022 22:18   MR FOOT LEFT W WO CONTRAST  Result Date: 01/25/2022 CLINICAL DATA:  Foot swelling, diabetic, osteomyelitis suspected. EXAM: MRI OF THE LEFT FOREFOOT WITHOUT AND WITH CONTRAST TECHNIQUE: Multiplanar, multisequence MR imaging of the left was performed both before and after administration of intravenous contrast. CONTRAST:  75mL GADAVIST GADOBUTROL 1 MMOL/ML IV SOLN COMPARISON:  Radiographs dated January 25, 2022 FINDINGS: Bones/Joint/Cartilage Degenerative changes of the first metatarsophalangeal joint as well as talonavicular and cuneonavicular joints. Heterogeneous marrow signal of the cuboid and anterior process of the calcaneus. No MR evidence of acute osteomyelitis. Ligaments Lisfranc and collateral ligaments are intact. Muscles and Tendons Increased intrasubstance signal of the plantar muscles suggesting diabetic myopathy/myositis. No intramuscular fluid collection or abscess. Soft tissues Soft tissue swelling prominent about the dorsum of the midfoot. No enhancing fluid collection or abscess. IMPRESSION: 1. No MR evidence of acute osteomyelitis. 2. Soft tissue swelling about the dorsum of the midfoot without enhancing fluid collection or abscess. 3. Increased intrasubstance signal of the plantar muscles suggesting diabetic myopathy/myositis. 4. Degenerative changes of the first metatarsophalangeal as well as talonavicular and cuneonavicular joints. 5. Heterogeneous marrow signal of the navicular bone, cuboid and anterior process of the calcaneus, likely secondary to altered mechanics/Charcot arthropathy. Electronically Signed   By: Keane Police D.O.   On: 01/25/2022 20:31   DG Chest Port 1 View  Result Date: 01/25/2022 CLINICAL DATA:  Questionable sepsis EXAM: PORTABLE CHEST 1 VIEW COMPARISON:  Chest x-ray 09/17/2021 FINDINGS: Heart is enlarged. Lungs are  clear. No pleural effusion or pneumothorax. No acute fractures. IMPRESSION: Cardiomegaly. No acute pulmonary process. Electronically Signed   By: Ronney Asters M.D.   On: 01/25/2022 18:21   DG Foot Complete Left  Result Date: 01/25/2022 CLINICAL DATA:  Wound along the medial aspect of the left foot. EXAM: LEFT FOOT - COMPLETE 3+ VIEW COMPARISON:  01/24/2022. FINDINGS: No fracture.  No bone lesion. No bone resorption to suggest osteomyelitis. Degenerative spurring is noted along the midfoot. There is joint space narrowing as well as spurring between the calcaneus and cuboid. Moderate-sized plantar calcaneal spur. Mild forefoot soft tissue swelling.  No soft tissue air. IMPRESSION: 1. No fracture or acute finding.  No evidence of osteomyelitis. Electronically Signed   By: Lajean Manes M.D.   On: 01/25/2022 18:19     Subjective: Patient was seen and examined at bedside.  Overnight events noted.  Patient reports feeling better,  he wants to be discharged.  Patient being discharged home on oral antibiotics.  Discharge Exam: Vitals:   01/26/22 2104 01/27/22 0440  BP: (!) 142/70 125/67  Pulse: 64 62  Resp: 20 18  Temp: 98.1 F (36.7 C) 97.8 F (36.6 C)  SpO2: 97% 95%   Vitals:   01/26/22 1236 01/26/22 1710 01/26/22 2104 01/27/22 0440  BP: 110/74 124/64 (!) 142/70 125/67  Pulse: 63 66 64 62  Resp: 18 18 20 18   Temp: 97.8 F (36.6 C) 98 F (36.7 C) 98.1 F (36.7 C) 97.8 F (36.6 C)  TempSrc: Oral Oral Oral Oral  SpO2: 96% 93% 97% 95%  Weight:      Height:        General: Pt is alert, awake, not in acute distress Cardiovascular: RRR, S1/S2 +, no rubs, no gallops Respiratory: CTA bilaterally, no wheezing, no rhonchi Abdominal: Soft, NT, ND, bowel sounds + Extremities: no edema, no cyanosis    The results of significant diagnostics from this hospitalization (including imaging, microbiology, ancillary and laboratory) are listed below for reference.     Microbiology: Recent  Results (from the past 240 hour(s))  Blood Culture (routine x 2)     Status: None (Preliminary result)   Collection Time: 01/25/22  5:57 PM   Specimen: BLOOD  Result Value Ref Range Status   Specimen Description   Final    BLOOD LEFT ANTECUBITAL Performed at [facility], [address], [place], [place]    Special Requests   Final    BOTTLES DRAWN AEROBIC AND ANAEROBIC Blood Culture adequate volume Performed at [facility], [address] [place], [place]    Culture   Final    NO GROWTH 2 DAYS Performed at [facility], [address], [place], [place]    Report Status PENDING  Incomplete  Resp Panel by RT-PCR (Flu A&B, Covid) Anterior Nasal Swab     Status: None   Collection Time: 01/25/22  6:23 PM   Specimen: Anterior Nasal Swab  Result Value Ref Range Status   SARS Coronavirus 2 by RT PCR NEGATIVE NEGATIVE Final    Comment: (NOTE) SARS-CoV-2 target nucleic acids are NOT DETECTED.  The SARS-CoV-2 RNA is generally detectable in upper respiratory specimens during the acute phase of infection. The lowest concentration of SARS-CoV-2 viral copies this assay can detect is 138 copies/mL. A negative result does not preclude SARS-Cov-2 infection and should not be used as the sole basis for treatment or other patient management decisions. A negative result may occur with  improper specimen collection/handling, submission of specimen other than nasopharyngeal swab, presence of viral mutation(s) within the areas targeted by this assay, and inadequate number of viral copies(<138 copies/mL). A negative result must be combined with clinical observations, patient history, and epidemiological information. The expected result is Negative.  Fact Sheet for Patients:  01/27/22  Fact Sheet for Healthcare Providers:  BloggerCourse.com  This test is no t yet  approved or cleared by the [place] FDA and  has been authorized for detection and/or diagnosis of SARS-CoV-2 by FDA under an Emergency Use Authorization (EUA). This EUA will remain  in effect (meaning this test can be used) for the duration of the COVID-19 declaration under Section 564(b)(1) of the Act, 21 U.S.C.section 360bbb-3(b)(1), unless the authorization is terminated  or revoked sooner.       Influenza A by PCR NEGATIVE NEGATIVE Final  Influenza B by PCR NEGATIVE NEGATIVE Final    Comment: (NOTE) The Xpert Xpress SARS-CoV-2/FLU/RSV plus assay is intended as an aid in the diagnosis of influenza from Nasopharyngeal swab specimens and should not be used as a sole basis for treatment. Nasal washings and aspirates are unacceptable for Xpert Xpress SARS-CoV-2/FLU/RSV testing.  Fact Sheet for Patients: BloggerCourse.comhttps://www.fda.gov/media/152166/download  Fact Sheet for Healthcare Providers: SeriousBroker.ithttps://www.fda.gov/media/152162/download  This test is not yet approved or cleared by the [place] FDA and has been authorized for detection and/or diagnosis of SARS-CoV-2 by FDA under an Emergency Use Authorization (EUA). This EUA will remain in effect (meaning this test can be used) for the duration of the COVID-19 declaration under Section 564(b)(1) of the Act, 21 U.S.C. section 360bbb-3(b)(1), unless the authorization is terminated or revoked.  Performed at [facility], [address] [place], [place] [address]   Blood Culture (routine x 2)     Status: None (Preliminary result)   Collection Time: 01/25/22  6:30 PM   Specimen: Right Antecubital; Blood  Result Value Ref Range Status   Specimen Description   Final    RIGHT ANTECUBITAL BLOOD Performed at [facility], [address] [place], [place] [address]    Special Requests   Final    BOTTLES DRAWN AEROBIC AND ANAEROBIC Blood Culture adequate volume Performed at [facility], [address] [place], [place] [address]    Culture   Final    NO GROWTH 2 DAYS Performed at [facility], [address] [place], [place] [address]    Report Status PENDING  Incomplete  Urine Culture     Status: Abnormal   Collection Time: 01/25/22  8:38 PM   Specimen: Urine, Clean Catch  Result Value Ref Range Status   Specimen Description   Final    URINE, CLEAN CATCH Performed at [facility], [address] [place], [place] [address]    Special Requests   Final    NONE Performed at [facility], [address] [place], [place] [address]    Culture (A)  Final    >=100,000 COLONIES/mL LACTOBACILLUS SPECIES 30,000 COLONIES/mL GROUP B STREP(S.AGALACTIAE)ISOLATED TESTING AGAINST S. AGALACTIAE NOT ROUTINELY PERFORMED DUE TO PREDICTABILITY OF AMP/PEN/VAN SUSCEPTIBILITY. Standardized susceptibility testing for this organism is not available. LACTOBACILLUS SPECIES    Report Status 01/27/2022 FINAL  Final     Labs: BNP (last 3 results) No results for input(s): "BNP" in the last 8760 hours. Basic Metabolic Panel: Recent Labs  Lab 01/25/22 1757 01/27/22 0435  NA 139 138  K 3.6 3.2*  CL 103 104  CO2 26 26  GLUCOSE 128* 122*  BUN 18 16  CREATININE 1.19 0.95  CALCIUM 9.0 8.5*  MG 1.9 2.0  PHOS  --  3.4   Liver Function Tests: Recent Labs  Lab 01/25/22 1757  [name] 31  ALT 29  ALKPHOS 75  BILITOT 1.1  PROT 7.6  ALBUMIN 3.8   No results for input(s): "LIPASE", "AMYLASE" in the last 168 hours. No results for input(s): "AMMONIA" in the last 168 hours. CBC: Recent Labs  Lab 01/25/22 1757 01/27/22 0435  WBC 9.2 5.8  NEUTROABS 8.3*  --   HGB 16.5 15.1  HCT 50.5 47.4  MCV 89.4 90.3  PLT 151 115*   Cardiac Enzymes: No results for input(s): "CKTOTAL", "CKMB", "CKMBINDEX", "TROPONINI" in the last 168 hours. BNP: Invalid input(s): "POCBNP" CBG: Recent Labs  Lab 01/26/22 0913 01/26/22 1214  01/26/22 1641 01/26/22 2107  01/27/22 0719  GLUCAP 119* 145* 170* 163* 135*   D-Dimer No results for input(s): "DDIMER" in the last 72 hours. Hgb A1c Recent Labs    01/26/22 0248  HGBA1C 7.4*   Lipid Profile No results for input(s): "CHOL", "HDL", "LDLCALC", "TRIG", "CHOLHDL", "LDLDIRECT" in the last 72 hours. Thyroid function studies No results for input(s): "TSH", "T4TOTAL", "T3FREE", "THYROIDAB" in the last 72 hours.  Invalid input(s): "FREET3" Anemia work up No results for input(s): "VITAMINB12", "FOLATE", "FERRITIN", "TIBC", "IRON", "RETICCTPCT" in the last 72 hours. Urinalysis    Component Value Date/Time   COLORURINE YELLOW 01/25/2022 2038   APPEARANCEUR HAZY (A) 01/25/2022 2038   LABSPEC 1.030 01/25/2022 2038   PHURINE 5.0 01/25/2022 2038   GLUCOSEU >=500 (A) 01/25/2022 2038   HGBUR NEGATIVE 01/25/2022 2038   BILIRUBINUR NEGATIVE 01/25/2022 2038   KETONESUR NEGATIVE 01/25/2022 2038   PROTEINUR 30 (A) 01/25/2022 2038   NITRITE NEGATIVE 01/25/2022 2038   LEUKOCYTESUR LARGE (A) 01/25/2022 2038   Sepsis Labs Recent Labs  Lab 01/25/22 1757 01/27/22 0435  WBC 9.2 5.8   Microbiology Recent Results (from the past 240 hour(s))  Blood Culture (routine x 2)     Status: None (Preliminary result)   Collection Time: 01/25/22  5:57 PM   Specimen: BLOOD  Result Value Ref Range Status   Specimen Description   Final    BLOOD LEFT ANTECUBITAL Performed at [facility], [address], [place], [place] [address]    Special Requests   Final    BOTTLES DRAWN AEROBIC AND ANAEROBIC Blood Culture adequate volume Performed at [facility], [address], [place], [place] [address]    Culture   Final    NO GROWTH 2 DAYS Performed at [facility], [address], [place], [place] [address]    Report Status PENDING  Incomplete  Resp Panel by RT-PCR (Flu A&B, Covid) Anterior Nasal Swab     Status: None   Collection Time: 01/25/22  6:23 PM   Specimen: Anterior  Nasal Swab  Result Value Ref Range Status   SARS Coronavirus 2 by RT PCR NEGATIVE NEGATIVE Final    Comment: (NOTE) SARS-CoV-2 target nucleic acids are NOT DETECTED.  The SARS-CoV-2 RNA is generally detectable in upper respiratory specimens during the acute phase of infection. The lowest concentration of SARS-CoV-2 viral copies this assay can detect is 138 copies/mL. A negative result does not preclude SARS-Cov-2 infection and should not be used as the sole basis for treatment or other patient management decisions. A negative result may occur with  improper specimen collection/handling, submission of specimen other than nasopharyngeal swab, presence of viral mutation(s) within the areas targeted by this assay, and inadequate number of viral copies(<138 copies/mL). A negative result must be combined with clinical observations, patient history, and epidemiological information. The expected result is Negative.  Fact Sheet for Patients:  BloggerCourse.com  Fact Sheet for Healthcare Providers:  SeriousBroker.it  This test is no t yet approved or cleared by the [place] FDA and  has been authorized for detection and/or diagnosis of SARS-CoV-2 by FDA under an Emergency Use Authorization (EUA). This EUA will remain  in effect (meaning this test can be used) for the duration of the COVID-19 declaration under Section 564(b)(1) of the Act, 21 U.S.C.section 360bbb-3(b)(1), unless the authorization is terminated  or revoked sooner.       Influenza A by PCR NEGATIVE NEGATIVE Final   Influenza B by  PCR NEGATIVE NEGATIVE Final    Comment: (NOTE) The Xpert Xpress SARS-CoV-2/FLU/RSV plus assay is intended as an aid in the diagnosis of influenza from Nasopharyngeal swab specimens and should not be used as a sole basis for treatment. Nasal washings and aspirates are unacceptable for Xpert Xpress SARS-CoV-2/FLU/RSV testing.  Fact Sheet for  Patients: BloggerCourse.com  Fact Sheet for Healthcare Providers: SeriousBroker.it  This test is not yet approved or cleared by the [place] FDA and has been authorized for detection and/or diagnosis of SARS-CoV-2 by FDA under an Emergency Use Authorization (EUA). This EUA will remain in effect (meaning this test can be used) for the duration of the COVID-19 declaration under Section 564(b)(1) of the Act, 21 U.S.C. section 360bbb-3(b)(1), unless the authorization is terminated or revoked.  Performed at [facility], [address] [place], [place] [address]   Blood Culture (routine x 2)     Status: None (Preliminary result)   Collection Time: 01/25/22  6:30 PM   Specimen: Right Antecubital; Blood  Result Value Ref Range Status   Specimen Description   Final    RIGHT ANTECUBITAL BLOOD Performed at [facility], [address], [place], [place] [address]    Special Requests   Final    BOTTLES DRAWN AEROBIC AND ANAEROBIC Blood Culture adequate volume Performed at [facility], [address], [place], [place] [address]    Culture   Final    NO GROWTH 2 DAYS Performed at [facility], [address], [place], [place] [address]    Report Status PENDING  Incomplete  Urine Culture     Status: Abnormal   Collection Time: 01/25/22  8:38 PM   Specimen: Urine, Clean Catch  Result Value Ref Range Status   Specimen Description   Final    URINE, CLEAN CATCH Performed at [facility], [address], [place], [place] [address]    Special Requests   Final    NONE Performed at [facility], [address], [place], [place] [address]    Culture (A)  Final    >=100,000 COLONIES/mL LACTOBACILLUS SPECIES 30,000 COLONIES/mL GROUP B STREP(S.AGALACTIAE)ISOLATED TESTING AGAINST S. AGALACTIAE NOT ROUTINELY PERFORMED DUE TO PREDICTABILITY OF AMP/PEN/VAN  SUSCEPTIBILITY. Standardized susceptibility testing for this organism is not available. LACTOBACILLUS SPECIES    Report Status 01/27/2022 FINAL  Final     Time coordinating discharge: Over 30 minutes  SIGNED:   Cipriano Bunker, MD  [facility] 01/27/2022, 3:52 PM Pager   If 7PM-7AM, please contact night-coverage

## 2022-01-27 NOTE — Discharge Instructions (Signed)
Advised to follow-up with primary care physician in 1 week. Advised to take doxycycline for 10 days for UTI and for foot cellulitis. MRI negative for osteomyelitis.  Urine culture no growth so far

## 2022-01-27 NOTE — Progress Notes (Signed)
  Transition of Care [facility]) Screening Note   Patient Details  Name: [name] Date of Birth: 1955/06/07   Transition of Care [facility] CM/SW Contact:    Vassie Moselle, LCSW Phone Number: 01/27/2022, 10:48 AM    Transition of Care Department [facility]) has reviewed patient and no TOC needs have been identified at this time. We will continue to monitor patient advancement through interdisciplinary progression rounds. If new patient transition needs arise, please place a TOC consult.

## 2022-01-27 NOTE — Plan of Care (Signed)
Patient is awake in bed, he is stable with no complaints at this time

## 2022-01-27 NOTE — Progress Notes (Signed)
Mobility Specialist - Progress Note   01/27/22 0911  Mobility  Activity Ambulated independently in hallway  Level of Assistance Independent after set-up  Assistive Device None  Distance Ambulated (ft) 1000 ft  Activity Response Tolerated well  Mobility Referral Yes  $Mobility charge 1 Mobility   Pt received in bed and agreed for mobility, no c/o pain nor discomfort during ambulation. Pt back to bed with all needs met.   Roderick Pee Mobility Specialist

## 2022-01-29 DIAGNOSIS — Z1331 Encounter for screening for depression: Secondary | ICD-10-CM | POA: Diagnosis not present

## 2022-01-29 DIAGNOSIS — Z6841 Body Mass Index (BMI) 40.0 and over, adult: Secondary | ICD-10-CM | POA: Diagnosis not present

## 2022-01-29 DIAGNOSIS — G4733 Obstructive sleep apnea (adult) (pediatric): Secondary | ICD-10-CM | POA: Diagnosis not present

## 2022-01-29 DIAGNOSIS — E1142 Type 2 diabetes mellitus with diabetic polyneuropathy: Secondary | ICD-10-CM | POA: Diagnosis not present

## 2022-01-29 DIAGNOSIS — I10 Essential (primary) hypertension: Secondary | ICD-10-CM | POA: Diagnosis not present

## 2022-01-29 DIAGNOSIS — I25119 Atherosclerotic heart disease of native coronary artery with unspecified angina pectoris: Secondary | ICD-10-CM | POA: Diagnosis not present

## 2022-01-30 LAB — CULTURE, BLOOD (ROUTINE X 2)
Culture: NO GROWTH
Culture: NO GROWTH
Special Requests: ADEQUATE
Special Requests: ADEQUATE

## 2022-01-31 ENCOUNTER — Ambulatory Visit: Payer: Medicare PPO | Admitting: Podiatry

## 2022-01-31 DIAGNOSIS — L97522 Non-pressure chronic ulcer of other part of left foot with fat layer exposed: Secondary | ICD-10-CM

## 2022-01-31 DIAGNOSIS — E1140 Type 2 diabetes mellitus with diabetic neuropathy, unspecified: Secondary | ICD-10-CM

## 2022-02-03 NOTE — Progress Notes (Signed)
Subjective: Chief Complaint  Patient presents with   Diabetic Ulcer    Diabetic foot ulcer, TX: surg shoe [id number]    66 year old male presents the office today for follow-up evaluation of a wound on his left foot.  States the wound is doing better.  He has been keeping Medihoney on the wound.  He did have to the hospital last weekend for concern for sepsis but currently of UTI as well.  Objective: AAO x3, NAD-wearing surgical shoe DP/PT pulses palpable bilaterally, CRT less than 3 seconds Ulceration present on the midfoot.  See pictures below.  Prior to debridement there is hyperkeratotic tissue present on the periphery of the wound.  Prior to debridement measured 0.4 x 0.4 x 0.2 cm after debridement measures 0.5 x 0.5 x 0.2 cm.  There is no probing, undermining or tunneling.  There is no fluctuation or crepitation.  There is no malodor. Flatfoot present bilaterally No pain with calf compression, swelling, warmth, erythema      Assessment: Left foot ulceration  Plan: -All treatment options discussed with the patient including all alternatives, risks, complications.  -Medically necessary wound debridement was performed today.  I sharply debrided the wound utilizing 312 with scalpel and to healthy, bleeding, granular tissue to remove nonviable tissue in order to promote wound healing.  Pre and post wound measurements are noted above.  Minimal bleeding.  Hemostasis achieved through manual compression.  Irrigated with saline and Silvadene was applied followed by dressing.  Recommended daily dressing changes and offloading.  He can continue Medihoney dressing changes.  Monitor for any signs or symptoms of worsening infection report to the emergency room should any occur.  Follow-up in 1 week as already scheduled. -Discussed different shoes to wear once the wound heals to help avoid further pressure to help prevent reoccurrence.  Previously discussed surgical intervention if needed as well for  the foot. -Patient encouraged to call the office with any questions, concerns, change in symptoms.   Trula Slade DPM

## 2022-02-05 DIAGNOSIS — D72829 Elevated white blood cell count, unspecified: Secondary | ICD-10-CM | POA: Diagnosis not present

## 2022-02-05 DIAGNOSIS — N390 Urinary tract infection, site not specified: Secondary | ICD-10-CM | POA: Diagnosis not present

## 2022-02-05 DIAGNOSIS — E876 Hypokalemia: Secondary | ICD-10-CM | POA: Diagnosis not present

## 2022-02-05 DIAGNOSIS — F4321 Adjustment disorder with depressed mood: Secondary | ICD-10-CM | POA: Diagnosis not present

## 2022-02-10 ENCOUNTER — Other Ambulatory Visit: Payer: Self-pay

## 2022-02-10 ENCOUNTER — Other Ambulatory Visit: Payer: Self-pay | Admitting: Podiatry

## 2022-02-10 ENCOUNTER — Encounter: Payer: Self-pay | Admitting: Podiatry

## 2022-02-10 ENCOUNTER — Telehealth: Payer: Self-pay | Admitting: Podiatry

## 2022-02-10 ENCOUNTER — Telehealth: Payer: Self-pay | Admitting: Orthopaedic Surgery

## 2022-02-10 MED ORDER — CELECOXIB 200 MG PO CAPS: ORAL_CAPSULE | ORAL | 1 refills | Status: DC

## 2022-02-10 NOTE — Telephone Encounter (Signed)
Pt needs refill on his celebrax called in please he will be out as of tomorrow.

## 2022-02-10 NOTE — Telephone Encounter (Signed)
Sure that is totally fine.  Thank you

## 2022-02-10 NOTE — Telephone Encounter (Signed)
Patient called in stating his back is not hurting as bad and last time he came in for back pains Xu sent him to Physical Therapy and it was working well and he would like to try that again at [facility] [address]  [phone]

## 2022-02-11 ENCOUNTER — Other Ambulatory Visit: Payer: Self-pay

## 2022-02-11 ENCOUNTER — Ambulatory Visit: Payer: Medicare PPO | Admitting: Orthopaedic Surgery

## 2022-02-11 DIAGNOSIS — M545 Low back pain, unspecified: Secondary | ICD-10-CM

## 2022-02-11 NOTE — Telephone Encounter (Signed)
Noted, thanks!

## 2022-02-12 DIAGNOSIS — Z6841 Body Mass Index (BMI) 40.0 and over, adult: Secondary | ICD-10-CM | POA: Diagnosis not present

## 2022-02-12 DIAGNOSIS — I10 Essential (primary) hypertension: Secondary | ICD-10-CM | POA: Diagnosis not present

## 2022-02-12 DIAGNOSIS — G4733 Obstructive sleep apnea (adult) (pediatric): Secondary | ICD-10-CM | POA: Diagnosis not present

## 2022-02-12 DIAGNOSIS — E1142 Type 2 diabetes mellitus with diabetic polyneuropathy: Secondary | ICD-10-CM | POA: Diagnosis not present

## 2022-02-17 ENCOUNTER — Ambulatory Visit: Payer: Medicare PPO | Admitting: Podiatry

## 2022-02-17 DIAGNOSIS — E1140 Type 2 diabetes mellitus with diabetic neuropathy, unspecified: Secondary | ICD-10-CM | POA: Diagnosis not present

## 2022-02-17 DIAGNOSIS — L97522 Non-pressure chronic ulcer of other part of left foot with fat layer exposed: Secondary | ICD-10-CM

## 2022-02-17 NOTE — Progress Notes (Signed)
Subjective: No chief complaint on file.   66 year old male presents the office today for follow-up evaluation of a wound on his left foot.  States the wound is doing better.  He has been keeping dressing on the wound.  No drainage or pus that he reports.  No fevers or chills.  Objective: AAO x3, NAD-wearing surgical shoe DP/PT pulses palpable bilaterally, CRT less than 3 seconds Ulceration present on the midfoot.  Prior to debridement there is hyperkeratotic tissue present on the periphery of the wound.  Prior to debridement measured 0.5 x 0.4 x 0.2 cm after debridement measures 0.5 x 0.5 x 0.2 cm.  Wound appears to be about the same in size compared to last appointment.  There is no probing, undermining or tunneling.  There is no fluctuation or crepitation.  There is no malodor. Flatfoot present bilaterally No pain with calf compression, swelling, warmth, erythema   Assessment: Left foot ulceration  Plan: -All treatment options discussed with the patient including all alternatives, risks, complications.  -Medically necessary wound debridement was performed today.  I sharply debrided the wound utilizing 312 with scalpel and to healthy, bleeding, granular tissue to remove nonviable tissue in order to promote wound healing.  Pre and post wound measurements are noted above.  Minimal bleeding.  Hemostasis achieved through manual compression.  Irrigated with saline and Silvadene was applied followed by dressing.  Recommended daily dressing changes and offloading. Monitor for any signs or symptoms of worsening infection report to the emergency room should any occur.  Follow-up in 1 week as already scheduled. -Plan to switch using Prisma dressing changes daily. -Patient encouraged to call the office with any questions, concerns, change in symptoms.   Trula Slade DPM

## 2022-02-18 DIAGNOSIS — M2560 Stiffness of unspecified joint, not elsewhere classified: Secondary | ICD-10-CM | POA: Diagnosis not present

## 2022-02-18 DIAGNOSIS — M6281 Muscle weakness (generalized): Secondary | ICD-10-CM | POA: Diagnosis not present

## 2022-02-18 DIAGNOSIS — M5459 Other low back pain: Secondary | ICD-10-CM | POA: Diagnosis not present

## 2022-02-20 DIAGNOSIS — M2560 Stiffness of unspecified joint, not elsewhere classified: Secondary | ICD-10-CM | POA: Diagnosis not present

## 2022-02-20 DIAGNOSIS — M6281 Muscle weakness (generalized): Secondary | ICD-10-CM | POA: Diagnosis not present

## 2022-02-20 DIAGNOSIS — M5459 Other low back pain: Secondary | ICD-10-CM | POA: Diagnosis not present

## 2022-02-22 DIAGNOSIS — B349 Viral infection, unspecified: Secondary | ICD-10-CM | POA: Diagnosis not present

## 2022-02-22 DIAGNOSIS — R5383 Other fatigue: Secondary | ICD-10-CM | POA: Diagnosis not present

## 2022-02-22 DIAGNOSIS — E11610 Type 2 diabetes mellitus with diabetic neuropathic arthropathy: Secondary | ICD-10-CM | POA: Diagnosis not present

## 2022-02-22 DIAGNOSIS — R6883 Chills (without fever): Secondary | ICD-10-CM | POA: Diagnosis not present

## 2022-02-22 DIAGNOSIS — Z03818 Encounter for observation for suspected exposure to other biological agents ruled out: Secondary | ICD-10-CM | POA: Diagnosis not present

## 2022-02-23 ENCOUNTER — Emergency Department (HOSPITAL_BASED_OUTPATIENT_CLINIC_OR_DEPARTMENT_OTHER)
Admission: EM | Admit: 2022-02-23 | Discharge: 2022-02-23 | Disposition: A | Discharge status: Home / Self Care | Payer: Medicare PPO | Attending: Emergency Medicine | Admitting: Emergency Medicine

## 2022-02-23 ENCOUNTER — Encounter (HOSPITAL_BASED_OUTPATIENT_CLINIC_OR_DEPARTMENT_OTHER): Payer: Self-pay | Admitting: Emergency Medicine

## 2022-02-23 ENCOUNTER — Other Ambulatory Visit: Payer: Self-pay

## 2022-02-23 DIAGNOSIS — I10 Essential (primary) hypertension: Secondary | ICD-10-CM | POA: Insufficient documentation

## 2022-02-23 DIAGNOSIS — I2510 Atherosclerotic heart disease of native coronary artery without angina pectoris: Secondary | ICD-10-CM | POA: Insufficient documentation

## 2022-02-23 DIAGNOSIS — Z7982 Long term (current) use of aspirin: Secondary | ICD-10-CM | POA: Insufficient documentation

## 2022-02-23 DIAGNOSIS — L03116 Cellulitis of left lower limb: Secondary | ICD-10-CM | POA: Diagnosis not present

## 2022-02-23 DIAGNOSIS — E119 Type 2 diabetes mellitus without complications: Secondary | ICD-10-CM | POA: Diagnosis not present

## 2022-02-23 DIAGNOSIS — M79672 Pain in left foot: Secondary | ICD-10-CM | POA: Diagnosis present

## 2022-02-23 DIAGNOSIS — Z7984 Long term (current) use of oral hypoglycemic drugs: Secondary | ICD-10-CM | POA: Diagnosis not present

## 2022-02-23 DIAGNOSIS — Z79899 Other long term (current) drug therapy: Secondary | ICD-10-CM | POA: Diagnosis not present

## 2022-02-23 LAB — CBC WITH DIFFERENTIAL/PLATELET
Abs Immature Granulocytes: 0.02 10*3/uL (ref 0.00–0.07)
Basophils Absolute: 0 10*3/uL (ref 0.0–0.1)
Basophils Relative: 1 %
Eosinophils Absolute: 0.2 10*3/uL (ref 0.0–0.5)
Eosinophils Relative: 3 %
HCT: 49 % (ref 39.0–52.0)
Hemoglobin: 16.3 g/dL (ref 13.0–17.0)
Immature Granulocytes: 0 %
Lymphocytes Relative: 22 %
Lymphs Abs: 1.4 10*3/uL (ref 0.7–4.0)
MCH: 29.2 pg (ref 26.0–34.0)
MCHC: 33.3 g/dL (ref 30.0–36.0)
MCV: 87.7 fL (ref 80.0–100.0)
Monocytes Absolute: 0.7 10*3/uL (ref 0.1–1.0)
Monocytes Relative: 10 %
Neutro Abs: 4.2 10*3/uL (ref 1.7–7.7)
Neutrophils Relative %: 64 %
Platelets: 142 10*3/uL — ABNORMAL LOW (ref 150–400)
RBC: 5.59 MIL/uL (ref 4.22–5.81)
RDW: 16 % — ABNORMAL HIGH (ref 11.5–15.5)
WBC: 6.5 10*3/uL (ref 4.0–10.5)
nRBC: 0 % (ref 0.0–0.2)

## 2022-02-23 LAB — BASIC METABOLIC PANEL
Anion gap: 13 (ref 5–15)
BUN: 16 mg/dL (ref 8–23)
CO2: 27 mmol/L (ref 22–32)
Calcium: 9.6 mg/dL (ref 8.9–10.3)
Chloride: 98 mmol/L (ref 98–111)
Creatinine, Ser: 1.08 mg/dL (ref 0.61–1.24)
GFR, Estimated: >60 mL/min (ref >60)
Glucose, Bld: 173 mg/dL — ABNORMAL HIGH (ref 70–99)
Potassium: 3.3 mmol/L — ABNORMAL LOW (ref 3.5–5.1)
Sodium: 138 mmol/L (ref 135–145)

## 2022-02-23 MED ORDER — DEXTROSE 5 % IV SOLN
1500.0000 mg | Freq: Once | INTRAVENOUS | Status: AC
  Administered 2022-02-23: 1500 mg via INTRAVENOUS
  Filled 2022-02-23: qty 75

## 2022-02-23 NOTE — ED Triage Notes (Signed)
Pt presents to ED POV. Pt c/o wound to top of L foot. Pt reports that he was having chills Friday, malaise Saturday, and then redness to L ankle today. Reports taking keflex at home beginning Friday on his own.

## 2022-02-23 NOTE — Discharge Instructions (Addendum)
You were seen today for cellulitis.  You were administered a medication called Dalvance.  This medication should help resolve your cellulitis in your left lower leg.  I have attached medication information for you to read.  Please follow-up as needed with your primary care provider.

## 2022-02-23 NOTE — ED Provider Notes (Signed)
[facility] EMERGENCY DEPT Provider Note   CSN: [id number] Arrival date & time: 02/23/22  1319     History  Chief Complaint  Patient presents with   Foot Pain    [name] is a 66 y.o. male.  Patient presents with a chief complaint of redness spreading from the left ankle up the left lower leg.  Patient states he noticed it Friday.  He has a history of a wound on the bottom of his foot which is followed by podiatry but which is healing well with no drainage and no redness around the wound.  This morning he was at breakfast and he mentioned the redness that he had noticed to his daughter who works with podiatry.  She took a picture of the redness on the leg and sent it to one of her coworkers who recommended he come to the emergency department for evaluation for possibly developing sepsis.  Upon arrival at the emergency department the patient has no complaints other than the redness in his left lower leg.  The redness is not contiguous with the wound on the bottom of his foot.  The patient states he has been trying Keflex at home with no relief.  Past medical history significant for admission for ulcer on the bottom of the foot in October of this year.  Other past medical history significant for CAD, hypertension, hyperlipidemia, morbid obesity, gout, peripheral neuropathy, type II DM HPI     Home Medications Prior to Admission medications   Medication Sig Start Date End Date Taking? Authorizing Provider  acetaminophen (TYLENOL) 650 MG CR tablet Take 1,300 mg by mouth at bedtime.    [provider]  aspirin EC 325 MG tablet Take 325 mg by mouth at bedtime.    [provider]  atorvastatin (LIPITOR) 80 MG tablet Take 80 mg by mouth at bedtime.  02/09/13   [provider]  celecoxib (CELEBREX) 200 MG capsule TAKE 1 CAPSULE BY MOUTH EVERY DAY 02/10/22   Vivi Barrack, DPM  Cholecalciferol (VITAMIN D3) 2000 units TABS Take 2,000 Units by mouth  daily.    [provider]  empagliflozin (JARDIANCE) 25 MG TABS tablet Take 1 tablet (25 mg total) by mouth daily. 01/09/22   Lyn Records, MD  glimepiride (AMARYL) 2 MG tablet Take 2 mg by mouth daily. 12/03/20   [provider]  Glucosamine-Chondroitin (COSAMIN DS PO) Take 2 capsules by mouth daily with breakfast.    [provider]  isosorbide mononitrate (IMDUR) 120 MG 24 hr tablet TAKE 1 TABLET BY MOUTH EVERY DAY 01/08/22   Lyn Records, MD  Menthol, Topical Analgesic, 4 % GEL Apply 1 application topically See admin instructions. Apply to both feet at bedtime for neuropathy    [provider]  metoprolol succinate (TOPROL-XL) 100 MG 24 hr tablet TAKE 1 TABLET BY MOUTH DAILY. PLEASE CALL TO SCHEDULE AN OVERDUE APPOINTMENT WITH DR. Katrinka Blazing FOR REFILLS, [phone], THANK YOU. 3RD ATTEMPT. NO MORE REFILLS GIVEN 01/07/22   Lyn Records, MD  Multiple Vitamin (MULTIVITAMIN) tablet Take 1 tablet by mouth daily.    [provider]  nitroGLYCERIN (NITROSTAT) 0.4 MG SL tablet Place 1 tablet (0.4 mg total) under the tongue every 5 (five) minutes x 3 doses as needed for chest pain. 12/07/20   Lyn Records, MD  omeprazole (PRILOSEC) 20 MG capsule Take 20 mg by mouth daily.  02/03/13   [provider]  pregabalin (LYRICA) 150 MG capsule Take  1 capsule (150 mg total) by mouth 2 (two) times daily. 10/08/21   Vivi Barrack, DPM  senna-docusate (SENOKOT S) 8.6-50 MG tablet Take 1 tablet by mouth at bedtime as needed. Patient taking differently: Take 2 tablets by mouth at bedtime. 01/29/18   Tarry Kos, MD  Tetrahydrozoline HCl (VISINE OP) Place 1 drop into both eyes daily as needed (for dry or irritated eyes).     [provider]  triamterene-hydrochlorothiazide (MAXZIDE) 75-50 MG per tablet Take 0.5 tablets by mouth daily.  01/06/13   [provider]  WELCHOL 625 MG tablet Take 1,875 mg by mouth at bedtime.  05/19/13   [provider]      Allergies    Metformin and Penicillins    Review of Systems   Review of Systems  Skin:  Positive for color change and wound.    Physical Exam Updated Vital Signs BP 124/76   Pulse 69   Temp 97.8 F (36.6 C) (Oral)   Resp 18   SpO2 96%  Physical Exam Vitals and nursing note reviewed.  Constitutional:      General: He is not in acute distress.    Appearance: He is well-developed.  HENT:     Head: Normocephalic and atraumatic.  Eyes:     Conjunctiva/sclera: Conjunctivae normal.  Cardiovascular:     Rate and Rhythm: Normal rate and regular rhythm.     Heart sounds: No murmur heard. Pulmonary:     Effort: Pulmonary effort is normal. No respiratory distress.     Breath sounds: Normal breath sounds.  Abdominal:     Palpations: Abdomen is soft.     Tenderness: There is no abdominal tenderness.  Musculoskeletal:        General: No swelling.     Cervical back: Neck supple.  Skin:    General: Skin is warm and dry.     Capillary Refill: Capillary refill takes less than 2 seconds.     Findings: Erythema present.     Comments: See redness in image below  Neurological:     Mental Status: He is alert.  Psychiatric:        Mood and Affect: Mood normal.     ED Results / Procedures / Treatments   Labs (all labs ordered are listed, but only abnormal results are displayed) Labs Reviewed  CBC WITH DIFFERENTIAL/PLATELET - Abnormal; Notable for the following components:      Result Value   RDW 16.0 (*)    Platelets 142 (*)    All other components within normal limits  BASIC METABOLIC PANEL - Abnormal; Notable for the following components:   Potassium 3.3 (*)    Glucose, Bld 173 (*)    All other components within normal limits    EKG None  Radiology No results found.  Procedures Procedures    Medications Ordered in ED Medications  dalbavancin (DALVANCE) 1,500 mg in dextrose 5 % 500 mL IVPB (1,500 mg Intravenous New Bag/Given 02/23/22 2045)     ED Course/ Medical Decision Making/ A&P                           Medical Decision Making Amount and/or Complexity of Data Reviewed Labs: ordered.   Patient presents with chief concern of erythema.  Differential diagnosis includes but is not limited to cellulitis, venous stasis, diabetic ulcer, and others  I reviewed the patient past medical history: Discharge summary for admission in October  of this year for foot ulcer which is currently followed by podiatry  I ordered and reviewed labs.  Pertinent results include no elevation in white count, potassium 3.3  There is no wound that is visible in the area of the erythema.  I see no indication for imaging at this time.  No concern for osteomyelitis at this time  The patient's presentation is consistent with cellulitis.  I have ordered Dalvance.  The Dalvance was administered.  The patient tolerated the infusion well.  The patient may discharge home at this time.  Recommend follow-up with his primary care physician as needed       Final Clinical Impression(s) / ED Diagnoses Final diagnoses:  Cellulitis of left lower extremity    Rx / DC Orders ED Discharge Orders          Ordered    Ambulatory referral to Infectious Disease       Comments: Cellulitis patient:  Received dalbavancin on 02/23/2022.   02/23/22 1715              Darrick Grinder, PA-C 02/23/22 2123    Rondel Baton, MD 02/25/22 1625

## 2022-02-23 NOTE — Progress Notes (Signed)
Pharmacy Note: dalbavancin (DALVANCE) for Acute Bacterial Skin and Skin Structure Infection (ABSSSI) Patients to [facility] Discharge   [name] is a 66 y.o. male who presented to [facility] on 02/23/2022 with an ABSSSI.  Inclusion criteria:  Indication: Cellulitis  Patient has at least one SIRS criteria present: Temp > 100.9 or < 96.8   Exclusion criteria: Patient was evaluated and negative for hardware involvement, hypotension / shock, elevated lactate > 2 without other explanation, gram-negative infection risk factors (bites, water exposure, infection after trauma, infection after skin graft, burns, severe immunocompromise), necrotizing fasciitis possible / confirmed, known or suspected osteomyelitis or septic arthritis, endocarditis, diabetic foot infection, ischemic ulcers, post-operative wound infection, perirectal infection, need for drainage in the OR, hand / facial infections, injection drug users with  fever, bacteremia, pregnancy or breastfeeding, allergy related to antibiotics like vancomycin, known liver disease ( T.Bili > 2x ULN or [name]/ALT > 3x ULN).  [name] 02/23/2022, 5:30 PM Clinical Pharmacist

## 2022-02-23 NOTE — ED Provider Triage Note (Signed)
Emergency Medicine Provider Triage Evaluation Note  [name] , a 66 y.o. male  was evaluated in triage.  Pt complains of spreading redness from a wound on the left foot. Patient is concerned about spreading redness from the area of the wound.  The patient was admitted to the hospital in mid October Due to the infection.  He has been followed at [facility] since that time.  His family works at that office and saw his foot this morning/ankle redness into the picture to one of the physicians at the podiatrist who recommended he come to the emergency department for blood work to ensure that is not septic at this time. Review of Systems  Positive: Chills Negative: Fever, shortness of breath, nausea  Physical Exam  BP 118/88   Pulse 84   Temp 97.9 F (36.6 C)   Resp 20   SpO2 96%  Gen:   Awake, no distress   Resp:  Normal effort  MSK:   Moves extremities without difficulty  Other:  Erythema spreading up patient's left foot up to level of ankle  Medical Decision Making  Medically screening exam initiated at 2:16 PM.  Appropriate orders placed.  [name] was informed that the remainder of the evaluation will be completed by another provider, this initial triage assessment does not replace that evaluation, and the importance of remaining in the ED until their evaluation is complete.     Darrick Grinder, PA-C 02/23/22 1420

## 2022-02-24 ENCOUNTER — Other Ambulatory Visit (HOSPITAL_COMMUNITY): Payer: Self-pay

## 2022-02-24 DIAGNOSIS — M2560 Stiffness of unspecified joint, not elsewhere classified: Secondary | ICD-10-CM | POA: Diagnosis not present

## 2022-02-24 DIAGNOSIS — M6281 Muscle weakness (generalized): Secondary | ICD-10-CM | POA: Diagnosis not present

## 2022-02-24 DIAGNOSIS — M5459 Other low back pain: Secondary | ICD-10-CM | POA: Diagnosis not present

## 2022-02-25 ENCOUNTER — Ambulatory Visit: Payer: Medicare PPO | Admitting: Podiatry

## 2022-02-25 DIAGNOSIS — E1140 Type 2 diabetes mellitus with diabetic neuropathy, unspecified: Secondary | ICD-10-CM | POA: Diagnosis not present

## 2022-02-25 DIAGNOSIS — L03116 Cellulitis of left lower limb: Secondary | ICD-10-CM | POA: Diagnosis not present

## 2022-02-25 DIAGNOSIS — L97522 Non-pressure chronic ulcer of other part of left foot with fat layer exposed: Secondary | ICD-10-CM | POA: Diagnosis not present

## 2022-02-25 NOTE — Patient Instructions (Signed)
Continue antibiotics Use medihoney on the ulcer daily

## 2022-02-25 NOTE — Progress Notes (Signed)
Subjective: Chief Complaint  Patient presents with   Foot Ulcer    Left foot ulcer, seen at the ER over the weekend, patient denies any drainage, no pain,  TX: Keflex 500 mg started Friday    66 year old male presents the office today for evaluation of ulcer on his left foot.  He states he went to urgent care and was told it was not infected and then over the weekend started getting worse as far as fevers he was found the redness of his leg.  He was directed to the emergency room.  He has been taking Keflex.  Denies any fevers or chills.    Objective: AAO x3, NAD DP/PT pulses palpable bilaterally, CRT less than 3 seconds Ulceration noted on the plantar aspect of the midfoot medially.  Has a granular base but just adjacent to this on the medial aspect there appears to be a blister there is no drainage or pus coming from this there is no fluctuation crepitation.  Has a granular base but any probing, no tunneling.  No fluctuance or crepitation.  No malodor. Area of erythema was on the leg and did not correspond to the area of the wound as there was a area of clear skin.  Number to the pictures is much improved. No pain with calf compression, swelling, warmth, erythema  Assessment: Ulceration with resolving cellulitis  Plan: -All treatment options discussed with the patient including all alternatives, risks, complications.  -Medically necessary wound debridement is performed.  Sharp debridement today with any complications, healthy, appearing tissue.  Continue offloading daily dressing changes.  Continue antibiotics.  Monitor for any signs or symptoms of worsening infection or recurrence.  I will see him back next week or sooner if any issues arise. -Patient encouraged to call the office with any questions, concerns, change in symptoms.   [name]

## 2022-02-26 DIAGNOSIS — E1142 Type 2 diabetes mellitus with diabetic polyneuropathy: Secondary | ICD-10-CM | POA: Diagnosis not present

## 2022-02-26 DIAGNOSIS — G4733 Obstructive sleep apnea (adult) (pediatric): Secondary | ICD-10-CM | POA: Diagnosis not present

## 2022-02-26 DIAGNOSIS — Z6838 Body mass index (BMI) 38.0-38.9, adult: Secondary | ICD-10-CM | POA: Diagnosis not present

## 2022-03-03 ENCOUNTER — Other Ambulatory Visit: Payer: Self-pay

## 2022-03-03 ENCOUNTER — Ambulatory Visit: Payer: Medicare PPO | Admitting: Podiatry

## 2022-03-03 ENCOUNTER — Ambulatory Visit: Payer: Medicare PPO | Admitting: Infectious Disease

## 2022-03-03 ENCOUNTER — Encounter: Payer: Self-pay | Admitting: Infectious Disease

## 2022-03-03 VITALS — BP 124/83 | HR 84 | Resp 16 | Ht 68.0 in | Wt 257.0 lb

## 2022-03-03 DIAGNOSIS — Z7985 Long-term (current) use of injectable non-insulin antidiabetic drugs: Secondary | ICD-10-CM | POA: Diagnosis not present

## 2022-03-03 DIAGNOSIS — L03116 Cellulitis of left lower limb: Secondary | ICD-10-CM | POA: Diagnosis not present

## 2022-03-03 DIAGNOSIS — B351 Tinea unguium: Secondary | ICD-10-CM | POA: Diagnosis not present

## 2022-03-03 DIAGNOSIS — E11628 Type 2 diabetes mellitus with other skin complications: Secondary | ICD-10-CM | POA: Diagnosis not present

## 2022-03-03 DIAGNOSIS — Z7984 Long term (current) use of oral hypoglycemic drugs: Secondary | ICD-10-CM

## 2022-03-03 DIAGNOSIS — Z96652 Presence of left artificial knee joint: Secondary | ICD-10-CM | POA: Diagnosis not present

## 2022-03-03 DIAGNOSIS — L089 Local infection of the skin and subcutaneous tissue, unspecified: Secondary | ICD-10-CM | POA: Diagnosis not present

## 2022-03-03 DIAGNOSIS — I2510 Atherosclerotic heart disease of native coronary artery without angina pectoris: Secondary | ICD-10-CM | POA: Diagnosis not present

## 2022-03-03 DIAGNOSIS — L97522 Non-pressure chronic ulcer of other part of left foot with fat layer exposed: Secondary | ICD-10-CM

## 2022-03-03 DIAGNOSIS — L0390 Cellulitis, unspecified: Secondary | ICD-10-CM

## 2022-03-03 HISTORY — DX: Cellulitis, unspecified: L03.90

## 2022-03-03 HISTORY — DX: Tinea unguium: B35.1

## 2022-03-03 MED ORDER — DOXYCYCLINE HYCLATE 100 MG PO TABS: 100.0000 mg | ORAL_TABLET | Freq: Two times a day (BID) | ORAL | 0 refills | Status: DC

## 2022-03-03 MED ORDER — TERBINAFINE HCL 250 MG PO TABS: 250.0000 mg | ORAL_TABLET | Freq: Every day | ORAL | 2 refills | Status: AC

## 2022-03-03 NOTE — Progress Notes (Signed)
Subjective: Chief Complaint  Patient presents with   Foot Ulcer    Left foot ulcer, patient denies any pain     66 year old male presents the office today for evaluation of ulcer on his left foot.  The wound is doing better.  Some bloody drainage at times but no purulence.  No swelling or redness from the wound itself.  The cellulitis that was present the leg is also improved.  Denies any fevers or chills at this time.   Scheduled see infectious disease later today.  Objective: AAO x3, NAD DP/PT pulses palpable bilaterally, CRT less than 3 seconds Ulceration still present on plantar medial.  See picture below in the picture below was after debridement.  Prior to debridement the wound was smaller measuring approximate 0.8 x 0.8 x 0.1 cm and after debridement I debrided the hyperkeratotic periwound the wound measures approximate 1.2 x 0.9 x 0.2 cm.  There is no probing to bone, and or tunneling.  There is mild rim of erythema without any ascending cellulitis.  No sores or crepitation.  No malodor. Cellulitis of the leg much improved. No pain with calf compression, swelling, warmth, erythema    Assessment: Ulceration with resolving cellulitis  Plan: -All treatment options discussed with the patient including all alternatives, risks, complications.  -Medically necessary wound debridement is performed.  Sharp debridement today with any complications, healthy, appearing tissue with #312 with scalpel.  Continue offloading daily dressing changes.  Hydrofera Blue dressings applied today.  Continue daily dressing changes.  Monitor for any signs or symptoms of worsening infection or recurrence.  I will see him back next week or sooner if any issues arise. -Confirmed erythema continue antibiotic you are prescribed doxycycline.  Other antibiotics are causing terrible taste in the mouth so we decided to switch. -Patient encouraged to call the office with any questions, concerns, change in symptoms.    [name]

## 2022-03-03 NOTE — Progress Notes (Signed)
Subjective:   Reason for infectious disease consult: cellulitis  Requesting Physician: Cherlynn June, PA-C    Patient ID: [name], male    DOB: May 16, 1955, 66 y.o.   MRN: [id number]  HPI  This is a 66 year old Caucasian man with a history of coronary artery disease hyperlipidemia Tamia diabetes mellitus who has had problems with diabetic foot ulcers but no osteomyelitis.  He was hospitalized last June with infection due to an ulceration on the right foot.  This subsequently resolved.  He was then hospitalized in October with erythema around an ulcer on the left side that he has been following with Dr. Earleen Newport.  He was given systemic antibiotics and ultimately discharged.  He then developed abrupt onset of chills fevers nausea and malaise this November.  He was seen in urgent care and tested for COVID-19 which was negative.  Then  Overnight he developed intense erythema of his left lower extremity.  He says the erythema began more proximal from where his wound was and was not adjacent to that.  He had some cephalexin leftover from a prior prescription and began taking that and ultimately went to the emergency department.  He was seen in the ER and given a dose of dalbavancin.  He needs to follow-up closely Dr. Jacqualyn Posey.  MRI from October did NOT show osteomyelitis in the effected foot    Past Medical History:  Diagnosis Date   Arthritis    "ankles, knees" (01/29/2018)   CAD in native artery    cath report from 02/2017 indicates patient had a history of RCA stent >10 years prior to that. At time of that cath he was found to have EF 70%, 20-30% ostial LAD, 30-40% prox LAD, 30-40% Cx, 80-90% Cx beyond OM1, totally occluded RCA with left-to-right collaterals.   Diabetes mellitus type 2 in obese (HCC)    GERD (gastroesophageal reflux disease)    Hyperlipidemia    Hypertension    Morbid obesity ([place])    OSA on CPAP     Past Surgical History:  Procedure Laterality Date    CARDIAC CATHETERIZATION  2008   CORONARY ANGIOPLASTY WITH STENT PLACEMENT  1997   JOINT REPLACEMENT     KNEE ARTHROSCOPY Bilateral    "meniscus repairs"   TONSILLECTOMY     TOTAL KNEE ARTHROPLASTY Left 01/29/2018   TOTAL KNEE ARTHROPLASTY Left 01/29/2018   Procedure: LEFT TOTAL KNEE ARTHROPLASTY;  Surgeon: Leandrew Koyanagi, MD;  Location: [place] [facility];  Service: Orthopedics;  Laterality: Left;   UVULOPALATOPHARYNGOPLASTY  ~ 1994    Family History  Problem Relation Age of Onset   Heart attack Mother    Heart attack Father       Social History   Socioeconomic History   Marital status: Widowed    Spouse name: Not on file   Number of children: Not on file   Years of education: Not on file   Highest education level: Not on file  Occupational History   Not on file  Tobacco Use   Smoking status: Never    Passive exposure: Never   Smokeless tobacco: Never  Vaping Use   Vaping Use: Never used  Substance and Sexual Activity   Alcohol use: Yes    Comment: 01/29/2018 "may have 1 beer/month; if that"   Drug use: Never   Sexual activity: Not Currently  Other Topics Concern   Not on file  Social History Narrative   Not on file   Social Determinants of Health  Financial Resource Strain: Not on file  Food Insecurity: No Food Insecurity (01/26/2022)   Hunger Vital Sign    Worried About Running Out of Food in the Last Year: Never true    Ran Out of Food in the Last Year: Never true  Transportation Needs: No Transportation Needs (01/26/2022)   PRAPARE - Hydrologist (Medical): No    Lack of Transportation (Non-Medical): No  Physical Activity: Not on file  Stress: Not on file  Social Connections: Not on file    Allergies  Allergen Reactions   Metformin Other (See Comments)   Penicillins Rash    Has patient had a PCN reaction causing immediate rash, facial/tongue/throat swelling, SOB or lightheadedness with hypotension: Yes Has patient had a PCN reaction  causing severe rash involving mucus membranes or skin necrosis: Unk Has patient had a PCN reaction that required hospitalization: Unk Has patient had a PCN reaction occurring within the last 10 years: No If all of the above answers are "NO", then may proceed with Cephalosporin use.      Current Outpatient Medications:    acetaminophen (TYLENOL) 650 MG CR tablet, Take 1,300 mg by mouth at bedtime., Disp: , Rfl:    aspirin EC 325 MG tablet, Take 325 mg by mouth at bedtime., Disp: , Rfl:    atorvastatin (LIPITOR) 80 MG tablet, Take 80 mg by mouth at bedtime. , Disp: , Rfl:    celecoxib (CELEBREX) 200 MG capsule, TAKE 1 CAPSULE BY MOUTH EVERY DAY, Disp: 30 capsule, Rfl: 1   Cholecalciferol (VITAMIN D3) 2000 units TABS, Take 2,000 Units by mouth daily., Disp: , Rfl:    doxycycline (VIBRA-TABS) 100 MG tablet, Take 1 tablet (100 mg total) by mouth 2 (two) times daily., Disp: 14 tablet, Rfl: 0   empagliflozin (JARDIANCE) 25 MG TABS tablet, Take 1 tablet (25 mg total) by mouth daily., Disp: 90 tablet, Rfl: 3   glimepiride (AMARYL) 2 MG tablet, Take 2 mg by mouth daily., Disp: , Rfl:    Glucosamine-Chondroitin (COSAMIN DS PO), Take 2 capsules by mouth daily with breakfast., Disp: , Rfl:    isosorbide mononitrate (IMDUR) 120 MG 24 hr tablet, TAKE 1 TABLET BY MOUTH EVERY DAY, Disp: 90 tablet, Rfl: 3   Menthol, Topical Analgesic, 4 % GEL, Apply 1 application topically See admin instructions. Apply to both feet at bedtime for neuropathy, Disp: , Rfl:    metoprolol succinate (TOPROL-XL) 100 MG 24 hr tablet, TAKE 1 TABLET BY MOUTH DAILY. PLEASE CALL TO SCHEDULE AN OVERDUE APPOINTMENT WITH DR. Tamala Julian FOR REFILLS, [phone], THANK YOU. 3RD ATTEMPT. NO MORE REFILLS GIVEN, Disp: 90 tablet, Rfl: 3   MOUNJARO 2.5 MG/0.5ML Pen, SMARTSIG:2.5 Milligram(s) SUB-Q Once a Week, Disp: , Rfl:    Multiple Vitamin (MULTIVITAMIN) tablet, Take 1 tablet by mouth daily., Disp: , Rfl:    nitroGLYCERIN (NITROSTAT) 0.4 MG SL  tablet, Place 1 tablet (0.4 mg total) under the tongue every 5 (five) minutes x 3 doses as needed for chest pain., Disp: 25 tablet, Rfl: 3   omeprazole (PRILOSEC) 20 MG capsule, Take 20 mg by mouth daily. , Disp: , Rfl:    pregabalin (LYRICA) 150 MG capsule, Take 1 capsule (150 mg total) by mouth 2 (two) times daily., Disp: 180 capsule, Rfl: 0   senna-docusate (SENOKOT S) 8.6-50 MG tablet, Take 1 tablet by mouth at bedtime as needed. (Patient taking differently: Take 2 tablets by mouth at bedtime.), Disp: 30 tablet, Rfl: 1   Tetrahydrozoline  HCl (VISINE OP), Place 1 drop into both eyes daily as needed (for dry or irritated eyes). , Disp: , Rfl:    triamterene-hydrochlorothiazide (MAXZIDE) 75-50 MG per tablet, Take 0.5 tablets by mouth daily. , Disp: , Rfl:    WELCHOL 625 MG tablet, Take 1,875 mg by mouth at bedtime. , Disp: , Rfl:     Review of Systems  Constitutional:  Negative for activity change, appetite change, chills, diaphoresis, fatigue, fever and unexpected weight change.  HENT:  Negative for congestion, rhinorrhea, sinus pressure, sneezing, sore throat and trouble swallowing.   Eyes:  Negative for photophobia and visual disturbance.  Respiratory:  Negative for cough, chest tightness, shortness of breath, wheezing and stridor.   Cardiovascular:  Negative for chest pain, palpitations and leg swelling.  Gastrointestinal:  Negative for abdominal distention, abdominal pain, anal bleeding, blood in stool, constipation, diarrhea, nausea and vomiting.  Genitourinary:  Negative for difficulty urinating, dysuria, flank pain and hematuria.  Musculoskeletal:  Negative for arthralgias, back pain, gait problem, joint swelling and myalgias.  Skin:  Positive for color change and wound. Negative for pallor and rash.  Neurological:  Negative for dizziness, tremors, weakness and light-headedness.  Hematological:  Negative for adenopathy. Does not bruise/bleed easily.  Psychiatric/Behavioral:  Negative  for agitation, behavioral problems, confusion, decreased concentration, dysphoric mood and sleep disturbance.        Objective:   Physical Exam Constitutional:      Appearance: He is well-developed.  HENT:     Head: Normocephalic and atraumatic.  Eyes:     Conjunctiva/sclera: Conjunctivae normal.  Cardiovascular:     Rate and Rhythm: Normal rate and regular rhythm.  Pulmonary:     Effort: Pulmonary effort is normal. No respiratory distress.     Breath sounds: No wheezing.  Abdominal:     General: There is no distension.     Palpations: Abdomen is soft.  Musculoskeletal:     Cervical back: Normal range of motion and neck supple.  Skin:    General: Skin is warm and dry.     Coloration: Skin is not pale.     Findings: No erythema or rash.  Neurological:     General: No focal deficit present.     Mental Status: He is alert and oriented to person, place, and time.  Psychiatric:        Mood and Affect: Mood normal.        Behavior: Behavior normal.        Thought Content: Thought content normal.        Judgment: Judgment normal.     Left foot 03/03/2022:    Left leg    Right foot       Assessment & Plan:   Cellulitis of left lower leg:  Did not appear to be associated with his ulcer which is reassuring.  It does not sound like he has had cellulitis before but I did counsel him on the risk of cellulitis recurring.  This was a nonpurulent cellulitis and so did not need anti-MRSA coverage.  The future if he has had similar symptoms cephalexin or Augmentin would be a reasonable choice of antibiotic.  Left lower extremity diabetic foot ulcer: No osteomyelitis on x-ray imaging at this point following closely with Dr. Loreta AveWagner.  Right foot diabetic foot ulcer: He was admitted with infection related to this as well in June.  Fortunately this area has had resolution of that infection and does not have evidence of deep infection such as  osteomyelitis.  TKR on left: no  evidence of infection  Vaccine counseling he is up-to-date on his COVID-19 and flu shots.  I spent 82 minutes with the patient including than 50% of the time in face to face counseling of the patient and the nature of cellulitis, diabetic foot infections personally reviewing MRI foot along with review of medical records in preparation for the visit and during the visit and in coordination of his care.

## 2022-03-04 DIAGNOSIS — K219 Gastro-esophageal reflux disease without esophagitis: Secondary | ICD-10-CM | POA: Diagnosis not present

## 2022-03-04 DIAGNOSIS — Z86010 Personal history of colonic polyps: Secondary | ICD-10-CM | POA: Diagnosis not present

## 2022-03-05 ENCOUNTER — Telehealth: Payer: Self-pay

## 2022-03-05 DIAGNOSIS — M5459 Other low back pain: Secondary | ICD-10-CM | POA: Diagnosis not present

## 2022-03-05 DIAGNOSIS — M2560 Stiffness of unspecified joint, not elsewhere classified: Secondary | ICD-10-CM | POA: Diagnosis not present

## 2022-03-05 DIAGNOSIS — M6281 Muscle weakness (generalized): Secondary | ICD-10-CM | POA: Diagnosis not present

## 2022-03-05 NOTE — Telephone Encounter (Signed)
...     Pre-operative Risk Assessment    Patient Name: [name]  DOB: 13-Dec-1955 MRN: [id number]      Request for Surgical Clearance    Procedure:   endoscopy/colonoscopy  Date of Surgery:  Clearance 05/07/22                                 Surgeon:  dr Kathi Der Surgeon's Group or Practice Name:  [facility] Phone number:  [phone] Fax number:  [phone]   Type of Clearance Requested:   - Medical  - Pharmacy:  Hold Aspirin     Type of Anesthesia:  Not Indicated   Additional requests/questions:    Jola Babinski   03/05/2022, 9:48 AM

## 2022-03-05 NOTE — Telephone Encounter (Signed)
   Name: [name]  DOB: 27-Jun-1955  MRN: [id number]  Primary Cardiologist: Lesleigh Noe, MD   Preoperative team, please contact this patient and set up a phone call appointment for further preoperative risk assessment. Please obtain consent and complete medication review. Thank you for your help.  I confirm that guidance regarding antiplatelet and oral anticoagulation therapy has been completed and, if necessary, noted below.  Patient is be asked to hold Aspirin. He has a history of CAD with prior stenting. Endoscopies and coloscopies are generally considered to have a low bleeding risk (unless biopsies are taken and then considered moderate bleeding risk). Therefore, Aspirin should ideally be continued perioperatively.   Will remove from pre-op pool.   Corrin Parker, PA-C 03/05/2022, 12:31 PM [facility]

## 2022-03-07 DIAGNOSIS — M5459 Other low back pain: Secondary | ICD-10-CM | POA: Diagnosis not present

## 2022-03-07 DIAGNOSIS — M6281 Muscle weakness (generalized): Secondary | ICD-10-CM | POA: Diagnosis not present

## 2022-03-07 DIAGNOSIS — M2560 Stiffness of unspecified joint, not elsewhere classified: Secondary | ICD-10-CM | POA: Diagnosis not present

## 2022-03-10 ENCOUNTER — Telehealth: Payer: Self-pay | Admitting: *Deleted

## 2022-03-10 NOTE — Telephone Encounter (Signed)
Tele pre op appt 04/25/22 @ 9 am. Med rec and consent are done.

## 2022-03-10 NOTE — Telephone Encounter (Signed)
Pt agreeable top plan of care for tele pre op appt 04/25/22 @ 9 am. Med rec and consent are done.     Patient Consent for Virtual Visit        [name] has provided verbal consent on 03/10/2022 for a virtual visit (video or telephone).   CONSENT FOR VIRTUAL VISIT FOR:  [name]  By participating in this virtual visit I agree to the following:  I hereby voluntarily request, consent and authorize [facility] and its employed or contracted physicians, physician assistants, nurse practitioners or other licensed health care professionals (the Practitioner), to provide me with telemedicine health care services (the "Services") as deemed necessary by the treating Practitioner. I acknowledge and consent to receive the Services by the Practitioner via telemedicine. I understand that the telemedicine visit will involve communicating with the Practitioner through live audiovisual communication technology and the disclosure of certain medical information by electronic transmission. I acknowledge that I have been given the opportunity to request an in-person assessment or other available alternative prior to the telemedicine visit and am voluntarily participating in the telemedicine visit.  I understand that I have the right to withhold or withdraw my consent to the use of telemedicine in the course of my care at any time, without affecting my right to future care or treatment, and that the Practitioner or I may terminate the telemedicine visit at any time. I understand that I have the right to inspect all information obtained and/or recorded in the course of the telemedicine visit and may receive copies of available information for a reasonable fee.  I understand that some of the potential risks of receiving the Services via telemedicine include:  Delay or interruption in medical evaluation due to technological equipment failure or disruption; Information transmitted may not be sufficient  (e.g. poor resolution of images) to allow for appropriate medical decision making by the Practitioner; and/or  In rare instances, security protocols could fail, causing a breach of personal health information.  Furthermore, I acknowledge that it is my responsibility to provide information about my medical history, conditions and care that is complete and accurate to the best of my ability. I acknowledge that Practitioner's advice, recommendations, and/or decision may be based on factors not within their control, such as incomplete or inaccurate data provided by me or distortions of diagnostic images or specimens that may result from electronic transmissions. I understand that the practice of medicine is not an exact science and that Practitioner makes no warranties or guarantees regarding treatment outcomes. I acknowledge that a copy of this consent can be made available to me via my patient portal [facility] MyChart), or I can request a printed copy by calling the office of [facility].    I understand that my insurance will be billed for this visit.   I have read or had this consent read to me. I understand the contents of this consent, which adequately explains the benefits and risks of the Services being provided via telemedicine.  I have been provided ample opportunity to ask questions regarding this consent and the Services and have had my questions answered to my satisfaction. I give my informed consent for the services to be provided through the use of telemedicine in my medical care

## 2022-03-11 DIAGNOSIS — M6281 Muscle weakness (generalized): Secondary | ICD-10-CM | POA: Diagnosis not present

## 2022-03-11 DIAGNOSIS — M5459 Other low back pain: Secondary | ICD-10-CM | POA: Diagnosis not present

## 2022-03-11 DIAGNOSIS — M2560 Stiffness of unspecified joint, not elsewhere classified: Secondary | ICD-10-CM | POA: Diagnosis not present

## 2022-03-13 DIAGNOSIS — M2560 Stiffness of unspecified joint, not elsewhere classified: Secondary | ICD-10-CM | POA: Diagnosis not present

## 2022-03-13 DIAGNOSIS — M6281 Muscle weakness (generalized): Secondary | ICD-10-CM | POA: Diagnosis not present

## 2022-03-13 DIAGNOSIS — M5459 Other low back pain: Secondary | ICD-10-CM | POA: Diagnosis not present

## 2022-03-13 DIAGNOSIS — F4321 Adjustment disorder with depressed mood: Secondary | ICD-10-CM | POA: Diagnosis not present

## 2022-03-17 ENCOUNTER — Ambulatory Visit: Payer: Medicare PPO | Admitting: Podiatry

## 2022-03-17 DIAGNOSIS — E1140 Type 2 diabetes mellitus with diabetic neuropathy, unspecified: Secondary | ICD-10-CM

## 2022-03-17 DIAGNOSIS — L97522 Non-pressure chronic ulcer of other part of left foot with fat layer exposed: Secondary | ICD-10-CM | POA: Diagnosis not present

## 2022-03-18 DIAGNOSIS — M2560 Stiffness of unspecified joint, not elsewhere classified: Secondary | ICD-10-CM | POA: Diagnosis not present

## 2022-03-18 DIAGNOSIS — M6281 Muscle weakness (generalized): Secondary | ICD-10-CM | POA: Diagnosis not present

## 2022-03-18 DIAGNOSIS — M5459 Other low back pain: Secondary | ICD-10-CM | POA: Diagnosis not present

## 2022-03-19 NOTE — Progress Notes (Signed)
Subjective: No chief complaint on file.  66 year old male presents the office today for evaluation of ulcer on his left foot.  He is continue with daily dressing changes.  Denies any drainage or pus. The redness from the cellulitis on the left leg is also much improved.  He did get new shoes which he brought in for me to take a look at today as well as inserts that came inside of them.  No fevers or chills.  No other concerns.   He has seen ID.   Objective: AAO x3, NAD DP/PT pulses palpable bilaterally, CRT less than 3 seconds Ulceration still present on plantar medial.  Prior to debridement almost fully covered with callus.  After debridement of the wound today it still present measuring 0.5 x 0.5 x 0.1 cm and a smaller.  On the dorsal aspect there is an area as well that is opened almost the skin fissure.  See picture below.  There is no probing to bone or exposed bone, tendon.  There is no surrounding erythema, ascending cellulitis.  No fluctuance or crepitation.  No malodor. Preulcerative lesion on the right foot on the same area. Cellulitis of the leg much improved and almost completely resolved No pain with calf compression, swelling, warmth, erythema    Assessment: Ulceration with resolving cellulitis  Plan: -All treatment options discussed with the patient including all alternatives, risks, complications.  -Medically necessary wound debridement is performed.  Sharp debridement today with any complications, healthy, appearing tissue with #312 with scalpel.  Continue offloading daily dressing changes.  Hydrofera Blue dressings applied today.  Continue daily dressing changes.  Monitor for any signs or symptoms of worsening infection or recurrence.  -I did with his shoes, inserts that he purchased.  Appears that the inserts are cutting off completely where the wounds are at as well as the bony prominences.  Advised not wearing this.  He brought his custom orthotics as well and went to  modify these to take for the pressure off of the wound areas.  I sent them back to the lab. -Patient encouraged to call the office with any questions, concerns, change in symptoms.   [name]

## 2022-03-20 DIAGNOSIS — M6281 Muscle weakness (generalized): Secondary | ICD-10-CM | POA: Diagnosis not present

## 2022-03-20 DIAGNOSIS — E1142 Type 2 diabetes mellitus with diabetic polyneuropathy: Secondary | ICD-10-CM | POA: Diagnosis not present

## 2022-03-20 DIAGNOSIS — M5459 Other low back pain: Secondary | ICD-10-CM | POA: Diagnosis not present

## 2022-03-20 DIAGNOSIS — M2560 Stiffness of unspecified joint, not elsewhere classified: Secondary | ICD-10-CM | POA: Diagnosis not present

## 2022-03-20 DIAGNOSIS — Z6839 Body mass index (BMI) 39.0-39.9, adult: Secondary | ICD-10-CM | POA: Diagnosis not present

## 2022-03-20 DIAGNOSIS — G4733 Obstructive sleep apnea (adult) (pediatric): Secondary | ICD-10-CM | POA: Diagnosis not present

## 2022-03-25 DIAGNOSIS — M5459 Other low back pain: Secondary | ICD-10-CM | POA: Diagnosis not present

## 2022-03-25 DIAGNOSIS — M6281 Muscle weakness (generalized): Secondary | ICD-10-CM | POA: Diagnosis not present

## 2022-03-25 DIAGNOSIS — M2560 Stiffness of unspecified joint, not elsewhere classified: Secondary | ICD-10-CM | POA: Diagnosis not present

## 2022-03-27 DIAGNOSIS — M5459 Other low back pain: Secondary | ICD-10-CM | POA: Diagnosis not present

## 2022-03-27 DIAGNOSIS — M2560 Stiffness of unspecified joint, not elsewhere classified: Secondary | ICD-10-CM | POA: Diagnosis not present

## 2022-03-27 DIAGNOSIS — M6281 Muscle weakness (generalized): Secondary | ICD-10-CM | POA: Diagnosis not present

## 2022-04-01 ENCOUNTER — Encounter: Payer: Self-pay | Admitting: Podiatry

## 2022-04-01 ENCOUNTER — Ambulatory Visit: Payer: Medicare PPO | Admitting: Podiatry

## 2022-04-01 VITALS — BP 132/85 | HR 61

## 2022-04-01 DIAGNOSIS — E1140 Type 2 diabetes mellitus with diabetic neuropathy, unspecified: Secondary | ICD-10-CM

## 2022-04-01 DIAGNOSIS — M5459 Other low back pain: Secondary | ICD-10-CM | POA: Diagnosis not present

## 2022-04-01 DIAGNOSIS — L97522 Non-pressure chronic ulcer of other part of left foot with fat layer exposed: Secondary | ICD-10-CM

## 2022-04-01 DIAGNOSIS — M6281 Muscle weakness (generalized): Secondary | ICD-10-CM | POA: Diagnosis not present

## 2022-04-01 DIAGNOSIS — M2560 Stiffness of unspecified joint, not elsewhere classified: Secondary | ICD-10-CM | POA: Diagnosis not present

## 2022-04-01 MED ORDER — CEPHALEXIN 500 MG PO CAPS: 500.0000 mg | ORAL_CAPSULE | Freq: Four times a day (QID) | ORAL | 0 refills | Status: DC

## 2022-04-01 NOTE — Progress Notes (Signed)
Subjective: Chief Complaint  Patient presents with   Foot Ulcer    Bilateral foot ulcer, left foot is bleeding     66 year old male presents the office today for evaluation of ulcer on his left foot.  She is the wound is still present.  He denies any drainage or pus or any swelling or redness.  No fever or chills that he reports.  He is continue with daily dressing changes.   Objective: AAO x3, NAD DP/PT pulses palpable bilaterally, CRT less than 3 seconds Ulcerations on the plantar medial aspect of the foot.  Hyperkeratotic tissue surrounds the wound.  Also debrided the wound measured about the same and 0.5 x 0.5 x 0.2 cm with a granular base.  Prior to debridement the wound was smaller measuring 0.3 x 0.2 x 0.1 cm.  Mild edema without any cellulitis noticed.  There is no drainage or pus.  No malodor. Preulcerative lesion noted on the right plantar medial aspect of the midfoot on the talar bulge plantarly. Significant flatfoot is present. No pain with calf compression, swelling, warmth, erythema    Assessment: Ulceration with resolving cellulitis  Plan: -All treatment options discussed with the patient including all alternatives, risks, complications.  -Medically necessary wound debridement is performed.  Sharp debridement today with any complications, healthy, appearing tissue with #312 with scalpel.  Continue offloading daily dressing changes.  Hydrofera Blue dressings applied today.  Continue daily dressing changes.  Monitor for any signs or symptoms of worsening infection or recurrence.  -Will switch to silver collagen dressing was ordered today through prism -Ordered a  Foot Defender boot for offloading. -Finish course of doxycycline -Patient encouraged to call the office with any questions, concerns, change in symptoms.   [name]

## 2022-04-02 ENCOUNTER — Other Ambulatory Visit: Payer: Self-pay | Admitting: Podiatry

## 2022-04-02 MED ORDER — SULFAMETHOXAZOLE-TRIMETHOPRIM 800-160 MG PO TABS: 1.0000 | ORAL_TABLET | Freq: Two times a day (BID) | ORAL | 0 refills | Status: DC

## 2022-04-03 ENCOUNTER — Other Ambulatory Visit: Payer: Self-pay | Admitting: Podiatry

## 2022-04-03 DIAGNOSIS — M2560 Stiffness of unspecified joint, not elsewhere classified: Secondary | ICD-10-CM | POA: Diagnosis not present

## 2022-04-03 DIAGNOSIS — M5459 Other low back pain: Secondary | ICD-10-CM | POA: Diagnosis not present

## 2022-04-03 DIAGNOSIS — M6281 Muscle weakness (generalized): Secondary | ICD-10-CM | POA: Diagnosis not present

## 2022-04-08 DIAGNOSIS — L97522 Non-pressure chronic ulcer of other part of left foot with fat layer exposed: Secondary | ICD-10-CM | POA: Diagnosis not present

## 2022-04-08 DIAGNOSIS — E1140 Type 2 diabetes mellitus with diabetic neuropathy, unspecified: Secondary | ICD-10-CM | POA: Diagnosis not present

## 2022-04-15 ENCOUNTER — Ambulatory Visit: Payer: Medicare PPO | Admitting: Podiatry

## 2022-04-15 ENCOUNTER — Other Ambulatory Visit: Payer: Self-pay | Admitting: Podiatry

## 2022-04-15 ENCOUNTER — Encounter: Payer: Self-pay | Admitting: Podiatry

## 2022-04-15 VITALS — BP 135/73 | HR 65

## 2022-04-15 DIAGNOSIS — B351 Tinea unguium: Secondary | ICD-10-CM

## 2022-04-15 DIAGNOSIS — E1140 Type 2 diabetes mellitus with diabetic neuropathy, unspecified: Secondary | ICD-10-CM

## 2022-04-15 DIAGNOSIS — L97522 Non-pressure chronic ulcer of other part of left foot with fat layer exposed: Secondary | ICD-10-CM

## 2022-04-15 DIAGNOSIS — M79674 Pain in right toe(s): Secondary | ICD-10-CM | POA: Diagnosis not present

## 2022-04-15 DIAGNOSIS — M79675 Pain in left toe(s): Secondary | ICD-10-CM | POA: Diagnosis not present

## 2022-04-16 MED ORDER — PREGABALIN 150 MG PO CAPS: 150.0000 mg | ORAL_CAPSULE | Freq: Two times a day (BID) | ORAL | 0 refills | Status: DC

## 2022-04-16 NOTE — Progress Notes (Signed)
Subjective: Chief Complaint  Patient presents with   Foot Ulcer    Follow up ulcer left   "Its looking okay, bleeding some"     67 year old male presents the office today for evaluation of ulcer on his left foot.  He did get the foot defender boot and is continue daily dressing changes.  Some occasional bleeding but no pus.  No swelling or redness.  Area is scabbed over.  Also has some of the nails be trimmed this with thickened elongated has difficulty trimming them causing discomfort.  No fevers or chills.  No other concerns.    Objective: AAO x3, NAD DP/PT pulses palpable bilaterally, CRT less than 3 seconds Nails are hypertrophic, dystrophic, brittle, discolored, elongated 10. No surrounding redness or drainage. Tenderness nails 1-5 bilaterally.  Ulcerations on the plantar medial aspect of the foot.  The picture below was after debridement of prior debridement almost completely covered with callus, scabbed.  After debrided the wound today slightly more narrow but measuring 0.5 x 0.3 x 0.1 cm.  There is no probing to bone or tunneling.  There is no surrounding erythema, ascending cellulitis.  No fluctuance or crepitation but there is no malodor. Preulcerative lesion noted on the right plantar medial aspect of the midfoot on the talar bulge plantarly. Significant flatfoot is present. No pain with calf compression, swelling, warmth, erythema    Assessment: Ulceration with resolving cellulitis; symptomatic onychomycosis  Plan: -All treatment options discussed with the patient including all alternatives, risks, complications.  -Medically necessary wound debridement is performed.  I sharply debrided the wound today with any complications, healthy, appearing tissue with #312 with scalpel.  Continue offloading daily dressing changes.  Dressing applied.  Continue daily dressing changes.  Monitor for any signs or symptoms of worsening infection or recurrence.  -Continue with silver  collagen. -Continue foot defender boot. -Sharply debrided nails x 10 without any complications or bleeding. -Patient encouraged to call the office with any questions, concerns, change in symptoms.   Trula Slade DPM

## 2022-04-24 NOTE — Progress Notes (Signed)
Virtual Visit via Telephone Note   Because of [name]'s co-morbid illnesses, he is at least at moderate risk for complications without adequate follow up.  This format is felt to be most appropriate for this patient at this time.  The patient did not have access to video technology/had technical difficulties with video requiring transitioning to audio format only (telephone).  All issues noted in this document were discussed and addressed.  No physical exam could be performed with this format.  Please refer to the patient's chart for his consent to telehealth for [facility].  Evaluation Performed:  Preoperative cardiovascular risk assessment _____________   Date:  04/24/2022   Patient ID:  [name], DOB [birth year], MRN [id number] Patient Location:  Home Provider location:   Office  Primary Care Provider:  Kathalene Frames, MD Primary Cardiologist:  Sinclair Grooms, MD  Chief Complaint / Patient Profile   67 y.o. y/o male with a h/o CAD s/p LHC 2008 with totally occluded RCA with left-to-right collaterals, low risk nuclear stress test 12/2020, OSA on CPAP, diabetes, morbid obesity, HLD, HTN who is pending endoscopy/colonoscopy and presents today for telephonic preoperative cardiovascular risk assessment.  History of Present Illness    [name] is a 67 y.o. male who presents via audio/video conferencing for a telehealth visit today.  Pt was last seen in cardiology clinic on 12/20/21 by Ambrose Pancoast, NP.  At that time [name] was doing well.  The patient is now pending procedure as outlined above. Since his last visit, he     Past Medical History    Past Medical History:  Diagnosis Date   Arthritis    "ankles, knees" (01/29/2018)   CAD in native artery    cath report from 02/2017 indicates patient had a history of RCA stent >10 years prior to that. At time of that cath he was found to have EF 70%, 20-30% ostial LAD, 30-40% prox LAD, 30-40% Cx,  80-90% Cx beyond OM1, totally occluded RCA with left-to-right collaterals.   Cellulitis 03/03/2022   Diabetes mellitus type 2 in obese (HCC)    GERD (gastroesophageal reflux disease)    Hyperlipidemia    Hypertension    Morbid obesity ([place])    Onychomycosis 03/03/2022   OSA on CPAP    Past Surgical History:  Procedure Laterality Date   CARDIAC CATHETERIZATION  2008   CORONARY ANGIOPLASTY WITH STENT PLACEMENT  1997   JOINT REPLACEMENT     KNEE ARTHROSCOPY Bilateral    "meniscus repairs"   TONSILLECTOMY     TOTAL KNEE ARTHROPLASTY Left 01/29/2018   TOTAL KNEE ARTHROPLASTY Left 01/29/2018   Procedure: LEFT TOTAL KNEE ARTHROPLASTY;  Surgeon: Leandrew Koyanagi, MD;  Location: [place];  Service: Orthopedics;  Laterality: Left;   UVULOPALATOPHARYNGOPLASTY  ~ 1994    Allergies  Allergies  Allergen Reactions   Metformin Other (See Comments)   Penicillins Rash    Has patient had a PCN reaction causing immediate rash, facial/tongue/throat swelling, SOB or lightheadedness with hypotension: Yes Has patient had a PCN reaction causing severe rash involving mucus membranes or skin necrosis: Unk Has patient had a PCN reaction that required hospitalization: Unk Has patient had a PCN reaction occurring within the last 10 years: No If all of the above answers are "NO", then may proceed with Cephalosporin use.     Home Medications    Prior to Admission medications   Medication Sig Start Date End Date Taking?  Authorizing Provider  acetaminophen (TYLENOL) 650 MG CR tablet Take 1,300 mg by mouth at bedtime.    [provider]  aspirin EC 325 MG tablet Take 325 mg by mouth at bedtime.    [provider]  atorvastatin (LIPITOR) 80 MG tablet Take 80 mg by mouth at bedtime.  02/09/13   [provider]  celecoxib (CELEBREX) 200 MG capsule TAKE 1 CAPSULE BY MOUTH EVERY DAY 04/04/22   Trula Slade, DPM  Cholecalciferol (VITAMIN D3) 2000 units TABS Take 2,000 Units by mouth  daily.    [provider]  empagliflozin (JARDIANCE) 25 MG TABS tablet Take 1 tablet (25 mg total) by mouth daily. 01/09/22   Belva Crome, MD  glimepiride (AMARYL) 2 MG tablet Take 2 mg by mouth daily. 12/03/20   [provider]  Glucosamine-Chondroitin (COSAMIN DS PO) Take 2 capsules by mouth daily with breakfast.    [provider]  isosorbide mononitrate (IMDUR) 120 MG 24 hr tablet TAKE 1 TABLET BY MOUTH EVERY DAY 01/08/22   Belva Crome, MD  Menthol, Topical Analgesic, 4 % GEL Apply 1 application topically See admin instructions. Apply to both feet at bedtime for neuropathy    [provider]  metoprolol succinate (TOPROL-XL) 100 MG 24 hr tablet TAKE 1 TABLET BY MOUTH DAILY. PLEASE CALL TO SCHEDULE AN OVERDUE APPOINTMENT WITH DR. Tamala Julian FOR REFILLS, [phone], THANK YOU. 3RD ATTEMPT. NO MORE REFILLS GIVEN 01/07/22   Belva Crome, MD  MOUNJARO 2.5 MG/0.5ML Pen SMARTSIG:2.5 Milligram(s) SUB-Q Once a Week 02/26/22   [provider]  Multiple Vitamin (MULTIVITAMIN) tablet Take 1 tablet by mouth daily.    [provider]  nitroGLYCERIN (NITROSTAT) 0.4 MG SL tablet Place 1 tablet (0.4 mg total) under the tongue every 5 (five) minutes x 3 doses as needed for chest pain. 12/07/20   Belva Crome, MD  omeprazole (PRILOSEC) 20 MG capsule Take 20 mg by mouth daily.  02/03/13   [provider]  pregabalin (LYRICA) 150 MG capsule Take 1 capsule (150 mg total) by mouth 2 (two) times daily. 04/16/22   Trula Slade, DPM  senna-docusate (SENOKOT S) 8.6-50 MG tablet Take 1 tablet by mouth at bedtime as needed. Patient taking differently: Take 2 tablets by mouth at bedtime. 01/29/18   Leandrew Koyanagi, MD  sulfamethoxazole-trimethoprim (BACTRIM DS) 800-160 MG tablet Take 1 tablet by mouth 2 (two) times daily. 04/02/22   Trula Slade, DPM  tamsulosin (FLOMAX) 0.4 MG CAPS capsule Take 0.4 mg by mouth at bedtime. 04/05/22   [provider]  terbinafine (LAMISIL) 250 MG tablet Take 1 tablet (250 mg total) by mouth daily. 03/03/22 06/01/22  Truman Hayward, MD  Tetrahydrozoline HCl (VISINE OP) Place 1 drop into both eyes daily as needed (for dry or irritated eyes).     [provider]  triamterene-hydrochlorothiazide (MAXZIDE) 75-50 MG per tablet Take 0.5 tablets by mouth daily.  01/06/13   [provider]  WELCHOL 625 MG tablet Take 1,875 mg by mouth at bedtime.  05/19/13   [provider]    Physical Exam    Vital Signs:  [name] does not have vital signs available for review today.  Given telephonic nature of communication, physical exam is limited. AAOx3. NAD. Normal affect.  Speech and respirations are unlabored.  Accessory Clinical Findings    None  Assessment & Plan    1.  Preoperative Cardiovascular Risk Assessment:  The patient  was advised that if he develops new symptoms prior to surgery to contact our office to arrange for a follow-up visit, and he verbalized understanding.  Aspirin may be held for 5-7 days preoperatively and should be resumed as soon as hemodynamically stable postoperatively.   A copy of this note will be routed to requesting surgeon.  Time:   Today, I have spent minutes with the patient with telehealth technology discussing medical history, symptoms, and management plan.     Multiple attempts to reach patient were unsuccessful.   Levi Aland, NP-C  04/25/2022, 11:02 AM [address] Office [phone] Fax [phone]  This encounter was created in error - please disregard.

## 2022-04-25 ENCOUNTER — Ambulatory Visit: Payer: Medicare PPO | Admitting: Nurse Practitioner

## 2022-04-25 DIAGNOSIS — Z01810 Encounter for preprocedural cardiovascular examination: Secondary | ICD-10-CM

## 2022-04-30 ENCOUNTER — Ambulatory Visit: Payer: Medicare PPO | Attending: Cardiovascular Disease | Admitting: Nurse Practitioner

## 2022-04-30 DIAGNOSIS — Z01810 Encounter for preprocedural cardiovascular examination: Secondary | ICD-10-CM

## 2022-04-30 NOTE — Progress Notes (Signed)
Virtual Visit via Telephone Note   Because of Graig Hessling [name]'s co-morbid illnesses, he is at least at moderate risk for complications without adequate follow up.  This format is felt to be most appropriate for this patient at this time.  The patient did not have access to video technology/had technical difficulties with video requiring transitioning to audio format only (telephone).  All issues noted in this document were discussed and addressed.  No physical exam could be performed with this format.  Please refer to the patient's chart for his consent to telehealth for [facility].  Evaluation Performed:  Preoperative cardiovascular risk assessment _____________   Date:  04/30/2022   Patient ID:  [name], DOB 09-30-1955, MRN [id number] Patient Location:  Home Provider location:   Office  Primary Care Provider:  Kathalene Frames, MD Primary Cardiologist:  Sinclair Grooms, MD  Chief Complaint / Patient Profile   67 y.o. y/o male with a h/o CAD s/p LHC with totally occluded RCA with left-to-right collaterals, DM, OSA (on CPAP), morbid obesity, HLD, HTN  who is pending endoscopy and colonoscopy and presents today for telephonic preoperative cardiovascular risk assessment.  History of Present Illness    [name] is a 67 y.o. male who presents via audio/video conferencing for a telehealth visit today.  Pt was last seen in cardiology clinic on 12/20/2021 by Ambrose Pancoast, NP.  At that time [name] was doing well with occasional episodes of angina that passed with rest..  The patient is now pending procedure as outlined above. Since his last visit, he has no further episodes of chest pain or angina.  He was recently started on Mounjaro and is experiencing bouts of dyspepsia and diarrhea.  He has not taken a dose of this medication since prior to Christmas.  He denies chest pain, shortness of breath, lower extremity edema, fatigue, palpitations, melena, hematuria,  hemoptysis, diaphoresis, weakness, presyncope, syncope, orthopnea, and PND.  Past Medical History    Past Medical History:  Diagnosis Date   Arthritis    "ankles, knees" (01/29/2018)   CAD in native artery    cath report from 02/2017 indicates patient had a history of RCA stent >10 years prior to that. At time of that cath he was found to have EF 70%, 20-30% ostial LAD, 30-40% prox LAD, 30-40% Cx, 80-90% Cx beyond OM1, totally occluded RCA with left-to-right collaterals.   Cellulitis 03/03/2022   Diabetes mellitus type 2 in obese (HCC)    GERD (gastroesophageal reflux disease)    Hyperlipidemia    Hypertension    Morbid obesity ([place])    Onychomycosis 03/03/2022   OSA on CPAP    Past Surgical History:  Procedure Laterality Date   CARDIAC CATHETERIZATION  2008   CORONARY ANGIOPLASTY WITH STENT PLACEMENT  1997   JOINT REPLACEMENT     KNEE ARTHROSCOPY Bilateral    "meniscus repairs"   TONSILLECTOMY     TOTAL KNEE ARTHROPLASTY Left 01/29/2018   TOTAL KNEE ARTHROPLASTY Left 01/29/2018   Procedure: LEFT TOTAL KNEE ARTHROPLASTY;  Surgeon: Leandrew Koyanagi, MD;  Location: [place];  Service: Orthopedics;  Laterality: Left;   UVULOPALATOPHARYNGOPLASTY  ~ 1994    Allergies  Allergies  Allergen Reactions   Metformin Other (See Comments)   Penicillins Rash    Has patient had a PCN reaction causing immediate rash, facial/tongue/throat swelling, SOB or lightheadedness with hypotension: Yes Has patient had a PCN reaction causing severe rash involving mucus membranes  or skin necrosis: Unk Has patient had a PCN reaction that required hospitalization: Unk Has patient had a PCN reaction occurring within the last 10 years: No If all of the above answers are "NO", then may proceed with Cephalosporin use.     Home Medications    Prior to Admission medications   Medication Sig Start Date End Date Taking? Authorizing Provider  acetaminophen (TYLENOL) 650 MG CR tablet Take 1,300 mg by mouth at  bedtime.    [provider]  aspirin EC 325 MG tablet Take 325 mg by mouth at bedtime.    [provider]  atorvastatin (LIPITOR) 80 MG tablet Take 80 mg by mouth at bedtime.  02/09/13   [provider]  celecoxib (CELEBREX) 200 MG capsule TAKE 1 CAPSULE BY MOUTH EVERY DAY 04/04/22   Trula Slade, DPM  Cholecalciferol (VITAMIN D3) 2000 units TABS Take 2,000 Units by mouth daily.    [provider]  empagliflozin (JARDIANCE) 25 MG TABS tablet Take 1 tablet (25 mg total) by mouth daily. 01/09/22   Belva Crome, MD  glimepiride (AMARYL) 2 MG tablet Take 2 mg by mouth daily. 12/03/20   [provider]  Glucosamine-Chondroitin (COSAMIN DS PO) Take 2 capsules by mouth daily with breakfast.    [provider]  isosorbide mononitrate (IMDUR) 120 MG 24 hr tablet TAKE 1 TABLET BY MOUTH EVERY DAY 01/08/22   Belva Crome, MD  Menthol, Topical Analgesic, 4 % GEL Apply 1 application topically See admin instructions. Apply to both feet at bedtime for neuropathy    [provider]  metoprolol succinate (TOPROL-XL) 100 MG 24 hr tablet TAKE 1 TABLET BY MOUTH DAILY. PLEASE CALL TO SCHEDULE AN OVERDUE APPOINTMENT WITH DR. Tamala Julian FOR REFILLS, [phone], THANK YOU. 3RD ATTEMPT. NO MORE REFILLS GIVEN 01/07/22   Belva Crome, MD  MOUNJARO 2.5 MG/0.5ML Pen SMARTSIG:2.5 Milligram(s) SUB-Q Once a Week 02/26/22   [provider]  Multiple Vitamin (MULTIVITAMIN) tablet Take 1 tablet by mouth daily.    [provider]  nitroGLYCERIN (NITROSTAT) 0.4 MG SL tablet Place 1 tablet (0.4 mg total) under the tongue every 5 (five) minutes x 3 doses as needed for chest pain. 12/07/20   Belva Crome, MD  omeprazole (PRILOSEC) 20 MG capsule Take 20 mg by mouth daily.  02/03/13   [provider]  pregabalin (LYRICA) 150 MG capsule Take 1 capsule (150 mg total) by mouth 2 (two) times daily. 04/16/22   Trula Slade, DPM  senna-docusate  (SENOKOT S) 8.6-50 MG tablet Take 1 tablet by mouth at bedtime as needed. Patient taking differently: Take 2 tablets by mouth at bedtime. 01/29/18   Leandrew Koyanagi, MD  sulfamethoxazole-trimethoprim (BACTRIM DS) 800-160 MG tablet Take 1 tablet by mouth 2 (two) times daily. 04/02/22   Trula Slade, DPM  tamsulosin (FLOMAX) 0.4 MG CAPS capsule Take 0.4 mg by mouth at bedtime. 04/05/22   [provider]  terbinafine (LAMISIL) 250 MG tablet Take 1 tablet (250 mg total) by mouth daily. 03/03/22 06/01/22  Truman Hayward, MD  Tetrahydrozoline HCl (VISINE OP) Place 1 drop into both eyes daily as needed (for dry or irritated eyes).     [provider]  triamterene-hydrochlorothiazide (MAXZIDE) 75-50 MG per tablet Take 0.5 tablets by mouth daily.  01/06/13   [provider]  WELCHOL 625 MG tablet Take 1,875 mg by mouth at bedtime.  05/19/13   [provider]    Physical  Exam    Vital Signs:  MONROE QIN does not have vital signs available for review today  Given telephonic nature of communication, physical exam is limited. AAOx3. NAD. Normal affect.  Speech and respirations are unlabored.  Accessory Clinical Findings    None  Assessment & Plan    1.  Preoperative Cardiovascular Risk Assessment:    Mr. Foots perioperative risk of a major cardiac event is 0.9% according to the Revised Cardiac Risk Index (RCRI).  Therefore, he is at low risk for perioperative complications.   His functional capacity is fair at 4.31 METs according to the Duke Activity Status Index (DASI). Recommendations: According to ACC/AHA guidelines, no further cardiovascular testing needed.  The patient may proceed to surgery at acceptable risk.   Antiplatelet and/or Anticoagulation Recommendations:  Regarding ASA therapy, we recommend continuation of ASA throughout the perioperative period.  However, if the surgeon feels that cessation of ASA is required in the perioperative  period, it may be stopped 5-7 days prior to surgery with a plan to resume it as soon as felt to be feasible from a surgical standpoint in the post-operative period.   The patient was advised that if he develops new symptoms prior to surgery to contact our office to arrange for a follow-up visit, and he verbalized understanding.    A copy of this note will be routed to requesting surgeon.  Time:   Today, I have spent 9 minutes with the patient with telehealth technology discussing medical history, symptoms, and management plan.     Napoleon Form, Leodis Rains, NP  04/30/2022, 8:02 AM

## 2022-05-02 ENCOUNTER — Ambulatory Visit: Payer: Medicare PPO | Admitting: Podiatry

## 2022-05-02 DIAGNOSIS — L97522 Non-pressure chronic ulcer of other part of left foot with fat layer exposed: Secondary | ICD-10-CM | POA: Diagnosis not present

## 2022-05-05 NOTE — Progress Notes (Signed)
Subjective: Chief Complaint  Patient presents with   Foot Ulcer    LEFT FOOT, SINCE OCTOBER, LOCATED ON THE BOTTOM OF FOOT, PRIOR TREATMENT INCLUDED CLEANING AND DRESSING THE WOUND      67 year old male presents the office today for evaluation of ulcer on his left foot.  He has not seen any significant drainage.  No swelling or redness.  States still been using the foot defender boot daily dressing changes.  No fevers or chills that he reports.  No other concerns.    Objective: AAO x3, NAD DP/PT pulses palpable bilaterally, CRT less than 3 seconds Ulcerations on the plantar medial aspect of the foot.  The picture below was after debridement of prior debridement almost completely covered with callus, scabbed.  After debrided the wound today s measures 0.3 x 0.3 x 0.1 cm.  Prior debridement the wound was smaller and difficult to measure given is almost completely covered with callus.  There is no probing to bone or tunneling.  There is no surrounding erythema, ascending cellulitis.  No fluctuance or crepitation but there is no malodor. Significant flatfoot is present. No pain with calf compression, swelling, warmth, erythema    Assessment: Ulceration with resolving cellulitis; symptomatic onychomycosis  Plan: -All treatment options discussed with the patient including all alternatives, risks, complications.  -Medically necessary wound debridement is performed.  I sharply debrided the wound today with any complications, healthy, appearing tissue with #312 with scalpel.  Continue offloading daily dressing changes.  Dressing applied.  Continue daily dressing changes.  Monitor for any signs or symptoms of worsening infection or recurrence.  -Continue with silver collagen. -Continue foot defender boot. -Monitor for any clinical signs or symptoms of infection and directed to call the office immediately should any occur or go to the ER. -Patient encouraged to call the office with any questions,  concerns, change in symptoms.   Trula Slade DPM

## 2022-05-06 ENCOUNTER — Ambulatory Visit: Payer: Medicare PPO | Admitting: Podiatry

## 2022-05-25 ENCOUNTER — Other Ambulatory Visit: Payer: Self-pay | Admitting: Infectious Disease

## 2022-05-26 ENCOUNTER — Ambulatory Visit: Payer: Medicare PPO | Admitting: Podiatry

## 2022-05-26 DIAGNOSIS — B351 Tinea unguium: Secondary | ICD-10-CM

## 2022-05-26 DIAGNOSIS — Z79899 Other long term (current) drug therapy: Secondary | ICD-10-CM | POA: Diagnosis not present

## 2022-05-26 DIAGNOSIS — L97522 Non-pressure chronic ulcer of other part of left foot with fat layer exposed: Secondary | ICD-10-CM

## 2022-05-26 NOTE — Patient Instructions (Signed)
Wash with soap and water daily, dry well. Apply a small amount of iodosorb followed by a dressing.  Monitor for any signs/symptoms of infection. Call the office immediately if any occur or go directly to the emergency room. Call with any questions/concerns.

## 2022-05-27 LAB — CBC WITH DIFFERENTIAL/PLATELET
Basophils Absolute: 0.1 10*3/uL (ref 0.0–0.2)
Basos: 1 %
EOS (ABSOLUTE): 1.3 10*3/uL — ABNORMAL HIGH (ref 0.0–0.4)
Eos: 20 %
Hematocrit: 48.8 % (ref 37.5–51.0)
Hemoglobin: 15.9 g/dL (ref 13.0–17.7)
Immature Grans (Abs): 0 10*3/uL (ref 0.0–0.1)
Immature Granulocytes: 0 %
Lymphocytes Absolute: 1.6 10*3/uL (ref 0.7–3.1)
Lymphs: 25 %
MCH: 29.3 pg (ref 26.6–33.0)
MCHC: 32.6 g/dL (ref 31.5–35.7)
MCV: 90 fL (ref 79–97)
Monocytes Absolute: 0.4 10*3/uL (ref 0.1–0.9)
Monocytes: 6 %
Neutrophils Absolute: 3 10*3/uL (ref 1.4–7.0)
Neutrophils: 48 %
Platelets: 159 10*3/uL (ref 150–450)
RBC: 5.43 x10E6/uL (ref 4.14–5.80)
RDW: 15.9 % — ABNORMAL HIGH (ref 11.6–15.4)
WBC: 6.4 10*3/uL (ref 3.4–10.8)

## 2022-05-27 LAB — HEPATIC FUNCTION PANEL
ALT: 16 IU/L (ref 0–44)
AST: 18 IU/L (ref 0–40)
Albumin: 4 g/dL (ref 3.9–4.9)
Alkaline Phosphatase: 87 IU/L (ref 44–121)
Bilirubin Total: 0.4 mg/dL (ref 0.0–1.2)
Bilirubin, Direct: 0.14 mg/dL (ref 0.00–0.40)
Total Protein: 6.6 g/dL (ref 6.0–8.5)

## 2022-05-28 ENCOUNTER — Other Ambulatory Visit: Payer: Self-pay | Admitting: Podiatry

## 2022-05-28 MED ORDER — TERBINAFINE HCL 250 MG PO TABS: 250.0000 mg | ORAL_TABLET | Freq: Every day | ORAL | 0 refills | Status: DC

## 2022-05-28 NOTE — Progress Notes (Signed)
Subjective: Chief Complaint  Patient presents with   Foot Ulcer    Left foot, bottom of foot near ball of foot, patient stated he is doing well, a little blood when pressure is applied due to walking      67 year old male presents the office today for evaluation of ulcer on his left foot.  He walked without the boot for short distance and he does have some amount of bleeding.  But no pus.  No increase in swelling or redness.  Denies any fevers or chills.  He has missed a course of Lamisil and it was helpful for his small continuing this.  No side effects of the medication he reports.    Objective: AAO x3, NAD DP/PT pulses palpable bilaterally, CRT less than 3 seconds Ulcerations on the plantar medial aspect of the foot.  Did the wound measures slightly larger at 0.4 x 0.4 x 0.2 cm.  Has a granular wound base and there is no surrounding erythema, ascending cellulitis.  No fluctuance or crepitation there is no.  Prior to debridement the wound was smaller as it was mostly covered with callus and measured  0.2 x 0.2 x 0.1 cm.  There is no probing to bone, undermining or tunneling.  There is no fluctuation or crepitation there is no malodor. There is clearing on the proximal nail fold of the nail from the nail so remain hypertrophic, dystrophic with yellow, brown discoloration.  There is no edema, erythema or signs of infection. No pain with calf compression, swelling, warmth, erythema   Assessment: Ulceration without signs of cellulitis; onychomycosis  Plan: -All treatment options discussed with the patient including all alternatives, risks, complications.  -Medically necessary wound debridement is performed.  I sharply debrided the wound today with any complications, healthy, appearing tissue with #312 with scalpel.  Pre and post wound measurements are noted above.  No significant blood loss noted.  Will switch to Iodosorb dressing changes.  Continue offloading. -Will recheck a CBC and LFT  prior to refilling Lamisil  Trula Slade DPM

## 2022-06-13 DIAGNOSIS — I25119 Atherosclerotic heart disease of native coronary artery with unspecified angina pectoris: Secondary | ICD-10-CM | POA: Diagnosis not present

## 2022-06-13 DIAGNOSIS — E11621 Type 2 diabetes mellitus with foot ulcer: Secondary | ICD-10-CM | POA: Diagnosis not present

## 2022-06-13 DIAGNOSIS — G4733 Obstructive sleep apnea (adult) (pediatric): Secondary | ICD-10-CM | POA: Diagnosis not present

## 2022-06-13 DIAGNOSIS — E1142 Type 2 diabetes mellitus with diabetic polyneuropathy: Secondary | ICD-10-CM | POA: Diagnosis not present

## 2022-06-13 DIAGNOSIS — E1121 Type 2 diabetes mellitus with diabetic nephropathy: Secondary | ICD-10-CM | POA: Diagnosis not present

## 2022-06-13 DIAGNOSIS — I10 Essential (primary) hypertension: Secondary | ICD-10-CM | POA: Diagnosis not present

## 2022-06-13 DIAGNOSIS — L97509 Non-pressure chronic ulcer of other part of unspecified foot with unspecified severity: Secondary | ICD-10-CM | POA: Diagnosis not present

## 2022-06-16 ENCOUNTER — Ambulatory Visit: Payer: Medicare PPO | Admitting: Podiatry

## 2022-06-16 DIAGNOSIS — B351 Tinea unguium: Secondary | ICD-10-CM | POA: Diagnosis not present

## 2022-06-16 DIAGNOSIS — L84 Corns and callosities: Secondary | ICD-10-CM | POA: Diagnosis not present

## 2022-06-16 DIAGNOSIS — L97522 Non-pressure chronic ulcer of other part of left foot with fat layer exposed: Secondary | ICD-10-CM | POA: Diagnosis not present

## 2022-06-16 DIAGNOSIS — M79675 Pain in left toe(s): Secondary | ICD-10-CM | POA: Diagnosis not present

## 2022-06-16 DIAGNOSIS — E1140 Type 2 diabetes mellitus with diabetic neuropathy, unspecified: Secondary | ICD-10-CM

## 2022-06-16 DIAGNOSIS — M79674 Pain in right toe(s): Secondary | ICD-10-CM | POA: Diagnosis not present

## 2022-06-16 NOTE — Progress Notes (Unsigned)
Subjective: Chief Complaint  Patient presents with   Foot Ulcer    Follow up, left foot, patient stated ulcer is getting better , no drainage    67 year old male presents the office today for evaluation of ulcer on his left foot.  He feels that is getting smaller has not been seen any bleeding.  Increasing swelling or redness.  He denies any fevers or chills.   Also asking the nails to be trimmed today as they are thickened elongated has difficulty trimming them himself may cause discomfort.  Objective: AAO x3, NAD DP/PT pulses palpable bilaterally, CRT less than 3 seconds Ulcerations on the plantar medial aspect of the foot.  Prior to debridement there is callus overlying the area.  After debrided the wound appears to be almost healed measures 0.2 x 0.2 x 0.1 cm without any probing, undermining or tunneling.  There is no fluctuance or crepitation.  No malodor. Preulcerative callus noted on the right foot along the talar prominence.  There is no definitive ulceration there is no edema, erythema or signs of infection. Nails are hypertrophic, dystrophic, brittle, discolored, elongated 10. No surrounding redness or drainage. Tenderness nails 1-5 bilaterally.  No other open lesions. No pain with calf compression, swelling, warmth, erythema   Assessment: Ulceration without signs of cellulitis; symptomatic onychomycosis  Plan: -All treatment options discussed with the patient including all alternatives, risks, complications.  -Medically necessary wound debridement is performed.  I sharply debrided the wound today with any complications, healthy, appearing tissue with #312 with scalpel.  Pre and post wound measurements are noted above.  No significant blood loss noted.  Will continue with Iodosorb dressing changes.  Continue offloading. -Sharply debrided the callus on the right foot without any complications or bleeding. -Sharply debrided nails x 10 without any complications or  bleeding.  Trula Slade DPM

## 2022-06-17 ENCOUNTER — Encounter: Payer: Self-pay | Admitting: *Deleted

## 2022-06-17 DIAGNOSIS — M2560 Stiffness of unspecified joint, not elsewhere classified: Secondary | ICD-10-CM | POA: Diagnosis not present

## 2022-06-17 DIAGNOSIS — M5459 Other low back pain: Secondary | ICD-10-CM | POA: Diagnosis not present

## 2022-06-17 DIAGNOSIS — M6281 Muscle weakness (generalized): Secondary | ICD-10-CM | POA: Diagnosis not present

## 2022-06-17 NOTE — Congregational Nurse Program (Signed)
Spoke with patient at [employer].  Follow up on foot ulcer.  Still has a small unhealed area but is wearing an orthopedic boot as directed by his md. States that area is now as large as his fingernail and does drain a small amount of non purlent material.  [facility].

## 2022-06-26 DIAGNOSIS — E1142 Type 2 diabetes mellitus with diabetic polyneuropathy: Secondary | ICD-10-CM | POA: Diagnosis not present

## 2022-06-26 DIAGNOSIS — G4733 Obstructive sleep apnea (adult) (pediatric): Secondary | ICD-10-CM | POA: Diagnosis not present

## 2022-06-26 DIAGNOSIS — Z6837 Body mass index (BMI) 37.0-37.9, adult: Secondary | ICD-10-CM | POA: Diagnosis not present

## 2022-06-30 DIAGNOSIS — G4733 Obstructive sleep apnea (adult) (pediatric): Secondary | ICD-10-CM | POA: Diagnosis not present

## 2022-07-01 DIAGNOSIS — M6281 Muscle weakness (generalized): Secondary | ICD-10-CM | POA: Diagnosis not present

## 2022-07-01 DIAGNOSIS — M5459 Other low back pain: Secondary | ICD-10-CM | POA: Diagnosis not present

## 2022-07-01 DIAGNOSIS — M2560 Stiffness of unspecified joint, not elsewhere classified: Secondary | ICD-10-CM | POA: Diagnosis not present

## 2022-07-07 ENCOUNTER — Ambulatory Visit: Payer: Medicare PPO | Admitting: Podiatry

## 2022-07-07 DIAGNOSIS — L97422 Non-pressure chronic ulcer of left heel and midfoot with fat layer exposed: Secondary | ICD-10-CM | POA: Diagnosis not present

## 2022-07-07 DIAGNOSIS — E11621 Type 2 diabetes mellitus with foot ulcer: Secondary | ICD-10-CM | POA: Diagnosis not present

## 2022-07-07 MED ORDER — DOXYCYCLINE HYCLATE 100 MG PO TABS: 100.0000 mg | ORAL_TABLET | Freq: Two times a day (BID) | ORAL | 0 refills | Status: DC

## 2022-07-09 NOTE — Progress Notes (Signed)
Subjective: Chief Complaint  Patient presents with   Foot Ulcer    Patient has a new ulcer next to the one being treated, patient has minimal drainage, combination of clear and bloody     67 year old male presents the office today for evaluation of ulcer on his left foot.  He states he was doing well he noticed a blister in the wound formed just adjacent to the previous wound.  He does not have any pus but occasional bleeding.  No increase in swelling or redness.  No fevers or chills.  No other concerns.    Objective: AAO x3, NAD DP/PT pulses palpable bilaterally, CRT less than 3 seconds Ulcerations on the plantar medial aspect of the foot.  Prior to debridement is covered mostly with blister, callus formation.  After debridement this today the wound is larger measuring 1.5 x 1 x 0.1 cm.  It appears a blister formed around the callus which makes the wound larger.  There is no ascending erythema, ascending cellulitis.  No fluctuance or crepitation there is no malodor.  Minimal swelling. Significant flatfoot is present. No pain with calf compression, swelling, warmth, erythema  Assessment: Ulceration left foot  Plan: -All treatment options discussed with the patient including all alternatives, risks, complications.  -Medically necessary wound debridement is performed.  I sharply debrided the wound today with any complications, healthy, appearing tissue with #312 with scalpel.  Pre and post wound measurements are noted above.  No significant blood loss noted.  Aquacel was applied to the wound followed by dressing.  Continue daily dressing changes. -Doxycycline given increased size of wound. -I do think he benefit from total contact casting to help further offload to help facilitate soft tissue healing.  Will plan on doing this next appointment. -Daily foot inspection.  No follow-ups on file.  Trula Slade DPM

## 2022-07-14 ENCOUNTER — Ambulatory Visit: Payer: Medicare PPO | Admitting: Podiatry

## 2022-07-14 DIAGNOSIS — L97422 Non-pressure chronic ulcer of left heel and midfoot with fat layer exposed: Secondary | ICD-10-CM | POA: Diagnosis not present

## 2022-07-14 DIAGNOSIS — E11621 Type 2 diabetes mellitus with foot ulcer: Secondary | ICD-10-CM | POA: Diagnosis not present

## 2022-07-14 MED ORDER — PREGABALIN 150 MG PO CAPS: 150.0000 mg | ORAL_CAPSULE | Freq: Two times a day (BID) | ORAL | 0 refills | Status: DC

## 2022-07-15 DIAGNOSIS — M2560 Stiffness of unspecified joint, not elsewhere classified: Secondary | ICD-10-CM | POA: Diagnosis not present

## 2022-07-15 DIAGNOSIS — M5459 Other low back pain: Secondary | ICD-10-CM | POA: Diagnosis not present

## 2022-07-15 DIAGNOSIS — M6281 Muscle weakness (generalized): Secondary | ICD-10-CM | POA: Diagnosis not present

## 2022-07-18 DIAGNOSIS — G4733 Obstructive sleep apnea (adult) (pediatric): Secondary | ICD-10-CM | POA: Diagnosis not present

## 2022-07-19 NOTE — Progress Notes (Signed)
Subjective: Chief Complaint  Patient presents with   Follow-up    PATIENT RECEIVING CAST TODAY ON LEFT FOOT     67 year old male presents the office today for evaluation of ulcer on his left foot.  Presents today for total contact cast.  Denies any fevers or chills.  States the ulcer is doing much better.  He does not report any fevers or chills.  Objective: AAO x3, NAD DP/PT pulses palpable bilaterally, CRT less than 3 seconds Ulcerations on the plantar medial aspect of the foot.  There is hyperkeratotic tissue with dried blood present upon debridement the wound is much smaller today and appears of almost healed with superficial area skin breakdown.  There is no surrounding erythema, ascending cellulitis.  There is no fluctuation or crepitation.  There is no malodor. No pain with calf compression, swelling, warmth, erythema  Assessment: Ulceration left foot  Plan: -All treatment options discussed with the patient including all alternatives, risks, complications.  -Medically necessary wound debridement is performed.  I sharply debrided the wound today with any complications, healthy, appearing tissue with #312 with scalpel.  Wound is much smaller today.  No significant blood loss noted.  Aquacel was applied to the wound followed by dressing.  Continue daily dressing changes. -Given significant improvement in the wound we held off on total contact casting.  He needs to remain in foot defender boot, offloading and limited weightbearing.  Continue with daily dressing changes. -Monitor for any clinical signs or symptoms of infection and directed to call the office immediately should any occur or go to the ER.  Return in about 1 week (around 07/21/2022) for ulcer.  [name]

## 2022-07-21 ENCOUNTER — Ambulatory Visit: Payer: Medicare PPO | Admitting: Podiatry

## 2022-07-21 DIAGNOSIS — L97521 Non-pressure chronic ulcer of other part of left foot limited to breakdown of skin: Secondary | ICD-10-CM

## 2022-07-21 DIAGNOSIS — L84 Corns and callosities: Secondary | ICD-10-CM

## 2022-07-21 NOTE — Progress Notes (Signed)
Subjective: Chief Complaint  Patient presents with   Diabetic Ulcer    Pt stated that he feels like it is doing better     67 year old male presents the office today for evaluation of ulcer on his left foot.  States he is doing much better.  Denies any drainage or pus or any bleeding.  Calluses formed on the right foot in same area.  No drainage or pus.  No fevers chills.  No other concerns.   Objective: AAO x3, NAD DP/PT pulses palpable bilaterally, CRT less than 3 seconds Preulcerative lesion noted on the right foot along medial aspect and upon debridement no ongoing ulceration drainage or signs of infection.  The left foot callus with some dried blood present as able to debride some of this today without any complications or bleeding.  Appears the wound is healing well without any signs of infection.  No open sore identified today. No pain with calf compression, swelling, warmth, erythema       Assessment: Ulceration left foot  Plan: -All treatment options discussed with the patient including all alternatives, risks, complications.  -Debrided lesions bilaterally without any complications or bleeding.  Continue with silver alginate dressings. -Monitor for any clinical signs or symptoms of infection and directed to call the office immediately should any occur or go to the ER.  Return in about 2 weeks (around 08/04/2022) for foot ulcer.  [name]

## 2022-07-22 DIAGNOSIS — Z6838 Body mass index (BMI) 38.0-38.9, adult: Secondary | ICD-10-CM | POA: Diagnosis not present

## 2022-07-22 DIAGNOSIS — E1165 Type 2 diabetes mellitus with hyperglycemia: Secondary | ICD-10-CM | POA: Diagnosis not present

## 2022-07-22 DIAGNOSIS — G4733 Obstructive sleep apnea (adult) (pediatric): Secondary | ICD-10-CM | POA: Diagnosis not present

## 2022-07-23 ENCOUNTER — Other Ambulatory Visit: Payer: Self-pay | Admitting: Podiatry

## 2022-08-03 ENCOUNTER — Other Ambulatory Visit: Payer: Self-pay | Admitting: Podiatry

## 2022-08-04 ENCOUNTER — Ambulatory Visit: Payer: Medicare PPO | Admitting: Podiatry

## 2022-08-04 DIAGNOSIS — E1140 Type 2 diabetes mellitus with diabetic neuropathy, unspecified: Secondary | ICD-10-CM

## 2022-08-04 DIAGNOSIS — L84 Corns and callosities: Secondary | ICD-10-CM

## 2022-08-04 DIAGNOSIS — M2142 Flat foot [pes planus] (acquired), left foot: Secondary | ICD-10-CM

## 2022-08-04 DIAGNOSIS — M2141 Flat foot [pes planus] (acquired), right foot: Secondary | ICD-10-CM | POA: Diagnosis not present

## 2022-08-05 NOTE — Progress Notes (Signed)
Subjective: Chief Complaint  Patient presents with   Foot Ulcer    2 week follow up left foot    68 year old male presents the office today for evaluation of ulcer on his left foot.  States that he has been doing well.  Does not see any drainage or bleeding.  No swelling or redness.  He has been keeping a silver dressing on the left foot.  Denies any fevers or chills.  Objective: AAO x3, NAD DP/PT pulses palpable bilaterally, CRT less than 3 seconds Preulcerative lesion noted on the left foot with hyperkeratotic tissue present dried blood present.  Upon debridement is doing better there is no open lesion and there is no drainage or pus.  No edema, erythema.  On the left foot on the plantar medial aspect there is a preulcerative lesion as well with callus formation.  Upon debridement again there is no underlying ulceration but he is to monitor this closely.  There is no edema, erythema bilaterally.  There is no drainage or pus.  There is no fluctuation or crepitation bilaterally. No pain with calf compression, swelling, warmth, erythema   Assessment: Preulcerative lesions bilaterally, flatfoot  Plan: -All treatment options discussed with the patient including all alternatives, risks, complications.  -Debrided lesions bilaterally without any complications or bleeding.  Continue with silver alginate dressings. -He is to continue with the foot defender boot, offloading. -We did discuss surgical intervention to help correct his foot deformity.  He is to continue with conservative care for now but if continues to get reoccurrence of skin breakdown or infections I would recommend consider surgical intervention. -Monitor for any clinical signs or symptoms of infection and directed to call the office immediately should any occur or go to the ER.  Return in about 2 weeks (around 08/18/2022) for ulcer nail trim .  [name]

## 2022-08-11 ENCOUNTER — Ambulatory Visit (INDEPENDENT_AMBULATORY_CARE_PROVIDER_SITE_OTHER): Payer: Medicare PPO

## 2022-08-11 ENCOUNTER — Ambulatory Visit: Payer: Medicare PPO | Admitting: Podiatry

## 2022-08-11 DIAGNOSIS — B351 Tinea unguium: Secondary | ICD-10-CM | POA: Diagnosis not present

## 2022-08-11 DIAGNOSIS — L97412 Non-pressure chronic ulcer of right heel and midfoot with fat layer exposed: Secondary | ICD-10-CM

## 2022-08-11 DIAGNOSIS — M2142 Flat foot [pes planus] (acquired), left foot: Secondary | ICD-10-CM

## 2022-08-11 DIAGNOSIS — M2141 Flat foot [pes planus] (acquired), right foot: Secondary | ICD-10-CM

## 2022-08-11 DIAGNOSIS — M79674 Pain in right toe(s): Secondary | ICD-10-CM | POA: Diagnosis not present

## 2022-08-11 DIAGNOSIS — M79675 Pain in left toe(s): Secondary | ICD-10-CM | POA: Diagnosis not present

## 2022-08-12 NOTE — Progress Notes (Signed)
Subjective: Chief Complaint  Patient presents with   Diabetic Ulcer    diabetic with right foot sore has opened up    67 year old male presents the office today for an acute appointment given concerns for the right foot opening up.  He noticed on Friday he had some drainage coming from the area but no pus.  There is since stopped draining as much.  No increase swelling or redness.  He has been a Lobbyist on the right foot.  Left side is doing well from an ulcer standpoint but his nails are thickened elongated and are rubbing causing irritation of the second digit nail on the left is starting to lift up.   Objective: AAO x3, NAD DP/PT pulses palpable bilaterally, CRT less than 3 seconds Left foot: There is ulceration has healed however there is small minimal scabbing present without any edema, erythema or signs of infection. Right foot: On the plantar aspect of the midfoot medially there is hyperkeratotic lesion.  The area is preulcerative last time and there is callus formation which I debrided today and there is 1 small superficial granular wound present measuring 0.3 x 0.3 x 0.1 cm.  Prior to debridement not able to measure the ulcer as it was covered with callus.  There is no probing, undermining or tunneling.  There is no surrounding erythema, ascending cellulitis.  There is no fluctuance or crepitation.  There is no malodor.  No signs of infection today. No drainage.  Nails are hypertrophic, dystrophic, brittle, discolored, elongated 10. No surrounding redness or drainage. Tenderness nails 1-5 bilaterally. No open lesions or pre-ulcerative lesions are identified today. No pain with calf compression, swelling, warmth, erythema   Assessment: Right foot ulceration, symptomatic onychomycosis  Plan: -All treatment options discussed with the patient including all alternatives, risks, complications.  -X-rays were obtained reviewed of the right foot.  3 views were obtained.   Significant arthritic changes present.  Flatfoot is present.  There is no cortical changes suggest osteomyelitis at this time. -Medically necessary wound debridement was performed the right foot.  I utilized #161 with scalpel sharp debridement removed nonviable, devitalized tissue to promote wound healing.  I debrided the wound down to healthy, granular tissue.  Not able to measure the wound prior to debridement as it was covered with callus but post wound measurements are noted above.  Silvadene was applied today followed by dressing. Continue with daily dressing changes with silver dressing that he has already.  Continue foot defender boot offloading.  No signs of infection states adequate oral antibiotics. Monitor for any clinical signs or symptoms of infection and directed to call the office immediately should any occur or go to the ER. -Sharp debrided nails x 10 without any complications or bleeding. -Continue offloading for left foot and monitor for any reoccurrence. -We have previously discussed surgical intervention for his flatfoot, arthritis.  If he continues to get reoccurrence of the wounds or infections I do think we should reconsider this.  Follow-up as scheduled or sooner if needed.   [name]

## 2022-08-17 DIAGNOSIS — G4733 Obstructive sleep apnea (adult) (pediatric): Secondary | ICD-10-CM | POA: Diagnosis not present

## 2022-08-21 DIAGNOSIS — G4733 Obstructive sleep apnea (adult) (pediatric): Secondary | ICD-10-CM | POA: Diagnosis not present

## 2022-08-21 DIAGNOSIS — Z6837 Body mass index (BMI) 37.0-37.9, adult: Secondary | ICD-10-CM | POA: Diagnosis not present

## 2022-08-21 DIAGNOSIS — E1165 Type 2 diabetes mellitus with hyperglycemia: Secondary | ICD-10-CM | POA: Diagnosis not present

## 2022-08-22 ENCOUNTER — Ambulatory Visit: Payer: Medicare PPO | Admitting: Podiatry

## 2022-08-22 DIAGNOSIS — M2142 Flat foot [pes planus] (acquired), left foot: Secondary | ICD-10-CM | POA: Diagnosis not present

## 2022-08-22 DIAGNOSIS — L97412 Non-pressure chronic ulcer of right heel and midfoot with fat layer exposed: Secondary | ICD-10-CM

## 2022-08-22 DIAGNOSIS — E1140 Type 2 diabetes mellitus with diabetic neuropathy, unspecified: Secondary | ICD-10-CM

## 2022-08-22 DIAGNOSIS — M2141 Flat foot [pes planus] (acquired), right foot: Secondary | ICD-10-CM | POA: Diagnosis not present

## 2022-08-22 DIAGNOSIS — S90426A Blister (nonthermal), unspecified lesser toe(s), initial encounter: Secondary | ICD-10-CM

## 2022-08-22 NOTE — Progress Notes (Signed)
Subjective: Chief Complaint  Patient presents with   Foot Ulcer    Follow up right foot   Nail Problem    Nail trim today    67 year old male presents the office today for follow-up evaluation for ulcerations on both feet.  He states they are doing better but he had to have the left second toe checked as well.  He does not report any drainage or pus.  No fevers or chills.  No increase in swelling or redness.  No other concerns.  Objective: AAO x3, NAD DP/PT pulses palpable bilaterally, CRT less than 3 seconds Left foot: There is minimal hyperkeratotic tissue on the plantar medial aspect of the midfoot.  There is no underlying ulceration drainage or any signs of infection.  This is much improved.  At the distal aspect left second toe is what appears to be an old blister on the inside debrided this is a superficial area skin breakdown without any probing to bone. There is no significant edema, erythema, ascending cellulitis.  No cultures were crepitation there is no malodor. Right foot: Hyperkeratotic lesion dried blood on the plantar medial aspect of the foot in a similar area at the left.  There is still 1 superficial area skin breakdown granulation tissue without any probing primary tunneling.  There is hyperkeratotic periwound.  There is no surrounding erythema, ascending cellulitis.  There is no fluctuance or crepitation there is no malodor.  Mild incurvation of the right lateral nail border. Significant flatfoot present. No pain with calf compression, swelling, warmth, erythema  Assessment: Right foot ulceration, left second toe ulceration  Plan: -All treatment options discussed with the patient including all alternatives, risks, complications.  -On the right fascia with greater hyperkeratotic tissue to reveal 1 superficial.  Granulation tissue.  Did not continue with daily dressing changes.  Is no signs of infection and continue to monitor. -On the left third toe I sharply debrided the  blister of the distal aspect left second toe agai to reveal a superficial area of skin breakdown.  Continue antibiotic ointment dressing changes daily.  Offloading. -Debrided right hallux nail without any complications or bleeding. -Monitor for any clinical signs or symptoms of infection and directed to call the office immediately should any occur or go to the ER.  Return in about 2 weeks (around 09/05/2022) for ulcer.  [name]

## 2022-09-04 ENCOUNTER — Ambulatory Visit: Payer: Medicare PPO | Admitting: Podiatry

## 2022-09-04 DIAGNOSIS — L97412 Non-pressure chronic ulcer of right heel and midfoot with fat layer exposed: Secondary | ICD-10-CM | POA: Diagnosis not present

## 2022-09-04 DIAGNOSIS — S90426A Blister (nonthermal), unspecified lesser toe(s), initial encounter: Secondary | ICD-10-CM

## 2022-09-04 MED ORDER — SULFAMETHOXAZOLE-TRIMETHOPRIM 800-160 MG PO TABS: 1.0000 | ORAL_TABLET | Freq: Two times a day (BID) | ORAL | 0 refills | Status: DC

## 2022-09-05 ENCOUNTER — Emergency Department (HOSPITAL_BASED_OUTPATIENT_CLINIC_OR_DEPARTMENT_OTHER): Payer: Medicare PPO

## 2022-09-05 ENCOUNTER — Other Ambulatory Visit (HOSPITAL_BASED_OUTPATIENT_CLINIC_OR_DEPARTMENT_OTHER): Payer: Self-pay

## 2022-09-05 ENCOUNTER — Encounter (HOSPITAL_BASED_OUTPATIENT_CLINIC_OR_DEPARTMENT_OTHER): Payer: Self-pay | Admitting: Emergency Medicine

## 2022-09-05 ENCOUNTER — Inpatient Hospital Stay (HOSPITAL_BASED_OUTPATIENT_CLINIC_OR_DEPARTMENT_OTHER)
Admission: EM | Admit: 2022-09-05 | Discharge: 2022-09-13 | DRG: 853 | Disposition: A | Discharge status: Home / Self Care | Payer: Medicare PPO | Attending: Student | Admitting: Student

## 2022-09-05 ENCOUNTER — Telehealth: Payer: Self-pay | Admitting: Podiatry

## 2022-09-05 ENCOUNTER — Other Ambulatory Visit: Payer: Self-pay

## 2022-09-05 DIAGNOSIS — E11621 Type 2 diabetes mellitus with foot ulcer: Secondary | ICD-10-CM | POA: Diagnosis present

## 2022-09-05 DIAGNOSIS — R7989 Other specified abnormal findings of blood chemistry: Secondary | ICD-10-CM | POA: Diagnosis not present

## 2022-09-05 DIAGNOSIS — E1169 Type 2 diabetes mellitus with other specified complication: Secondary | ICD-10-CM | POA: Diagnosis not present

## 2022-09-05 DIAGNOSIS — N5082 Scrotal pain: Secondary | ICD-10-CM | POA: Diagnosis not present

## 2022-09-05 DIAGNOSIS — I25708 Atherosclerosis of coronary artery bypass graft(s), unspecified, with other forms of angina pectoris: Secondary | ICD-10-CM | POA: Diagnosis not present

## 2022-09-05 DIAGNOSIS — M869 Osteomyelitis, unspecified: Secondary | ICD-10-CM | POA: Diagnosis not present

## 2022-09-05 DIAGNOSIS — Z79899 Other long term (current) drug therapy: Secondary | ICD-10-CM

## 2022-09-05 DIAGNOSIS — R0902 Hypoxemia: Secondary | ICD-10-CM

## 2022-09-05 DIAGNOSIS — G35 Multiple sclerosis: Secondary | ICD-10-CM | POA: Diagnosis not present

## 2022-09-05 DIAGNOSIS — I700 Atherosclerosis of aorta: Secondary | ICD-10-CM | POA: Diagnosis present

## 2022-09-05 DIAGNOSIS — I21A1 Myocardial infarction type 2: Secondary | ICD-10-CM | POA: Diagnosis not present

## 2022-09-05 DIAGNOSIS — E13621 Other specified diabetes mellitus with foot ulcer: Secondary | ICD-10-CM | POA: Diagnosis not present

## 2022-09-05 DIAGNOSIS — J9811 Atelectasis: Secondary | ICD-10-CM | POA: Diagnosis not present

## 2022-09-05 DIAGNOSIS — E1142 Type 2 diabetes mellitus with diabetic polyneuropathy: Secondary | ICD-10-CM | POA: Diagnosis present

## 2022-09-05 DIAGNOSIS — E11610 Type 2 diabetes mellitus with diabetic neuropathic arthropathy: Secondary | ICD-10-CM | POA: Diagnosis not present

## 2022-09-05 DIAGNOSIS — I2510 Atherosclerotic heart disease of native coronary artery without angina pectoris: Secondary | ICD-10-CM | POA: Diagnosis not present

## 2022-09-05 DIAGNOSIS — I517 Cardiomegaly: Secondary | ICD-10-CM | POA: Diagnosis present

## 2022-09-05 DIAGNOSIS — J189 Pneumonia, unspecified organism: Secondary | ICD-10-CM | POA: Diagnosis not present

## 2022-09-05 DIAGNOSIS — D696 Thrombocytopenia, unspecified: Secondary | ICD-10-CM | POA: Diagnosis not present

## 2022-09-05 DIAGNOSIS — E11628 Type 2 diabetes mellitus with other skin complications: Secondary | ICD-10-CM | POA: Insufficient documentation

## 2022-09-05 DIAGNOSIS — Z7984 Long term (current) use of oral hypoglycemic drugs: Secondary | ICD-10-CM | POA: Diagnosis not present

## 2022-09-05 DIAGNOSIS — E8770 Fluid overload, unspecified: Secondary | ICD-10-CM | POA: Diagnosis present

## 2022-09-05 DIAGNOSIS — M7989 Other specified soft tissue disorders: Secondary | ICD-10-CM | POA: Diagnosis not present

## 2022-09-05 DIAGNOSIS — L97529 Non-pressure chronic ulcer of other part of left foot with unspecified severity: Secondary | ICD-10-CM | POA: Diagnosis not present

## 2022-09-05 DIAGNOSIS — E11622 Type 2 diabetes mellitus with other skin ulcer: Secondary | ICD-10-CM | POA: Diagnosis present

## 2022-09-05 DIAGNOSIS — G4733 Obstructive sleep apnea (adult) (pediatric): Secondary | ICD-10-CM | POA: Diagnosis present

## 2022-09-05 DIAGNOSIS — N5089 Other specified disorders of the male genital organs: Secondary | ICD-10-CM | POA: Diagnosis present

## 2022-09-05 DIAGNOSIS — Z0389 Encounter for observation for other suspected diseases and conditions ruled out: Secondary | ICD-10-CM | POA: Diagnosis not present

## 2022-09-05 DIAGNOSIS — N179 Acute kidney failure, unspecified: Secondary | ICD-10-CM | POA: Diagnosis not present

## 2022-09-05 DIAGNOSIS — L97519 Non-pressure chronic ulcer of other part of right foot with unspecified severity: Secondary | ICD-10-CM | POA: Diagnosis present

## 2022-09-05 DIAGNOSIS — Z794 Long term (current) use of insulin: Secondary | ICD-10-CM

## 2022-09-05 DIAGNOSIS — Z96652 Presence of left artificial knee joint: Secondary | ICD-10-CM | POA: Diagnosis present

## 2022-09-05 DIAGNOSIS — Z1152 Encounter for screening for COVID-19: Secondary | ICD-10-CM

## 2022-09-05 DIAGNOSIS — Z6838 Body mass index (BMI) 38.0-38.9, adult: Secondary | ICD-10-CM

## 2022-09-05 DIAGNOSIS — R42 Dizziness and giddiness: Secondary | ICD-10-CM | POA: Diagnosis not present

## 2022-09-05 DIAGNOSIS — L03116 Cellulitis of left lower limb: Secondary | ICD-10-CM | POA: Diagnosis present

## 2022-09-05 DIAGNOSIS — L97509 Non-pressure chronic ulcer of other part of unspecified foot with unspecified severity: Secondary | ICD-10-CM | POA: Diagnosis not present

## 2022-09-05 DIAGNOSIS — E785 Hyperlipidemia, unspecified: Secondary | ICD-10-CM | POA: Diagnosis present

## 2022-09-05 DIAGNOSIS — R079 Chest pain, unspecified: Secondary | ICD-10-CM | POA: Diagnosis not present

## 2022-09-05 DIAGNOSIS — J9601 Acute respiratory failure with hypoxia: Secondary | ICD-10-CM | POA: Diagnosis not present

## 2022-09-05 DIAGNOSIS — R531 Weakness: Secondary | ICD-10-CM | POA: Diagnosis not present

## 2022-09-05 DIAGNOSIS — I119 Hypertensive heart disease without heart failure: Secondary | ICD-10-CM | POA: Diagnosis present

## 2022-09-05 DIAGNOSIS — Z7982 Long term (current) use of aspirin: Secondary | ICD-10-CM

## 2022-09-05 DIAGNOSIS — K219 Gastro-esophageal reflux disease without esophagitis: Secondary | ICD-10-CM | POA: Diagnosis present

## 2022-09-05 DIAGNOSIS — L089 Local infection of the skin and subcutaneous tissue, unspecified: Secondary | ICD-10-CM | POA: Diagnosis not present

## 2022-09-05 DIAGNOSIS — Z888 Allergy status to other drugs, medicaments and biological substances status: Secondary | ICD-10-CM

## 2022-09-05 DIAGNOSIS — E876 Hypokalemia: Secondary | ICD-10-CM | POA: Diagnosis present

## 2022-09-05 DIAGNOSIS — I10 Essential (primary) hypertension: Secondary | ICD-10-CM | POA: Diagnosis not present

## 2022-09-05 DIAGNOSIS — B356 Tinea cruris: Secondary | ICD-10-CM | POA: Diagnosis present

## 2022-09-05 DIAGNOSIS — Z6837 Body mass index (BMI) 37.0-37.9, adult: Secondary | ICD-10-CM

## 2022-09-05 DIAGNOSIS — I219 Acute myocardial infarction, unspecified: Secondary | ICD-10-CM | POA: Diagnosis not present

## 2022-09-05 DIAGNOSIS — E119 Type 2 diabetes mellitus without complications: Secondary | ICD-10-CM

## 2022-09-05 DIAGNOSIS — Z955 Presence of coronary angioplasty implant and graft: Secondary | ICD-10-CM

## 2022-09-05 DIAGNOSIS — R600 Localized edema: Secondary | ICD-10-CM | POA: Diagnosis not present

## 2022-09-05 DIAGNOSIS — R234 Changes in skin texture: Secondary | ICD-10-CM | POA: Diagnosis not present

## 2022-09-05 DIAGNOSIS — E782 Mixed hyperlipidemia: Secondary | ICD-10-CM | POA: Diagnosis not present

## 2022-09-05 DIAGNOSIS — A419 Sepsis, unspecified organism: Secondary | ICD-10-CM | POA: Diagnosis not present

## 2022-09-05 DIAGNOSIS — L03032 Cellulitis of left toe: Secondary | ICD-10-CM | POA: Diagnosis present

## 2022-09-05 DIAGNOSIS — Z8249 Family history of ischemic heart disease and other diseases of the circulatory system: Secondary | ICD-10-CM

## 2022-09-05 DIAGNOSIS — Z880 Allergy status to penicillin: Secondary | ICD-10-CM

## 2022-09-05 DIAGNOSIS — E118 Type 2 diabetes mellitus with unspecified complications: Secondary | ICD-10-CM | POA: Diagnosis not present

## 2022-09-05 LAB — GLUCOSE, CAPILLARY
Glucose-Capillary: 144 mg/dL — ABNORMAL HIGH (ref 70–99)
Glucose-Capillary: 148 mg/dL — ABNORMAL HIGH (ref 70–99)

## 2022-09-05 LAB — BASIC METABOLIC PANEL
Anion gap: 12 (ref 5–15)
BUN: 17 mg/dL (ref 8–23)
CO2: 24 mmol/L (ref 22–32)
Calcium: 9.2 mg/dL (ref 8.9–10.3)
Chloride: 102 mmol/L (ref 98–111)
Creatinine, Ser: 1.02 mg/dL (ref 0.61–1.24)
GFR, Estimated: >60 mL/min (ref >60)
Glucose, Bld: 157 mg/dL — ABNORMAL HIGH (ref 70–99)
Potassium: 3.7 mmol/L (ref 3.5–5.1)
Sodium: 138 mmol/L (ref 135–145)

## 2022-09-05 LAB — CBC
HCT: 50.5 % (ref 39.0–52.0)
HCT: 46.1 % (ref 39.0–52.0)
Hemoglobin: 16.9 g/dL (ref 13.0–17.0)
Hemoglobin: 15.1 g/dL (ref 13.0–17.0)
MCH: 28.9 pg (ref 26.0–34.0)
MCH: 28.3 pg (ref 26.0–34.0)
MCHC: 33.5 g/dL (ref 30.0–36.0)
MCHC: 32.8 g/dL (ref 30.0–36.0)
MCV: 86.3 fL (ref 80.0–100.0)
MCV: 86.3 fL (ref 80.0–100.0)
Platelets: 105 10*3/uL — ABNORMAL LOW (ref 150–400)
Platelets: 108 10*3/uL — ABNORMAL LOW (ref 150–400)
RBC: 5.85 MIL/uL — ABNORMAL HIGH (ref 4.22–5.81)
RBC: 5.34 MIL/uL (ref 4.22–5.81)
RDW: 14.5 % (ref 11.5–15.5)
RDW: 14.5 % (ref 11.5–15.5)
WBC: 7.5 10*3/uL (ref 4.0–10.5)
WBC: 10.4 10*3/uL (ref 4.0–10.5)
nRBC: 0 % (ref 0.0–0.2)
nRBC: 0 % (ref 0.0–0.2)

## 2022-09-05 LAB — LACTIC ACID, PLASMA
Lactic Acid, Venous: 2.1 mmol/L (ref 0.5–1.9)
Lactic Acid, Venous: 2.1 mmol/L (ref 0.5–1.9)

## 2022-09-05 LAB — URINALYSIS, W/ REFLEX TO CULTURE (INFECTION SUSPECTED)
Bacteria, UA: NONE SEEN
Bilirubin Urine: NEGATIVE
Glucose, UA: >=500 mg/dL — AB
Hgb urine dipstick: NEGATIVE
Ketones, ur: NEGATIVE mg/dL
Leukocytes,Ua: NEGATIVE
Nitrite: NEGATIVE
Protein, ur: NEGATIVE mg/dL
Specific Gravity, Urine: 1.025 (ref 1.005–1.030)
pH: 5 (ref 5.0–8.0)

## 2022-09-05 LAB — CREATININE, SERUM
Creatinine, Ser: 1.3 mg/dL — ABNORMAL HIGH (ref 0.61–1.24)
GFR, Estimated: >60 mL/min (ref >60)

## 2022-09-05 LAB — SARS CORONAVIRUS 2 BY RT PCR: SARS Coronavirus 2 by RT PCR: NEGATIVE

## 2022-09-05 LAB — CBG MONITORING, ED: Glucose-Capillary: 159 mg/dL — ABNORMAL HIGH (ref 70–99)

## 2022-09-05 LAB — APTT: aPTT: 31 seconds (ref 24–36)

## 2022-09-05 LAB — PROTIME-INR
INR: 1.1 (ref 0.8–1.2)
Prothrombin Time: 14.3 seconds (ref 11.4–15.2)

## 2022-09-05 MED ORDER — SODIUM CHLORIDE 0.9 % IV SOLN: INTRAVENOUS | Status: DC

## 2022-09-05 MED ORDER — LACTATED RINGERS IV BOLUS: 1000.0000 mL | Freq: Once | INTRAVENOUS | Status: AC

## 2022-09-05 MED ORDER — ACETAMINOPHEN 325 MG PO TABS
650.0000 mg | ORAL_TABLET | Freq: Four times a day (QID) | ORAL | Status: DC | PRN
  Administered 2022-09-05 – 2022-09-12 (×12): 650 mg via ORAL
  Filled 2022-09-05 (×12): qty 2

## 2022-09-05 MED ORDER — POLYETHYLENE GLYCOL 3350 17 G PO PACK
17.0000 g | PACK | Freq: Every day | ORAL | Status: DC | PRN
  Administered 2022-09-10: 17 g via ORAL
  Filled 2022-09-05: qty 1

## 2022-09-05 MED ORDER — ACETAMINOPHEN 500 MG PO TABS
1000.0000 mg | ORAL_TABLET | Freq: Once | ORAL | Status: AC
  Administered 2022-09-05: 1000 mg via ORAL
  Filled 2022-09-05: qty 2

## 2022-09-05 MED ORDER — ONDANSETRON HCL 4 MG/2ML IJ SOLN
4.0000 mg | Freq: Four times a day (QID) | INTRAMUSCULAR | Status: DC | PRN
  Administered 2022-09-08: 4 mg via INTRAVENOUS
  Filled 2022-09-05: qty 2

## 2022-09-05 MED ORDER — ALBUTEROL SULFATE (2.5 MG/3ML) 0.083% IN NEBU: 2.5000 mg | INHALATION_SOLUTION | RESPIRATORY_TRACT | Status: DC | PRN

## 2022-09-05 MED ORDER — ENOXAPARIN SODIUM 40 MG/0.4ML IJ SOSY
40.0000 mg | PREFILLED_SYRINGE | INTRAMUSCULAR | Status: DC
  Administered 2022-09-05 – 2022-09-12 (×8): 40 mg via SUBCUTANEOUS
  Filled 2022-09-05 (×8): qty 0.4

## 2022-09-05 MED ORDER — PANTOPRAZOLE SODIUM 40 MG PO TBEC
40.0000 mg | DELAYED_RELEASE_TABLET | Freq: Every day | ORAL | Status: DC
  Administered 2022-09-05 – 2022-09-13 (×9): 40 mg via ORAL
  Filled 2022-09-05 (×9): qty 1

## 2022-09-05 MED ORDER — SODIUM CHLORIDE 0.9 % IV SOLN
2.0000 g | Freq: Three times a day (TID) | INTRAVENOUS | Status: DC
  Administered 2022-09-05: 2 g via INTRAVENOUS
  Filled 2022-09-05: qty 12.5

## 2022-09-05 MED ORDER — VANCOMYCIN HCL IN DEXTROSE 1-5 GM/200ML-% IV SOLN
1000.0000 mg | Freq: Once | INTRAVENOUS | Status: AC
  Administered 2022-09-05: 1000 mg via INTRAVENOUS
  Filled 2022-09-05: qty 200

## 2022-09-05 MED ORDER — SODIUM CHLORIDE 0.9 % IV BOLUS
1000.0000 mL | Freq: Once | INTRAVENOUS | Status: AC
  Administered 2022-09-05: 1000 mL via INTRAVENOUS

## 2022-09-05 MED ORDER — PREGABALIN 75 MG PO CAPS
150.0000 mg | ORAL_CAPSULE | Freq: Two times a day (BID) | ORAL | Status: DC
  Administered 2022-09-05 – 2022-09-13 (×16): 150 mg via ORAL
  Filled 2022-09-05 (×16): qty 2

## 2022-09-05 MED ORDER — ASPIRIN 325 MG PO TBEC
325.0000 mg | DELAYED_RELEASE_TABLET | Freq: Every day | ORAL | Status: DC
  Administered 2022-09-05 – 2022-09-12 (×8): 325 mg via ORAL
  Filled 2022-09-05 (×8): qty 1

## 2022-09-05 MED ORDER — VANCOMYCIN HCL 1750 MG/350ML IV SOLN
1750.0000 mg | INTRAVENOUS | Status: DC
  Filled 2022-09-05: qty 350

## 2022-09-05 MED ORDER — INSULIN ASPART 100 UNIT/ML IJ SOLN
0.0000 [IU] | Freq: Three times a day (TID) | INTRAMUSCULAR | Status: DC
  Administered 2022-09-06 – 2022-09-09 (×4): 1 [IU] via SUBCUTANEOUS
  Administered 2022-09-09: 2 [IU] via SUBCUTANEOUS
  Administered 2022-09-10: 1 [IU] via SUBCUTANEOUS
  Administered 2022-09-10 (×2): 2 [IU] via SUBCUTANEOUS
  Administered 2022-09-11: 1 [IU] via SUBCUTANEOUS
  Administered 2022-09-11 (×2): 2 [IU] via SUBCUTANEOUS
  Administered 2022-09-12 (×2): 3 [IU] via SUBCUTANEOUS
  Administered 2022-09-13: 5 [IU] via SUBCUTANEOUS

## 2022-09-05 MED ORDER — ACETAMINOPHEN 650 MG RE SUPP: 650.0000 mg | Freq: Four times a day (QID) | RECTAL | Status: DC | PRN

## 2022-09-05 MED ORDER — ATORVASTATIN CALCIUM 80 MG PO TABS
80.0000 mg | ORAL_TABLET | Freq: Every day | ORAL | Status: DC
  Administered 2022-09-05 – 2022-09-12 (×8): 80 mg via ORAL
  Filled 2022-09-05 (×8): qty 1

## 2022-09-05 MED ORDER — ONDANSETRON HCL 4 MG PO TABS: 4.0000 mg | ORAL_TABLET | Freq: Four times a day (QID) | ORAL | Status: DC | PRN

## 2022-09-05 MED ORDER — METRONIDAZOLE 500 MG/100ML IV SOLN
500.0000 mg | Freq: Once | INTRAVENOUS | Status: AC
  Administered 2022-09-05: 500 mg via INTRAVENOUS
  Filled 2022-09-05: qty 100

## 2022-09-05 MED ORDER — NITROGLYCERIN 0.4 MG SL SUBL
0.4000 mg | SUBLINGUAL_TABLET | SUBLINGUAL | Status: DC | PRN
  Administered 2022-09-06 (×3): 0.4 mg via SUBLINGUAL
  Filled 2022-09-05: qty 1

## 2022-09-05 MED ORDER — LACTATED RINGERS IV BOLUS (SEPSIS): 1000.0000 mL | Freq: Once | INTRAVENOUS | Status: DC

## 2022-09-05 NOTE — ED Notes (Signed)
Cl called for transport

## 2022-09-05 NOTE — Progress Notes (Signed)
Temp came down to 100 after the Tylenol. Will continue to monitor.

## 2022-09-05 NOTE — ED Notes (Signed)
Pt placed on 2 L O2 via [place] secondary to low O2 sat.   

## 2022-09-05 NOTE — ED Notes (Signed)
ED TO INPATIENT HANDOFF REPORT  ED Nurse Name and Phone #: [phone]  S Name/Age/Gender [name] 67 y.o. male Room/Bed: DB007/DB007  Code Status   Code Status: Prior  Home/SNF/Other Home Patient oriented to: self, place, time, and situation Is this baseline? Yes   Triage Complete: Triage complete  Chief Complaint Acute respiratory failure with hypoxia (HCC) [J96.01]  Triage Note Pt arrives to ED with c/o nausea and weakness that started at 7am this morning after working out. Pt notes ulcer to right midfoot and was seen by podiatry yesterday with concern for possible sepsis. Was started on bactrim yesterday.    Allergies Allergies  Allergen Reactions   Metformin Other (See Comments)   Penicillins Rash    Has patient had a PCN reaction causing immediate rash, facial/tongue/throat swelling, SOB or lightheadedness with hypotension: Yes Has patient had a PCN reaction causing severe rash involving mucus membranes or skin necrosis: Unk Has patient had a PCN reaction that required hospitalization: Unk Has patient had a PCN reaction occurring within the last 10 years: No If all of the above answers are "NO", then may proceed with Cephalosporin use.     Level of Care/Admitting Diagnosis ED Disposition     ED Disposition  Admit   Condition  --   Comment  Hospital Area: [facility] [[id number]]  Level of Care: Progressive [102]  Admit to Progressive based on following criteria: MULTISYSTEM THREATS such as stable sepsis, metabolic/electrolyte imbalance with or without encephalopathy that is responding to early treatment.  Interfacility transfer: Yes  May place patient in observation at [facility] or [facility] if equivalent level of care is available:: No  Covid Evaluation: Asymptomatic - no recent exposure (last 10 days) testing not required  Diagnosis: Acute respiratory failure with hypoxia [facility]) [[id number]]  Admitting Physician: Synetta Fail  [[id number]]  Attending Physician: Synetta Fail [[id number]]          B Medical/Surgery History Past Medical History:  Diagnosis Date   Arthritis    "ankles, knees" (01/29/2018)   CAD in native artery    cath report from 02/2017 indicates patient had a history of RCA stent >10 years prior to that. At time of that cath he was found to have EF 70%, 20-30% ostial LAD, 30-40% prox LAD, 30-40% Cx, 80-90% Cx beyond OM1, totally occluded RCA with left-to-right collaterals.   Cellulitis 03/03/2022   Diabetes mellitus type 2 in obese    GERD (gastroesophageal reflux disease)    Hyperlipidemia    Hypertension    Morbid obesity (HCC)    Onychomycosis 03/03/2022   OSA on CPAP    Past Surgical History:  Procedure Laterality Date   CARDIAC CATHETERIZATION  2008   CORONARY ANGIOPLASTY WITH STENT PLACEMENT  1997   JOINT REPLACEMENT     KNEE ARTHROSCOPY Bilateral    "meniscus repairs"   TONSILLECTOMY     TOTAL KNEE ARTHROPLASTY Left 01/29/2018   TOTAL KNEE ARTHROPLASTY Left 01/29/2018   Procedure: LEFT TOTAL KNEE ARTHROPLASTY;  Surgeon: Tarry Kos, MD;  Location: [facility] OR;  Service: Orthopedics;  Laterality: Left;   UVULOPALATOPHARYNGOPLASTY  ~ 1994     A IV Location/Drains/Wounds Patient Lines/Drains/Airways Status     Active Line/Drains/Airways     Name Placement date Placement time Site Days   Peripheral IV 09/05/22 20 G Anterior;Right Forearm 09/05/22  1126  Forearm  less than 1   Peripheral IV 09/05/22 18 G 1.88" Anterior;Left Forearm 09/05/22  1256  Forearm  less than 1            Intake/Output Last 24 hours  Intake/Output Summary (Last 24 hours) at 09/05/2022 1443 Last data filed at 09/05/2022 1443 Gross per 24 hour  Intake 293.47 ml  Output --  Net 293.47 ml    Labs/Imaging Results for orders placed or performed during the hospital encounter of 09/05/22 (from the past 48 hour(s))  CBG monitoring, ED     Status: Abnormal   Collection Time: 09/05/22 11:15 AM   Result Value Ref Range   Glucose-Capillary 159 (H) 70 - 99 mg/dL    Comment: Glucose reference range applies only to samples taken after fasting for at least 8 hours.  Basic metabolic panel     Status: Abnormal   Collection Time: 09/05/22 11:22 AM  Result Value Ref Range   Sodium 138 135 - 145 mmol/L   Potassium 3.7 3.5 - 5.1 mmol/L   Chloride 102 98 - 111 mmol/L   CO2 24 22 - 32 mmol/L   Glucose, Bld 157 (H) 70 - 99 mg/dL    Comment: Glucose reference range applies only to samples taken after fasting for at least 8 hours.   BUN 17 8 - 23 mg/dL   Creatinine, Ser 0.98 0.61 - 1.24 mg/dL   Calcium 9.2 8.9 - 11.9 mg/dL   GFR, Estimated >14 >78 mL/min    Comment: (NOTE) Calculated using the CKD-EPI Creatinine Equation (2021)    Anion gap 12 5 - 15    Comment: Performed at [facility], [address] [place], [place] [address]  CBC     Status: Abnormal   Collection Time: 09/05/22 11:22 AM  Result Value Ref Range   WBC 7.5 4.0 - 10.5 K/uL   RBC 5.85 (H) 4.22 - 5.81 MIL/uL   Hemoglobin 16.9 13.0 - 17.0 g/dL   HCT 13.0 86.5 - 78.4 %   MCV 86.3 80.0 - 100.0 fL   MCH 28.9 26.0 - 34.0 pg   MCHC 33.5 30.0 - 36.0 g/dL   RDW 69.6 29.5 - 28.4 %   Platelets 105 (L) 150 - 400 K/uL   nRBC 0.0 0.0 - 0.2 %    Comment: Performed at [facility], [address] [place], [place] [address]  Lactic acid, plasma     Status: Abnormal   Collection Time: 09/05/22 12:37 PM  Result Value Ref Range   Lactic Acid, Venous 2.1 (HH) 0.5 - 1.9 mmol/L    Comment: CRITICAL RESULT CALLED TO, READ BACK BY AND VERIFIED WITH: MADISON FOUNTAIN,RN AT 1333 ON 09/05/22 Performed at [facility], [address], [place], [place] [address]   Protime-INR     Status: None   Collection Time: 09/05/22 12:37 PM  Result Value Ref Range   Prothrombin Time 14.3 11.4 - 15.2 seconds   INR 1.1 0.8 - 1.2    Comment: (NOTE) INR goal varies based on device and  disease states. Performed at [facility], [address], [place], [place] [address]   APTT     Status: None   Collection Time: 09/05/22 12:37 PM  Result Value Ref Range   aPTT 31 24 - 36 seconds    Comment: Performed at [facility], [address] [place], [place] [address]   DG Toe 2nd Left  Result Date: 09/05/2022 CLINICAL DATA:  Right foot wound. EXAM: LEFT SECOND TOE COMPARISON:  January 25, 2022. FINDINGS: There is no evidence of fracture or dislocation. There is  no evidence of arthropathy or other focal bone abnormality. Soft tissues are unremarkable. IMPRESSION: Negative. Electronically Signed   By: Lupita Raider M.D.   On: 09/05/2022 12:26   DG Foot 2 Views Right  Result Date: 09/05/2022 CLINICAL DATA:  Midfoot ulcer. EXAM: RIGHT FOOT - 2 VIEW COMPARISON:  09/17/2021 FINDINGS: Two views study shows no definite fracture or dislocation. Advanced arthropathy again noted in the midfoot, similar to prior. No gross bony destruction to suggest osteomyelitis. IMPRESSION: Advanced arthropathy in the midfoot, similar to prior. No gross bony destruction to suggest osteomyelitis. MRI of the foot with and without contrast could be used for more sensitive assessment as clinically warranted. Electronically Signed   By: Kennith Center M.D.   On: 09/05/2022 12:23   DG Chest Portable 1 View  Result Date: 09/05/2022 CLINICAL DATA:  Weakness EXAM: PORTABLE CHEST 1 VIEW COMPARISON:  CXR 01/25/22 FINDINGS: No pleural effusion. No pneumothorax. Unchanged cardiac and mediastinal contours mild cardiomegaly. Hazy opacity at the left lung base is favored represent atelectasis, but infection is not excluded. No radiographically apparent displaced rib fractures. Visualized upper abdomen is unremarkable. IMPRESSION: Cardiomegaly and left basilar atelectasis. Electronically Signed   By: Lorenza Cambridge M.D.   On: 09/05/2022 12:23    Pending Labs Unresulted Labs (From  admission, onward)     Start     Ordered   09/05/22 1419  SARS Coronavirus 2 by RT PCR (hospital order, performed in [facility] hospital lab) *cepheid single result test* Anterior Nasal Swab  ONCE - URGENT,   URGENT        09/05/22 1418   09/05/22 1238  Urinalysis, w/ Reflex to Culture (Infection Suspected) -Urine, Clean Catch  (Undifferentiated presentation (screening labs and basic nursing orders))  ONCE - URGENT,   URGENT       Question:  Specimen Source  Answer:  Urine, Clean Catch   09/05/22 1238   09/05/22 1237  Lactic acid, plasma  (Undifferentiated presentation (screening labs and basic nursing orders))  STAT Now then every 2 hours,   R (with STAT occurrences)      09/05/22 1238   09/05/22 1237  Blood Culture (routine x 2)  (Undifferentiated presentation (screening labs and basic nursing orders))  BLOOD CULTURE X 2,   STAT      09/05/22 1238            Vitals/Pain Today's Vitals   09/05/22 1334 09/05/22 1335 09/05/22 1345 09/05/22 1400  BP:  101/60 (!) 90/58 (!) 89/54  Pulse:  87 87 85  Resp:  (!) 21 (!) 22 (!) 21  Temp:      TempSrc:      SpO2:  96% 95% 95%  Weight:      Height:      PainSc: 0-No pain       Isolation Precautions No active isolations  Medications Medications  ceFEPIme (MAXIPIME) 2 g in sodium chloride 0.9 % 100 mL IVPB (0 g Intravenous Stopped 09/05/22 1337)  vancomycin (VANCOCIN) IVPB 1000 mg/200 mL premix (0 mg Intravenous Stopped 09/05/22 1443)    And  vancomycin (VANCOCIN) IVPB 1000 mg/200 mL premix (has no administration in time range)  vancomycin (VANCOREADY) IVPB 1750 mg/350 mL (has no administration in time range)  sodium chloride 0.9 % bolus 1,000 mL (1,000 mLs Intravenous New Bag/Given 09/05/22 1230)  acetaminophen (TYLENOL) tablet 1,000 mg (1,000 mg Oral Given 09/05/22 1244)  metroNIDAZOLE (FLAGYL) IVPB 500 mg (500 mg Intravenous New Bag/Given 09/05/22 1342)  Mobility walks       R Recommendations: See Admitting Provider  Note  Report given to:   Additional Notes:

## 2022-09-05 NOTE — ED Triage Notes (Signed)
Pt arrives to ED with c/o nausea and weakness that started at 7am this morning after working out. Pt notes ulcer to right midfoot and was seen by podiatry yesterday with concern for possible sepsis. Was started on bactrim yesterday.

## 2022-09-05 NOTE — Telephone Encounter (Signed)
Pts daughter called and pt started the new antibiotic yesterday and woke up this morning throwing up and having chills. Should he come in to the office or go to the ED?

## 2022-09-05 NOTE — ED Notes (Signed)
[facility] to start LR bolus.

## 2022-09-05 NOTE — ED Notes (Signed)
Report given to [facility] 

## 2022-09-05 NOTE — ED Notes (Signed)
Blood culture number two drawn via 21 gauge butterfly from R wrist on second attempt.  Pt tolerated well. Md at bedside for heart ultrasound.

## 2022-09-05 NOTE — Progress Notes (Signed)
Subjective: Chief Complaint  Patient presents with   Foot Ulcer      67 year old male presents the office today for follow-up evaluation for ulcerations on both feet.  He states the ulcer on the midfoot on the left side is doing much better.  Still is a small open on the right foot.  He is not seeing drainage or pus to the toenail or the tip of the second toe.  No fevers or chills.  No other concerns.   Objective: AAO x3, NAD DP/PT pulses palpable bilaterally, CRT less than 3 seconds Left foot: There is minimal hyperkeratotic tissue on the plantar medial aspect of the midfoot.  This is doing much better.  There is no underlying ulceration drainage or signs of infection.  At the tip of the second toe superficial granular wound is present with some localized edema and erythema at the tip of the toe but there is no ascending cellulitis.  Old blister present but there is no drainage or pus or any fluctuation or crepitation.  There is no malodor.  Right foot: Hyperkeratotic lesion on the midfoot on the right side plantarly.  Hyperkeratotic periwound with central granular wound without any probing to bone, and or tunneling.  There is no fluctuation or crepitation but there is no odor. Significant flatfoot present. No pain with calf compression, swelling, warmth, erythema  Assessment: Right foot ulceration, left second toe ulceration  Plan: -All treatment options discussed with the patient including all alternatives, risks, complications.  -Sharply debride the hyperkeratotic lesion, wound on the plantar aspect the right midfoot without any complications.  Wound is 100% granular. -Debrided some callus formation to the left second toe.  There is still localized edema erythema present.  Due to this we will start antibiotics and prescribed Bactrim.  Monitoring signs or symptoms of worsening infection report to emergency room should any occur.  X-ray left foot next appointment  Vivi Barrack  DPM

## 2022-09-05 NOTE — ED Notes (Signed)
[facility] at bedside 

## 2022-09-05 NOTE — Progress Notes (Signed)
the patient is running temp of 102.3. I just gave him Tylenol 650 mg PRN and will be rechecked in an hour. Notified Dr. Imogene Burn to see if there is anything else he would like to do.  Patient said he uses CPAP at night and he  will like to use it tonight. Received order for the CPAP and he said will keep on monitoring.

## 2022-09-05 NOTE — H&P (Addendum)
History and Physical    Patient: [name]:[id number] DOB: 10-31-1955 DOA: 09/05/2022 DOS: the patient was seen and examined on 09/05/2022 PCP: Emilio Aspen, MD  Patient coming from: Home  Chief Complaint:  Chief Complaint  Patient presents with   Nausea   Weakness   HPI: [name] is a 67 y.o. male with medical history significant of  CAD s/p PCI, DM-2, HTN, HLD, peripheral neuropathy-who [facility] for evaluation of fever or chills.  Per patient-he has a chronic bilateral foot wounds-there are followed closely by podiatry-he recently underwent some debridement of his left second toe-and was prescribed Bactrim on 5/23.  He was in his usual state of health till earlier this morning-when he did his usual morning workout-following which he felt weak-felt tremors/chills and felt feverish.  He called his daughter-he was afebrile at [facility] upon presenting to med [facility] he was found to have a fever of 103 F rectally.  His initial blood pressure was stable but subsequently developed transient hypotension with systolic blood pressure of 89.  He denies any headache/neck pain.  He also denied any cough or shortness of breath.  No diarrhea or abdominal pain.  No dysuria or hematuria or frequency of urination.  He has not had any recent sick contacts.   While at [facility]-he was given a fluid boluses, started on vancomycin/cefepime/Flagyl, cultures were obtained-and was subsequently transferred to [facility] hospitalist service for further evaluation and treatment.  Review of Systems: As mentioned in the history of present illness. All other systems reviewed and are negative. Past Medical History:  Diagnosis Date   Arthritis    "ankles, knees" (01/29/2018)   CAD in native artery    cath report from 02/2017 indicates patient had a history of RCA stent >10 years prior to that. At time of that cath he was found to have EF 70%, 20-30%  ostial LAD, 30-40% prox LAD, 30-40% Cx, 80-90% Cx beyond OM1, totally occluded RCA with left-to-right collaterals.   Cellulitis 03/03/2022   Diabetes mellitus type 2 in obese    GERD (gastroesophageal reflux disease)    Hyperlipidemia    Hypertension    Morbid obesity (HCC)    Onychomycosis 03/03/2022   OSA on CPAP    Past Surgical History:  Procedure Laterality Date   CARDIAC CATHETERIZATION  2008   CORONARY ANGIOPLASTY WITH STENT PLACEMENT  1997   JOINT REPLACEMENT     KNEE ARTHROSCOPY Bilateral    "meniscus repairs"   TONSILLECTOMY     TOTAL KNEE ARTHROPLASTY Left 01/29/2018   TOTAL KNEE ARTHROPLASTY Left 01/29/2018   Procedure: LEFT TOTAL KNEE ARTHROPLASTY;  Surgeon: Tarry Kos, MD;  Location: [facility] OR;  Service: Orthopedics;  Laterality: Left;   UVULOPALATOPHARYNGOPLASTY  ~ 1994   Social History:  reports that he has never smoked. He has never been exposed to tobacco smoke. He has never used smokeless tobacco. He reports current alcohol use. He reports that he does not use drugs.  Allergies  Allergen Reactions   Metformin Other (See Comments)   Penicillins Rash    Has patient had a PCN reaction causing immediate rash, facial/tongue/throat swelling, SOB or lightheadedness with hypotension: Yes Has patient had a PCN reaction causing severe rash involving mucus membranes or skin necrosis: Unk Has patient had a PCN reaction that required hospitalization: Unk Has patient had a PCN reaction occurring within the last 10 years: No If all of the above answers are "NO",  then may proceed with Cephalosporin use.     Family History  Problem Relation Age of Onset   Heart attack Mother    Heart attack Father     Prior to Admission medications   Medication Sig Start Date End Date Taking? Authorizing Provider  acetaminophen (TYLENOL) 650 MG CR tablet Take 1,300 mg by mouth at bedtime.    [provider]  aspirin EC 325 MG tablet Take 325 mg by mouth at bedtime.    [provider]  atorvastatin (LIPITOR) 80 MG tablet Take 80 mg by mouth at bedtime.  02/09/13   [provider]  celecoxib (CELEBREX) 200 MG capsule TAKE 1 CAPSULE BY MOUTH EVERY DAY 07/23/22   Vivi Barrack, DPM  Cholecalciferol (VITAMIN D3) 2000 units TABS Take 2,000 Units by mouth daily.    [provider]  doxycycline (VIBRA-TABS) 100 MG tablet Take 1 tablet (100 mg total) by mouth 2 (two) times daily. 07/07/22   Vivi Barrack, DPM  empagliflozin (JARDIANCE) 25 MG TABS tablet Take 1 tablet (25 mg total) by mouth daily. 01/09/22   Lyn Records, MD  glimepiride (AMARYL) 2 MG tablet Take 2 mg by mouth daily. 12/03/20   [provider]  Glucosamine-Chondroitin (COSAMIN DS PO) Take 2 capsules by mouth daily with breakfast.    [provider]  isosorbide mononitrate (IMDUR) 120 MG 24 hr tablet TAKE 1 TABLET BY MOUTH EVERY DAY 01/08/22   Lyn Records, MD  Menthol, Topical Analgesic, 4 % GEL Apply 1 application topically See admin instructions. Apply to both feet at bedtime for neuropathy    [provider]  metoprolol succinate (TOPROL-XL) 100 MG 24 hr tablet TAKE 1 TABLET BY MOUTH DAILY. PLEASE CALL TO SCHEDULE AN OVERDUE APPOINTMENT WITH DR. Katrinka Blazing FOR REFILLS, [phone], THANK YOU. 3RD ATTEMPT. NO MORE REFILLS GIVEN 01/07/22   Lyn Records, MD  MOUNJARO 2.5 MG/0.5ML Pen SMARTSIG:2.5 Milligram(s) SUB-Q Once a Week Patient not taking: Reported on 06/16/2022 02/26/22   [provider]  Multiple Vitamin (MULTIVITAMIN) tablet Take 1 tablet by mouth daily.    [provider]  nitroGLYCERIN (NITROSTAT) 0.4 MG SL tablet Place 1 tablet (0.4 mg total) under the tongue every 5 (five) minutes x 3 doses as needed for chest pain. 12/07/20   Lyn Records, MD  omeprazole (PRILOSEC) 20 MG capsule Take 20 mg by mouth daily.  02/03/13   [provider]  pregabalin (LYRICA) 150 MG capsule Take 1 capsule (150 mg total) by mouth 2 (two)  times daily. 07/14/22   Vivi Barrack, DPM  senna-docusate (SENOKOT S) 8.6-50 MG tablet Take 1 tablet by mouth at bedtime as needed. Patient taking differently: Take 2 tablets by mouth at bedtime. 01/29/18   Tarry Kos, MD  sulfamethoxazole-trimethoprim (BACTRIM DS) 800-160 MG tablet Take 1 tablet by mouth 2 (two) times daily. 04/02/22   Vivi Barrack, DPM  sulfamethoxazole-trimethoprim (BACTRIM DS) 800-160 MG tablet Take 1 tablet by mouth 2 (two) times daily. 09/04/22   Vivi Barrack, DPM  tamsulosin (FLOMAX) 0.4 MG CAPS capsule Take 0.4 mg by mouth at bedtime. 04/05/22   [provider]  terbinafine (LAMISIL) 250 MG tablet Take 1 tablet (250 mg total) by mouth daily. 05/28/22   Vivi Barrack, DPM  Tetrahydrozoline HCl (VISINE OP) Place 1 drop into both eyes daily as needed (for dry or irritated eyes).     [provider]  triamterene-hydrochlorothiazide (MAXZIDE) 75-50 MG per tablet  Take 0.5 tablets by mouth daily.  01/06/13   [provider]  WELCHOL 625 MG tablet Take 1,875 mg by mouth at bedtime.  05/19/13   [provider]    Physical Exam: Vitals:   09/05/22 1445 09/05/22 1500 09/05/22 1501 09/05/22 1607  BP: (!) 98/59 (!) 91/57  101/63  Pulse: 81 81  76  Resp: 20 19  15   Temp:   98.5 F (36.9 C) 98.3 F (36.8 C)  TempSrc:   Oral Oral  SpO2: 94% 94%  95%  Weight:    112.5 kg  Height:    5\' 8"  (1.727 m)   Gen Exam:Alert awake-not in any distress HEENT:atraumatic, normocephalic Chest: B/L clear to auscultation anteriorly CVS:S1S2 regular Abdomen:soft non tender, non distended Extremities:no edema-see pictures below Neurology: Non focal Skin: no rash  Left second toe    Left second toe    Right foot-plantar surface    Data Reviewed:    Latest Ref Rng & Units 09/05/2022   11:22 AM 05/26/2022   11:05 AM 02/23/2022    2:16 PM  CBC  WBC 4.0 - 10.5 K/uL 7.5  6.4  6.5   Hemoglobin 13.0 - 17.0 g/dL 16.1  09.6  04.5    Hematocrit 39.0 - 52.0 % 50.5  48.8  49.0   Platelets 150 - 400 K/uL 105  159  142         Latest Ref Rng & Units 09/05/2022   11:22 AM 02/23/2022    2:16 PM 01/27/2022    4:35 AM  BMP  Glucose 70 - 99 mg/dL 409  811  914   BUN 8 - 23 mg/dL 17  16  16    Creatinine 0.61 - 1.24 mg/dL 7.82  9.56  2.13   Sodium 135 - 145 mmol/L 138  138  138   Potassium 3.5 - 5.1 mmol/L 3.7  3.3  3.2   Chloride 98 - 111 mmol/L 102  98  104   CO2 22 - 32 mmol/L 24  27  26    Calcium 8.9 - 10.3 mg/dL 9.2  9.6  8.5      Assessment and Plan: Sepsis-unclear source-but suspicion for right second toe diabetic foot infection He seems to have stabilized with IV antibiotics/IV fluids-his foot wounds really do not look that bad (see pictures above)-I will go and hold further antibiotics given that he has improved and he is stable.  He had transient hypotension/soft BP at [facility]-he is currently stable and Await blood cultures Obtain MRI imaging of the left foot to rule out osteo involving second toe-his right foot plantar ulcer seems to have healed completely and looks benign.  If MRI of the left foot is unrevealing-we can then pursue MRI imaging of the right foot. ESR/CRP with a.m. labs If he becomes hemodynamically unstable-can always restart antibiotics.  Transient hypoxia He apparently was hypoxic at [facility] I first walked in the room he was on 2 L of oxygen-I removed it and his O2 saturation remained consistently in the mid 90s on room air.  He has no shortness of breath/or any sort of respiratory symptoms.  Suspect transient hypoxia could have been in the setting of fever/chills.  CAD-s/p remote PCI No anginal symptoms Continue aspirin/statin Hold beta-blockers given soft transient hypotension while he was at [facility].  DM 2 Holding oral hypoglycemics Start SSI and monitor  HTN BP now stable Continue to hold all  antihypertensives  HLD  Statin  Peripheral neuropathy Resume Lyrica   Advance Care Planning:   Code Status: Full Code   Consults: None  Family Communication: None at bedside  Severity of Illness: The appropriate patient status for this patient is OBSERVATION. Observation status is judged to be reasonable and necessary in order to provide the required intensity of service to ensure the patient's safety. The patient's presenting symptoms, physical exam findings, and initial radiographic and laboratory data in the context of their medical condition is felt to place them at decreased risk for further clinical deterioration. Furthermore, it is anticipated that the patient will be medically stable for discharge from the hospital within 2 midnights of admission.   Author: Jeoffrey Massed, MD 09/05/2022 4:50 PM  For on call review [email or web address].

## 2022-09-05 NOTE — ED Provider Notes (Signed)
[place] EMERGENCY DEPARTMENT AT [facility] Provider Note   CSN: [id number] Arrival date & time: 09/05/22  1038     History {Add pertinent medical, surgical, social history, OB history to HPI:1} Chief Complaint  Patient presents with   Nausea   Weakness    [name] is a 67 y.o. male.  Patient presents with general weakness, nausea and feeling unwell since this morning after his workup.  Patient has an ulcer on his right foot and on his left second toe.  No fevers documented.  Patient started on Bactrim yesterday.  1 episode of vomiting.  Patient's had wound infection in the past.       Home Medications Prior to Admission medications   Medication Sig Start Date End Date Taking? Authorizing Provider  acetaminophen (TYLENOL) 650 MG CR tablet Take 1,300 mg by mouth at bedtime.    [provider]  aspirin EC 325 MG tablet Take 325 mg by mouth at bedtime.    [provider]  atorvastatin (LIPITOR) 80 MG tablet Take 80 mg by mouth at bedtime.  02/09/13   [provider]  celecoxib (CELEBREX) 200 MG capsule TAKE 1 CAPSULE BY MOUTH EVERY DAY 07/23/22   Vivi Barrack, DPM  Cholecalciferol (VITAMIN D3) 2000 units TABS Take 2,000 Units by mouth daily.    [provider]  doxycycline (VIBRA-TABS) 100 MG tablet Take 1 tablet (100 mg total) by mouth 2 (two) times daily. 07/07/22   Vivi Barrack, DPM  empagliflozin (JARDIANCE) 25 MG TABS tablet Take 1 tablet (25 mg total) by mouth daily. 01/09/22   Lyn Records, MD  glimepiride (AMARYL) 2 MG tablet Take 2 mg by mouth daily. 12/03/20   [provider]  Glucosamine-Chondroitin (COSAMIN DS PO) Take 2 capsules by mouth daily with breakfast.    [provider]  isosorbide mononitrate (IMDUR) 120 MG 24 hr tablet TAKE 1 TABLET BY MOUTH EVERY DAY 01/08/22   Lyn Records, MD  Menthol, Topical Analgesic, 4 % GEL Apply 1 application topically See admin instructions. Apply to  both feet at bedtime for neuropathy    [provider]  metoprolol succinate (TOPROL-XL) 100 MG 24 hr tablet TAKE 1 TABLET BY MOUTH DAILY. PLEASE CALL TO SCHEDULE AN OVERDUE APPOINTMENT WITH DR. Katrinka Blazing FOR REFILLS, [phone], THANK YOU. 3RD ATTEMPT. NO MORE REFILLS GIVEN 01/07/22   Lyn Records, MD  MOUNJARO 2.5 MG/0.5ML Pen SMARTSIG:2.5 Milligram(s) SUB-Q Once a Week Patient not taking: Reported on 06/16/2022 02/26/22   [provider]  Multiple Vitamin (MULTIVITAMIN) tablet Take 1 tablet by mouth daily.    [provider]  nitroGLYCERIN (NITROSTAT) 0.4 MG SL tablet Place 1 tablet (0.4 mg total) under the tongue every 5 (five) minutes x 3 doses as needed for chest pain. 12/07/20   Lyn Records, MD  omeprazole (PRILOSEC) 20 MG capsule Take 20 mg by mouth daily.  02/03/13   [provider]  pregabalin (LYRICA) 150 MG capsule Take 1 capsule (150 mg total) by mouth 2 (two) times daily. 07/14/22   Vivi Barrack, DPM  senna-docusate (SENOKOT S) 8.6-50 MG tablet Take 1 tablet by mouth at bedtime as needed. Patient taking differently: Take 2 tablets by mouth at bedtime. 01/29/18   Tarry Kos, MD  sulfamethoxazole-trimethoprim (BACTRIM DS) 800-160 MG tablet Take 1 tablet by mouth 2 (two) times daily. 04/02/22   Vivi Barrack, DPM  sulfamethoxazole-trimethoprim (BACTRIM DS) 800-160 MG tablet Take 1 tablet by mouth  2 (two) times daily. 09/04/22   Vivi Barrack, DPM  tamsulosin (FLOMAX) 0.4 MG CAPS capsule Take 0.4 mg by mouth at bedtime. 04/05/22   [provider]  terbinafine (LAMISIL) 250 MG tablet Take 1 tablet (250 mg total) by mouth daily. 05/28/22   Vivi Barrack, DPM  Tetrahydrozoline HCl (VISINE OP) Place 1 drop into both eyes daily as needed (for dry or irritated eyes).     [provider]  triamterene-hydrochlorothiazide (MAXZIDE) 75-50 MG per tablet Take 0.5 tablets by mouth daily.  01/06/13   [provider]  WELCHOL  625 MG tablet Take 1,875 mg by mouth at bedtime.  05/19/13   [provider]      Allergies    Metformin and Penicillins    Review of Systems   Review of Systems  Constitutional:  Positive for fever. Negative for chills.  HENT:  Negative for congestion.   Eyes:  Negative for visual disturbance.  Respiratory:  Negative for shortness of breath.   Cardiovascular:  Negative for chest pain.  Gastrointestinal:  Positive for nausea and vomiting. Negative for abdominal pain.  Genitourinary:  Negative for dysuria and flank pain.  Musculoskeletal:  Negative for back pain, neck pain and neck stiffness.  Skin:  Negative for rash.  Neurological:  Positive for dizziness and light-headedness. Negative for headaches.    Physical Exam Updated Vital Signs BP 118/65 (BP Location: Left Arm)   Pulse 88   Temp (!) 103.3 F (39.6 C) (Rectal)   Resp (!) 21   Ht 5\' 7"  (1.702 m)   Wt 112.5 kg   SpO2 93%   BMI 38.84 kg/m  Physical Exam Vitals and nursing note reviewed.  Constitutional:      General: He is not in acute distress.    Appearance: He is well-developed.  HENT:     Head: Normocephalic and atraumatic.     Mouth/Throat:     Mouth: Mucous membranes are dry.  Eyes:     General:        Right eye: No discharge.        Left eye: No discharge.     Conjunctiva/sclera: Conjunctivae normal.  Neck:     Trachea: No tracheal deviation.  Cardiovascular:     Rate and Rhythm: Normal rate and regular rhythm.  Pulmonary:     Effort: Pulmonary effort is normal.     Breath sounds: Rales (mild lower) present.  Abdominal:     General: There is no distension.     Palpations: Abdomen is soft.     Tenderness: There is no abdominal tenderness. There is no guarding.  Musculoskeletal:        General: Swelling (minimal LE bilateral) and tenderness present.     Cervical back: Normal range of motion and neck supple. No rigidity.  Skin:    General: Skin is warm.     Capillary Refill: Capillary  refill takes less than 2 seconds.     Comments: Patient has ulcer with mild drainage second toe on the left no streaking erythema no crepitus mild fluctuance.  Patient has superficial ulcer on the right distal plantar aspect of the foot without drainage.  Neurological:     General: No focal deficit present.     Mental Status: He is alert.     Cranial Nerves: No cranial nerve deficit.  Psychiatric:     Comments: tired     ED Results / Procedures / Treatments   Labs (all labs ordered are  listed, but only abnormal results are displayed) Labs Reviewed  BASIC METABOLIC PANEL - Abnormal; Notable for the following components:      Result Value   Glucose, Bld 157 (*)    All other components within normal limits  CBC - Abnormal; Notable for the following components:   RBC 5.85 (*)    Platelets 105 (*)    All other components within normal limits  CBG MONITORING, ED - Abnormal; Notable for the following components:   Glucose-Capillary 159 (*)    All other components within normal limits  CULTURE, BLOOD (ROUTINE X 2)  CULTURE, BLOOD (ROUTINE X 2)  LACTIC ACID, PLASMA  LACTIC ACID, PLASMA  PROTIME-INR  APTT  URINALYSIS, W/ REFLEX TO CULTURE (INFECTION SUSPECTED)    EKG EKG Interpretation  Date/Time:  Friday Sep 05 2022 11:04:07 EDT Ventricular Rate:  89 PR Interval:  196 QRS Duration: 104 QT Interval:  350 QTC Calculation: 426 R Axis:   -44 Text Interpretation: Sinus rhythm Incomplete RBBB and LAFB Low voltage, precordial leads Consider anterior infarct Confirmed by Blane Ohara [phone]) on 09/05/2022 11:07:34 AM  Radiology DG Toe 2nd Left  Result Date: 09/05/2022 CLINICAL DATA:  Right foot wound. EXAM: LEFT SECOND TOE COMPARISON:  January 25, 2022. FINDINGS: There is no evidence of fracture or dislocation. There is no evidence of arthropathy or other focal bone abnormality. Soft tissues are unremarkable. IMPRESSION: Negative. Electronically Signed   By: Lupita Raider M.D.    On: 09/05/2022 12:26   DG Foot 2 Views Right  Result Date: 09/05/2022 CLINICAL DATA:  Midfoot ulcer. EXAM: RIGHT FOOT - 2 VIEW COMPARISON:  09/17/2021 FINDINGS: Two views study shows no definite fracture or dislocation. Advanced arthropathy again noted in the midfoot, similar to prior. No gross bony destruction to suggest osteomyelitis. IMPRESSION: Advanced arthropathy in the midfoot, similar to prior. No gross bony destruction to suggest osteomyelitis. MRI of the foot with and without contrast could be used for more sensitive assessment as clinically warranted. Electronically Signed   By: Kennith Center M.D.   On: 09/05/2022 12:23   DG Chest Portable 1 View  Result Date: 09/05/2022 CLINICAL DATA:  Weakness EXAM: PORTABLE CHEST 1 VIEW COMPARISON:  CXR 01/25/22 FINDINGS: No pleural effusion. No pneumothorax. Unchanged cardiac and mediastinal contours mild cardiomegaly. Hazy opacity at the left lung base is favored represent atelectasis, but infection is not excluded. No radiographically apparent displaced rib fractures. Visualized upper abdomen is unremarkable. IMPRESSION: Cardiomegaly and left basilar atelectasis. Electronically Signed   By: Lorenza Cambridge M.D.   On: 09/05/2022 12:23    Procedures .Critical Care  Performed by: Blane Ohara, MD Authorized by: Blane Ohara, MD   Critical care provider statement:    Critical care time (minutes):  79   Critical care start time:  09/05/2022 11:30 PM   Critical care end time:  09/05/2022 12:49 PM   Critical care time was exclusive of:  Separately billable procedures and treating other patients and teaching time   Critical care was necessary to treat or prevent imminent or life-threatening deterioration of the following conditions:  Sepsis   Critical care was time spent personally by me on the following activities:  Evaluation of patient's response to treatment, ordering and review of laboratory studies, ordering and review of radiographic studies,  pulse oximetry and re-evaluation of patient's condition Ultrasound ED Echo  Date/Time: 09/05/2022 12:49 PM  Performed by: Blane Ohara, MD Authorized by: Blane Ohara, MD   Procedure details:    Indications:  dyspnea     Views: subxiphoid, parasternal long axis view, parasternal short axis view, apical 4 chamber view and IVC view     Images: archived     Limitations:  Body habitus   {Document cardiac monitor, telemetry assessment procedure when appropriate:1}  Medications Ordered in ED Medications  ceFEPIme (MAXIPIME) 2 g in sodium chloride 0.9 % 100 mL IVPB (has no administration in time range)  metroNIDAZOLE (FLAGYL) IVPB 500 mg (has no administration in time range)  vancomycin (VANCOCIN) IVPB 1000 mg/200 mL premix (has no administration in time range)    And  vancomycin (VANCOCIN) IVPB 1000 mg/200 mL premix (has no administration in time range)  vancomycin (VANCOREADY) IVPB 1750 mg/350 mL (has no administration in time range)  sodium chloride 0.9 % bolus 1,000 mL (1,000 mLs Intravenous New Bag/Given 09/05/22 1230)  acetaminophen (TYLENOL) tablet 1,000 mg (1,000 mg Oral Given 09/05/22 1244)    ED Course/ Medical Decision Making/ A&P   {   Click here for ABCD2, HEART and other calculatorsREFRESH Note before signing :1}                          Medical Decision Making Amount and/or Complexity of Data Reviewed Labs: ordered. Radiology: ordered.  Risk OTC drugs. Prescription drug management.   Patient presents with general weakness nausea and lightheadedness with mild tachypnea.  Nonfocal neuroexam, fatigue on exam.  Broad differential including wound infection from foot ulcers, pneumonia given mild hypoxia however no significant cough, metabolic, dehydration, cardiac, urine infection, other.  Blood work ordered initially independently reviewed showing normal white count, normal hemoglobin, electrolytes unremarkable.  Glucose mild elevated 157, mild low platelets  105.  Rectal temp ordered and revealed 103 temperature, Tylenol ordered, blood cultures, sepsis screening, lactic acid added.  IV fluid bolus given.  Patient requiring 2 L nasal cannula.  Discussed broad IV antibiotics with pharmacy in the ED.  X-rays ordered independently reviewed not sensitive for osteo but no evidence based on x-ray.  Chest x-ray cardiomegaly, bedside ultrasound will be performed.    {Document critical care time when appropriate:1} {Document review of labs and clinical decision tools ie heart score, Chads2Vasc2 etc:1}  {Document your independent review of radiology images, and any outside records:1} {Document your discussion with family members, caretakers, and with consultants:1} {Document social determinants of health affecting pt's care:1} {Document your decision making why or why not admission, treatments were needed:1} Final Clinical Impression(s) / ED Diagnoses Final diagnoses:  Foot ulcer due to secondary DM (HCC)  Sepsis due to skin infection (HCC)  Hypoxia  Cardiomegaly    Rx / DC Orders ED Discharge Orders     None

## 2022-09-05 NOTE — Progress Notes (Signed)
Pharmacy Antibiotic Note  [name] is a 67 y.o. male admitted on 09/05/2022 with  wound infection . Pharmacy has been consulted for vancomycin and cefepime dosing. Pt is febrile with Tmax 103.3 and WBC is WNL. SCr is at baseline. He was started on bactrim yesterday.   Plan: Vancomycin 2g IV x 1 then 1750mg  IV Q24H  Cefepime 2g IV Q8H F/u renal fxn, C&S, clinical status and peak/trough at SS  Height: 5\' 7"  (170.2 cm) Weight: 112.5 kg (248 lb) IBW/kg (Calculated) : 66.1  Temp (24hrs), Avg:100.6 F (38.1 C), Min:99.1 F (37.3 C), Max:103.3 F (39.6 C)  Recent Labs  Lab 09/05/22 1122  WBC 7.5  CREATININE 1.02    Estimated Creatinine Clearance: 85.3 mL/min (by C-G formula based on SCr of 1.02 mg/dL).    Allergies  Allergen Reactions   Metformin Other (See Comments)   Penicillins Rash    Has patient had a PCN reaction causing immediate rash, facial/tongue/throat swelling, SOB or lightheadedness with hypotension: Yes Has patient had a PCN reaction causing severe rash involving mucus membranes or skin necrosis: Unk Has patient had a PCN reaction that required hospitalization: Unk Has patient had a PCN reaction occurring within the last 10 years: No If all of the above answers are "NO", then may proceed with Cephalosporin use.     Antimicrobials this admission: Vanc 5/24>> Cefepime 5/24>> Flagyl x 1 5/24  Dose adjustments this admission: N/A  Microbiology results: Pending  Thank you for allowing pharmacy to be a part of this patient's care.  Violette Morneault, Drake Leach 09/05/2022 12:51 PM

## 2022-09-06 DIAGNOSIS — L089 Local infection of the skin and subcutaneous tissue, unspecified: Secondary | ICD-10-CM | POA: Diagnosis not present

## 2022-09-06 DIAGNOSIS — L97509 Non-pressure chronic ulcer of other part of unspecified foot with unspecified severity: Secondary | ICD-10-CM | POA: Diagnosis not present

## 2022-09-06 DIAGNOSIS — A419 Sepsis, unspecified organism: Secondary | ICD-10-CM | POA: Diagnosis not present

## 2022-09-06 DIAGNOSIS — E13621 Other specified diabetes mellitus with foot ulcer: Secondary | ICD-10-CM | POA: Diagnosis not present

## 2022-09-06 LAB — COMPREHENSIVE METABOLIC PANEL
ALT: 21 U/L (ref 0–44)
AST: 26 U/L (ref 15–41)
Albumin: 2.9 g/dL — ABNORMAL LOW (ref 3.5–5.0)
Alkaline Phosphatase: 58 U/L (ref 38–126)
Anion gap: 15 (ref 5–15)
BUN: 14 mg/dL (ref 8–23)
CO2: 22 mmol/L (ref 22–32)
Calcium: 8.3 mg/dL — ABNORMAL LOW (ref 8.9–10.3)
Chloride: 100 mmol/L (ref 98–111)
Creatinine, Ser: 1.22 mg/dL (ref 0.61–1.24)
GFR, Estimated: >60 mL/min (ref >60)
Glucose, Bld: 113 mg/dL — ABNORMAL HIGH (ref 70–99)
Potassium: 3.2 mmol/L — ABNORMAL LOW (ref 3.5–5.1)
Sodium: 137 mmol/L (ref 135–145)
Total Bilirubin: 1 mg/dL (ref 0.3–1.2)
Total Protein: 5.9 g/dL — ABNORMAL LOW (ref 6.5–8.1)

## 2022-09-06 LAB — GLUCOSE, CAPILLARY
Glucose-Capillary: 117 mg/dL — ABNORMAL HIGH (ref 70–99)
Glucose-Capillary: 129 mg/dL — ABNORMAL HIGH (ref 70–99)
Glucose-Capillary: 119 mg/dL — ABNORMAL HIGH (ref 70–99)
Glucose-Capillary: 145 mg/dL — ABNORMAL HIGH (ref 70–99)

## 2022-09-06 LAB — CBC
HCT: 44.3 % (ref 39.0–52.0)
Hemoglobin: 14.3 g/dL (ref 13.0–17.0)
MCH: 28.7 pg (ref 26.0–34.0)
MCHC: 32.3 g/dL (ref 30.0–36.0)
MCV: 88.8 fL (ref 80.0–100.0)
Platelets: 107 10*3/uL — ABNORMAL LOW (ref 150–400)
RBC: 4.99 MIL/uL (ref 4.22–5.81)
RDW: 14.6 % (ref 11.5–15.5)
WBC: 9.7 10*3/uL (ref 4.0–10.5)
nRBC: 0 % (ref 0.0–0.2)

## 2022-09-06 LAB — CULTURE, BLOOD (ROUTINE X 2)

## 2022-09-06 LAB — SEDIMENTATION RATE: Sed Rate: 4 mm/hr (ref 0–16)

## 2022-09-06 LAB — C-REACTIVE PROTEIN: CRP: 12 mg/dL — ABNORMAL HIGH (ref <1.0)

## 2022-09-06 MED ORDER — METOPROLOL SUCCINATE ER 50 MG PO TB24
50.0000 mg | ORAL_TABLET | Freq: Every day | ORAL | Status: DC
  Administered 2022-09-06 – 2022-09-07 (×2): 50 mg via ORAL
  Filled 2022-09-06 (×2): qty 1

## 2022-09-06 MED ORDER — POTASSIUM CHLORIDE CRYS ER 20 MEQ PO TBCR
40.0000 meq | EXTENDED_RELEASE_TABLET | ORAL | Status: AC
  Administered 2022-09-06 (×2): 40 meq via ORAL
  Filled 2022-09-06 (×2): qty 2

## 2022-09-06 MED ORDER — ISOSORBIDE MONONITRATE ER 60 MG PO TB24
120.0000 mg | ORAL_TABLET | Freq: Every day | ORAL | Status: DC
  Administered 2022-09-06 – 2022-09-07 (×2): 120 mg via ORAL
  Filled 2022-09-06 (×2): qty 2

## 2022-09-06 MED ORDER — TAMSULOSIN HCL 0.4 MG PO CAPS
0.4000 mg | ORAL_CAPSULE | Freq: Every day | ORAL | Status: DC
  Administered 2022-09-06 – 2022-09-12 (×7): 0.4 mg via ORAL
  Filled 2022-09-06 (×7): qty 1

## 2022-09-06 MED ORDER — SULFAMETHOXAZOLE-TRIMETHOPRIM 800-160 MG PO TABS
2.0000 | ORAL_TABLET | Freq: Two times a day (BID) | ORAL | Status: DC
  Administered 2022-09-06 – 2022-09-07 (×3): 2 via ORAL
  Filled 2022-09-06 (×5): qty 2

## 2022-09-06 NOTE — Plan of Care (Signed)

## 2022-09-06 NOTE — Consult Note (Signed)
WOC Nurse Consult Note: Reason for Consult: WOC Nurse is consulted simultaneously with Podiatric Medicine and Dr. [name] saw patient this morning. Patient is following in the community by Dr. Vara Guardian associate, Dr. Ardelle Anton. Wound type:neuropathic Pressure Injury POA: N/A Dressing procedure/placement/frequency:Dr. [name] has recommended daily wound care with dry dressings. I have transcribed that POC for Nursing via the Orders.   Recommend patient follow up with Dr. Ardelle Anton in the office following discharge for continuing oversight and efforts directed toward limb salvage by that Provider.  WOC nursing team will not follow, but will remain available to this patient, the nursing and medical teams.  Please re-consult if needed.  Thank you for inviting [place] to participate in this patient's Plan of Care.  Ladona Mow, MSN, RN, CNS, GNP, Leda Min, [employer], [employer] phone:  [phone]

## 2022-09-06 NOTE — Consult Note (Addendum)
Reason for Consult:fevers, cellulitis of toe Referring Physician: Dr Williemae Area [name] is an 67 y.o. male.  HPI: Patient is known to our practice for ongoing ulcerations of both feet, most notably the left 2nd toe. He was seen by my partner Dr Ardelle Anton on 5/23, did have some erythema and was prescribed bactrim. He had one dose of this, overnight developed fevers, nausea/vomiting and chills. He had a fever of 103 as well. Denied any CP/SOB, diarrhea, urinary symptoms. Was satting in mid 55s at [facility] ER. No O2 requirement now. Today he is in bed resting comfortably, having neuropathy pain but not any bony pain in foot.  Past Medical History:  Diagnosis Date   Arthritis    "ankles, knees" (01/29/2018)   CAD in native artery    cath report from 02/2017 indicates patient had a history of RCA stent >10 years prior to that. At time of that cath he was found to have EF 70%, 20-30% ostial LAD, 30-40% prox LAD, 30-40% Cx, 80-90% Cx beyond OM1, totally occluded RCA with left-to-right collaterals.   Cellulitis 03/03/2022   Diabetes mellitus type 2 in obese    GERD (gastroesophageal reflux disease)    Hyperlipidemia    Hypertension    Morbid obesity (HCC)    Onychomycosis 03/03/2022   OSA on CPAP     Past Surgical History:  Procedure Laterality Date   CARDIAC CATHETERIZATION  2008   CORONARY ANGIOPLASTY WITH STENT PLACEMENT  1997   JOINT REPLACEMENT     KNEE ARTHROSCOPY Bilateral    "meniscus repairs"   TONSILLECTOMY     TOTAL KNEE ARTHROPLASTY Left 01/29/2018   TOTAL KNEE ARTHROPLASTY Left 01/29/2018   Procedure: LEFT TOTAL KNEE ARTHROPLASTY;  Surgeon: Tarry Kos, MD;  Location: [facility] OR;  Service: Orthopedics;  Laterality: Left;   UVULOPALATOPHARYNGOPLASTY  ~ 1994    Family History  Problem Relation Age of Onset   Heart attack Mother    Heart attack Father     Social History:  reports that he has never smoked. He has never been exposed to tobacco smoke. He has never used  smokeless tobacco. He reports current alcohol use. He reports that he does not use drugs.  Allergies:  Allergies  Allergen Reactions   Metformin Other (See Comments)   Penicillins Rash    Has patient had a PCN reaction causing immediate rash, facial/tongue/throat swelling, SOB or lightheadedness with hypotension: Yes Has patient had a PCN reaction causing severe rash involving mucus membranes or skin necrosis: Unk Has patient had a PCN reaction that required hospitalization: Unk Has patient had a PCN reaction occurring within the last 10 years: No If all of the above answers are "NO", then may proceed with Cephalosporin use.     Medications: I have reviewed the patient's current medications.  Results for orders placed or performed during the hospital encounter of 09/05/22 (from the past 48 hour(s))  CBG monitoring, ED     Status: Abnormal   Collection Time: 09/05/22 11:15 AM  Result Value Ref Range   Glucose-Capillary 159 (H) 70 - 99 mg/dL    Comment: Glucose reference range applies only to samples taken after fasting for at least 8 hours.  Basic metabolic panel     Status: Abnormal   Collection Time: 09/05/22 11:22 AM  Result Value Ref Range   Sodium 138 135 - 145 mmol/L   Potassium 3.7 3.5 - 5.1 mmol/L   Chloride 102 98 - 111 mmol/L   CO2  24 22 - 32 mmol/L   Glucose, Bld 157 (H) 70 - 99 mg/dL    Comment: Glucose reference range applies only to samples taken after fasting for at least 8 hours.   BUN 17 8 - 23 mg/dL   Creatinine, Ser 9.14 0.61 - 1.24 mg/dL   Calcium 9.2 8.9 - 78.2 mg/dL   GFR, Estimated >95 >62 mL/min    Comment: (NOTE) Calculated using the CKD-EPI Creatinine Equation (2021)    Anion gap 12 5 - 15    Comment: Performed at [facility], [address], [place], [place] [address]  CBC     Status: Abnormal   Collection Time: 09/05/22 11:22 AM  Result Value Ref Range   WBC 7.5 4.0 - 10.5 K/uL   RBC 5.85 (H) 4.22 - 5.81 MIL/uL   Hemoglobin  16.9 13.0 - 17.0 g/dL   HCT 57.8 46.9 - 62.9 %   MCV 86.3 80.0 - 100.0 fL   MCH 28.9 26.0 - 34.0 pg   MCHC 33.5 30.0 - 36.0 g/dL   RDW 52.8 41.3 - 24.4 %   Platelets 105 (L) 150 - 400 K/uL   nRBC 0.0 0.0 - 0.2 %    Comment: Performed at [facility], [address] [place], [place] [address]  Lactic acid, plasma     Status: Abnormal   Collection Time: 09/05/22 12:37 PM  Result Value Ref Range   Lactic Acid, Venous 2.1 (HH) 0.5 - 1.9 mmol/L    Comment: CRITICAL RESULT CALLED TO, READ BACK BY AND VERIFIED WITH: MADISON FOUNTAIN,RN AT 1333 ON 09/05/22 Performed at [facility], [address] [place], [place] [address]   Protime-INR     Status: None   Collection Time: 09/05/22 12:37 PM  Result Value Ref Range   Prothrombin Time 14.3 11.4 - 15.2 seconds   INR 1.1 0.8 - 1.2    Comment: (NOTE) INR goal varies based on device and disease states. Performed at [facility], [address], [place], [place] [address]   APTT     Status: None   Collection Time: 09/05/22 12:37 PM  Result Value Ref Range   aPTT 31 24 - 36 seconds    Comment: Performed at [facility], [address] [place], [place] [address]  Blood Culture (routine x 2)     Status: None (Preliminary result)   Collection Time: 09/05/22 12:52 PM   Specimen: BLOOD LEFT FOREARM  Result Value Ref Range   Specimen Description      BLOOD LEFT FOREARM Performed at [facility], [address] [place], [place] [address]    Special Requests      BOTTLES DRAWN AEROBIC AND ANAEROBIC Blood Culture adequate volume Performed at [facility], [address] [place], [place] [address]    Culture PENDING    Report Status PENDING   Blood Culture (routine x 2)     Status: None (Preliminary result)   Collection Time: 09/05/22 12:57 PM   Specimen: BLOOD  Result Value Ref Range   Specimen Description      BLOOD BLOOD RIGHT  WRIST Performed at [facility], [address], [place], [place] [address]    Special Requests      BOTTLES DRAWN AEROBIC AND ANAEROBIC Blood Culture adequate volume Performed at [facility], [address] [place], [place] [address]    Culture      NO GROWTH < 24 HOURS Performed at [facility], [address] [place], [place] [address]    Report Status PENDING   SARS Coronavirus 2 by RT PCR (hospital order, performed in [facility] hospital lab) *cepheid single result test* Anterior Nasal Swab     Status: None   Collection Time: 09/05/22  2:19 PM   Specimen: Anterior Nasal Swab  Result Value Ref Range   SARS Coronavirus 2 by RT PCR NEGATIVE NEGATIVE    Comment: (NOTE) SARS-CoV-2 target nucleic acids are NOT DETECTED.  The SARS-CoV-2 RNA is generally detectable in upper and lower respiratory specimens during the acute phase of infection. The lowest concentration of SARS-CoV-2 viral copies this assay can detect is 250 copies / mL. A negative result does not preclude SARS-CoV-2 infection and should not be used as the sole basis for treatment or other patient management decisions.  A negative result may occur with improper specimen collection / handling, submission of specimen other than nasopharyngeal swab, presence of viral mutation(s) within the areas targeted by this assay, and inadequate number of viral copies (<250 copies / mL). A negative result must be combined with clinical observations, patient history, and epidemiological information.  Fact Sheet for Patients:   RoadLapTop.co.za  Fact Sheet for Healthcare Providers: http://kim-miller.com/  This test is not yet approved or  cleared by the [place] FDA and has been authorized for detection and/or diagnosis of SARS-CoV-2 by FDA under an Emergency Use Authorization (EUA).  This EUA will remain in effect (meaning this test  can be used) for the duration of the COVID-19 declaration under Section 564(b)(1) of the Act, 21 U.S.C. section 360bbb-3(b)(1), unless the authorization is terminated or revoked sooner.  Performed at [facility], [address], [place], [place] [address]   Lactic acid, plasma     Status: Abnormal   Collection Time: 09/05/22  2:55 PM  Result Value Ref Range   Lactic Acid, Venous 2.1 (HH) 0.5 - 1.9 mmol/L    Comment: CRITICAL RESULT CALLED TO, READ BACK BY AND VERIFIED WITH: MADISON FOUNTAIN,RN AT 1535 ON 09/05/22  Performed at [facility], [address], [place], [place] [address]   Urinalysis, w/ Reflex to Culture (Infection Suspected) -Urine, Clean Catch     Status: Abnormal   Collection Time: 09/05/22  4:45 PM  Result Value Ref Range   Specimen Source URINE, CLEAN CATCH    Color, Urine YELLOW YELLOW   APPearance CLEAR CLEAR   Specific Gravity, Urine 1.025 1.005 - 1.030   pH 5.0 5.0 - 8.0   Glucose, UA >=500 (A) NEGATIVE mg/dL   Hgb urine dipstick NEGATIVE NEGATIVE   Bilirubin Urine NEGATIVE NEGATIVE   Ketones, ur NEGATIVE NEGATIVE mg/dL   Protein, ur NEGATIVE NEGATIVE mg/dL   Nitrite NEGATIVE NEGATIVE   Leukocytes,Ua NEGATIVE NEGATIVE   RBC / HPF 0-5 0 - 5 RBC/hpf   WBC, UA 0-5 0 - 5 WBC/hpf    Comment:        Reflex urine culture not performed if WBC <=10, OR if Squamous epithelial cells >5. If Squamous epithelial cells >5 suggest recollection.    Bacteria, UA NONE SEEN NONE SEEN   Squamous Epithelial / HPF 0-5 0 - 5 /HPF   Mucus PRESENT     Comment: Performed at [facility], [address], [place], [place] [address]  Glucose, capillary     Status: Abnormal   Collection Time: 09/05/22  5:01 PM  Result Value Ref Range   Glucose-Capillary 144 (H) 70 - 99  mg/dL    Comment: Glucose reference range applies only to samples taken after fasting for at least 8 hours.  Creatinine, serum     Status: Abnormal   Collection  Time: 09/05/22  6:18 PM  Result Value Ref Range   Creatinine, Ser 1.30 (H) 0.61 - 1.24 mg/dL   GFR, Estimated >11 >91 mL/min    Comment: (NOTE) Calculated using the CKD-EPI Creatinine Equation (2021) Performed at [facility], [address], [place], [place] [address]   CBC     Status: Abnormal   Collection Time: 09/05/22  7:46 PM  Result Value Ref Range   WBC 10.4 4.0 - 10.5 K/uL   RBC 5.34 4.22 - 5.81 MIL/uL   Hemoglobin 15.1 13.0 - 17.0 g/dL   HCT 56.2 13.0 - 86.5 %   MCV 86.3 80.0 - 100.0 fL   MCH 28.3 26.0 - 34.0 pg   MCHC 32.8 30.0 - 36.0 g/dL   RDW 78.4 69.6 - 29.5 %   Platelets 108 (L) 150 - 400 K/uL   nRBC 0.0 0.0 - 0.2 %    Comment: Performed at [facility], [address] [place], [place] [address]  Glucose, capillary     Status: Abnormal   Collection Time: 09/05/22  8:40 PM  Result Value Ref Range   Glucose-Capillary 148 (H) 70 - 99 mg/dL    Comment: Glucose reference range applies only to samples taken after fasting for at least 8 hours.   Comment 1 Notify RN    Comment 2 Document in Chart   CBC     Status: Abnormal   Collection Time: 09/06/22 12:13 AM  Result Value Ref Range   WBC 9.7 4.0 - 10.5 K/uL   RBC 4.99 4.22 - 5.81 MIL/uL   Hemoglobin 14.3 13.0 - 17.0 g/dL   HCT 24.4 01.0 - 27.2 %   MCV 88.8 80.0 - 100.0 fL   MCH 28.7 26.0 - 34.0 pg   MCHC 32.3 30.0 - 36.0 g/dL   RDW 53.6 64.4 - 03.4 %   Platelets 107 (L) 150 - 400 K/uL   nRBC 0.0 0.0 - 0.2 %    Comment: Performed at [facility], [address], [place], [place] [address]  Comprehensive metabolic panel     Status: Abnormal   Collection Time: 09/06/22 12:13 AM  Result Value Ref Range   Sodium 137 135 - 145 mmol/L   Potassium 3.2 (L) 3.5 - 5.1 mmol/L   Chloride 100 98 - 111 mmol/L   CO2 22 22 - 32 mmol/L   Glucose, Bld 113 (H) 70 - 99 mg/dL    Comment: Glucose reference range applies only to samples taken after fasting for at least 8 hours.   BUN 14 8 - 23 mg/dL    Creatinine, Ser 5.63 0.61 - 1.24 mg/dL   Calcium 8.3 (L) 8.9 - 10.3 mg/dL   Total Protein 5.9 (L) 6.5 - 8.1 g/dL   Albumin 2.9 (L) 3.5 - 5.0 g/dL   [name] 26 15 - 41 U/L   ALT 21 0 - 44 U/L   Alkaline Phosphatase 58 38 - 126 U/L   Total Bilirubin 1.0 0.3 - 1.2 mg/dL   GFR, Estimated >87 >56 mL/min    Comment: (NOTE) Calculated using the CKD-EPI Creatinine Equation (2021)    Anion gap 15 5 - 15    Comment: Performed at [facility], [address] [place], [place] [address]  C-reactive protein  Status: Abnormal   Collection Time: 09/06/22 12:13 AM  Result Value Ref Range   CRP 12.0 (H) <1.0 mg/dL    Comment: Performed at [facility], [address], [place], [place] [address]  Sedimentation rate     Status: None   Collection Time: 09/06/22 12:13 AM  Result Value Ref Range   Sed Rate 4 0 - 16 mm/hr    Comment: Performed at [facility], [address], [place], [place] [address]  Glucose, capillary     Status: Abnormal   Collection Time: 09/06/22  6:20 AM  Result Value Ref Range   Glucose-Capillary 117 (H) 70 - 99 mg/dL    Comment: Glucose reference range applies only to samples taken after fasting for at least 8 hours.   Comment 1 Notify RN    Comment 2 Document in Chart     DG Toe 2nd Left  Result Date: 09/05/2022 CLINICAL DATA:  Right foot wound. EXAM: LEFT SECOND TOE COMPARISON:  January 25, 2022. FINDINGS: There is no evidence of fracture or dislocation. There is no evidence of arthropathy or other focal bone abnormality. Soft tissues are unremarkable. IMPRESSION: Negative. Electronically Signed   By: Lupita Raider M.D.   On: 09/05/2022 12:26   DG Foot 2 Views Right  Result Date: 09/05/2022 CLINICAL DATA:  Midfoot ulcer. EXAM: RIGHT FOOT - 2 VIEW COMPARISON:  09/17/2021 FINDINGS: Two views study shows no definite fracture or dislocation. Advanced arthropathy again noted in the midfoot, similar to prior. No gross bony destruction to suggest  osteomyelitis. IMPRESSION: Advanced arthropathy in the midfoot, similar to prior. No gross bony destruction to suggest osteomyelitis. MRI of the foot with and without contrast could be used for more sensitive assessment as clinically warranted. Electronically Signed   By: Kennith Center M.D.   On: 09/05/2022 12:23   DG Chest Portable 1 View  Result Date: 09/05/2022 CLINICAL DATA:  Weakness EXAM: PORTABLE CHEST 1 VIEW COMPARISON:  CXR 01/25/22 FINDINGS: No pleural effusion. No pneumothorax. Unchanged cardiac and mediastinal contours mild cardiomegaly. Hazy opacity at the left lung base is favored represent atelectasis, but infection is not excluded. No radiographically apparent displaced rib fractures. Visualized upper abdomen is unremarkable. IMPRESSION: Cardiomegaly and left basilar atelectasis. Electronically Signed   By: Lorenza Cambridge M.D.   On: 09/05/2022 12:23    Review of Systems  Constitutional:  Positive for chills and fever.  Respiratory:  Negative for shortness of breath.   Cardiovascular:  Negative for chest pain.  Gastrointestinal:  Positive for nausea and vomiting. Negative for diarrhea.  Genitourinary:  Negative for dysuria.   Blood pressure 107/69, pulse 68, temperature 98.5 F (36.9 C), temperature source Oral, resp. rate 17, height 5\' 8"  (1.727 m), weight 112.5 kg, SpO2 96 %.  Vitals:   09/06/22 0626 09/06/22 0712  BP:  107/69  Pulse:  68  Resp:  17  Temp: 98.5 F (36.9 C) 98.5 F (36.9 C)  SpO2:  96%    General AA&O x3. Normal mood and affect.  Vascular Dorsalis pedis and posterior tibial pulses  present 2+ bilaterally  Capillary refill normal to all digits. Pedal hair growth diminished.  Neurologic Epicritic sensation grossly  reduced .  Dermatologic (Wound) Full thickness granular ulcer of left 2nd toe with serous drainage, does not probe to bone, no purulence, periwound erythema but no ascending cellulitis. Partial thickness ulcer R midfoot, stable w/o erythema   Orthopedic: Motor intact BLE.  Assessment/Plan:  Left foot infection, fevers -Imaging: Studies independently reviewed. Agree w/ MRI for left foot -Antibiotics: continue broad spectrum for diabetic foot infection and sepsis -His exam is fairly benign and I am surprised the appearance of the foot would cause a 103 fever. ESR is normal, mild elevation in CRP and no leukocytosis on admission. Would continue to evaluate other sources for his symptoms, certainly if there is osteomyelitis this could cause transient bacteremia and make him febrile. Will f/u after MRI -WB Status: WBAT -Wound Care: dress with dry gauze, new dressing applied today -Surgical Plan: no definite plans, pending MRI results   Edwin Cap 09/06/2022, 10:09 AM   Best available via secure chat for questions or concerns.

## 2022-09-06 NOTE — Progress Notes (Signed)
PROGRESS NOTE    [name]  ZOX:[id number] DOB: [birth year] DOA: 09/05/2022 PCP: Emilio Aspen, MD   Brief Narrative:  [name] is a 67 y.o. male with medical history significant of  CAD s/p PCI, DM-2, HTN, HLD, peripheral neuropathy-who [facility] for evaluation of fever or chills.  While in the ED, patient had transient hypotension which resolved with IV fluid bolus.  Patient was eventually admitted to hospitalist service with a diagnosis of sepsis presumed to be secondary to diabetic foot ulcer vs osteomyelitis.  Patient received antibiotics in the ED.  Assessment & Plan:   Principal Problem:   Sepsis (HCC) Active Problems:   CAD (coronary artery disease), native coronary artery   Essential hypertension   Hyperlipidemia   Polyneuropathy due to type 2 diabetes mellitus (HCC)   DM (diabetes mellitus) (HCC)  Sepsis, presumed to be secondary to left great toe and second toe diabetic foot infection vs osteomyelitis,/left foot cellulitis POA: Patient met sepsis criteria based on fever 102.3 and tachypnea.  Last temperature spike at 3 AM 09/06/2022.  On examination, he appears to have some erythema and swelling of the left foot.  Awaiting MRI left foot to be completed.  May consult podiatry based on the results.  Consult wound care.  He was evaluated by podiatry on 09/04/2022 and due to infection/cellulitis, started on Bactrim DS.  I will resume that medication for now.  Transient hypoxia: Was noted to be hypoxic in the ED.  Currently on 2 L but saturation 96%.  He is placed on oxygen for comfort only.  He is comfortable and stable.  Hypokalemia: Will replace.  History of CAD s/p remote PCI: Asymptomatic.  Continue aspirin and statin.  Type 2 diabetes mellitus: Last hemoglobin A1c October 2023 was 7.4.  Hold oral hypoglycemics.  Continue SSI.  Check hemoglobin A1c.  History of essential hypertension: Blood pressure better and improving but still on  the low normal side so we will continue to hold antihypertensives however I will resume his Toprol-XL to prevent rebound tachycardia.  Will resume at half dose which will be 50 mg p.o. daily.  Hyperlipidemia: Statin.  Peripheral neuropathy: Continue Lyrica.  DVT prophylaxis: enoxaparin (LOVENOX) injection 40 mg Start: 09/05/22 1745   Code Status: Full Code  Family Communication: Caretaker present at bedside.  Plan of care discussed with patient in length and he/she verbalized understanding and agreed with it.  Status is: Inpatient Remains inpatient appropriate because: Pending MRI and may need further evaluation by podiatry.   Estimated body mass index is 37.71 kg/m as calculated from the following:   Height as of this encounter: 5\' 8"  (1.727 m).   Weight as of this encounter: 112.5 kg.    Nutritional Assessment: Body mass index is 37.71 kg/m.Marland Kitchen Seen by dietician.  I agree with the assessment and plan as outlined below: Nutrition Status:        . Skin Assessment: I have examined the patient's skin and I agree with the wound assessment as performed by the wound care RN as outlined below:    Consultants:  None  Procedures:  None  Antimicrobials:  Anti-infectives (From admission, onward)    Start     Dose/Rate Route Frequency Ordered Stop   09/06/22 1500  vancomycin (VANCOREADY) IVPB 1750 mg/350 mL  Status:  Discontinued        1,750 mg 175 mL/hr over 120 Minutes Intravenous Every 24 hours 09/05/22 1250 09/05/22 1717   09/05/22 1400  vancomycin (VANCOCIN) IVPB 1000 mg/200 mL premix       See Hyperspace for full Linked Orders Report.   1,000 mg 200 mL/hr over 60 Minutes Intravenous  Once 09/05/22 1247 09/05/22 1601   09/05/22 1300  ceFEPIme (MAXIPIME) 2 g in sodium chloride 0.9 % 100 mL IVPB  Status:  Discontinued        2 g 200 mL/hr over 30 Minutes Intravenous Every 8 hours 09/05/22 1247 09/05/22 1649   09/05/22 1300  metroNIDAZOLE (FLAGYL) IVPB 500 mg        500  mg 100 mL/hr over 60 Minutes Intravenous  Once 09/05/22 1247 09/05/22 1456   09/05/22 1300  vancomycin (VANCOCIN) IVPB 1000 mg/200 mL premix       See Hyperspace for full Linked Orders Report.   1,000 mg 200 mL/hr over 60 Minutes Intravenous  Once 09/05/22 1247 09/05/22 1443         Subjective: Seen and examined.  He says that he feels better.  He has no complaints.  Tells me that due to neuropathy, he does not feel much in the feet.  Caretaker at the bedside.  Objective: Vitals:   09/05/22 2033 09/05/22 2318 09/06/22 0306 09/06/22 0626  BP:  104/60 117/64   Pulse:  76 75   Resp:  20 19   Temp: (!) 102.3 F (39.1 C) 100 F (37.8 C) (!) 101.8 F (38.8 C) 98.5 F (36.9 C)  TempSrc: Oral Oral Axillary Oral  SpO2:  92% 91%   Weight:      Height:        Intake/Output Summary (Last 24 hours) at 09/06/2022 1610 Last data filed at 09/06/2022 0300 Gross per 24 hour  Intake 2126.2 ml  Output 750 ml  Net 1376.2 ml   Filed Weights   09/05/22 1055 09/05/22 1607  Weight: 112.5 kg 112.5 kg    Examination:  General exam: Appears calm and comfortable  Respiratory system: Clear to auscultation. Respiratory effort normal. Cardiovascular system: S1 & S2 heard, RRR. No JVD, murmurs, rubs, gallops or clicks. No pedal edema. Gastrointestinal system: Abdomen is nondistended, soft and nontender. No organomegaly or masses felt. Normal bowel sounds heard. Central nervous system: Alert and oriented. No focal neurological deficits. Extremities: Ulcer at the plantar surface of the right foot and ulcer at tip of the second toe and great toe of the left foot.  There are some erythema involving the left foot. Psychiatry: Judgement and insight appear normal. Mood & affect appropriate.    Data Reviewed: I have personally reviewed following labs and imaging studies  CBC: Recent Labs  Lab 09/05/22 1122 09/05/22 1946 09/06/22 0013  WBC 7.5 10.4 9.7  HGB 16.9 15.1 14.3  HCT 50.5 46.1 44.3   MCV 86.3 86.3 88.8  PLT 105* 108* 107*   Basic Metabolic Panel: Recent Labs  Lab 09/05/22 1122 09/05/22 1818 09/06/22 0013  NA 138  --  137  K 3.7  --  3.2*  CL 102  --  100  CO2 24  --  22  GLUCOSE 157*  --  113*  BUN 17  --  14  CREATININE 1.02 1.30* 1.22  CALCIUM 9.2  --  8.3*   GFR: Estimated Creatinine Clearance: 72.4 mL/min (by C-G formula based on SCr of 1.22 mg/dL). Liver Function Tests: Recent Labs  Lab 09/06/22 0013  [name] 26  ALT 21  ALKPHOS 58  BILITOT 1.0  PROT 5.9*  ALBUMIN 2.9*   No results for input(s): "LIPASE", "  AMYLASE" in the last 168 hours. No results for input(s): "AMMONIA" in the last 168 hours. Coagulation Profile: Recent Labs  Lab 09/05/22 1237  INR 1.1   Cardiac Enzymes: No results for input(s): "CKTOTAL", "CKMB", "CKMBINDEX", "TROPONINI" in the last 168 hours. BNP (last 3 results) No results for input(s): "PROBNP" in the last 8760 hours. HbA1C: No results for input(s): "HGBA1C" in the last 72 hours. CBG: Recent Labs  Lab 09/05/22 1115 09/05/22 1701 09/05/22 2040 09/06/22 0620  GLUCAP 159* 144* 148* 117*   Lipid Profile: No results for input(s): "CHOL", "HDL", "LDLCALC", "TRIG", "CHOLHDL", "LDLDIRECT" in the last 72 hours. Thyroid Function Tests: No results for input(s): "TSH", "T4TOTAL", "FREET4", "T3FREE", "THYROIDAB" in the last 72 hours. Anemia Panel: No results for input(s): "VITAMINB12", "FOLATE", "FERRITIN", "TIBC", "IRON", "RETICCTPCT" in the last 72 hours. Sepsis Labs: Recent Labs  Lab 09/05/22 1237 09/05/22 1455  LATICACIDVEN 2.1* 2.1*    Recent Results (from the past 240 hour(s))  SARS Coronavirus 2 by RT PCR (hospital order, performed in [facility] hospital lab) *cepheid single result test* Anterior Nasal Swab     Status: None   Collection Time: 09/05/22  2:19 PM   Specimen: Anterior Nasal Swab  Result Value Ref Range Status   SARS Coronavirus 2 by RT PCR NEGATIVE NEGATIVE Final    Comment:  (NOTE) SARS-CoV-2 target nucleic acids are NOT DETECTED.  The SARS-CoV-2 RNA is generally detectable in upper and lower respiratory specimens during the acute phase of infection. The lowest concentration of SARS-CoV-2 viral copies this assay can detect is 250 copies / mL. A negative result does not preclude SARS-CoV-2 infection and should not be used as the sole basis for treatment or other patient management decisions.  A negative result may occur with improper specimen collection / handling, submission of specimen other than nasopharyngeal swab, presence of viral mutation(s) within the areas targeted by this assay, and inadequate number of viral copies (<250 copies / mL). A negative result must be combined with clinical observations, patient history, and epidemiological information.  Fact Sheet for Patients:   RoadLapTop.co.za  Fact Sheet for Healthcare Providers: http://kim-miller.com/  This test is not yet approved or  cleared by the [place] FDA and has been authorized for detection and/or diagnosis of SARS-CoV-2 by FDA under an Emergency Use Authorization (EUA).  This EUA will remain in effect (meaning this test can be used) for the duration of the COVID-19 declaration under Section 564(b)(1) of the Act, 21 U.S.C. section 360bbb-3(b)(1), unless the authorization is terminated or revoked sooner.  Performed at [facility], [address], [place], [place] [address]      Radiology Studies: DG Toe 2nd Left  Result Date: 09/05/2022 CLINICAL DATA:  Right foot wound. EXAM: LEFT SECOND TOE COMPARISON:  January 25, 2022. FINDINGS: There is no evidence of fracture or dislocation. There is no evidence of arthropathy or other focal bone abnormality. Soft tissues are unremarkable. IMPRESSION: Negative. Electronically Signed   By: Lupita Raider M.D.   On: 09/05/2022 12:26   DG Foot 2 Views Right  Result Date:  09/05/2022 CLINICAL DATA:  Midfoot ulcer. EXAM: RIGHT FOOT - 2 VIEW COMPARISON:  09/17/2021 FINDINGS: Two views study shows no definite fracture or dislocation. Advanced arthropathy again noted in the midfoot, similar to prior. No gross bony destruction to suggest osteomyelitis. IMPRESSION: Advanced arthropathy in the midfoot, similar to prior. No gross bony destruction to suggest osteomyelitis. MRI of the foot with and without contrast could be used  for more sensitive assessment as clinically warranted. Electronically Signed   By: Kennith Center M.D.   On: 09/05/2022 12:23   DG Chest Portable 1 View  Result Date: 09/05/2022 CLINICAL DATA:  Weakness EXAM: PORTABLE CHEST 1 VIEW COMPARISON:  CXR 01/25/22 FINDINGS: No pleural effusion. No pneumothorax. Unchanged cardiac and mediastinal contours mild cardiomegaly. Hazy opacity at the left lung base is favored represent atelectasis, but infection is not excluded. No radiographically apparent displaced rib fractures. Visualized upper abdomen is unremarkable. IMPRESSION: Cardiomegaly and left basilar atelectasis. Electronically Signed   By: Lorenza Cambridge M.D.   On: 09/05/2022 12:23    Scheduled Meds:  aspirin EC  325 mg Oral QHS   atorvastatin  80 mg Oral QHS   enoxaparin (LOVENOX) injection  40 mg Subcutaneous Q24H   insulin aspart  0-9 Units Subcutaneous TID WC   pantoprazole  40 mg Oral Daily   potassium chloride  40 mEq Oral Q4H   pregabalin  150 mg Oral BID   Continuous Infusions:  sodium chloride 75 mL/hr at 09/06/22 0330     LOS: 1 day   Hughie Closs, MD [facility]  09/06/2022, 7:12 AM   *Please note that this is a verbal dictation therefore any spelling or grammatical errors are due to the "Dragon [employer] One" system interpretation.  Please page via Amion and do not message via secure chat for urgent patient care matters. Secure chat can be used for non urgent patient care matters.  How to contact the [facility] Attending or Consulting  provider 7A - 7P or covering provider during after hours 7P -7A, for this patient?  Check the care team in [facility] and look for a) attending/consulting [facility] provider listed and b) the [facility] team listed. Page or secure chat 7A-7P. Log into [email or web address] and use [place]'s universal password to access. If you do not have the password, please contact the hospital operator. Locate the [facility] provider you are looking for under [facility] and page to a number that you can be directly reached. If you still have difficulty reaching the provider, please page the [facility] (Director on Call) for the Hospitalists listed on amion for assistance.

## 2022-09-07 ENCOUNTER — Inpatient Hospital Stay (HOSPITAL_COMMUNITY): Payer: Medicare PPO

## 2022-09-07 DIAGNOSIS — I25708 Atherosclerosis of coronary artery bypass graft(s), unspecified, with other forms of angina pectoris: Secondary | ICD-10-CM | POA: Diagnosis not present

## 2022-09-07 DIAGNOSIS — A419 Sepsis, unspecified organism: Secondary | ICD-10-CM | POA: Diagnosis not present

## 2022-09-07 DIAGNOSIS — I219 Acute myocardial infarction, unspecified: Secondary | ICD-10-CM | POA: Diagnosis not present

## 2022-09-07 DIAGNOSIS — E118 Type 2 diabetes mellitus with unspecified complications: Secondary | ICD-10-CM | POA: Diagnosis not present

## 2022-09-07 DIAGNOSIS — N179 Acute kidney failure, unspecified: Secondary | ICD-10-CM

## 2022-09-07 DIAGNOSIS — L089 Local infection of the skin and subcutaneous tissue, unspecified: Secondary | ICD-10-CM | POA: Diagnosis not present

## 2022-09-07 LAB — BASIC METABOLIC PANEL
Anion gap: 9 (ref 5–15)
BUN: 17 mg/dL (ref 8–23)
CO2: 20 mmol/L — ABNORMAL LOW (ref 22–32)
Calcium: 7.8 mg/dL — ABNORMAL LOW (ref 8.9–10.3)
Chloride: 104 mmol/L (ref 98–111)
Creatinine, Ser: 1.27 mg/dL — ABNORMAL HIGH (ref 0.61–1.24)
GFR, Estimated: >60 mL/min (ref >60)
Glucose, Bld: 117 mg/dL — ABNORMAL HIGH (ref 70–99)
Potassium: 3.7 mmol/L (ref 3.5–5.1)
Sodium: 133 mmol/L — ABNORMAL LOW (ref 135–145)

## 2022-09-07 LAB — CBC WITH DIFFERENTIAL/PLATELET
Abs Immature Granulocytes: 0.03 10*3/uL (ref 0.00–0.07)
Abs Immature Granulocytes: 0.03 10*3/uL (ref 0.00–0.07)
Basophils Absolute: 0 10*3/uL (ref 0.0–0.1)
Basophils Absolute: 0 10*3/uL (ref 0.0–0.1)
Basophils Relative: 0 %
Basophils Relative: 0 %
Eosinophils Absolute: 0 10*3/uL (ref 0.0–0.5)
Eosinophils Absolute: 0.1 10*3/uL (ref 0.0–0.5)
Eosinophils Relative: 0 %
Eosinophils Relative: 1 %
HCT: 40.9 % (ref 39.0–52.0)
HCT: 44.1 % (ref 39.0–52.0)
Hemoglobin: 13.5 g/dL (ref 13.0–17.0)
Hemoglobin: 14.3 g/dL (ref 13.0–17.0)
Immature Granulocytes: 1 %
Immature Granulocytes: 1 %
Lymphocytes Relative: 3 %
Lymphocytes Relative: 3 %
Lymphs Abs: 0.2 10*3/uL — ABNORMAL LOW (ref 0.7–4.0)
Lymphs Abs: 0.2 10*3/uL — ABNORMAL LOW (ref 0.7–4.0)
MCH: 28.5 pg (ref 26.0–34.0)
MCH: 28.7 pg (ref 26.0–34.0)
MCHC: 32.4 g/dL (ref 30.0–36.0)
MCHC: 33 g/dL (ref 30.0–36.0)
MCV: 86.5 fL (ref 80.0–100.0)
MCV: 88.6 fL (ref 80.0–100.0)
Monocytes Absolute: 0.3 10*3/uL (ref 0.1–1.0)
Monocytes Absolute: 0.4 10*3/uL (ref 0.1–1.0)
Monocytes Relative: 5 %
Monocytes Relative: 5 %
Neutro Abs: 5.1 10*3/uL (ref 1.7–7.7)
Neutro Abs: 5.8 10*3/uL (ref 1.7–7.7)
Neutrophils Relative %: 90 %
Neutrophils Relative %: 91 %
Platelets: 102 10*3/uL — ABNORMAL LOW (ref 150–400)
Platelets: 104 10*3/uL — ABNORMAL LOW (ref 150–400)
RBC: 4.73 MIL/uL (ref 4.22–5.81)
RBC: 4.98 MIL/uL (ref 4.22–5.81)
RDW: 14.6 % (ref 11.5–15.5)
RDW: 14.6 % (ref 11.5–15.5)
WBC: 5.6 10*3/uL (ref 4.0–10.5)
WBC: 6.5 10*3/uL (ref 4.0–10.5)
nRBC: 0 % (ref 0.0–0.2)
nRBC: 0 % (ref 0.0–0.2)

## 2022-09-07 LAB — RESPIRATORY PANEL BY PCR

## 2022-09-07 LAB — COMPREHENSIVE METABOLIC PANEL
ALT: 33 U/L (ref 0–44)
AST: 38 U/L (ref 15–41)
Albumin: 2.7 g/dL — ABNORMAL LOW (ref 3.5–5.0)
Alkaline Phosphatase: 90 U/L (ref 38–126)
Anion gap: 8 (ref 5–15)
BUN: 17 mg/dL (ref 8–23)
CO2: 21 mmol/L — ABNORMAL LOW (ref 22–32)
Calcium: 8 mg/dL — ABNORMAL LOW (ref 8.9–10.3)
Chloride: 105 mmol/L (ref 98–111)
Creatinine, Ser: 1.08 mg/dL (ref 0.61–1.24)
GFR, Estimated: >60 mL/min (ref >60)
Glucose, Bld: 136 mg/dL — ABNORMAL HIGH (ref 70–99)
Potassium: 3.5 mmol/L (ref 3.5–5.1)
Sodium: 134 mmol/L — ABNORMAL LOW (ref 135–145)
Total Bilirubin: 1.7 mg/dL — ABNORMAL HIGH (ref 0.3–1.2)
Total Protein: 5.8 g/dL — ABNORMAL LOW (ref 6.5–8.1)

## 2022-09-07 LAB — GLUCOSE, CAPILLARY
Glucose-Capillary: 142 mg/dL — ABNORMAL HIGH (ref 70–99)
Glucose-Capillary: 131 mg/dL — ABNORMAL HIGH (ref 70–99)
Glucose-Capillary: 116 mg/dL — ABNORMAL HIGH (ref 70–99)
Glucose-Capillary: 124 mg/dL — ABNORMAL HIGH (ref 70–99)
Glucose-Capillary: 184 mg/dL — ABNORMAL HIGH (ref 70–99)

## 2022-09-07 LAB — LACTIC ACID, PLASMA: Lactic Acid, Venous: 1.5 mmol/L (ref 0.5–1.9)

## 2022-09-07 LAB — TROPONIN I (HIGH SENSITIVITY)
Troponin I (High Sensitivity): 1200 ng/L (ref <18)
Troponin I (High Sensitivity): 1241 ng/L (ref <18)

## 2022-09-07 MED ORDER — LACTATED RINGERS IV SOLN: INTRAVENOUS | Status: DC

## 2022-09-07 MED ORDER — PIPERACILLIN-TAZOBACTAM 3.375 G IVPB
3.3750 g | Freq: Three times a day (TID) | INTRAVENOUS | Status: DC
  Administered 2022-09-07 – 2022-09-11 (×11): 3.375 g via INTRAVENOUS
  Filled 2022-09-07 (×13): qty 50

## 2022-09-07 MED ORDER — PIPERACILLIN-TAZOBACTAM 3.375 G IVPB 30 MIN: 3.3750 g | Freq: Three times a day (TID) | INTRAVENOUS | Status: DC

## 2022-09-07 MED ORDER — GADOBUTROL 1 MMOL/ML IV SOLN
10.0000 mL | Freq: Once | INTRAVENOUS | Status: AC | PRN
  Administered 2022-09-07: 10 mL via INTRAVENOUS

## 2022-09-07 NOTE — Progress Notes (Addendum)
PROGRESS NOTE    KHALEEM SOGGE  UJW:[id number] DOB: September 13, 1955 DOA: 09/05/2022 PCP: Emilio Aspen, MD   Brief Narrative:  [name] is a 67 y.o. male with medical history significant of  CAD s/p PCI, DM-2, HTN, HLD, peripheral neuropathy-who [facility] for evaluation of fever or chills.  While in the ED, patient had transient hypotension which resolved with IV fluid bolus.  Patient was eventually admitted to hospitalist service with a diagnosis of sepsis presumed to be secondary to diabetic foot ulcer vs osteomyelitis.  Patient received antibiotics in the ED.  Assessment & Plan:   Principal Problem:   Sepsis due to skin infection (HCC) Active Problems:   CAD (coronary artery disease), native coronary artery   Essential hypertension   Hyperlipidemia   Polyneuropathy due to type 2 diabetes mellitus (HCC)   Type 2 diabetes mellitus with left diabetic foot infection (HCC)   Foot ulcer due to secondary DM (HCC)  Sepsis, presumed to be secondary to left great toe and second toe diabetic foot infection vs osteomyelitis,/left foot cellulitis POA: Patient met sepsis criteria based on fever 102.3 and tachypnea.  Last temperature spike at 3 AM 09/06/2022.  Overnight, he started having chills but no fever recorded.  MRI left foot ordered 48 hours ago and is still pending.  I have been informed by nurses that MRI department is short staffed and they are prioritizing emergencies.  I believe with his shivering overnight, it is important that we know if he is having osteomyelitis so we will change the study to stat as well.  Continue current antibiotics.  He has been seen by podiatry as well.  His clinical presentation of shivering suggest possible early bacteremia signs however fortunately his blood cultures negative so far.  Continue to follow.  Transient hypoxia: Was noted to be hypoxic in the ED.  Currently on 2 L but saturation 96%.  He is placed on oxygen for  comfort only.  He is comfortable and stable.  Hypokalemia: Resolved  History of CAD s/p remote PCI: Asymptomatic.  Continue aspirin and statin.  Type 2 diabetes mellitus: Last hemoglobin A1c October 2023 was 7.4.  Hold oral hypoglycemics.  Continue SSI.  Blood sugar controlled.  Hemoglobin A1c pending.  History of essential hypertension: Blood pressure better and improving but still on the low normal side so we will continue to hold antihypertensives except Toprol-XL.   Hyperlipidemia: Statin.  Peripheral neuropathy: Continue Lyrica.  Addendum 2:26 PM: Received a message from the nurse that patient has developed fever, he still has shivering and he is feeling weak and now is hypoxic.  I saw patient at the bedside right away.  He was getting labs done.  His only complaint once again was " not feeling well", he appeared diaphoretic.  He denied any cough, shortness of breath, chest pain, palpitation, abdominal pain, nausea, vomiting, any problem with urination or any other complaint.  Denied any recent sick contact as well.  Definitely his foot does not appear to be as bad to cause this clinical picture.  I suspect there is something else brewing.  We will expand our infectious workup and order chest x-ray, CBC, BMP, lactic acid, respiratory viral panel.  He had chest pain last night but none at the moment.  EKG was negative but we will go ahead and check cardiac enzymes as well.  Discussed with RN at the bedside.  We will closely monitor him.  We will give him Tylenol for  fever now.  Incentive spirometry.  I spent another 18 minutes on evaluation and documentation.  DVT prophylaxis: enoxaparin (LOVENOX) injection 40 mg Start: 09/05/22 1745   Code Status: Full Code  Family Communication: None present at bedside.  Plan of care discussed with patient in length and he/she verbalized understanding and agreed with it.  Status is: Inpatient Remains inpatient appropriate because: Pending MRI and sick  today.   Estimated body mass index is 37.71 kg/m as calculated from the following:   Height as of this encounter: 5\' 8"  (1.727 m).   Weight as of this encounter: 112.5 kg.    Nutritional Assessment: Body mass index is 37.71 kg/m.Marland Kitchen Seen by dietician.  I agree with the assessment and plan as outlined below: Nutrition Status:        . Skin Assessment: I have examined the patient's skin and I agree with the wound assessment as performed by the wound care RN as outlined below:    Consultants:  None  Procedures:  None  Antimicrobials:  Anti-infectives (From admission, onward)    Start     Dose/Rate Route Frequency Ordered Stop   09/06/22 1500  vancomycin (VANCOREADY) IVPB 1750 mg/350 mL  Status:  Discontinued        1,750 mg 175 mL/hr over 120 Minutes Intravenous Every 24 hours 09/05/22 1250 09/05/22 1717   09/06/22 1200  sulfamethoxazole-trimethoprim (BACTRIM DS) 800-160 MG per tablet 2 tablet        2 tablet Oral Every 12 hours 09/06/22 0958 09/13/22 0959   09/05/22 1400  vancomycin (VANCOCIN) IVPB 1000 mg/200 mL premix       See Hyperspace for full Linked Orders Report.   1,000 mg 200 mL/hr over 60 Minutes Intravenous  Once 09/05/22 1247 09/05/22 1601   09/05/22 1300  ceFEPIme (MAXIPIME) 2 g in sodium chloride 0.9 % 100 mL IVPB  Status:  Discontinued        2 g 200 mL/hr over 30 Minutes Intravenous Every 8 hours 09/05/22 1247 09/05/22 1649   09/05/22 1300  metroNIDAZOLE (FLAGYL) IVPB 500 mg        500 mg 100 mL/hr over 60 Minutes Intravenous  Once 09/05/22 1247 09/05/22 1456   09/05/22 1300  vancomycin (VANCOCIN) IVPB 1000 mg/200 mL premix       See Hyperspace for full Linked Orders Report.   1,000 mg 200 mL/hr over 60 Minutes Intravenous  Once 09/05/22 1247 09/05/22 1443         Subjective: Patient seen and examined.  Complains of shivering.  Per nurse, he was complaining of chest pain last night and EKG was done which was negative for acute ST-T wave changes.   He received 3 doses of nitroglycerin.  Currently he has no chest pain, abdominal pain or shortness of breath.  Only complaint is shivering.  His temperature was 100 overnight as well.  Objective: Vitals:   09/07/22 0318 09/07/22 0330 09/07/22 0400 09/07/22 0600  BP: 114/63     Pulse: 92  87 80  Resp: 18  20 19   Temp:  100 F (37.8 C)    TempSrc:  Axillary    SpO2: 97%  94% 95%  Weight:      Height:        Intake/Output Summary (Last 24 hours) at 09/07/2022 0749 Last data filed at 09/07/2022 0700 Gross per 24 hour  Intake 2570.68 ml  Output 2450 ml  Net 120.68 ml    Filed Weights   09/05/22 1055 09/05/22 1607  Weight: 112.5 kg 112.5 kg    Examination:  General exam: Appears sick looking Respiratory system: Clear to auscultation. Respiratory effort normal. Cardiovascular system: S1 & S2 heard, RRR. No JVD, murmurs, rubs, gallops or clicks. No pedal edema. Gastrointestinal system: Abdomen is nondistended, soft and nontender. No organomegaly or masses felt. Normal bowel sounds heard. Central nervous system: Alert and oriented. No focal neurological deficits. Extremities: ulcer at the left second toe and great toe with very minimal erythema, much improved compared to yesterday, Psychiatry: Judgement and insight appear normal. Mood & affect appropriate.   Data Reviewed: I have personally reviewed following labs and imaging studies  CBC: Recent Labs  Lab 09/05/22 1122 09/05/22 1946 09/06/22 0013 09/07/22 0019  WBC 7.5 10.4 9.7 6.5  NEUTROABS  --   --   --  5.8  HGB 16.9 15.1 14.3 14.3  HCT 50.5 46.1 44.3 44.1  MCV 86.3 86.3 88.8 88.6  PLT 105* 108* 107* 104*    Basic Metabolic Panel: Recent Labs  Lab 09/05/22 1122 09/05/22 1818 09/06/22 0013 09/07/22 0019  NA 138  --  137 134*  K 3.7  --  3.2* 3.5  CL 102  --  100 105  CO2 24  --  22 21*  GLUCOSE 157*  --  113* 136*  BUN 17  --  14 17  CREATININE 1.02 1.30* 1.22 1.08  CALCIUM 9.2  --  8.3* 8.0*     GFR: Estimated Creatinine Clearance: 81.8 mL/min (by C-G formula based on SCr of 1.08 mg/dL). Liver Function Tests: Recent Labs  Lab 09/06/22 0013 09/07/22 0019  [name] 26 38  ALT 21 33  ALKPHOS 58 90  BILITOT 1.0 1.7*  PROT 5.9* 5.8*  ALBUMIN 2.9* 2.7*    No results for input(s): "LIPASE", "AMYLASE" in the last 168 hours. No results for input(s): "AMMONIA" in the last 168 hours. Coagulation Profile: Recent Labs  Lab 09/05/22 1237  INR 1.1    Cardiac Enzymes: No results for input(s): "CKTOTAL", "CKMB", "CKMBINDEX", "TROPONINI" in the last 168 hours. BNP (last 3 results) No results for input(s): "PROBNP" in the last 8760 hours. HbA1C: No results for input(s): "HGBA1C" in the last 72 hours. CBG: Recent Labs  Lab 09/06/22 0620 09/06/22 1108 09/06/22 1626 09/06/22 2101 09/07/22 0559  GLUCAP 117* 129* 119* 145* 142*    Lipid Profile: No results for input(s): "CHOL", "HDL", "LDLCALC", "TRIG", "CHOLHDL", "LDLDIRECT" in the last 72 hours. Thyroid Function Tests: No results for input(s): "TSH", "T4TOTAL", "FREET4", "T3FREE", "THYROIDAB" in the last 72 hours. Anemia Panel: No results for input(s): "VITAMINB12", "FOLATE", "FERRITIN", "TIBC", "IRON", "RETICCTPCT" in the last 72 hours. Sepsis Labs: Recent Labs  Lab 09/05/22 1237 09/05/22 1455  LATICACIDVEN 2.1* 2.1*     Recent Results (from the past 240 hour(s))  Blood Culture (routine x 2)     Status: None (Preliminary result)   Collection Time: 09/05/22 12:52 PM   Specimen: BLOOD LEFT FOREARM  Result Value Ref Range Status   Specimen Description   Final    BLOOD LEFT FOREARM Performed at [facility], [address], [place], [place] [address]    Special Requests   Final    BOTTLES DRAWN AEROBIC AND ANAEROBIC Blood Culture adequate volume Performed at [facility], [address], [place], [place] [address]    Culture PENDING  Incomplete   Report Status PENDING  Incomplete   Blood Culture (routine x 2)     Status: None (Preliminary result)  Collection Time: 09/05/22 12:57 PM   Specimen: BLOOD  Result Value Ref Range Status   Specimen Description   Final    BLOOD BLOOD RIGHT WRIST Performed at [facility], [address] [place], [place] [address]    Special Requests   Final    BOTTLES DRAWN AEROBIC AND ANAEROBIC Blood Culture adequate volume Performed at [facility], [address], [place], [place] [address]    Culture   Final    NO GROWTH < 24 HOURS Performed at [facility], [address] [place], [place] [address]    Report Status PENDING  Incomplete  SARS Coronavirus 2 by RT PCR (hospital order, performed in [facility] hospital lab) *cepheid single result test* Anterior Nasal Swab     Status: None   Collection Time: 09/05/22  2:19 PM   Specimen: Anterior Nasal Swab  Result Value Ref Range Status   SARS Coronavirus 2 by RT PCR NEGATIVE NEGATIVE Final    Comment: (NOTE) SARS-CoV-2 target nucleic acids are NOT DETECTED.  The SARS-CoV-2 RNA is generally detectable in upper and lower respiratory specimens during the acute phase of infection. The lowest concentration of SARS-CoV-2 viral copies this assay can detect is 250 copies / mL. A negative result does not preclude SARS-CoV-2 infection and should not be used as the sole basis for treatment or other patient management decisions.  A negative result may occur with improper specimen collection / handling, submission of specimen other than nasopharyngeal swab, presence of viral mutation(s) within the areas targeted by this assay, and inadequate number of viral copies (<250 copies / mL). A negative result must be combined with clinical observations, patient history, and epidemiological information.  Fact Sheet for Patients:   RoadLapTop.co.za  Fact Sheet for Healthcare  Providers: http://kim-miller.com/  This test is not yet approved or  cleared by the [place] FDA and has been authorized for detection and/or diagnosis of SARS-CoV-2 by FDA under an Emergency Use Authorization (EUA).  This EUA will remain in effect (meaning this test can be used) for the duration of the COVID-19 declaration under Section 564(b)(1) of the Act, 21 U.S.C. section 360bbb-3(b)(1), unless the authorization is terminated or revoked sooner.  Performed at [facility], [address] [place], [place] [address]      Radiology Studies: DG Toe 2nd Left  Result Date: 09/05/2022 CLINICAL DATA:  Right foot wound. EXAM: LEFT SECOND TOE COMPARISON:  January 25, 2022. FINDINGS: There is no evidence of fracture or dislocation. There is no evidence of arthropathy or other focal bone abnormality. Soft tissues are unremarkable. IMPRESSION: Negative. Electronically Signed   By: Lupita Raider M.D.   On: 09/05/2022 12:26   DG Foot 2 Views Right  Result Date: 09/05/2022 CLINICAL DATA:  Midfoot ulcer. EXAM: RIGHT FOOT - 2 VIEW COMPARISON:  09/17/2021 FINDINGS: Two views study shows no definite fracture or dislocation. Advanced arthropathy again noted in the midfoot, similar to prior. No gross bony destruction to suggest osteomyelitis. IMPRESSION: Advanced arthropathy in the midfoot, similar to prior. No gross bony destruction to suggest osteomyelitis. MRI of the foot with and without contrast could be used for more sensitive assessment as clinically warranted. Electronically Signed   By: Kennith Center M.D.   On: 09/05/2022 12:23   DG Chest Portable 1 View  Result Date: 09/05/2022 CLINICAL DATA:  Weakness EXAM: PORTABLE CHEST 1 VIEW COMPARISON:  CXR 01/25/22 FINDINGS: No pleural effusion. No pneumothorax. Unchanged cardiac and mediastinal contours mild  cardiomegaly. Hazy opacity at the left lung base is favored represent atelectasis, but infection is not  excluded. No radiographically apparent displaced rib fractures. Visualized upper abdomen is unremarkable. IMPRESSION: Cardiomegaly and left basilar atelectasis. Electronically Signed   By: Lorenza Cambridge M.D.   On: 09/05/2022 12:23    Scheduled Meds:  aspirin EC  325 mg Oral QHS   atorvastatin  80 mg Oral QHS   enoxaparin (LOVENOX) injection  40 mg Subcutaneous Q24H   insulin aspart  0-9 Units Subcutaneous TID WC   isosorbide mononitrate  120 mg Oral Daily   metoprolol succinate  50 mg Oral Daily   pantoprazole  40 mg Oral Daily   pregabalin  150 mg Oral BID   sulfamethoxazole-trimethoprim  2 tablet Oral Q12H   tamsulosin  0.4 mg Oral QHS   Continuous Infusions:  sodium chloride Stopped (09/07/22 0559)     LOS: 2 days   Hughie Closs, MD [facility]  09/07/2022, 7:49 AM   *Please note that this is a verbal dictation therefore any spelling or grammatical errors are due to the "Dragon Medical One" system interpretation.  Please page via Amion and do not message via secure chat for urgent patient care matters. Secure chat can be used for non urgent patient care matters.  How to contact the [facility] Attending or Consulting provider 7A - 7P or covering provider during after hours 7P -7A, for this patient?  Check the care team in [facility] and look for a) attending/consulting [facility] provider listed and b) the [facility] team listed. Page or secure chat 7A-7P. Log into [email or web address] and use [place]'s universal password to access. If you do not have the password, please contact the hospital operator. Locate the [facility] provider you are looking for under [facility] and page to a number that you can be directly reached. If you still have difficulty reaching the provider, please page the [facility] (Director on Call) for the Hospitalists listed on amion for assistance.

## 2022-09-07 NOTE — Consult Note (Signed)
Cardiology Consultation   Patient ID: [name] MRN: [id number]; DOB: 04-15-1955  Admit date: 09/05/2022 Date of Consult: 09/07/2022  PCP:  Levi Aspen, MD   [facility] Providers Cardiologist:  Levi Fair, MD        Patient Profile:   [name] is a 67 y.o. male with a hx of CAD who is being seen 09/07/2022 for the evaluation of elevated troponin and chest pain at the request of Dr. Hughie [name].  History of Present Illness:   [name] has a longstanding history of coronary artery disease initially diagnosed in 1997 when he received right coronary artery stent.  Other medical problems include hyperlipidemia, hypertension, type 2 diabetes mellitus complicated by peripheral neuropathy and right Charcot foot, morbid obesity, OSA, recurrent problems with cellulitis, left knee arthroplasty.  He was reevaluated with cardiac catheterization in 2008 which showed that the right coronary artery stent was totally occluded.  Attempts at revascularization with angioplasty were unsuccessful.  There were good left-to-right collaterals.  In addition he had high-grade 80-90% stenosis of the distal left circumflex coronary artery beyond the first obtuse marginal branch, too small for PCI.  Subsequent nuclear perfusion studies performed in 2019 and 2022 have shown low risk findings: inferior wall scar without any significant areas of ischemia and normal left ventricular systolic function (EF 56% on the last study).  He had abrupt onset of uncontrollable shaking chills on 09/05/2022 followed by fever up to 103 F.  He did have transient hypotension with systolic blood pressure as low as 89 mmHg, improved with intravenous fluids.  He has been receiving antibiotics.  On the afternoon of 09/06/2022 he had angina that resolved promptly after he was administered 1 sublingual nitroglycerin.  He has not had any chest discomfort since.  He reports that if he "does too much" (for  example sweeping the driveway" he will develop chest discomfort that resolved spontaneously with rest.  This has been the pattern for many years.  He does not have much in the way of exertional dyspnea, also denies orthopnea, PND, lower extremity edema.  He has never required diuretic therapy for heart failure.  He denies syncope or palpitations.  He did have a debridement of a small ulceration on the tip of the left second toe.  He also has discoloration of the skin on the ventral surface of the right foot instep where he has a "Charcot joint".  He mentions that he has had similar problems with shaking chills and fever in June and October of last year.  He tells me that the exact source of infection was never clearly identified.  He also had shaking chills and fever roughly 2 weeks after he underwent otherwise uncomplicated left total knee replacement in 2019.  He does not smoke.  He is compliant with CPAP.  Troponin checked at 1423 hrs. today, roughly 18 hours after his episodes of chest pain in the hospital was elevated at 1200.  A second assay performed roughly 2 hours later shows unchanged troponin I at 1241.  Echocardiogram does not show ischemic changes.  Interestingly his white count is normal.  He has mild thrombocytopenia.  Renal function parameters are slightly worse than baseline with a creatinine of 1.27 (usually around 1.1.  Lactic acid level was elevated at 2.1, but has normalized.  DM control is [name] with a hemoglobin A1c of 7.4% last October.  A.m. cortisol drawn about a year ago was normal at 19.4. Chest x-ray shows a  vague hazy opacity in the left lung base that is most likely atelectasis.  Previous CT of the chest shows aortic atherosclerosis.  MRI of the left foot performed last October shows evidence of heterogenous marrow signal in the navicular, cuboid and calcaneus, likely secondary to Charcot arthropathy, but did not show osteomyelitis.  MRI of the right foot showed superficial  ulceration without osteomyelitis, again with changes of Charcot arthropathy.  He was the primary caregiver for his wife of 42 years (she suffered from multiple sclerosis and passed away about a year ago).  His son is his major support.  He still lives independently and drives.  Dr. Katrinka [name] was his cardiologist for 30 years.  We will make arrangements for follow-up with a new cardiologist since Dr. Katrinka [name] has retired.  I would be delighted to see him in the clinic if he agrees.  Past Medical History:  Diagnosis Date   Arthritis    "ankles, knees" (01/29/2018)   CAD in native artery    cath report from 02/2017 indicates patient had a history of RCA stent >10 years prior to that. At time of that cath he was found to have EF 70%, 20-30% ostial LAD, 30-40% prox LAD, 30-40% Cx, 80-90% Cx beyond OM1, totally occluded RCA with left-to-right collaterals.   Cellulitis 03/03/2022   Diabetes mellitus type 2 in obese    GERD (gastroesophageal reflux disease)    Hyperlipidemia    Hypertension    Morbid obesity (HCC)    Onychomycosis 03/03/2022   OSA on CPAP     Past Surgical History:  Procedure Laterality Date   CARDIAC CATHETERIZATION  2008   CORONARY ANGIOPLASTY WITH STENT PLACEMENT  1997   JOINT REPLACEMENT     KNEE ARTHROSCOPY Bilateral    "meniscus repairs"   TONSILLECTOMY     TOTAL KNEE ARTHROPLASTY Left 01/29/2018   TOTAL KNEE ARTHROPLASTY Left 01/29/2018   Procedure: LEFT TOTAL KNEE ARTHROPLASTY;  Surgeon: Tarry Kos, MD;  Location: [facility] OR;  Service: Orthopedics;  Laterality: Left;   UVULOPALATOPHARYNGOPLASTY  ~ 1994     Home Medications:  Prior to Admission medications   Medication Sig Start Date End Date Taking? Authorizing Provider  acetaminophen (TYLENOL) 650 MG CR tablet Take 1,300 mg by mouth at bedtime.   Yes [provider]  aspirin EC 325 MG tablet Take 325 mg by mouth at bedtime.   Yes [provider]  atorvastatin (LIPITOR) 80 MG tablet Take 80 mg by mouth  at bedtime.  02/09/13  Yes [provider]  celecoxib (CELEBREX) 200 MG capsule TAKE 1 CAPSULE BY MOUTH EVERY DAY Patient taking differently: Take 200 mg by mouth daily. 07/23/22  Yes Vivi Barrack, DPM  Cholecalciferol (VITAMIN D3) 2000 units TABS Take 2,000 Units by mouth daily.   Yes [provider]  empagliflozin (JARDIANCE) 25 MG TABS tablet Take 1 tablet (25 mg total) by mouth daily. 01/09/22  Yes Lyn Records, MD  isosorbide mononitrate (IMDUR) 120 MG 24 hr tablet TAKE 1 TABLET BY MOUTH EVERY DAY Patient taking differently: Take 120 mg by mouth daily. 01/08/22  Yes Lyn Records, MD  metoprolol succinate (TOPROL-XL) 100 MG 24 hr tablet TAKE 1 TABLET BY MOUTH DAILY. PLEASE CALL TO SCHEDULE AN OVERDUE APPOINTMENT WITH DR. Katrinka [name] FOR REFILLS, [phone], THANK YOU. 3RD ATTEMPT. NO MORE REFILLS GIVEN Patient taking differently: Take 100 mg by mouth daily. 01/07/22  Yes Lyn Records, MD  Multiple Vitamin (MULTIVITAMIN) tablet Take 1 tablet by mouth  daily.   Yes [provider]  nitroGLYCERIN (NITROSTAT) 0.4 MG SL tablet Place 1 tablet (0.4 mg total) under the tongue every 5 (five) minutes x 3 doses as needed for chest pain. 12/07/20  Yes Lyn Records, MD  omeprazole (PRILOSEC) 20 MG capsule Take 20 mg by mouth daily.  02/03/13  Yes [provider]  pregabalin (LYRICA) 150 MG capsule Take 1 capsule (150 mg total) by mouth 2 (two) times daily. 07/14/22  Yes Vivi Barrack, DPM  senna-docusate (SENOKOT S) 8.6-50 MG tablet Take 1 tablet by mouth at bedtime as needed. Patient taking differently: Take 2 tablets by mouth at bedtime. 01/29/18  Yes Tarry Kos, MD  sulfamethoxazole-trimethoprim (BACTRIM DS) 800-160 MG tablet Take 1 tablet by mouth 2 (two) times daily. 09/04/22  Yes Vivi Barrack, DPM  Tetrahydrozoline HCl (VISINE OP) Place 1 drop into both eyes daily as needed (for dry or irritated eyes).    Yes [provider]   triamterene-hydrochlorothiazide (MAXZIDE) 75-50 MG per tablet Take 0.5 tablets by mouth daily.  01/06/13  Yes [provider]  WELCHOL 625 MG tablet Take 1,875 mg by mouth at bedtime.  05/19/13  Yes [provider]  Menthol, Topical Analgesic, 4 % GEL Apply 1 application topically See admin instructions. Apply to both feet at bedtime for neuropathy Patient not taking: Reported on 09/07/2022    [provider]  MOUNJARO 2.5 MG/0.5ML Pen SMARTSIG:2.5 Milligram(s) SUB-Q Once a Week Patient not taking: Reported on 06/16/2022 02/26/22   [provider]  tamsulosin (FLOMAX) 0.4 MG CAPS capsule Take 0.4 mg by mouth at bedtime. Patient not taking: Reported on 09/07/2022 04/05/22   [provider]    Inpatient Medications: Scheduled Meds:  aspirin EC  325 mg Oral QHS   atorvastatin  80 mg Oral QHS   enoxaparin (LOVENOX) injection  40 mg Subcutaneous Q24H   insulin aspart  0-9 Units Subcutaneous TID WC   isosorbide mononitrate  120 mg Oral Daily   metoprolol succinate  50 mg Oral Daily   pantoprazole  40 mg Oral Daily   pregabalin  150 mg Oral BID   tamsulosin  0.4 mg Oral QHS   Continuous Infusions:  sodium chloride Stopped (09/07/22 0559)   lactated ringers 125 mL/hr at 09/07/22 1725   piperacillin-tazobactam (ZOSYN)  IV 3.375 g (09/07/22 1557)   PRN Meds: acetaminophen **OR** acetaminophen, albuterol, nitroGLYCERIN, ondansetron **OR** ondansetron (ZOFRAN) IV, polyethylene glycol  Allergies:    Allergies  Allergen Reactions   Metformin Other (See Comments)   Penicillins Rash    Has patient had a PCN reaction causing immediate rash, facial/tongue/throat swelling, SOB or lightheadedness with hypotension: Yes Has patient had a PCN reaction causing severe rash involving mucus membranes or skin necrosis: Unk Has patient had a PCN reaction that required hospitalization: Unk Has patient had a PCN reaction occurring within the last 10 years: No If all of  the above answers are "NO", then may proceed with Cephalosporin use.     Social History:   Social History   Socioeconomic History   Marital status: Widowed    Spouse name: Not on file   Number of children: Not on file   Years of education: Not on file   Highest education level: Not on file  Occupational History   Not on file  Tobacco Use   Smoking status: Never    Passive exposure: Never   Smokeless tobacco: Never  Vaping Use   Vaping Use: Never  used  Substance and Sexual Activity   Alcohol use: Yes    Comment: 01/29/2018 "may have 1 beer/month; if that"   Drug use: Never   Sexual activity: Not Currently  Other Topics Concern   Not on file  Social History Narrative   Not on file   Social Determinants of Health   Financial Resource Strain: Not on file  Food Insecurity: No Food Insecurity (09/07/2022)   Hunger Vital Sign    Worried About Running Out of Food in the Last Year: Never true    Ran Out of Food in the Last Year: Never true  Transportation Needs: No Transportation Needs (09/07/2022)   PRAPARE - Administrator, Civil Service (Medical): No    Lack of Transportation (Non-Medical): No  Physical Activity: Not on file  Stress: Not on file  Social Connections: Not on file  Intimate Partner Violence: Not At Risk (09/07/2022)   Humiliation, Afraid, Rape, and Kick questionnaire    Fear of Current or Ex-Partner: No    Emotionally Abused: No    Physically Abused: No    Sexually Abused: No    Family History:    Family History  Problem Relation Age of Onset   Heart attack Mother    Heart attack Father      ROS:  Please see the history of present illness.   All other ROS reviewed and negative.     Physical Exam/Data:   Vitals:   09/07/22 1152 09/07/22 1354 09/07/22 1513 09/07/22 1610  BP: (!) 112/45 (!) 106/52 (!) 91/52 (!) 102/57  Pulse: 82 90 78 72  Resp: (!) 22 (!) 22 (!) 23 19  Temp: 98.4 F (36.9 C) (!) 101.7 F (38.7 C) 99.9 F (37.7 C)  98.8 F (37.1 C)  TempSrc: Oral Oral Oral Oral  SpO2: 93% 91% 95% 95%  Weight:      Height:        Intake/Output Summary (Last 24 hours) at 09/07/2022 1741 Last data filed at 09/07/2022 1726 Gross per 24 hour  Intake 2690.79 ml  Output 1600 ml  Net 1090.79 ml      09/05/2022    4:07 PM 09/05/2022   10:55 AM 03/03/2022    2:00 PM  Last 3 Weights  Weight (lbs) 248 lb 0.3 oz 248 lb 257 lb  Weight (kg) 112.5 kg 112.492 kg 116.574 kg     Body mass index is 37.71 kg/m.  General:  Well nourished, well developed, in no acute distress, morbid obesity HEENT: normal Neck: no JVD Vascular: No carotid bruits; Distal pulses 2+ bilaterally Cardiac:  normal S1, S2; RRR; no murmur  Lungs:  clear to auscultation bilaterally, no wheezing, rhonchi or rales  Abd: soft, nontender, no hepatomegaly  Ext: no edema Musculoskeletal:  No deformities, BUE and BLE strength normal and equal.  What appears to be superficial ulcerations are seen on the second left great toe and in the instep of the plantar surface of the right foot.  The right foot feels much warmer than the left and has a reddish discoloration on the plantar surface. Skin: warm and dry  Neuro:  CNs 2-12 intact, no focal abnormalities noted Psych:  Normal affect   EKG:  The EKG was personally reviewed and demonstrates: Normal sinus rhythm, incomplete right bundle branch block, borderline criteria for left anterior fascicular block, subtle T wave inversion leads V1-V3.  The repolarization abnormalities are not dynamic and are very similar to his EKG from last October. Telemetry:  Telemetry was personally reviewed and demonstrates: Sinus rhythm  Relevant CV Studies: Cardiac catheterization 10-04-2006  RESULTS:  1. Hemodynamic data:      a.     Aortic pressure 112/70.      b.     Left ventricular pressure 115/9.  2. Left ventriculorrhaphy:  The Left ventricular cavity size and      function are normal.  EF was 70%.  No mitral regurgitation is       noted.  3. Coronary angiography.      a.     Left main coronary:  The distal left main contains less than       25% stenosis.      b.     Left anterior descending coronary:  The LAD is a large       vessel that reaches the left ventricular apex and gives origin to       a large diagonal.  There is eccentric 20% to 30% osteo LAD       narrowing, segmental 30% to 40% narrowing in the proximal LAD.  No       high-grade obstruction is present.      c.     Circumflex artery:  Circumflex artery gives origin to a       large branching first obtuse marginal that arises after the first       2-3 mm of the circumflex.  There is eccentric 30% to 40% narrowing       before the first obtuse marginal.  Circumflex beyond the first       obtuse marginal was small and the continuation of the circumflex       after the marginal contains an 80% to 90% obstruction, and the       obtuse marginal beyond this is a small branching vessel less than       1.5 mm in diameter.      d.     Right coronary:  The right coronary artery is totally       occluded in the mid vessel.  This was within the previously       stented segment.  The distal right coronary is filled by LAD and       circumflex, collaterals to the distal right coronary and the PDA.       There is a retrograde flow of contrast into the mid right coronary       noted on long cine run.  4. PCI:  We were unable to cross the total occlusion in the mid RCA      restenotic stent.  A 30-40 minutes was spent attempting to cross      into the distal RCA without success.    CONCLUSIONS:  1. Severe coronary artery disease with total occlusion of the right      coronary artery and left-to-right collaterals.  There is      significant stenosis and small continuation of the circumflex      beyond the first obtuse marginal.  The first obtuse marginal, LAD,      and left main are patent.  2. Normal left ventricular function.    PLAN:  Aggressively titrate  medications and attempt to control the  patient's symptoms with medical therapy.  If this fails, consideration  of repeat attempted PCI or coronary artery bypass grafting should be  entertained.   Nuclear stress test 2020-10-03  Nuclear stress EF: 62%. There was no ST segment  deviation noted during stress. There is a medium defect of moderate severity present in the basal inferior, mid inferior and apical inferior location. The defect is non-reversible and in the setting of normal LVF this is consistent with diaphragmatic attenuation artifact. No ischemia noted. The left ventricular ejection fraction is normal (55-65%). This is a low risk study.        Laboratory Data:  High Sensitivity Troponin:   Recent Labs  Lab 09/07/22 1423 09/07/22 1611  TROPONINIHS 1,200* 1,241*     Chemistry Recent Labs  Lab 09/06/22 0013 09/07/22 0019 09/07/22 1424  NA 137 134* 133*  K 3.2* 3.5 3.7  CL 100 105 104  CO2 22 21* 20*  GLUCOSE 113* 136* 117*  BUN 14 17 17   CREATININE 1.22 1.08 1.27*  CALCIUM 8.3* 8.0* 7.8*  GFRNONAA >60 >60 >60  ANIONGAP 15 8 9     Recent Labs  Lab 09/06/22 0013 09/07/22 0019  PROT 5.9* 5.8*  ALBUMIN 2.9* 2.7*  [name] 26 38  ALT 21 33  ALKPHOS 58 90  BILITOT 1.0 1.7*   Lipids No results for input(s): "CHOL", "TRIG", "HDL", "LABVLDL", "LDLCALC", "CHOLHDL" in the last 168 hours.  Hematology Recent Labs  Lab 09/06/22 0013 09/07/22 0019 09/07/22 1424  WBC 9.7 6.5 5.6  RBC 4.99 4.98 4.73  HGB 14.3 14.3 13.5  HCT 44.3 44.1 40.9  MCV 88.8 88.6 86.5  MCH 28.7 28.7 28.5  MCHC 32.3 32.4 33.0  RDW 14.6 14.6 14.6  PLT 107* 104* 102*   Thyroid No results for input(s): "TSH", "FREET4" in the last 168 hours.  BNPNo results for input(s): "BNP", "PROBNP" in the last 168 hours.  DDimer No results for input(s): "DDIMER" in the last 168 hours.   Radiology/Studies:  DG CHEST PORT 1 VIEW  Result Date: 09/07/2022 CLINICAL DATA:  Pneumonia. EXAM: PORTABLE CHEST 1 VIEW  COMPARISON:  09/05/2022 FINDINGS: Stable cardiomediastinal contours. Unchanged hazy opacity in the left base which may reflect atelectasis or pneumonia. No signs of pleural effusion or interstitial edema. The visualized osseous structures appear unremarkable. IMPRESSION: Persistent hazy opacity in the left base compatible with atelectasis or pneumonia. Electronically Signed   By: Signa Kell M.D.   On: 09/07/2022 15:07   DG Toe 2nd Left  Result Date: 09/05/2022 CLINICAL DATA:  Right foot wound. EXAM: LEFT SECOND TOE COMPARISON:  January 25, 2022. FINDINGS: There is no evidence of fracture or dislocation. There is no evidence of arthropathy or other focal bone abnormality. Soft tissues are unremarkable. IMPRESSION: Negative. Electronically Signed   By: Lupita Raider M.D.   On: 09/05/2022 12:26   DG Foot 2 Views Right  Result Date: 09/05/2022 CLINICAL DATA:  Midfoot ulcer. EXAM: RIGHT FOOT - 2 VIEW COMPARISON:  09/17/2021 FINDINGS: Two views study shows no definite fracture or dislocation. Advanced arthropathy again noted in the midfoot, similar to prior. No gross bony destruction to suggest osteomyelitis. IMPRESSION: Advanced arthropathy in the midfoot, similar to prior. No gross bony destruction to suggest osteomyelitis. MRI of the foot with and without contrast could be used for more sensitive assessment as clinically warranted. Electronically Signed   By: Kennith Center M.D.   On: 09/05/2022 12:23   DG Chest Portable 1 View  Result Date: 09/05/2022 CLINICAL DATA:  Weakness EXAM: PORTABLE CHEST 1 VIEW COMPARISON:  CXR 01/25/22 FINDINGS: No pleural effusion. No pneumothorax. Unchanged cardiac and mediastinal contours mild cardiomegaly. Hazy opacity at the left lung base is favored represent atelectasis, but infection is not  excluded. No radiographically apparent displaced rib fractures. Visualized upper abdomen is unremarkable. IMPRESSION: Cardiomegaly and left basilar atelectasis. Electronically  Signed   By: Lorenza Cambridge M.D.   On: 09/05/2022 12:23     Assessment and Plan:   Demand myocardial infarction: Although there were no large areas of ischemia on his previous nuclear stress test, it is quite likely that he has some patches of viable and ischemic myocardium in the territory of the occluded right coronary artery, where there are good left-to-right collaterals.  In addition he is likely to have ischemic myocardium in the distribution of the severely diseased distal left circumflex coronary artery.  This is likely why he has a chronic pattern of CCS functional class II stable angina pectoris.  In the setting of increased demand (sepsis) or poor delivery (hypotension), these areas could well suffer infarction.  Magnitude of troponin elevation is small and is unlikely to have an impact on overall left ventricular function, but will check an echocardiogram for any evidence of interval change in unexpected territories.  The ECG does not show high risk features. Sepsis: Fever up to 101.7 F this afternoon despite receiving antibiotics throughout the day.  The exact source is uncertain, although he has a more obvious ulceration, recently debrided on his left second toe, I find that the right foot is surprisingly warm and has slight erythema.  It is probably worthwhile evaluating for osteomyelitis in both locations.  MRI scans of both feet in June and October 2023, respectively, did not show evidence of osteomyelitis.  His urinalysis is pretty clean, low suspicion for UTI.  He does have a lung infiltrate on chest x-ray, but has not had cough or pleurisy. DM: Complicated by neuropathy and bilateral Charcot arthropathy.  [name] control with hemoglobin A1c 7.4%. AKI: Mild, probably related to his episodes of hypotension.   Will follow along.  If there is new wall motion abnormality on his echocardiogram outside of the expected territory of the inferior and inferolateral walls, may have to reconsider the  need for invasive angiography.  Similarly, may need to consider angiography if he has worsening angina.  Risk Assessment/Risk Scores:     TIMI Risk Score for Unstable Angina or Non-ST Elevation MI:   The patient's TIMI risk score is 5, which indicates a 26% risk of all cause mortality, new or recurrent myocardial infarction or need for urgent revascularization in the next 14 days.          For questions or updates, please contact [facility] Please consult [email or web address] for contact info under    Signed, Levi Fair, MD  09/07/2022 5:41 PM

## 2022-09-07 NOTE — Progress Notes (Signed)
  Subjective:  Patient ID: [name], male    DOB: December 28, 1955,  MRN: [id number]  Patient seen sleeping in bed, on awakening he says he feels terrible and is still having quite a few chills.  Also had some chest pain type symptoms, had to take something for this  Chest pain: yes Shortness of breath: no Constitutional signs: Still having chills, says he feels feverish despite normal temp  Objective:   Vitals:   09/07/22 1152 09/07/22 1354  BP: (!) 112/45   Pulse: 82   Resp: (!) 22   Temp: 98.4 F (36.9 C) 100.1 F (37.8 C)  SpO2: 93%    General AA&O x3. Normal mood and affect.  Vascular Dorsalis pedis and posterior tibial pulses 2/4 bilat. Brisk capillary refill to all digits. Pedal hair reduced.  Neurologic Epicritic sensation grossly absent.  Dermatologic Full-thickness ulceration distal tip of second toe, no active drainage, no cellulitis or erythema which has resolved completely  Orthopedic: MMT 5/5 in dorsiflexion, plantarflexion, inversion, and eversion. Normal joint ROM without pain or crepitus.       Assessment & Plan:  Patient was evaluated and treated and all questions answered.  Cellulitis left toe, ulcer -Still awaiting MRI.  Priority upgraded to stat this morning by Dr. Jacqulyn Bath -Physical exam looks even better today with the toe.  The appearance of the toe is surprising considering how he says he is feeling.  Has not had any respiratory symptoms, medicine doubtful that a viral illness is causing this.  They will be checking his troponins. -Dressing changed, will reevaluate again tomorrow -Will update patient and care team once we have the MRI results.  If it is positive for osteomyelitis then partial toe amputation would be recommended  Edwin Cap, DPM  Accessible via secure chat for questions or concerns.

## 2022-09-08 ENCOUNTER — Other Ambulatory Visit: Payer: Self-pay

## 2022-09-08 ENCOUNTER — Inpatient Hospital Stay (HOSPITAL_COMMUNITY): Payer: Medicare PPO | Admitting: Anesthesiology

## 2022-09-08 ENCOUNTER — Inpatient Hospital Stay (HOSPITAL_COMMUNITY): Payer: Medicare PPO

## 2022-09-08 ENCOUNTER — Encounter (HOSPITAL_COMMUNITY)
Admission: EM | Disposition: A | Discharge status: Home / Self Care | Payer: Self-pay | Source: Home / Self Care | Attending: Family Medicine

## 2022-09-08 DIAGNOSIS — M869 Osteomyelitis, unspecified: Secondary | ICD-10-CM | POA: Diagnosis not present

## 2022-09-08 DIAGNOSIS — E11621 Type 2 diabetes mellitus with foot ulcer: Secondary | ICD-10-CM | POA: Diagnosis not present

## 2022-09-08 DIAGNOSIS — E1169 Type 2 diabetes mellitus with other specified complication: Secondary | ICD-10-CM

## 2022-09-08 DIAGNOSIS — R7989 Other specified abnormal findings of blood chemistry: Secondary | ICD-10-CM

## 2022-09-08 DIAGNOSIS — L089 Local infection of the skin and subcutaneous tissue, unspecified: Secondary | ICD-10-CM | POA: Diagnosis not present

## 2022-09-08 DIAGNOSIS — I10 Essential (primary) hypertension: Secondary | ICD-10-CM

## 2022-09-08 DIAGNOSIS — A419 Sepsis, unspecified organism: Secondary | ICD-10-CM | POA: Diagnosis not present

## 2022-09-08 DIAGNOSIS — Z7984 Long term (current) use of oral hypoglycemic drugs: Secondary | ICD-10-CM

## 2022-09-08 DIAGNOSIS — I2510 Atherosclerotic heart disease of native coronary artery without angina pectoris: Secondary | ICD-10-CM

## 2022-09-08 DIAGNOSIS — R079 Chest pain, unspecified: Secondary | ICD-10-CM | POA: Diagnosis not present

## 2022-09-08 DIAGNOSIS — L97529 Non-pressure chronic ulcer of other part of left foot with unspecified severity: Secondary | ICD-10-CM | POA: Diagnosis not present

## 2022-09-08 HISTORY — PX: AMPUTATION: SHX166

## 2022-09-08 LAB — CBC WITH DIFFERENTIAL/PLATELET
Abs Immature Granulocytes: 0.03 10*3/uL (ref 0.00–0.07)
Basophils Absolute: 0 10*3/uL (ref 0.0–0.1)
Basophils Relative: 0 %
Eosinophils Absolute: 0.1 10*3/uL (ref 0.0–0.5)
Eosinophils Relative: 1 %
HCT: 39.8 % (ref 39.0–52.0)
Hemoglobin: 12.8 g/dL — ABNORMAL LOW (ref 13.0–17.0)
Immature Granulocytes: 1 %
Lymphocytes Relative: 5 %
Lymphs Abs: 0.3 10*3/uL — ABNORMAL LOW (ref 0.7–4.0)
MCH: 28.6 pg (ref 26.0–34.0)
MCHC: 32.2 g/dL (ref 30.0–36.0)
MCV: 89 fL (ref 80.0–100.0)
Monocytes Absolute: 0.2 10*3/uL (ref 0.1–1.0)
Monocytes Relative: 5 %
Neutro Abs: 4.6 10*3/uL (ref 1.7–7.7)
Neutrophils Relative %: 88 %
Platelets: 99 10*3/uL — ABNORMAL LOW (ref 150–400)
RBC: 4.47 MIL/uL (ref 4.22–5.81)
RDW: 14.7 % (ref 11.5–15.5)
WBC: 5.1 10*3/uL (ref 4.0–10.5)
nRBC: 0 % (ref 0.0–0.2)

## 2022-09-08 LAB — BASIC METABOLIC PANEL
Anion gap: 9 (ref 5–15)
BUN: 17 mg/dL (ref 8–23)
CO2: 21 mmol/L — ABNORMAL LOW (ref 22–32)
Calcium: 7.8 mg/dL — ABNORMAL LOW (ref 8.9–10.3)
Chloride: 104 mmol/L (ref 98–111)
Creatinine, Ser: 1.28 mg/dL — ABNORMAL HIGH (ref 0.61–1.24)
GFR, Estimated: >60 mL/min (ref >60)
Glucose, Bld: 133 mg/dL — ABNORMAL HIGH (ref 70–99)
Potassium: 3.5 mmol/L (ref 3.5–5.1)
Sodium: 134 mmol/L — ABNORMAL LOW (ref 135–145)

## 2022-09-08 LAB — ECHOCARDIOGRAM COMPLETE
Area-P 1/2: 4.06 cm2
Height: 68 in
S' Lateral: 3.5 cm
Weight: 3968.28 oz

## 2022-09-08 LAB — LACTIC ACID, PLASMA: Lactic Acid, Venous: 1.2 mmol/L (ref 0.5–1.9)

## 2022-09-08 LAB — GLUCOSE, CAPILLARY
Glucose-Capillary: 148 mg/dL — ABNORMAL HIGH (ref 70–99)
Glucose-Capillary: 111 mg/dL — ABNORMAL HIGH (ref 70–99)
Glucose-Capillary: 100 mg/dL — ABNORMAL HIGH (ref 70–99)
Glucose-Capillary: 149 mg/dL — ABNORMAL HIGH (ref 70–99)
Glucose-Capillary: 157 mg/dL — ABNORMAL HIGH (ref 70–99)

## 2022-09-08 LAB — AEROBIC/ANAEROBIC CULTURE W GRAM STAIN (SURGICAL/DEEP WOUND)

## 2022-09-08 LAB — CULTURE, BLOOD (ROUTINE X 2)
Culture: NO GROWTH
Culture: NO GROWTH
Special Requests: ADEQUATE

## 2022-09-08 SURGERY — AMPUTATION DIGIT
Anesthesia: Monitor Anesthesia Care | Site: Second Toe | Laterality: Left

## 2022-09-08 MED ORDER — FENTANYL CITRATE PF 50 MCG/ML IJ SOSY: 25.0000 ug | PREFILLED_SYRINGE | INTRAMUSCULAR | Status: DC | PRN

## 2022-09-08 MED ORDER — ACETAMINOPHEN 160 MG/5ML PO SOLN: 325.0000 mg | ORAL | Status: DC | PRN

## 2022-09-08 MED ORDER — OXYCODONE HCL 5 MG PO TABS: 5.0000 mg | ORAL_TABLET | Freq: Once | ORAL | Status: DC | PRN

## 2022-09-08 MED ORDER — MIDAZOLAM HCL 2 MG/2ML IJ SOLN
INTRAMUSCULAR | Status: AC
  Filled 2022-09-08: qty 2

## 2022-09-08 MED ORDER — METOPROLOL SUCCINATE ER 25 MG PO TB24
25.0000 mg | ORAL_TABLET | Freq: Every day | ORAL | Status: DC
  Administered 2022-09-08 – 2022-09-13 (×6): 25 mg via ORAL
  Filled 2022-09-08 (×6): qty 1

## 2022-09-08 MED ORDER — ACETAMINOPHEN 325 MG PO TABS: 325.0000 mg | ORAL_TABLET | ORAL | Status: DC | PRN

## 2022-09-08 MED ORDER — OXYCODONE HCL 5 MG/5ML PO SOLN
5.0000 mg | Freq: Once | ORAL | Status: DC | PRN
  Filled 2022-09-08: qty 5

## 2022-09-08 MED ORDER — PROPOFOL 10 MG/ML IV BOLUS
INTRAVENOUS | Status: DC | PRN
  Administered 2022-09-08: 30 mg via INTRAVENOUS

## 2022-09-08 MED ORDER — SALINE SPRAY 0.65 % NA SOLN
1.0000 | NASAL | Status: DC | PRN
  Administered 2022-09-08: 1 via NASAL
  Filled 2022-09-08: qty 44

## 2022-09-08 MED ORDER — VANCOMYCIN HCL 1000 MG IV SOLR
INTRAVENOUS | Status: DC | PRN
  Administered 2022-09-08: 1000 mg via INTRAVENOUS

## 2022-09-08 MED ORDER — FENTANYL CITRATE (PF) 250 MCG/5ML IJ SOLN
INTRAMUSCULAR | Status: AC
  Filled 2022-09-08: qty 5

## 2022-09-08 MED ORDER — AMISULPRIDE (ANTIEMETIC) 5 MG/2ML IV SOLN
10.0000 mg | Freq: Once | INTRAVENOUS | Status: DC | PRN
  Filled 2022-09-08: qty 4

## 2022-09-08 MED ORDER — PROMETHAZINE HCL 25 MG/ML IJ SOLN: 6.2500 mg | INTRAMUSCULAR | Status: DC | PRN

## 2022-09-08 MED ORDER — PERFLUTREN LIPID MICROSPHERE
1.0000 mL | INTRAVENOUS | Status: AC | PRN
  Administered 2022-09-08: 4 mL via INTRAVENOUS

## 2022-09-08 MED ORDER — PROPOFOL 500 MG/50ML IV EMUL
INTRAVENOUS | Status: AC
  Filled 2022-09-08: qty 50

## 2022-09-08 MED ORDER — PROPOFOL 10 MG/ML IV BOLUS
INTRAVENOUS | Status: AC
  Filled 2022-09-08: qty 20

## 2022-09-08 MED ORDER — BUPIVACAINE HCL (PF) 0.25 % IJ SOLN
INTRAMUSCULAR | Status: DC | PRN
  Administered 2022-09-08: 10 mL

## 2022-09-08 MED ORDER — 0.9 % SODIUM CHLORIDE (POUR BTL) OPTIME
TOPICAL | Status: DC | PRN
  Administered 2022-09-08: 1000 mL

## 2022-09-08 MED ORDER — PROPOFOL 500 MG/50ML IV EMUL
INTRAVENOUS | Status: DC | PRN
  Administered 2022-09-08: 20 ug/kg/min via INTRAVENOUS

## 2022-09-08 MED ORDER — PROPOFOL 1000 MG/100ML IV EMUL
INTRAVENOUS | Status: AC
  Filled 2022-09-08: qty 100

## 2022-09-08 MED ORDER — BUPIVACAINE HCL (PF) 0.25 % IJ SOLN
INTRAMUSCULAR | Status: AC
  Filled 2022-09-08: qty 30

## 2022-09-08 MED ORDER — MIDAZOLAM HCL 2 MG/2ML IJ SOLN
INTRAMUSCULAR | Status: DC | PRN
  Administered 2022-09-08: .5 mg via INTRAVENOUS

## 2022-09-08 MED ORDER — VANCOMYCIN HCL 1000 MG IV SOLR
INTRAVENOUS | Status: AC
  Filled 2022-09-08: qty 20

## 2022-09-08 MED ORDER — ACETAMINOPHEN 10 MG/ML IV SOLN
1000.0000 mg | Freq: Once | INTRAVENOUS | Status: DC | PRN
  Filled 2022-09-08: qty 100

## 2022-09-08 SURGICAL SUPPLY — 26 items
BNDG CMPR 5X4 KNIT ELC UNQ LF (GAUZE/BANDAGES/DRESSINGS) ×1
BNDG ELASTIC 4INX 5YD STR LF (GAUZE/BANDAGES/DRESSINGS) IMPLANT
BNDG GAUZE DERMACEA FLUFF 4 (GAUZE/BANDAGES/DRESSINGS) IMPLANT
BNDG GZE DERMACEA 4 6PLY (GAUZE/BANDAGES/DRESSINGS) ×1
COVER SURGICAL LIGHT HANDLE (MISCELLANEOUS) ×1 IMPLANT
DRAPE SURG 17X23 STRL (DRAPES) ×1 IMPLANT
GAUZE SPONGE 4X4 12PLY STRL (GAUZE/BANDAGES/DRESSINGS) IMPLANT
GAUZE XEROFORM 1X8 LF (GAUZE/BANDAGES/DRESSINGS) IMPLANT
GLOVE BIOGEL M 7.0 STRL (GLOVE) ×1 IMPLANT
GLOVE PI ORTHO PRO STRL 7.5 (GLOVE) ×1 IMPLANT
GOWN STRL REUS W/ TWL LRG LVL3 (GOWN DISPOSABLE) ×2 IMPLANT
GOWN STRL REUS W/TWL LRG LVL3 (GOWN DISPOSABLE) ×2
KIT BASIN OR (CUSTOM PROCEDURE TRAY) ×1 IMPLANT
KIT TURNOVER KIT B (KITS) ×1 IMPLANT
NS IRRIG 1000ML POUR BTL (IV SOLUTION) ×1 IMPLANT
PACK ORTHO EXTREMITY (CUSTOM PROCEDURE TRAY) ×1 IMPLANT
PAD ARMBOARD 7.5X6 YLW CONV (MISCELLANEOUS) ×2 IMPLANT
SOL PREP POV-IOD 4OZ 10% (MISCELLANEOUS) ×3 IMPLANT
SPECIMEN JAR SMALL (MISCELLANEOUS) ×1 IMPLANT
SUT ETHILON 3 0 PS 1 (SUTURE) ×1 IMPLANT
SYR CONTROL 10ML LL (SYRINGE) IMPLANT
TOWEL GREEN STERILE (TOWEL DISPOSABLE) ×1 IMPLANT
TOWEL GREEN STERILE FF (TOWEL DISPOSABLE) ×1 IMPLANT
TUBE CONNECTING 12X1/4 (SUCTIONS) IMPLANT
UNDERPAD 30X36 HEAVY ABSORB (UNDERPADS AND DIAPERS) ×1 IMPLANT
WATER STERILE IRR 1000ML POUR (IV SOLUTION) ×1 IMPLANT

## 2022-09-08 NOTE — Progress Notes (Signed)
Orthopedic Tech Progress Note Patient Details:  [name] [birth year] [id number]  Ortho Devices Type of Ortho Device: Postop shoe/boot Ortho Device/Splint Location: LLE Ortho Device/Splint Interventions: Ordered      Bella Kennedy A Kaylee Trivett 09/08/2022, 4:34 PM

## 2022-09-08 NOTE — Anesthesia Preprocedure Evaluation (Addendum)
Anesthesia Evaluation  Patient identified by MRN, date of birth, ID band Patient awake    Reviewed: Allergy & Precautions, NPO status , Patient's Chart, lab work & pertinent test resultsPreop documentation limited or incomplete due to emergent nature of procedure.  Airway Mallampati: III  TM Distance: <3 FB     Dental  (+) Teeth Intact, Dental Advisory Given   Pulmonary sleep apnea    breath sounds clear to auscultation       Cardiovascular hypertension, Pt. on medications + CAD   Rhythm:Regular Rate:Normal     Neuro/Psych negative neurological ROS     GI/Hepatic Neg liver ROS,GERD  Medicated,,  Endo/Other  diabetes, Type 2, Oral Hypoglycemic Agents    Renal/GU negative Renal ROS     Musculoskeletal  (+) Arthritis ,    Abdominal   Peds  Hematology   Anesthesia Other Findings   Reproductive/Obstetrics                             Anesthesia Physical Anesthesia Plan  ASA: 3 and emergent  Anesthesia Plan: MAC   Post-op Pain Management: Tylenol PO (pre-op)*   Induction: Intravenous  PONV Risk Score and Plan: 2 and Ondansetron, Propofol infusion and Midazolam  Airway Management Planned: Natural Airway and Simple Face Mask  Additional Equipment: None  Intra-op Plan:   Post-operative Plan:   Informed Consent: I have reviewed the patients History and Physical, chart, labs and discussed the procedure including the risks, benefits and alternatives for the proposed anesthesia with the patient or authorized representative who has indicated his/her understanding and acceptance.       Plan Discussed with: CRNA  Anesthesia Plan Comments:        Anesthesia Quick Evaluation

## 2022-09-08 NOTE — Op Note (Signed)
Patient Name: [name] DOB: 1956/02/27  MRN: [id number]   Date of Service: 09/05/2022 - 09/08/2022  Surgeon: Dr. Sharl Ma, DPM Assistants: None Pre-operative Diagnosis:  Left Foot Ulcer Osteomyelitis left 2nd toe Post-operative Diagnosis:  Left Foot Ulcer Osteomyelitis left 2nd toe Procedures: Procedures:   * AMPUTATION LEFT 2nd TOE Pathology/Specimens: ID Type Source Tests Collected by Time Destination  1 : Left Second Toe Tissue PATH Digit amputation SURGICAL PATHOLOGY Edwin Cap, DPM 09/08/2022 1405   A : Left Second Toe Bone Culture Tissue PATH Digit amputation GRAM STAIN, FUNGUS CULTURE WITH STAIN, AEROBIC/ANAEROBIC CULTURE W GRAM STAIN (SURGICAL/DEEP WOUND) Edwin Cap, DPM 09/08/2022 1406    Anesthesia: MAC with local Hemostasis: * No tourniquets in log * Estimated Blood Loss: 5 mL Materials: * No implants in log * Medications: 10 cc 0.25% Marcaine plain Complications: No complication noted  Indications for Procedure:  This is a 67 y.o. male with a history of type 2 diabetes and ulceration on the left second toe, he was admitted for sepsis due to the infection.  MRI was completed and showed osteomyelitis.  Amputation of the distal phalanx was recommended.   Procedure in Detail: Patient was identified in pre-operative holding area. Formal consent was signed and the left lower extremity was marked. Patient was brought back to the operating room. Anesthesia was induced. The extremity was prepped and draped in the usual sterile fashion. Timeout was taken to confirm patient name, laterality, and procedure prior to incision.   Attention was then directed to the left second toe where a fishmouth style incision was made around the distal interphalangeal joint.  This was carried down to the level of the interphalangeal joint and the collateral ligaments were released.  The distal phalanx and soft tissue attached to it was sent for pathology.  A culture of the distal  head of the distal phalanx was taken and sent for tissue culture.  The wound was irrigated thoroughly.  No further infection remained.  Perfusion was excellent.  Hemostasis was achieved and the wound was closed with 3-0 Monocryl and 4-0 nylon.  The foot was then dressed with Xeroform and dry sterile dressings. Patient tolerated the procedure well.   Disposition: Following a period of post-operative monitoring, patient will be transferred to the floor.  Would recommend continuing broad-spectrum IV antibiotics until he is shows systemic signs of improvement and we have culture data.  He received 1 g of vancomycin preoperatively.  He may be WBAT in a postop shoe.  No dressing changes until outpatient follow-up.  May be discharged when clinically improving.

## 2022-09-08 NOTE — Anesthesia Postprocedure Evaluation (Signed)
Anesthesia Post Note  Patient: [name]  Procedure(s) Performed: AMPUTATION LEFT 2nd TOE (Left: Second Toe)     Patient location during evaluation: PACU Anesthesia Type: MAC Level of consciousness: awake and alert Pain management: pain level controlled Vital Signs Assessment: post-procedure vital signs reviewed and stable Respiratory status: spontaneous breathing, nonlabored ventilation, respiratory function stable and patient connected to nasal cannula oxygen Cardiovascular status: stable and blood pressure returned to baseline Postop Assessment: no apparent nausea or vomiting Anesthetic complications: no  No notable events documented.  Last Vitals:  Vitals:   09/08/22 1456 09/08/22 1515  BP: 136/72 130/68  Pulse: 71   Resp: (!) 25 20  Temp: 37.7 C (!) 38 C  SpO2: 93% 92%    Last Pain:  Vitals:   09/08/22 1515  TempSrc: Oral  PainSc:                  Shelton Silvas

## 2022-09-08 NOTE — Plan of Care (Signed)
  Problem: Coping: Goal: Level of anxiety will decrease Outcome: Progressing   Problem: Elimination: Goal: Will not experience complications related to bowel motility Outcome: Progressing Goal: Will not experience complications related to urinary retention Outcome: Progressing   Problem: Pain Managment: Goal: General experience of comfort will improve Outcome: Progressing   

## 2022-09-08 NOTE — Transfer of Care (Signed)
Immediate Anesthesia Transfer of Care Note  Patient: [name]  Procedure(s) Performed: AMPUTATION LEFT 2nd TOE (Left: Second Toe)  Patient Location: PACU  Anesthesia Type:MAC  Level of Consciousness: awake, alert , oriented, and patient cooperative  Airway & Oxygen Therapy: Patient Spontanous Breathing and Patient connected to nasal cannula oxygen  Post-op Assessment: Report given to RN and Post -op Vital signs reviewed and stable  Post vital signs: Reviewed and stable  Last Vitals:  Vitals Value Taken Time  BP 121/65 09/08/22 1425  Temp    Pulse 77 09/08/22 1429  Resp 29 09/08/22 1429  SpO2 95 % 09/08/22 1429  Vitals shown include unvalidated device data.  Last Pain:  Vitals:   09/08/22 1127  TempSrc: Oral  PainSc:       Patients Stated Pain Goal: 0 (09/06/22 2203)  Complications: No notable events documented.

## 2022-09-08 NOTE — Progress Notes (Signed)
AMPUTATION LEFT 2nd TOE (Left) planned for today. TOC following for needs.

## 2022-09-08 NOTE — Progress Notes (Signed)
History and Physical Interval Note:  09/08/2022 12:58 PM  [name]  has presented today for surgery, with the diagnosis of osteomyelitis of the left second toe.  The various methods of treatment have been discussed with the patient and family. After consideration of risks, benefits and other options for treatment, the patient has consented to   Procedure(s): AMPUTATION LEFT 2nd TOE (Left) as a surgical intervention.  The patient's history has been reviewed, patient examined, no change in status, stable for surgery.  I have reviewed the patient's chart and labs.  Questions were answered to the patient's satisfaction.  We discussed the results of his MRI.  Today surgery is of an emergent nature due to his sepsis and systemic symptoms consistent with bacteremia ongoing for 3 days, further delay in surgery put him at risk for loss of limb or life.   Edwin Cap

## 2022-09-08 NOTE — Progress Notes (Signed)
Rounding Note    Patient Name: [name] Date of Encounter: 09/08/2022  [facility] Cardiologist: Thurmon Fair, MD   Subjective   No chest pain or shortness of breath since admission  Inpatient Medications    Scheduled Meds:  aspirin EC  325 mg Oral QHS   atorvastatin  80 mg Oral QHS   enoxaparin (LOVENOX) injection  40 mg Subcutaneous Q24H   insulin aspart  0-9 Units Subcutaneous TID WC   isosorbide mononitrate  120 mg Oral Daily   metoprolol succinate  50 mg Oral Daily   pantoprazole  40 mg Oral Daily   pregabalin  150 mg Oral BID   tamsulosin  0.4 mg Oral QHS   Continuous Infusions:  sodium chloride Stopped (09/07/22 0559)   lactated ringers 125 mL/hr at 09/07/22 1725   piperacillin-tazobactam (ZOSYN)  IV 3.375 g (09/08/22 0534)   PRN Meds: acetaminophen **OR** acetaminophen, albuterol, nitroGLYCERIN, ondansetron **OR** ondansetron (ZOFRAN) IV, polyethylene glycol   Vital Signs    Vitals:   09/07/22 2250 09/07/22 2330 09/07/22 2351 09/08/22 0302  BP:  (!) 101/55  (!) 100/51  Pulse:  72    Resp: 17 19 16 18   Temp:  98.3 F (36.8 C)  100.2 F (37.9 C)  TempSrc:  Oral  Oral  SpO2:  92% 95% 99%  Weight:      Height:        Intake/Output Summary (Last 24 hours) at 09/08/2022 0721 Last data filed at 09/08/2022 0500 Gross per 24 hour  Intake 2380.99 ml  Output 1175 ml  Net 1205.99 ml      09/05/2022    4:07 PM 09/05/2022   10:55 AM 03/03/2022    2:00 PM  Last 3 Weights  Weight (lbs) 248 lb 0.3 oz 248 lb 257 lb  Weight (kg) 112.5 kg 112.492 kg 116.574 kg      Telemetry    Sinus rhythm- Personally Reviewed  ECG    Not performed today- Personally Reviewed  Physical Exam   GEN: No acute distress.   Neck: No JVD Cardiac: RRR, no murmurs, rubs, or gallops.  Respiratory: Clear to auscultation bilaterally. GI: Soft, nontender, non-distended  MS: No edema; No deformity. Neuro:  Nonfocal  Psych: Normal affect   Labs    High  Sensitivity Troponin:   Recent Labs  Lab 09/07/22 1423 09/07/22 1611  TROPONINIHS 1,200* 1,241*     Chemistry Recent Labs  Lab 09/06/22 0013 09/07/22 0019 09/07/22 1424 09/08/22 0020  NA 137 134* 133* 134*  K 3.2* 3.5 3.7 3.5  CL 100 105 104 104  CO2 22 21* 20* 21*  GLUCOSE 113* 136* 117* 133*  BUN 14 17 17 17   CREATININE 1.22 1.08 1.27* 1.28*  CALCIUM 8.3* 8.0* 7.8* 7.8*  PROT 5.9* 5.8*  --   --   ALBUMIN 2.9* 2.7*  --   --   [name] 26 38  --   --   ALT 21 33  --   --   ALKPHOS 58 90  --   --   BILITOT 1.0 1.7*  --   --   GFRNONAA >60 >60 >60 >60  ANIONGAP 15 8 9 9     Lipids No results for input(s): "CHOL", "TRIG", "HDL", "LABVLDL", "LDLCALC", "CHOLHDL" in the last 168 hours.  Hematology Recent Labs  Lab 09/07/22 0019 09/07/22 1424 09/08/22 0020  WBC 6.5 5.6 5.1  RBC 4.98 4.73 4.47  HGB 14.3 13.5 12.8*  HCT 44.1 40.9 39.8  MCV 88.6 86.5 89.0  MCH 28.7 28.5 28.6  MCHC 32.4 33.0 32.2  RDW 14.6 14.6 14.7  PLT 104* 102* 99*   Thyroid No results for input(s): "TSH", "FREET4" in the last 168 hours.  BNPNo results for input(s): "BNP", "PROBNP" in the last 168 hours.  DDimer No results for input(s): "DDIMER" in the last 168 hours.   Radiology    MR FOOT LEFT W WO CONTRAST  Result Date: 09/07/2022 CLINICAL DATA:  Foot ulcer on second toe, concern for osteomyelitis EXAM: MRI OF THE LEFT FOREFOOT WITHOUT AND WITH CONTRAST TECHNIQUE: Multiplanar, multisequence MR imaging of the left forefoot was performed both before and after administration of intravenous contrast. CONTRAST:  10mL GADAVIST GADOBUTROL 1 MMOL/ML IV SOLN COMPARISON:  Radiographs dated Sep 05, 2018 FINDINGS: Bones/Joint/Cartilage Bone marrow edema of the distal phalanx of the second digit with enhancement on post contrast sequences concerning for osteomyelitis. Marrow signal within remaining osseous structures is within normal limits. No evidence of acute fracture or dislocation. Ligaments Lisfranc and  collateral ligaments appear intact. Muscles and Tendons Increased intramuscular signal of the plantar muscles suggesting myopathy/myositis. Flexor and extensor tendons appear intact. Soft tissues Skin irregularity and inflammatory changes about the distal phalanx of the second digit suggesting open wound. Soft tissue edema about the dorsum of the foot without evidence of fluid collection or abscess. IMPRESSION: 1. Bone marrow edema of the distal phalanx of the second digit with enhancement on post contrast sequences concerning for osteomyelitis. 2. Skin irregularity and inflammatory changes about the distal phalanx of the second digit suggesting open wound. 3. Soft tissue edema about the dorsum of the foot without evidence of fluid collection or abscess. 4. Increased intramuscular signal of the plantar muscles suggesting myopathy/myositis. Electronically Signed   By: Larose Hires D.O.   On: 09/07/2022 22:49   DG CHEST PORT 1 VIEW  Result Date: 09/07/2022 CLINICAL DATA:  Pneumonia. EXAM: PORTABLE CHEST 1 VIEW COMPARISON:  09/05/2022 FINDINGS: Stable cardiomediastinal contours. Unchanged hazy opacity in the left base which may reflect atelectasis or pneumonia. No signs of pleural effusion or interstitial edema. The visualized osseous structures appear unremarkable. IMPRESSION: Persistent hazy opacity in the left base compatible with atelectasis or pneumonia. Electronically Signed   By: Signa Kell M.D.   On: 09/07/2022 15:07    Cardiac Studies   None  Patient Profile     [name] is a 67 y.o. male with a hx of CAD who is being seen 09/07/2022 for the evaluation of elevated troponin and chest pain at the request of Dr. Hughie Closs.   Assessment & Plan    1: Non-STEMI-we are asked to see patient because elevated troponins in the 1200 range in the setting of sepsis.  He did have chest pain when he came in but none since admission.  He says he has not had chest pain in years.  He has known chronic  stable CAD.  I believe his elevated troponin was demand ischemia from sepsis.  Await 2D echo ordered by Dr. Royann Shivers.  His last stress test in 2022 showed scar without ischemia.  2: Sepsis-patient has ulceration on left second toe.  He is on Zosyn.  He feels better since admission.  He has a low-grade fever this morning.  His initial lactate was 2.1, decreased to 1.5.  If his 2D echo shows no new wall motion abnormalities will sign off and arrange outpatient follow-up with Dr. Royann Shivers.  No invasive evaluation is being considered at this  time.     For questions or updates, please contact [facility] Please consult [email or web address] for contact info under        Signed, Nanetta Batty, MD  09/08/2022, 7:21 AM

## 2022-09-08 NOTE — Progress Notes (Signed)
  Echocardiogram 2D Echocardiogram has been performed.  [name] 09/08/2022, 10:21 AM

## 2022-09-08 NOTE — Progress Notes (Addendum)
PROGRESS NOTE    [name]  ZOX:[id number] DOB: 12-20-1955 DOA: 09/05/2022 PCP: Emilio Aspen, MD   Brief Narrative:  [name] is a 67 y.o. male with medical history significant of  CAD s/p PCI, DM-2, HTN, HLD, peripheral neuropathy-who [facility] for evaluation of fever or chills.  While in the ED, patient had transient hypotension which resolved with IV fluid bolus.  Patient was eventually admitted to hospitalist service with a diagnosis of sepsis presumed to be secondary to diabetic foot ulcer vs osteomyelitis.  Patient received antibiotics in the ED.  Assessment & Plan:   Principal Problem:   Sepsis due to skin infection (HCC) Active Problems:   CAD (coronary artery disease), native coronary artery   Essential hypertension   Hyperlipidemia   Polyneuropathy due to type 2 diabetes mellitus (HCC)   Type 2 diabetes mellitus with left diabetic foot infection (HCC)   Foot ulcer due to secondary DM (HCC)  Sepsis secondary to left second toe osteomyelitis and grade 2 diabetic foot infection/left foot cellulitis POA: Patient met sepsis criteria based on fever 102.3 and tachypnea.  MRI left foot concerning for distal phalanx second toe left foot osteomyelitis.  Patient's clinical picture consistent with bacteremia but fortunately his blood cultures negative.  Patient was initially on Bactrim DS but due to suspicion of possible pneumonia, he was transitioned to Zosyn on the afternoon of 09/07/2022.  Podiatry following, will likely need amputation.  Defer to podiatry for that.  Last temperature spike of 101.4 at 9 PM 09/07/2022.  Acute respiratory failure with hypoxia community-acquired pneumonia: Initial chest x-ray negative at admission but since patient developed persistent fever with chills and became hypoxic, repeat chest x-ray was done on 09/07/2022 which showed left lower lobe pneumonia.  Antibiotics transition from Bactrim to Zosyn.  Hypokalemia:  Resolved  History of CAD s/p remote PCI: Patient did complain of chest pain on the morning of 09/07/2022.  EKG was done which did not show any acute ST-T wave changes.  Troponins were checked later in the day which were found to be around 1200 but stable.  Cardiology consulted, per them, this is likely demand ischemia in the setting of sepsis.  No heparin was recommended.  AKI: Patient developed AKI on 09/07/2022.  Started on fluids.  Creatinine stable.  Will continue fluids and repeat labs in the morning.  Type 2 diabetes mellitus: Last hemoglobin A1c October 2023 was 7.4.  Hold oral hypoglycemics.  Continue SSI.  Blood sugar controlled.  Hemoglobin A1c pending.  History of essential hypertension: Blood pressure on the lower side.  Will discontinue Imdur and reduce her Toprol-XL from 50 mg p.o. daily to 25 mg p.o. daily.  Hyperlipidemia: Statin.  Peripheral neuropathy: Continue Lyrica.  DVT prophylaxis: enoxaparin (LOVENOX) injection 40 mg Start: 09/05/22 1745   Code Status: Full Code  Family Communication: None present at bedside.  Plan of care discussed with patient in length and he/she verbalized understanding and agreed with it.  Status is: Inpatient Remains inpatient appropriate because: Patient needs surgery.   Estimated body mass index is 37.71 kg/m as calculated from the following:   Height as of this encounter: 5\' 8"  (1.727 m).   Weight as of this encounter: 112.5 kg.    Nutritional Assessment: Body mass index is 37.71 kg/m.Marland Kitchen Seen by dietician.  I agree with the assessment and plan as outlined below: Nutrition Status:        . Skin Assessment: I have examined the patient's  skin and I agree with the wound assessment as performed by the wound care RN as outlined below:    Consultants:  None  Procedures:  None  Antimicrobials:  Anti-infectives (From admission, onward)    Start     Dose/Rate Route Frequency Ordered Stop   09/07/22 1600  piperacillin-tazobactam  (ZOSYN) IVPB 3.375 g  Status:  Discontinued        3.375 g 100 mL/hr over 30 Minutes Intravenous Every 8 hours 09/07/22 1511 09/07/22 1513   09/07/22 1600  piperacillin-tazobactam (ZOSYN) IVPB 3.375 g        3.375 g 12.5 mL/hr over 240 Minutes Intravenous Every 8 hours 09/07/22 1514     09/06/22 1500  vancomycin (VANCOREADY) IVPB 1750 mg/350 mL  Status:  Discontinued        1,750 mg 175 mL/hr over 120 Minutes Intravenous Every 24 hours 09/05/22 1250 09/05/22 1717   09/06/22 1200  sulfamethoxazole-trimethoprim (BACTRIM DS) 800-160 MG per tablet 2 tablet  Status:  Discontinued        2 tablet Oral Every 12 hours 09/06/22 0958 09/07/22 1511   09/05/22 1400  vancomycin (VANCOCIN) IVPB 1000 mg/200 mL premix       See Hyperspace for full Linked Orders Report.   1,000 mg 200 mL/hr over 60 Minutes Intravenous  Once 09/05/22 1247 09/05/22 1601   09/05/22 1300  ceFEPIme (MAXIPIME) 2 g in sodium chloride 0.9 % 100 mL IVPB  Status:  Discontinued        2 g 200 mL/hr over 30 Minutes Intravenous Every 8 hours 09/05/22 1247 09/05/22 1649   09/05/22 1300  metroNIDAZOLE (FLAGYL) IVPB 500 mg        500 mg 100 mL/hr over 60 Minutes Intravenous  Once 09/05/22 1247 09/05/22 1456   09/05/22 1300  vancomycin (VANCOCIN) IVPB 1000 mg/200 mL premix       See Hyperspace for full Linked Orders Report.   1,000 mg 200 mL/hr over 60 Minutes Intravenous  Once 09/05/22 1247 09/05/22 1443         Subjective: Patient seen and examined.  He says that he feels much better today.  Denies shivering.  Still appears slightly diaphoretic.  Denies any nausea, vomiting, chest pain, shortness of breath, any problem with urination or with bowel movements.  Overall looks better than yesterday.  Objective: Vitals:   09/07/22 2330 09/07/22 2351 09/08/22 0302 09/08/22 0737  BP: (!) 101/55  (!) 100/51 (!) 97/56  Pulse: 72   72  Resp: 19 16 18 16   Temp: 98.3 F (36.8 C)  100.2 F (37.9 C) 98.1 F (36.7 C)  TempSrc: Oral   Oral Oral  SpO2: 92% 95% 99% 96%  Weight:      Height:        Intake/Output Summary (Last 24 hours) at 09/08/2022 0744 Last data filed at 09/08/2022 0500 Gross per 24 hour  Intake 2380.99 ml  Output 1175 ml  Net 1205.99 ml    Filed Weights   09/05/22 1055 09/05/22 1607  Weight: 112.5 kg 112.5 kg    Examination:  General exam: Appears calm and comfortable  Respiratory system: Clear to auscultation. Respiratory effort normal. Cardiovascular system: S1 & S2 heard, RRR. No JVD, murmurs, rubs, gallops or clicks. No pedal edema. Gastrointestinal system: Abdomen is nondistended, soft and nontender. No organomegaly or masses felt. Normal bowel sounds heard. Central nervous system: Alert and oriented. No focal neurological deficits. Extremities: Symmetric 5 x 5 power. Skin: No rashes, lesions or ulcers.  Psychiatry: Judgement and insight appear normal. Mood & affect appropriate.   Data Reviewed: I have personally reviewed following labs and imaging studies  CBC: Recent Labs  Lab 09/05/22 1946 09/06/22 0013 09/07/22 0019 09/07/22 1424 09/08/22 0020  WBC 10.4 9.7 6.5 5.6 5.1  NEUTROABS  --   --  5.8 5.1 4.6  HGB 15.1 14.3 14.3 13.5 12.8*  HCT 46.1 44.3 44.1 40.9 39.8  MCV 86.3 88.8 88.6 86.5 89.0  PLT 108* 107* 104* 102* 99*    Basic Metabolic Panel: Recent Labs  Lab 09/05/22 1122 09/05/22 1818 09/06/22 0013 09/07/22 0019 09/07/22 1424 09/08/22 0020  NA 138  --  137 134* 133* 134*  K 3.7  --  3.2* 3.5 3.7 3.5  CL 102  --  100 105 104 104  CO2 24  --  22 21* 20* 21*  GLUCOSE 157*  --  113* 136* 117* 133*  BUN 17  --  14 17 17 17   CREATININE 1.02 1.30* 1.22 1.08 1.27* 1.28*  CALCIUM 9.2  --  8.3* 8.0* 7.8* 7.8*    GFR: Estimated Creatinine Clearance: 69.1 mL/min (A) (by C-G formula based on SCr of 1.28 mg/dL (H)). Liver Function Tests: Recent Labs  Lab 09/06/22 0013 09/07/22 0019  [name] 26 38  ALT 21 33  ALKPHOS 58 90  BILITOT 1.0 1.7*  PROT 5.9* 5.8*   ALBUMIN 2.9* 2.7*    No results for input(s): "LIPASE", "AMYLASE" in the last 168 hours. No results for input(s): "AMMONIA" in the last 168 hours. Coagulation Profile: Recent Labs  Lab 09/05/22 1237  INR 1.1    Cardiac Enzymes: No results for input(s): "CKTOTAL", "CKMB", "CKMBINDEX", "TROPONINI" in the last 168 hours. BNP (last 3 results) No results for input(s): "PROBNP" in the last 8760 hours. HbA1C: No results for input(s): "HGBA1C" in the last 72 hours. CBG: Recent Labs  Lab 09/07/22 1121 09/07/22 1359 09/07/22 1609 09/07/22 2124 09/08/22 0615  GLUCAP 131* 116* 124* 184* 148*    Lipid Profile: No results for input(s): "CHOL", "HDL", "LDLCALC", "TRIG", "CHOLHDL", "LDLDIRECT" in the last 72 hours. Thyroid Function Tests: No results for input(s): "TSH", "T4TOTAL", "FREET4", "T3FREE", "THYROIDAB" in the last 72 hours. Anemia Panel: No results for input(s): "VITAMINB12", "FOLATE", "FERRITIN", "TIBC", "IRON", "RETICCTPCT" in the last 72 hours. Sepsis Labs: Recent Labs  Lab 09/05/22 1237 09/05/22 1455 09/07/22 1424  LATICACIDVEN 2.1* 2.1* 1.5     Recent Results (from the past 240 hour(s))  Blood Culture (routine x 2)     Status: None (Preliminary result)   Collection Time: 09/05/22 12:52 PM   Specimen: BLOOD LEFT FOREARM  Result Value Ref Range Status   Specimen Description   Final    BLOOD LEFT FOREARM Performed at [facility], [address] [place], [place] [address]    Special Requests   Final    BOTTLES DRAWN AEROBIC AND ANAEROBIC Blood Culture adequate volume Performed at [facility], [address] [place], [place] [address]    Culture   Final    NO GROWTH 2 DAYS Performed at [facility], [address] [place], [place] [address]    Report Status PENDING  Incomplete  Blood Culture (routine x 2)     Status: None (Preliminary result)   Collection Time: 09/05/22 12:57 PM   Specimen: BLOOD  Result Value Ref  Range Status   Specimen Description   Final    BLOOD BLOOD RIGHT WRIST Performed at [facility], [name] [place], [place] [address]    Special Requests   Final    BOTTLES DRAWN AEROBIC AND ANAEROBIC Blood Culture adequate volume Performed at [facility], [address], [place], [place] [address]    Culture   Final    NO GROWTH 2 DAYS Performed at [facility], [address], [place], [place] [address]    Report Status PENDING  Incomplete  SARS Coronavirus 2 by RT PCR (hospital order, performed in [facility] hospital lab) *cepheid single result test* Anterior Nasal Swab     Status: None   Collection Time: 09/05/22  2:19 PM   Specimen: Anterior Nasal Swab  Result Value Ref Range Status   SARS Coronavirus 2 by RT PCR NEGATIVE NEGATIVE Final    Comment: (NOTE) SARS-CoV-2 target nucleic acids are NOT DETECTED.  The SARS-CoV-2 RNA is generally detectable in upper and lower respiratory specimens during the acute phase of infection. The lowest concentration of SARS-CoV-2 viral copies this assay can detect is 250 copies / mL. A negative result does not preclude SARS-CoV-2 infection and should not be used as the sole basis for treatment or other patient management decisions.  A negative result may occur with improper specimen collection / handling, submission of specimen other than nasopharyngeal swab, presence of viral mutation(s) within the areas targeted by this assay, and inadequate number of viral copies (<250 copies / mL). A negative result must be combined with clinical observations, patient history, and epidemiological information.  Fact Sheet for Patients:   RoadLapTop.co.za  Fact Sheet for Healthcare Providers: http://kim-miller.com/  This test is not yet approved or  cleared by the [place] FDA and has been authorized for detection and/or diagnosis of SARS-CoV-2  by FDA under an Emergency Use Authorization (EUA).  This EUA will remain in effect (meaning this test can be used) for the duration of the COVID-19 declaration under Section 564(b)(1) of the Act, 21 U.S.C. section 360bbb-3(b)(1), unless the authorization is terminated or revoked sooner.  Performed at [facility], [address], [place], [place] [address]   Respiratory (~20 pathogens) panel by PCR     Status: None   Collection Time: 09/07/22  3:21 PM   Specimen: Nasopharyngeal Swab; Respiratory  Result Value Ref Range Status   Adenovirus NOT DETECTED NOT DETECTED Final   Coronavirus 229E NOT DETECTED NOT DETECTED Final    Comment: (NOTE) The Coronavirus on the Respiratory Panel, DOES NOT test for the novel  Coronavirus (2019 nCoV)    Coronavirus HKU1 NOT DETECTED NOT DETECTED Final   Coronavirus NL63 NOT DETECTED NOT DETECTED Final   Coronavirus OC43 NOT DETECTED NOT DETECTED Final   Metapneumovirus NOT DETECTED NOT DETECTED Final   Rhinovirus / Enterovirus NOT DETECTED NOT DETECTED Final   Influenza A NOT DETECTED NOT DETECTED Final   Influenza B NOT DETECTED NOT DETECTED Final   Parainfluenza Virus 1 NOT DETECTED NOT DETECTED Final   Parainfluenza Virus 2 NOT DETECTED NOT DETECTED Final   Parainfluenza Virus 3 NOT DETECTED NOT DETECTED Final   Parainfluenza Virus 4 NOT DETECTED NOT DETECTED Final   Respiratory Syncytial Virus NOT DETECTED NOT DETECTED Final   Bordetella pertussis NOT DETECTED NOT DETECTED Final   Bordetella Parapertussis NOT DETECTED NOT DETECTED Final   Chlamydophila pneumoniae NOT DETECTED NOT DETECTED Final   Mycoplasma pneumoniae NOT DETECTED NOT DETECTED Final    Comment: Performed at [facility], [address], [place], [place] [address]  Radiology Studies: MR FOOT LEFT W WO CONTRAST  Result Date: 09/07/2022 CLINICAL DATA:  Foot ulcer on second toe, concern for osteomyelitis EXAM: MRI OF THE LEFT FOREFOOT WITHOUT AND  WITH CONTRAST TECHNIQUE: Multiplanar, multisequence MR imaging of the left forefoot was performed both before and after administration of intravenous contrast. CONTRAST:  10mL GADAVIST GADOBUTROL 1 MMOL/ML IV SOLN COMPARISON:  Radiographs dated Sep 05, 2018 FINDINGS: Bones/Joint/Cartilage Bone marrow edema of the distal phalanx of the second digit with enhancement on post contrast sequences concerning for osteomyelitis. Marrow signal within remaining osseous structures is within normal limits. No evidence of acute fracture or dislocation. Ligaments Lisfranc and collateral ligaments appear intact. Muscles and Tendons Increased intramuscular signal of the plantar muscles suggesting myopathy/myositis. Flexor and extensor tendons appear intact. Soft tissues Skin irregularity and inflammatory changes about the distal phalanx of the second digit suggesting open wound. Soft tissue edema about the dorsum of the foot without evidence of fluid collection or abscess. IMPRESSION: 1. Bone marrow edema of the distal phalanx of the second digit with enhancement on post contrast sequences concerning for osteomyelitis. 2. Skin irregularity and inflammatory changes about the distal phalanx of the second digit suggesting open wound. 3. Soft tissue edema about the dorsum of the foot without evidence of fluid collection or abscess. 4. Increased intramuscular signal of the plantar muscles suggesting myopathy/myositis. Electronically Signed   By: Larose Hires D.O.   On: 09/07/2022 22:49   DG CHEST PORT 1 VIEW  Result Date: 09/07/2022 CLINICAL DATA:  Pneumonia. EXAM: PORTABLE CHEST 1 VIEW COMPARISON:  09/05/2022 FINDINGS: Stable cardiomediastinal contours. Unchanged hazy opacity in the left base which may reflect atelectasis or pneumonia. No signs of pleural effusion or interstitial edema. The visualized osseous structures appear unremarkable. IMPRESSION: Persistent hazy opacity in the left base compatible with atelectasis or  pneumonia. Electronically Signed   By: Signa Kell M.D.   On: 09/07/2022 15:07    Scheduled Meds:  aspirin EC  325 mg Oral QHS   atorvastatin  80 mg Oral QHS   enoxaparin (LOVENOX) injection  40 mg Subcutaneous Q24H   insulin aspart  0-9 Units Subcutaneous TID WC   isosorbide mononitrate  120 mg Oral Daily   metoprolol succinate  50 mg Oral Daily   pantoprazole  40 mg Oral Daily   pregabalin  150 mg Oral BID   tamsulosin  0.4 mg Oral QHS   Continuous Infusions:  sodium chloride Stopped (09/07/22 0559)   lactated ringers 125 mL/hr at 09/07/22 1725   piperacillin-tazobactam (ZOSYN)  IV 3.375 g (09/08/22 0534)     LOS: 3 days   Hughie Closs, MD [facility]  09/08/2022, 7:44 AM   *Please note that this is a verbal dictation therefore any spelling or grammatical errors are due to the "Dragon Medical One" system interpretation.  Please page via Amion and do not message via secure chat for urgent patient care matters. Secure chat can be used for non urgent patient care matters.  How to contact the [facility] Attending or Consulting provider 7A - 7P or covering provider during after hours 7P -7A, for this patient?  Check the care team in [facility] and look for a) attending/consulting [facility] provider listed and b) the [facility] team listed. Page or secure chat 7A-7P. Log into [email or web address] and use [facility]'s universal password to access. If you do not have the password, please contact the hospital operator. Locate the [facility] provider you are looking for under [facility] and page to a  number that you can be directly reached. If you still have difficulty reaching the provider, please page the [facility] (Director on Call) for the Hospitalists listed on amion for assistance.

## 2022-09-08 NOTE — Progress Notes (Signed)
Patient being transported to OR at this time.

## 2022-09-09 ENCOUNTER — Encounter (HOSPITAL_COMMUNITY): Payer: Self-pay | Admitting: Podiatry

## 2022-09-09 DIAGNOSIS — L089 Local infection of the skin and subcutaneous tissue, unspecified: Secondary | ICD-10-CM | POA: Diagnosis not present

## 2022-09-09 DIAGNOSIS — A419 Sepsis, unspecified organism: Secondary | ICD-10-CM | POA: Diagnosis not present

## 2022-09-09 LAB — BASIC METABOLIC PANEL
Anion gap: 7 (ref 5–15)
BUN: 14 mg/dL (ref 8–23)
CO2: 23 mmol/L (ref 22–32)
Calcium: 8 mg/dL — ABNORMAL LOW (ref 8.9–10.3)
Chloride: 103 mmol/L (ref 98–111)
Creatinine, Ser: 1.13 mg/dL (ref 0.61–1.24)
GFR, Estimated: >60 mL/min (ref >60)
Glucose, Bld: 153 mg/dL — ABNORMAL HIGH (ref 70–99)
Potassium: 3.5 mmol/L (ref 3.5–5.1)
Sodium: 133 mmol/L — ABNORMAL LOW (ref 135–145)

## 2022-09-09 LAB — GLUCOSE, CAPILLARY
Glucose-Capillary: 108 mg/dL — ABNORMAL HIGH (ref 70–99)
Glucose-Capillary: 138 mg/dL — ABNORMAL HIGH (ref 70–99)
Glucose-Capillary: 199 mg/dL — ABNORMAL HIGH (ref 70–99)
Glucose-Capillary: 144 mg/dL — ABNORMAL HIGH (ref 70–99)
Glucose-Capillary: 172 mg/dL — ABNORMAL HIGH (ref 70–99)

## 2022-09-09 LAB — CBC WITH DIFFERENTIAL/PLATELET
Abs Immature Granulocytes: 0.01 10*3/uL (ref 0.00–0.07)
Basophils Absolute: 0 10*3/uL (ref 0.0–0.1)
Basophils Relative: 0 %
Eosinophils Absolute: 0.1 10*3/uL (ref 0.0–0.5)
Eosinophils Relative: 4 %
HCT: 39.7 % (ref 39.0–52.0)
Hemoglobin: 13.3 g/dL (ref 13.0–17.0)
Immature Granulocytes: 0 %
Lymphocytes Relative: 6 %
Lymphs Abs: 0.2 10*3/uL — ABNORMAL LOW (ref 0.7–4.0)
MCH: 29.3 pg (ref 26.0–34.0)
MCHC: 33.5 g/dL (ref 30.0–36.0)
MCV: 87.4 fL (ref 80.0–100.0)
Monocytes Absolute: 0.2 10*3/uL (ref 0.1–1.0)
Monocytes Relative: 7 %
Neutro Abs: 2.6 10*3/uL (ref 1.7–7.7)
Neutrophils Relative %: 83 %
Platelets: 93 10*3/uL — ABNORMAL LOW (ref 150–400)
RBC: 4.54 MIL/uL (ref 4.22–5.81)
RDW: 14.7 % (ref 11.5–15.5)
WBC: 3.2 10*3/uL — ABNORMAL LOW (ref 4.0–10.5)
nRBC: 0 % (ref 0.0–0.2)

## 2022-09-09 LAB — CULTURE, BLOOD (ROUTINE X 2): Special Requests: ADEQUATE

## 2022-09-09 LAB — HEMOGLOBIN A1C
Hgb A1c MFr Bld: 7.5 % — ABNORMAL HIGH (ref 4.8–5.6)
Hgb A1c MFr Bld: 7.3 % — ABNORMAL HIGH (ref 4.8–5.6)
Mean Plasma Glucose: 169 mg/dL
Mean Plasma Glucose: 163 mg/dL

## 2022-09-09 LAB — PROCALCITONIN: Procalcitonin: 3.02 ng/mL

## 2022-09-09 NOTE — Progress Notes (Signed)
Subjective: 1 Day Post-Op Procedure(s) (LRB): AMPUTATION LEFT 2nd TOE (Left) DOS: 09/08/2022  Patient feeling well and resting comfortably this evening.  Dressings are clean dry and intact.  Objective: Vital signs in last 24 hours: Temp:  [98 F (36.7 C)-101.4 F (38.6 C)] 98.6 F (37 C) (05/28 1917) Pulse Rate:  [69-86] 69 (05/28 1917) Resp:  [19-26] 20 (05/28 1917) BP: (116-152)/(68-97) 146/77 (05/28 1917) SpO2:  [88 %-98 %] 93 % (05/28 1917)  Recent Labs    09/07/22 0019 09/07/22 1424 09/08/22 0020 09/09/22 0010  HGB 14.3 13.5 12.8* 13.3   Recent Labs    09/08/22 0020 09/09/22 0010  WBC 5.1 3.2*  RBC 4.47 4.54  HCT 39.8 39.7  PLT 99* 93*   Recent Labs    09/08/22 0020 09/09/22 0010  NA 134* 133*  K 3.5 3.5  CL 104 103  CO2 21* 23  BUN 17 14  CREATININE 1.28* 1.13  GLUCOSE 133* 153*  CALCIUM 7.8* 8.0*   Amputation site stable.  Incision well coapted with sutures intact.  No active bleeding.  No drainage.  Improved erythema localized to the second digit.  Overall well-healing amputation site   Assessment/Plan: 1 Day Post-Op Procedure(s) (LRB): AMPUTATION LEFT 2nd TOE (Left) DOS: 08/09/2022 Surgeon: Dr. Sharl Ma  -Dressings changed.  Leave clean dry and intact until follow-up in office -WBAT surgical shoe.  Patient has a surgical shoe in the room -Prior to admission the patient was placed on oral Bactrim.  Resume upon discharge. -Follow-up in office 1 week postdischarge with Dr. [name] -From a surgical standpoint patient okay for discharge.  Podiatry will sign off  Felecia Shelling 09/09/2022, 9:06 PM  Felecia Shelling, DPM [facility]  Dr. Felecia Shelling, DPM    [address] [place], [place] [address]                Office [phone]  Fax [phone]

## 2022-09-09 NOTE — Progress Notes (Signed)
Patient's wife called RN to bedside to show RN that when wife wiped patient after he urinated there was some blood noted.  Patient states he has burning with urination and this also happened "Friday or Saturday".

## 2022-09-09 NOTE — Care Management Important Message (Signed)
Important Message  Patient Details  Name: [name] MRN: [id number] Date of Birth: [birth year]   Medicare Important Message Given:  Yes     Dorena Bodo 09/09/2022, 3:24 PM

## 2022-09-09 NOTE — Progress Notes (Signed)
Rounding Note    Patient Name: [name] Date of Encounter: 09/09/2022  [facility] Cardiologist: Thurmon Fair, MD   Subjective   No angina. Continues to have low-grade fever and chills. Underwent what appears to be uncomplicated amputation of the distal phalanx of the second left toe yesterday afternoon.  MRI did show evidence of osteomyelitis.  Inpatient Medications    Scheduled Meds:  aspirin EC  325 mg Oral QHS   atorvastatin  80 mg Oral QHS   enoxaparin (LOVENOX) injection  40 mg Subcutaneous Q24H   insulin aspart  0-9 Units Subcutaneous TID WC   metoprolol succinate  25 mg Oral Daily   pantoprazole  40 mg Oral Daily   pregabalin  150 mg Oral BID   tamsulosin  0.4 mg Oral QHS   Continuous Infusions:  sodium chloride Stopped (09/08/22 1534)   lactated ringers 125 mL/hr at 09/09/22 0538   piperacillin-tazobactam (ZOSYN)  IV 3.375 g (09/09/22 0543)   PRN Meds: acetaminophen **OR** acetaminophen, albuterol, nitroGLYCERIN, ondansetron **OR** ondansetron (ZOFRAN) IV, polyethylene glycol, sodium chloride   Vital Signs    Vitals:   09/08/22 2250 09/09/22 0326 09/09/22 0753 09/09/22 0754  BP: (!) 142/75 (!) 132/97 134/70   Pulse: 83 82 85   Resp: 19 20 (!) 26 19  Temp: (!) 101.4 F (38.6 C) 98.9 F (37.2 C) (!) 100.8 F (38.2 C)   TempSrc: Axillary Oral Oral   SpO2: 95% 98% (!) 89%   Weight:      Height:        Intake/Output Summary (Last 24 hours) at 09/09/2022 0834 Last data filed at 09/09/2022 0556 Gross per 24 hour  Intake 1500.49 ml  Output 905 ml  Net 595.49 ml      09/05/2022    4:07 PM 09/05/2022   10:55 AM 03/03/2022    2:00 PM  Last 3 Weights  Weight (lbs) 248 lb 0.3 oz 248 lb 257 lb  Weight (kg) 112.5 kg 112.492 kg 116.574 kg      Telemetry    Normal sinus rhythm- Personally Reviewed  ECG    No new tracing- Personally Reviewed  Physical Exam  Obese.  Looks comfortable lying fully supine in bed. GEN: No acute  distress.   Neck: No JVD Cardiac: RRR, no murmurs, rubs, or gallops.  Respiratory: Clear to auscultation bilaterally. GI: Soft, nontender, non-distended  MS: No edema; s/p amputation left second toe, dressing on the small ulceration plantar surface of right instep Neuro:  Nonfocal  Psych: Normal affect   Labs    High Sensitivity Troponin:   Recent Labs  Lab 09/07/22 1423 09/07/22 1611  TROPONINIHS 1,200* 1,241*     Chemistry Recent Labs  Lab 09/06/22 0013 09/07/22 0019 09/07/22 1424 09/08/22 0020 09/09/22 0010  NA 137 134* 133* 134* 133*  K 3.2* 3.5 3.7 3.5 3.5  CL 100 105 104 104 103  CO2 22 21* 20* 21* 23  GLUCOSE 113* 136* 117* 133* 153*  BUN 14 17 17 17 14   CREATININE 1.22 1.08 1.27* 1.28* 1.13  CALCIUM 8.3* 8.0* 7.8* 7.8* 8.0*  PROT 5.9* 5.8*  --   --   --   ALBUMIN 2.9* 2.7*  --   --   --   [name] 26 38  --   --   --   ALT 21 33  --   --   --   ALKPHOS 58 90  --   --   --  BILITOT 1.0 1.7*  --   --   --   GFRNONAA >60 >60 >60 >60 >60  ANIONGAP 15 8 9 9 7     Lipids No results for input(s): "CHOL", "TRIG", "HDL", "LABVLDL", "LDLCALC", "CHOLHDL" in the last 168 hours.  Hematology Recent Labs  Lab 09/07/22 1424 09/08/22 0020 09/09/22 0010  WBC 5.6 5.1 3.2*  RBC 4.73 4.47 4.54  HGB 13.5 12.8* 13.3  HCT 40.9 39.8 39.7  MCV 86.5 89.0 87.4  MCH 28.5 28.6 29.3  MCHC 33.0 32.2 33.5  RDW 14.6 14.7 14.7  PLT 102* 99* 93*   Thyroid No results for input(s): "TSH", "FREET4" in the last 168 hours.  BNPNo results for input(s): "BNP", "PROBNP" in the last 168 hours.  DDimer No results for input(s): "DDIMER" in the last 168 hours.   Radiology    ECHOCARDIOGRAM COMPLETE  Result Date: 09/08/2022    ECHOCARDIOGRAM REPORT   Patient Name:   [name] Date of Exam: 09/08/2022 Medical Rec #:  [id number]      Height:       68.0 in Accession #:    [id number]     Weight:       248.0 lb Date of Birth:  [birth year]      BSA:          2.239 m Patient Age:    67 years        BP:           97/56 mmHg Patient Gender: M              HR:           69 bpm. Exam Location:  Inpatient Procedure: 2D Echo, Cardiac Doppler, Color Doppler and Intracardiac            Opacification Agent Indications:    Elevated Troponin  History:        Patient has no prior history of Echocardiogram examinations.                 CAD, Sepsis; Risk Factors:Hypertension, Dyslipidemia, Diabetes                 and Non-Smoker.  Sonographer:    Aron Baba Referring Phys: [id number] RAVI PAHWANI  Sonographer Comments: Patient is obese. IMPRESSIONS  1. Left ventricular ejection fraction, by estimation, is 55 to 60%. Left ventricular ejection fraction by PLAX is 57 %. The left ventricle has normal function. The left ventricle has no regional wall motion abnormalities. Left ventricular diastolic parameters were normal.  2. Right ventricular systolic function is normal. The right ventricular size is moderately enlarged. Tricuspid regurgitation signal is inadequate for assessing PA pressure.  3. Right atrial size was moderately dilated.  4. The mitral valve is grossly normal. Trivial mitral valve regurgitation.  5. The aortic valve is tricuspid. Aortic valve regurgitation is not visualized. No aortic stenosis is present.  6. The inferior vena cava is dilated in size with <50% respiratory variability, suggesting right atrial pressure of 15 mmHg. Comparison(s): No prior Echocardiogram. FINDINGS  Left Ventricle: Left ventricular ejection fraction, by estimation, is 55 to 60%. Left ventricular ejection fraction by PLAX is 57 %. The left ventricle has normal function. The left ventricle has no regional wall motion abnormalities. Definity contrast agent was given IV to delineate the left ventricular endocardial borders. The left ventricular internal cavity size was normal in size. There is no left ventricular hypertrophy. Left ventricular diastolic parameters were normal. Right Ventricle: The right  ventricular size is moderately  enlarged. No increase in right ventricular wall thickness. Right ventricular systolic function is normal. Tricuspid regurgitation signal is inadequate for assessing PA pressure. Left Atrium: Left atrial size was normal in size. Right Atrium: Right atrial size was moderately dilated. Pericardium: There is no evidence of pericardial effusion. Mitral Valve: The mitral valve is grossly normal. Trivial mitral valve regurgitation. Tricuspid Valve: The tricuspid valve is grossly normal. Tricuspid valve regurgitation is trivial. Aortic Valve: The aortic valve is tricuspid. Aortic valve regurgitation is not visualized. No aortic stenosis is present. Pulmonic Valve: The pulmonic valve was normal in structure. Pulmonic valve regurgitation is not visualized. Aorta: The aortic root and ascending aorta are structurally normal, with no evidence of dilitation. Venous: The inferior vena cava is dilated in size with less than 50% respiratory variability, suggesting right atrial pressure of 15 mmHg. IAS/Shunts: No atrial level shunt detected by color flow Doppler.  LEFT VENTRICLE PLAX 2D LV EF:         Left            Diastology                ventricular     LV e' medial:    8.24 cm/s                ejection        LV E/e' medial:  14.2                fraction by     LV e' lateral:   10.30 cm/s                PLAX is 57      LV E/e' lateral: 11.4                %. LVIDd:         5.00 cm LVIDs:         3.50 cm LV PW:         1.00 cm LV IVS:        0.90 cm LVOT diam:     2.20 cm LV SV:         86 LV SV Index:   38 LVOT Area:     3.80 cm  RIGHT VENTRICLE RV S prime:     11.40 cm/s TAPSE (M-mode): 1.9 cm LEFT ATRIUM             Index        RIGHT ATRIUM           Index LA diam:        3.80 cm 1.70 cm/m   RA Area:     30.45 cm LA Vol (A2C):   55.9 ml 24.96 ml/m  RA Volume:   121.40 ml 54.21 ml/m LA Vol (A4C):   55.7 ml 24.87 ml/m LA Biplane Vol: 61.0 ml 27.24 ml/m  AORTIC VALVE LVOT Vmax:   103.00 cm/s LVOT Vmean:  71.100 cm/s LVOT  VTI:    0.225 m  AORTA Ao Root diam: 3.50 cm Ao Asc diam:  3.20 cm MITRAL VALVE MV Area (PHT): 4.06 cm     SHUNTS MV Decel Time: 187 msec     Systemic VTI:  0.22 m MV E velocity: 117.00 cm/s  Systemic Diam: 2.20 cm MV A velocity: 80.30 cm/s MV E/A ratio:  1.46 Zoila Shutter MD Electronically signed by Zoila Shutter MD Signature Date/Time: 09/08/2022/10:38:47 AM    Final  MR FOOT LEFT W WO CONTRAST  Result Date: 09/07/2022 CLINICAL DATA:  Foot ulcer on second toe, concern for osteomyelitis EXAM: MRI OF THE LEFT FOREFOOT WITHOUT AND WITH CONTRAST TECHNIQUE: Multiplanar, multisequence MR imaging of the left forefoot was performed both before and after administration of intravenous contrast. CONTRAST:  10mL GADAVIST GADOBUTROL 1 MMOL/ML IV SOLN COMPARISON:  Radiographs dated Sep 05, 2018 FINDINGS: Bones/Joint/Cartilage Bone marrow edema of the distal phalanx of the second digit with enhancement on post contrast sequences concerning for osteomyelitis. Marrow signal within remaining osseous structures is within normal limits. No evidence of acute fracture or dislocation. Ligaments Lisfranc and collateral ligaments appear intact. Muscles and Tendons Increased intramuscular signal of the plantar muscles suggesting myopathy/myositis. Flexor and extensor tendons appear intact. Soft tissues Skin irregularity and inflammatory changes about the distal phalanx of the second digit suggesting open wound. Soft tissue edema about the dorsum of the foot without evidence of fluid collection or abscess. IMPRESSION: 1. Bone marrow edema of the distal phalanx of the second digit with enhancement on post contrast sequences concerning for osteomyelitis. 2. Skin irregularity and inflammatory changes about the distal phalanx of the second digit suggesting open wound. 3. Soft tissue edema about the dorsum of the foot without evidence of fluid collection or abscess. 4. Increased intramuscular signal of the plantar muscles suggesting  myopathy/myositis. Electronically Signed   By: Larose Hires D.O.   On: 09/07/2022 22:49   DG CHEST PORT 1 VIEW  Result Date: 09/07/2022 CLINICAL DATA:  Pneumonia. EXAM: PORTABLE CHEST 1 VIEW COMPARISON:  09/05/2022 FINDINGS: Stable cardiomediastinal contours. Unchanged hazy opacity in the left base which may reflect atelectasis or pneumonia. No signs of pleural effusion or interstitial edema. The visualized osseous structures appear unremarkable. IMPRESSION: Persistent hazy opacity in the left base compatible with atelectasis or pneumonia. Electronically Signed   By: Signa Kell M.D.   On: 09/07/2022 15:07    Cardiac Studies   Echocardiogram 09/08/2022   1. Left ventricular ejection fraction, by estimation, is 55 to 60%. Left ventricular ejection fraction by PLAX is 57 %. The left ventricle has normal function. The left ventricle has no regional wall motion abnormalities. Left ventricular diastolic parameters were normal.   2. Right ventricular systolic function is normal. The right ventricular size is moderately enlarged. Tricuspid regurgitation signal is inadequate for assessing PA pressure.   3. Right atrial size was moderately dilated.   4. The mitral valve is grossly normal. Trivial mitral valve regurgitation.   5. The aortic valve is tricuspid. Aortic valve regurgitation is not visualized. No aortic stenosis is present.   6. The inferior vena cava is dilated in size with <50% respiratory variability, suggesting right atrial pressure of 15 mmHg. Comparison(s): No prior Echocardiogram.   Patient Profile     67 y.o. male with history of coronary artery disease (1997  right coronary artery stent, occluded at cath 2008, scar without ischemia 2022),hyperlipidemia, hypertension, type 2 diabetes mellitus complicated by peripheral neuropathy and bilateral Charcot foot, morbid obesity, OSA, recurrent problems with cellulitis, left knee arthroplasty admitted with sepsis due to osteomyelitis of the  second left toe, complicated by angina and a small demand infarction.  Assessment & Plan    Demand myocardial infarction: Echo does not show any new areas of wall motion abnormality and overall left ventricular systolic function is preserved.  This is consistent with a small area of demand myocardial injury, rather than a true acute atherothrombotic event. Sepsis: Presumably due to left toe osteomyelitis for  which she has undergone amputation.  Unfortunately he has continued to have low-grade fever even following the surgery. DM: Complicated by neuropathy and bilateral Charcot arthropathy.  Fair control with hemoglobin A1c 7.4%. AKI: Mild, probably related to his episodes of hypotension.  Kidney function already improved. OSA: This is likely the reason for mild enlargement of right heart chambers.  Follow-up in sleep clinic.  May need adjustment of settings. Leukopenia/thrombocytopenia: Without significant anemia or macrocytosis.  Mild thrombocytopenia seen dating back even to 2019 but definitely present a year ago.  Consider hematology evaluation as an outpatient.       For questions or updates, please contact [facility] Please consult [email or web address] for contact info under        Signed, Thurmon Fair, MD  09/09/2022, 8:34 AM

## 2022-09-09 NOTE — Evaluation (Signed)
Physical Therapy Evaluation Patient Details Name: [name] MRN: [id number] DOB: 1955/07/03 Today's Date: 09/09/2022  History of Present Illness  67 y.o. male presents to [facility] hospital on 09/05/2022, referred from [facility] for sepsis with L 2nd toe wound. Pt with elevated troponin and chest pain on 5/26, found to have demand MI. Pt underwent L 2nd toe amputation on 5/27. PMH includes CAD, cellulitis, DMII, GERD, HLD, HTN, OSA.  Clinical Impression  Pt presents to PT with deficits in endurance, gait, balance, power, strength. Pt is mobilizing well, transferring and ambulating with UE support of RW. Pt reports weakness compared to baseline. PT encourages frequent mobilization in an effort to improve power and activity tolerance. PT anticipates no post-acute PT or DME needs.       Recommendations for follow up therapy are one component of a multi-disciplinary discharge planning process, led by the attending physician.  Recommendations may be updated based on patient status, additional functional criteria and insurance authorization.  Follow Up Recommendations       Assistance Recommended at Discharge PRN  Patient can return home with the following  A little help with bathing/dressing/bathroom;Assistance with cooking/housework;Help with stairs or ramp for entrance    Equipment Recommendations None recommended by PT  Recommendations for Other Services       Functional Status Assessment Patient has had a recent decline in their functional status and demonstrates the ability to make significant improvements in function in a reasonable and predictable amount of time.     Precautions / Restrictions Precautions Precautions: Fall Precaution Comments: bilateral charcot foot Required Braces or Orthoses: Other Brace Other Brace: post-op shoe LLE Restrictions Weight Bearing Restrictions: Yes LLE Weight Bearing: Weight bearing as tolerated Other Position/Activity Restrictions: in post-op shoe       Mobility  Bed Mobility                    Transfers Overall transfer level: Needs assistance Equipment used: Rolling walker (2 wheels) Transfers: Sit to/from Stand Sit to Stand: Supervision                Ambulation/Gait Ambulation/Gait assistance: Supervision Gait Distance (Feet): 200 Feet Assistive device: Rolling walker (2 wheels) Gait Pattern/deviations: Step-through pattern Gait velocity: functional Gait velocity interpretation: 1.31 - 2.62 ft/sec, indicative of limited community ambulator   General Gait Details: slowed step-through gait  Stairs            Wheelchair Mobility    Modified Rankin (Stroke Patients Only)       Balance Overall balance assessment: Needs assistance Sitting-balance support: No upper extremity supported, Feet supported Sitting balance-Leahy Scale: Good     Standing balance support: Single extremity supported, Reliant on assistive device for balance Standing balance-Leahy Scale: Poor                               Pertinent Vitals/Pain Pain Assessment Pain Assessment: No/denies pain    Home Living Family/patient expects to be discharged to:: Private residence Living Arrangements: Alone Available Help at Discharge: Family;Available PRN/intermittently;Personal care attendant Type of Home: House Home Access: Level entry       Home Layout: One level Home Equipment: Agricultural consultant (2 wheels);Rollator (4 wheels);Cane - single point;BSC/3in1;Shower seat;Grab bars - toilet;Grab bars - tub/shower;Wheelchair - manual;Electric scooter;Hospital bed      Prior Function Prior Level of Function : Independent/Modified Independent;Driving  Mobility Comments: PRN use of SPC at baseline       Hand Dominance        Extremity/Trunk Assessment   Upper Extremity Assessment Upper Extremity Assessment: Generalized weakness    Lower Extremity Assessment Lower Extremity Assessment:  Generalized weakness    Cervical / Trunk Assessment Cervical / Trunk Assessment: Normal  Communication   Communication: No difficulties  Cognition Arousal/Alertness: Awake/alert Behavior During Therapy: WFL for tasks assessed/performed Overall Cognitive Status: Within Functional Limits for tasks assessed                                          General Comments General comments (skin integrity, edema, etc.): VSS on RA    Exercises     Assessment/Plan    PT Assessment Patient needs continued PT services  PT Problem List Decreased activity tolerance;Decreased balance;Decreased mobility;Decreased strength       PT Treatment Interventions DME instruction;Gait training;Functional mobility training;Therapeutic activities;Therapeutic exercise;Balance training;Patient/family education    PT Goals (Current goals can be found in the Care Plan section)  Acute Rehab PT Goals Patient Stated Goal: to return to independence PT Goal Formulation: With patient Time For Goal Achievement: 09/23/22 Potential to Achieve Goals: Good    Frequency Min 2X/week     Co-evaluation               AM-PAC PT "6 Clicks" Mobility  Outcome Measure Help needed turning from your back to your side while in a flat bed without using bedrails?: A Little Help needed moving from lying on your back to sitting on the side of a flat bed without using bedrails?: A Little Help needed moving to and from a bed to a chair (including a wheelchair)?: A Little Help needed standing up from a chair using your arms (e.g., wheelchair or bedside chair)?: A Little Help needed to walk in hospital room?: A Little Help needed climbing 3-5 steps with a railing? : A Little 6 Click Score: 18    End of Session   Activity Tolerance: Patient tolerated treatment well Patient left: in chair;with call bell/phone within reach;with family/visitor present Nurse Communication: Mobility status PT Visit Diagnosis:  Other abnormalities of gait and mobility (R26.89)    Time: 1610-9604 PT Time Calculation (min) (ACUTE ONLY): 21 min   Charges:   PT Evaluation $PT Eval Low Complexity: 1 Low          Arlyss Gandy, PT, DPT Acute Rehabilitation Office [phone]   Arlyss Gandy 09/09/2022, 1:25 PM

## 2022-09-09 NOTE — Progress Notes (Signed)
PROGRESS NOTE    [name]  ZOX:[id number] DOB: [birth year] DOA: 09/05/2022 PCP: Emilio Aspen, MD   Brief Narrative:  [name] is a 67 y.o. male with medical history significant of  CAD s/p PCI, DM-2, HTN, HLD, peripheral neuropathy-who [facility] evaluation of fever or chills.  While in the ED, patient had transient hypotension which resolved with IV fluid bolus.  Patient was eventually admitted to hospitalist service with a diagnosis of sepsis presumed to be secondary to diabetic foot ulcer vs osteomyelitis.  Patient received antibiotics in the ED.  Assessment & Plan:   Principal Problem:   Sepsis due to skin infection (HCC) Active Problems:   CAD (coronary artery disease), native coronary artery   Essential hypertension   Hyperlipidemia   Polyneuropathy due to type 2 diabetes mellitus (HCC)   Type 2 diabetes mellitus with left diabetic foot infection (HCC)   Foot ulcer due to secondary DM (HCC)  Sepsis secondary to left second toe osteomyelitis and grade 2 diabetic foot infection/left foot cellulitis POA: Patient met sepsis criteria based on fever 102.3 and tachypnea.  MRI left foot concerning for distal phalanx second toe left foot osteomyelitis.  Underwent amputation of the left second toe by Dr. [name] 09/08/2022.  Continues to have persistent fever.  Cultures negative so far.  Continue Zosyn.  Check procalcitonin.   Acute respiratory failure with hypoxia community-acquired pneumonia: Initial chest x-ray negative at admission but since patient developed persistent fever with chills and became hypoxic, repeat chest x-ray was done on 09/07/2022 which showed left lower lobe pneumonia.  He is on room air now.  Continue Zosyn.  Hypokalemia: Resolved  History of CAD s/p remote PCI: Patient did complain of chest pain on the morning of 09/07/2022.  EKG was done which did not show any acute ST-T wave changes.  Troponins were checked later in  the day which were found to be around 1200 but stable.  Cardiology consulted, per them, this is likely demand ischemia in the setting of sepsis.  No heparin was recommended.  Echo ruled out wall motion abnormality.  Normal ejection fraction.  AKI: Patient developed AKI on 09/07/2022.  Started on fluids.  AKI resolved.  Type 2 diabetes mellitus: Hemoglobin A1c 7.5.  Hold oral hypoglycemics.  Continue SSI.  Blood sugar controlled.   History of essential hypertension: Blood pressure within normal range.  Imdur discontinued.  Toprol-XL continued but at lower dose of 25 mg p.o. daily.    Hyperlipidemia: Statin.  Peripheral neuropathy: Continue Lyrica.  DVT prophylaxis: enoxaparin (LOVENOX) injection 40 mg Start: 09/05/22 1745   Code Status: Full Code  Family Communication: None present at bedside.  Plan of care discussed with patient in length and he/she verbalized understanding and agreed with it.  Status is: Inpatient Remains inpatient appropriate because: Patient with persistent fever.   Estimated body mass index is 37.71 kg/m as calculated from the following:   Height as of this encounter: 5\' 8"  (1.727 m).   Weight as of this encounter: 112.5 kg.    Nutritional Assessment: Body mass index is 37.71 kg/m.Marland Kitchen Seen by dietician.  I agree with the assessment and plan as outlined below: Nutrition Status:        . Skin Assessment: I have examined the patient's skin and I agree with the wound assessment as performed by the wound care RN as outlined below:    Consultants:  None  Procedures:  None  Antimicrobials:  Anti-infectives (From admission,  onward)    Start     Dose/Rate Route Frequency Ordered Stop   09/07/22 1600  piperacillin-tazobactam (ZOSYN) IVPB 3.375 g  Status:  Discontinued        3.375 g 100 mL/hr over 30 Minutes Intravenous Every 8 hours 09/07/22 1511 09/07/22 1513   09/07/22 1600  piperacillin-tazobactam (ZOSYN) IVPB 3.375 g        3.375 g 12.5 mL/hr over  240 Minutes Intravenous Every 8 hours 09/07/22 1514     09/06/22 1500  vancomycin (VANCOREADY) IVPB 1750 mg/350 mL  Status:  Discontinued        1,750 mg 175 mL/hr over 120 Minutes Intravenous Every 24 hours 09/05/22 1250 09/05/22 1717   09/06/22 1200  sulfamethoxazole-trimethoprim (BACTRIM DS) 800-160 MG per tablet 2 tablet  Status:  Discontinued        2 tablet Oral Every 12 hours 09/06/22 0958 09/07/22 1511   09/05/22 1400  vancomycin (VANCOCIN) IVPB 1000 mg/200 mL premix       See Hyperspace for full Linked Orders Report.   1,000 mg 200 mL/hr over 60 Minutes Intravenous  Once 09/05/22 1247 09/05/22 1601   09/05/22 1300  ceFEPIme (MAXIPIME) 2 g in sodium chloride 0.9 % 100 mL IVPB  Status:  Discontinued        2 g 200 mL/hr over 30 Minutes Intravenous Every 8 hours 09/05/22 1247 09/05/22 1649   09/05/22 1300  metroNIDAZOLE (FLAGYL) IVPB 500 mg        500 mg 100 mL/hr over 60 Minutes Intravenous  Once 09/05/22 1247 09/05/22 1456   09/05/22 1300  vancomycin (VANCOCIN) IVPB 1000 mg/200 mL premix       See Hyperspace for full Linked Orders Report.   1,000 mg 200 mL/hr over 60 Minutes Intravenous  Once 09/05/22 1247 09/05/22 1443         Subjective: Patient seen and went.  He complains of generalized weakness.  Denies any chest pain, palpitation, shortness of breath or abdominal pain.  Did have fever overnight along with chills and sweating at that time.  Objective: Vitals:   09/08/22 2250 09/09/22 0326 09/09/22 0753 09/09/22 0754  BP: (!) 142/75 (!) 132/97 134/70   Pulse: 83 82 85   Resp: 19 20 (!) 26 19  Temp: (!) 101.4 F (38.6 C) 98.9 F (37.2 C) (!) 100.8 F (38.2 C)   TempSrc: Axillary Oral Oral   SpO2: 95% 98% (!) 89%   Weight:      Height:        Intake/Output Summary (Last 24 hours) at 09/09/2022 0801 Last data filed at 09/09/2022 0556 Gross per 24 hour  Intake 1500.49 ml  Output 905 ml  Net 595.49 ml    Filed Weights   09/05/22 1055 09/05/22 1607  Weight:  112.5 kg 112.5 kg    Examination:  General exam: Appears calm and comfortable  Respiratory system: Clear to auscultation. Respiratory effort normal. Cardiovascular system: S1 & S2 heard, RRR. No JVD, murmurs, rubs, gallops or clicks. No pedal edema. Gastrointestinal system: Abdomen is nondistended, soft and nontender. No organomegaly or masses felt. Normal bowel sounds heard. Central nervous system: Alert and oriented. No focal neurological deficits. Extremities: Symmetric 5 x 5 power. Skin: No rashes, lesions or ulcers.  Psychiatry: Judgement and insight appear normal. Mood & affect appropriate.   Data Reviewed: I have personally reviewed following labs and imaging studies  CBC: Recent Labs  Lab 09/06/22 0013 09/07/22 0019 09/07/22 1424 09/08/22 0020 09/09/22 0010  WBC  9.7 6.5 5.6 5.1 3.2*  NEUTROABS  --  5.8 5.1 4.6 2.6  HGB 14.3 14.3 13.5 12.8* 13.3  HCT 44.3 44.1 40.9 39.8 39.7  MCV 88.8 88.6 86.5 89.0 87.4  PLT 107* 104* 102* 99* 93*    Basic Metabolic Panel: Recent Labs  Lab 09/06/22 0013 09/07/22 0019 09/07/22 1424 09/08/22 0020 09/09/22 0010  NA 137 134* 133* 134* 133*  K 3.2* 3.5 3.7 3.5 3.5  CL 100 105 104 104 103  CO2 22 21* 20* 21* 23  GLUCOSE 113* 136* 117* 133* 153*  BUN 14 17 17 17 14   CREATININE 1.22 1.08 1.27* 1.28* 1.13  CALCIUM 8.3* 8.0* 7.8* 7.8* 8.0*    GFR: Estimated Creatinine Clearance: 78.2 mL/min (by C-G formula based on SCr of 1.13 mg/dL). Liver Function Tests: Recent Labs  Lab 09/06/22 0013 09/07/22 0019  [name] 26 38  ALT 21 33  ALKPHOS 58 90  BILITOT 1.0 1.7*  PROT 5.9* 5.8*  ALBUMIN 2.9* 2.7*    No results for input(s): "LIPASE", "AMYLASE" in the last 168 hours. No results for input(s): "AMMONIA" in the last 168 hours. Coagulation Profile: Recent Labs  Lab 09/05/22 1237  INR 1.1    Cardiac Enzymes: No results for input(s): "CKTOTAL", "CKMB", "CKMBINDEX", "TROPONINI" in the last 168 hours. BNP (last 3 results) No  results for input(s): "PROBNP" in the last 8760 hours. HbA1C: Recent Labs    09/06/22 1026  HGBA1C 7.5*   CBG: Recent Labs  Lab 09/08/22 1125 09/08/22 1436 09/08/22 1613 09/08/22 2114 09/09/22 0625  GLUCAP 111* 100* 149* 157* 138*    Lipid Profile: No results for input(s): "CHOL", "HDL", "LDLCALC", "TRIG", "CHOLHDL", "LDLDIRECT" in the last 72 hours. Thyroid Function Tests: No results for input(s): "TSH", "T4TOTAL", "FREET4", "T3FREE", "THYROIDAB" in the last 72 hours. Anemia Panel: No results for input(s): "VITAMINB12", "FOLATE", "FERRITIN", "TIBC", "IRON", "RETICCTPCT" in the last 72 hours. Sepsis Labs: Recent Labs  Lab 09/05/22 1237 09/05/22 1455 09/07/22 1424 09/08/22 0813  LATICACIDVEN 2.1* 2.1* 1.5 1.2     Recent Results (from the past 240 hour(s))  Blood Culture (routine x 2)     Status: None (Preliminary result)   Collection Time: 09/05/22 12:52 PM   Specimen: BLOOD LEFT FOREARM  Result Value Ref Range Status   Specimen Description   Final    BLOOD LEFT FOREARM Performed at [facility], [address] [place], [place] [address]    Special Requests   Final    BOTTLES DRAWN AEROBIC AND ANAEROBIC Blood Culture adequate volume Performed at [facility], [address] [place], [place] [address]    Culture   Final    NO GROWTH 3 DAYS Performed at [facility], [address], [place], [place] [address]    Report Status PENDING  Incomplete  Blood Culture (routine x 2)     Status: None (Preliminary result)   Collection Time: 09/05/22 12:57 PM   Specimen: BLOOD  Result Value Ref Range Status   Specimen Description   Final    BLOOD BLOOD RIGHT WRIST Performed at [facility], [address], [place], [place] [address]    Special Requests   Final    BOTTLES DRAWN AEROBIC AND ANAEROBIC Blood Culture adequate volume Performed at [facility], [address] [place], [place]  [address]    Culture   Final    NO GROWTH 3 DAYS Performed at [facility], [address] [place],  [place] [address]    Report Status PENDING  Incomplete  SARS Coronavirus 2 by RT PCR (hospital order, performed in [facility] hospital lab) *cepheid single result test* Anterior Nasal Swab     Status: None   Collection Time: 09/05/22  2:19 PM   Specimen: Anterior Nasal Swab  Result Value Ref Range Status   SARS Coronavirus 2 by RT PCR NEGATIVE NEGATIVE Final    Comment: (NOTE) SARS-CoV-2 target nucleic acids are NOT DETECTED.  The SARS-CoV-2 RNA is generally detectable in upper and lower respiratory specimens during the acute phase of infection. The lowest concentration of SARS-CoV-2 viral copies this assay can detect is 250 copies / mL. A negative result does not preclude SARS-CoV-2 infection and should not be used as the sole basis for treatment or other patient management decisions.  A negative result may occur with improper specimen collection / handling, submission of specimen other than nasopharyngeal swab, presence of viral mutation(s) within the areas targeted by this assay, and inadequate number of viral copies (<250 copies / mL). A negative result must be combined with clinical observations, patient history, and epidemiological information.  Fact Sheet for Patients:   RoadLapTop.co.za  Fact Sheet for Healthcare Providers: http://kim-miller.com/  This test is not yet approved or  cleared by the [place] FDA and has been authorized for detection and/or diagnosis of SARS-CoV-2 by FDA under an Emergency Use Authorization (EUA).  This EUA will remain in effect (meaning this test can be used) for the duration of the COVID-19 declaration under Section 564(b)(1) of the Act, 21 U.S.C. section 360bbb-3(b)(1), unless the authorization is terminated or revoked sooner.  Performed at [facility], [address], [place], [place] [address]   Respiratory (~20 pathogens) panel by PCR     Status: None   Collection Time: 09/07/22  3:21 PM   Specimen: Nasopharyngeal Swab; Respiratory  Result Value Ref Range Status   Adenovirus NOT DETECTED NOT DETECTED Final   Coronavirus 229E NOT DETECTED NOT DETECTED Final    Comment: (NOTE) The Coronavirus on the Respiratory Panel, DOES NOT test for the novel  Coronavirus (2019 nCoV)    Coronavirus HKU1 NOT DETECTED NOT DETECTED Final   Coronavirus NL63 NOT DETECTED NOT DETECTED Final   Coronavirus OC43 NOT DETECTED NOT DETECTED Final   Metapneumovirus NOT DETECTED NOT DETECTED Final   Rhinovirus / Enterovirus NOT DETECTED NOT DETECTED Final   Influenza A NOT DETECTED NOT DETECTED Final   Influenza B NOT DETECTED NOT DETECTED Final   Parainfluenza Virus 1 NOT DETECTED NOT DETECTED Final   Parainfluenza Virus 2 NOT DETECTED NOT DETECTED Final   Parainfluenza Virus 3 NOT DETECTED NOT DETECTED Final   Parainfluenza Virus 4 NOT DETECTED NOT DETECTED Final   Respiratory Syncytial Virus NOT DETECTED NOT DETECTED Final   Bordetella pertussis NOT DETECTED NOT DETECTED Final   Bordetella Parapertussis NOT DETECTED NOT DETECTED Final   Chlamydophila pneumoniae NOT DETECTED NOT DETECTED Final   Mycoplasma pneumoniae NOT DETECTED NOT DETECTED Final    Comment: Performed at [facility], [address] [place], [place] [address]  Aerobic/Anaerobic Culture w Gram Stain (surgical/deep wound)     Status: None (Preliminary result)   Collection Time: 09/08/22  2:06 PM   Specimen: PATH Digit amputation; Tissue  Result Value Ref Range Status   Specimen Description TISSUE  Final   Special Requests DIGIT  Final   Gram Stain NO WBC SEEN NO ORGANISMS SEEN   Final   Culture  Final    CULTURE REINCUBATED FOR BETTER GROWTH Performed at [facility], [address] [place], [place] [address]    Report Status PENDING  Incomplete     Radiology  Studies: ECHOCARDIOGRAM COMPLETE  Result Date: 09/08/2022    ECHOCARDIOGRAM REPORT   Patient Name:   [name] Date of Exam: 09/08/2022 Medical Rec #:  [id number]      Height:       68.0 in Accession #:    [id number]     Weight:       248.0 lb Date of Birth:  21-Oct-1955      BSA:          2.239 m Patient Age:    67 years       BP:           97/56 mmHg Patient Gender: M              HR:           69 bpm. Exam Location:  Inpatient Procedure: 2D Echo, Cardiac Doppler, Color Doppler and Intracardiac            Opacification Agent Indications:    Elevated Troponin  History:        Patient has no prior history of Echocardiogram examinations.                 CAD, Sepsis; Risk Factors:Hypertension, Dyslipidemia, Diabetes                 and Non-Smoker.  Sonographer:    Aron Baba Referring Phys: [id number] Eular Panek  Sonographer Comments: Patient is obese. IMPRESSIONS  1. Left ventricular ejection fraction, by estimation, is 55 to 60%. Left ventricular ejection fraction by PLAX is 57 %. The left ventricle has normal function. The left ventricle has no regional wall motion abnormalities. Left ventricular diastolic parameters were normal.  2. Right ventricular systolic function is normal. The right ventricular size is moderately enlarged. Tricuspid regurgitation signal is inadequate for assessing PA pressure.  3. Right atrial size was moderately dilated.  4. The mitral valve is grossly normal. Trivial mitral valve regurgitation.  5. The aortic valve is tricuspid. Aortic valve regurgitation is not visualized. No aortic stenosis is present.  6. The inferior vena cava is dilated in size with <50% respiratory variability, suggesting right atrial pressure of 15 mmHg. Comparison(s): No prior Echocardiogram. FINDINGS  Left Ventricle: Left ventricular ejection fraction, by estimation, is 55 to 60%. Left ventricular ejection fraction by PLAX is 57 %. The left ventricle has normal function. The left ventricle has no regional  wall motion abnormalities. Definity contrast agent was given IV to delineate the left ventricular endocardial borders. The left ventricular internal cavity size was normal in size. There is no left ventricular hypertrophy. Left ventricular diastolic parameters were normal. Right Ventricle: The right ventricular size is moderately enlarged. No increase in right ventricular wall thickness. Right ventricular systolic function is normal. Tricuspid regurgitation signal is inadequate for assessing PA pressure. Left Atrium: Left atrial size was normal in size. Right Atrium: Right atrial size was moderately dilated. Pericardium: There is no evidence of pericardial effusion. Mitral Valve: The mitral valve is grossly normal. Trivial mitral valve regurgitation. Tricuspid Valve: The tricuspid valve is grossly normal. Tricuspid valve regurgitation is trivial. Aortic Valve: The aortic valve is tricuspid. Aortic valve regurgitation is not visualized. No aortic stenosis is present. Pulmonic Valve: The pulmonic valve was normal in structure. Pulmonic valve regurgitation is not  visualized. Aorta: The aortic root and ascending aorta are structurally normal, with no evidence of dilitation. Venous: The inferior vena cava is dilated in size with less than 50% respiratory variability, suggesting right atrial pressure of 15 mmHg. IAS/Shunts: No atrial level shunt detected by color flow Doppler.  LEFT VENTRICLE PLAX 2D LV EF:         Left            Diastology                ventricular     LV e' medial:    8.24 cm/s                ejection        LV E/e' medial:  14.2                fraction by     LV e' lateral:   10.30 cm/s                PLAX is 57      LV E/e' lateral: 11.4                %. LVIDd:         5.00 cm LVIDs:         3.50 cm LV PW:         1.00 cm LV IVS:        0.90 cm LVOT diam:     2.20 cm LV SV:         86 LV SV Index:   38 LVOT Area:     3.80 cm  RIGHT VENTRICLE RV S prime:     11.40 cm/s TAPSE (M-mode): 1.9 cm LEFT  ATRIUM             Index        RIGHT ATRIUM           Index LA diam:        3.80 cm 1.70 cm/m   RA Area:     30.45 cm LA Vol (A2C):   55.9 ml 24.96 ml/m  RA Volume:   121.40 ml 54.21 ml/m LA Vol (A4C):   55.7 ml 24.87 ml/m LA Biplane Vol: 61.0 ml 27.24 ml/m  AORTIC VALVE LVOT Vmax:   103.00 cm/s LVOT Vmean:  71.100 cm/s LVOT VTI:    0.225 m  AORTA Ao Root diam: 3.50 cm Ao Asc diam:  3.20 cm MITRAL VALVE MV Area (PHT): 4.06 cm     SHUNTS MV Decel Time: 187 msec     Systemic VTI:  0.22 m MV E velocity: 117.00 cm/s  Systemic Diam: 2.20 cm MV A velocity: 80.30 cm/s MV E/A ratio:  1.46 Zoila Shutter MD Electronically signed by Zoila Shutter MD Signature Date/Time: 09/08/2022/10:38:47 AM    Final    MR FOOT LEFT W WO CONTRAST  Result Date: 09/07/2022 CLINICAL DATA:  Foot ulcer on second toe, concern for osteomyelitis EXAM: MRI OF THE LEFT FOREFOOT WITHOUT AND WITH CONTRAST TECHNIQUE: Multiplanar, multisequence MR imaging of the left forefoot was performed both before and after administration of intravenous contrast. CONTRAST:  10mL GADAVIST GADOBUTROL 1 MMOL/ML IV SOLN COMPARISON:  Radiographs dated Sep 05, 2018 FINDINGS: Bones/Joint/Cartilage Bone marrow edema of the distal phalanx of the second digit with enhancement on post contrast sequences concerning for osteomyelitis. Marrow signal within remaining osseous structures is within normal limits. No evidence of acute fracture or dislocation. Ligaments Lisfranc and collateral ligaments  appear intact. Muscles and Tendons Increased intramuscular signal of the plantar muscles suggesting myopathy/myositis. Flexor and extensor tendons appear intact. Soft tissues Skin irregularity and inflammatory changes about the distal phalanx of the second digit suggesting open wound. Soft tissue edema about the dorsum of the foot without evidence of fluid collection or abscess. IMPRESSION: 1. Bone marrow edema of the distal phalanx of the second digit with enhancement on post  contrast sequences concerning for osteomyelitis. 2. Skin irregularity and inflammatory changes about the distal phalanx of the second digit suggesting open wound. 3. Soft tissue edema about the dorsum of the foot without evidence of fluid collection or abscess. 4. Increased intramuscular signal of the plantar muscles suggesting myopathy/myositis. Electronically Signed   By: Larose Hires D.O.   On: 09/07/2022 22:49   DG CHEST PORT 1 VIEW  Result Date: 09/07/2022 CLINICAL DATA:  Pneumonia. EXAM: PORTABLE CHEST 1 VIEW COMPARISON:  09/05/2022 FINDINGS: Stable cardiomediastinal contours. Unchanged hazy opacity in the left base which may reflect atelectasis or pneumonia. No signs of pleural effusion or interstitial edema. The visualized osseous structures appear unremarkable. IMPRESSION: Persistent hazy opacity in the left base compatible with atelectasis or pneumonia. Electronically Signed   By: Signa Kell M.D.   On: 09/07/2022 15:07    Scheduled Meds:  aspirin EC  325 mg Oral QHS   atorvastatin  80 mg Oral QHS   enoxaparin (LOVENOX) injection  40 mg Subcutaneous Q24H   insulin aspart  0-9 Units Subcutaneous TID WC   metoprolol succinate  25 mg Oral Daily   pantoprazole  40 mg Oral Daily   pregabalin  150 mg Oral BID   tamsulosin  0.4 mg Oral QHS   Continuous Infusions:  sodium chloride Stopped (09/08/22 1534)   lactated ringers 125 mL/hr at 09/09/22 0538   piperacillin-tazobactam (ZOSYN)  IV 3.375 g (09/09/22 0543)     LOS: 4 days   Hughie Closs, MD [facility]  09/09/2022, 8:01 AM   *Please note that this is a verbal dictation therefore any spelling or grammatical errors are due to the "Dragon Medical One" system interpretation.  Please page via Amion and do not message via secure chat for urgent patient care matters. Secure chat can be used for non urgent patient care matters.  How to contact the [facility] Attending or Consulting provider 7A - 7P or covering provider during after  hours 7P -7A, for this patient?  Check the care team in [facility] and look for a) attending/consulting [facility] provider listed and b) the [facility] team listed. Page or secure chat 7A-7P. Log into [email or web address] and use [place]'s universal password to access. If you do not have the password, please contact the hospital operator. Locate the [facility] provider you are looking for under [facility] and page to a number that you can be directly reached. If you still have difficulty reaching the provider, please page the [facility] (Director on Call) for the Hospitalists listed on amion for assistance.

## 2022-09-10 DIAGNOSIS — A419 Sepsis, unspecified organism: Secondary | ICD-10-CM | POA: Diagnosis not present

## 2022-09-10 DIAGNOSIS — L089 Local infection of the skin and subcutaneous tissue, unspecified: Secondary | ICD-10-CM | POA: Diagnosis not present

## 2022-09-10 LAB — CBC WITH DIFFERENTIAL/PLATELET
Abs Immature Granulocytes: 0.02 10*3/uL (ref 0.00–0.07)
Basophils Absolute: 0 10*3/uL (ref 0.0–0.1)
Basophils Relative: 0 %
Eosinophils Absolute: 0.2 10*3/uL (ref 0.0–0.5)
Eosinophils Relative: 4 %
HCT: 40.6 % (ref 39.0–52.0)
Hemoglobin: 13.8 g/dL (ref 13.0–17.0)
Immature Granulocytes: 0 %
Lymphocytes Relative: 8 %
Lymphs Abs: 0.4 10*3/uL — ABNORMAL LOW (ref 0.7–4.0)
MCH: 29.4 pg (ref 26.0–34.0)
MCHC: 34 g/dL (ref 30.0–36.0)
MCV: 86.4 fL (ref 80.0–100.0)
Monocytes Absolute: 0.3 10*3/uL (ref 0.1–1.0)
Monocytes Relative: 7 %
Neutro Abs: 3.7 10*3/uL (ref 1.7–7.7)
Neutrophils Relative %: 81 %
Platelets: 103 10*3/uL — ABNORMAL LOW (ref 150–400)
RBC: 4.7 MIL/uL (ref 4.22–5.81)
RDW: 14.8 % (ref 11.5–15.5)
WBC: 4.6 10*3/uL (ref 4.0–10.5)
nRBC: 0 % (ref 0.0–0.2)

## 2022-09-10 LAB — URINALYSIS, ROUTINE W REFLEX MICROSCOPIC
Bacteria, UA: NONE SEEN
Glucose, UA: >=500 mg/dL — AB
Ketones, ur: NEGATIVE mg/dL
Leukocytes,Ua: NEGATIVE
Nitrite: NEGATIVE
Protein, ur: 100 mg/dL — AB
Specific Gravity, Urine: 1.016 (ref 1.005–1.030)
pH: 6 (ref 5.0–8.0)

## 2022-09-10 LAB — GLUCOSE, CAPILLARY
Glucose-Capillary: 136 mg/dL — ABNORMAL HIGH (ref 70–99)
Glucose-Capillary: 171 mg/dL — ABNORMAL HIGH (ref 70–99)
Glucose-Capillary: 185 mg/dL — ABNORMAL HIGH (ref 70–99)
Glucose-Capillary: 178 mg/dL — ABNORMAL HIGH (ref 70–99)

## 2022-09-10 LAB — BASIC METABOLIC PANEL
Anion gap: 10 (ref 5–15)
BUN: 10 mg/dL (ref 8–23)
CO2: 23 mmol/L (ref 22–32)
Calcium: 8.2 mg/dL — ABNORMAL LOW (ref 8.9–10.3)
Chloride: 102 mmol/L (ref 98–111)
Creatinine, Ser: 0.99 mg/dL (ref 0.61–1.24)
GFR, Estimated: >60 mL/min (ref >60)
Glucose, Bld: 161 mg/dL — ABNORMAL HIGH (ref 70–99)
Potassium: 3.5 mmol/L (ref 3.5–5.1)
Sodium: 135 mmol/L (ref 135–145)

## 2022-09-10 LAB — SURGICAL PATHOLOGY

## 2022-09-10 LAB — CULTURE, BLOOD (ROUTINE X 2)

## 2022-09-10 NOTE — Progress Notes (Signed)
Mobility Specialist Progress Note:    09/10/22 1400  Mobility  Activity Ambulated with assistance in hallway  Level of Assistance Standby assist, set-up cues, supervision of patient - no hands on  Assistive Device Front wheel walker  Distance Ambulated (ft) 150 ft  Activity Response Tolerated well  Mobility Referral Yes  $Mobility charge 1 Mobility  Mobility Specialist Start Time (ACUTE ONLY) 1410  Mobility Specialist Stop Time (ACUTE ONLY) 1422  Mobility Specialist Time Calculation (min) (ACUTE ONLY) 12 min   Pt received exiting BR, agreeable to ambulate. No c/o throughout session, assisted back to chair with call bell at hand. Family member and RN in room.  Thompson Grayer Mobility Specialist  Please contact vis Secure Chat or  Rehab Office [phone]

## 2022-09-10 NOTE — Progress Notes (Signed)
PROGRESS NOTE    [name]  ZOX:[id number] DOB: 10-01-1955 DOA: 09/05/2022 PCP: Emilio Aspen, MD   Brief Narrative:  [name] is a 67 y.o. male with medical history significant of  CAD s/p PCI, DM-2, HTN, HLD, peripheral neuropathy-who [facility] for evaluation of fever or chills.  While in the ED, patient had transient hypotension which resolved with IV fluid bolus.  Patient was eventually admitted to hospitalist service with a diagnosis of sepsis presumed to be secondary to diabetic foot ulcer vs osteomyelitis.  Patient received antibiotics in the ED. details below.  Assessment & Plan:   Principal Problem:   Sepsis due to skin infection (HCC) Active Problems:   CAD (coronary artery disease), native coronary artery   Essential hypertension   Hyperlipidemia   Polyneuropathy due to type 2 diabetes mellitus (HCC)   Type 2 diabetes mellitus with left diabetic foot infection (HCC)   Foot ulcer due to secondary DM (HCC)  Sepsis secondary to left second toe osteomyelitis and grade 2 diabetic foot infection/left foot cellulitis POA: Patient met sepsis criteria based on fever 102.3 and tachypnea.  MRI left foot concerning for distal phalanx second toe left foot osteomyelitis.  Underwent amputation of the left second toe by Dr. [name] 09/08/2022.  Patient continued to have fever even after the surgery, last temperature spike was 100.9 at around 8 AM on 09/09/2022.  Procalcitonin also elevated around 3.  Clinically he has improved however due to the fact that he continued to have fever even after surgery, concerns me about another source.  Discussed in length with patient, it would be safer to observe him another night on current antibiotics.  Although he is very eager to go home but he is in agreement and prefers to discharge home early tomorrow morning if he remains afebrile.  Acute respiratory failure with hypoxia community-acquired pneumonia: Initial  chest x-ray negative at admission but since patient developed persistent fever with chills and became hypoxic, repeat chest x-ray was done on 09/07/2022 which showed left lower lobe pneumonia.  He is hypoxic intermittently.  I have encouraged him incentive submitted.  Should wean oxygen and should be able to go home without oxygen.  Continue Zosyn.  Hypokalemia: Resolved  History of CAD s/p remote PCI: Patient did complain of chest pain on the morning of 09/07/2022.  EKG was done which did not show any acute ST-T wave changes.  Troponins were checked later in the day which were found to be around 1200 but stable.  Cardiology consulted, per them, this is likely demand ischemia in the setting of sepsis.  No heparin was recommended.  Echo ruled out wall motion abnormality.  Normal ejection fraction.  Cardiology signed off.  AKI: Patient developed AKI on 09/07/2022.  Started on fluids.  AKI resolved.  Type 2 diabetes mellitus: Hemoglobin A1c 7.5.  Hold oral hypoglycemics.  Continue SSI.  Blood sugar controlled.   History of essential hypertension: Blood pressure within normal range.  Imdur discontinued.  Toprol-XL continued but at lower dose of 25 mg p.o. daily.  Resume or adjust medications accordingly.  Hyperlipidemia: Statin.  Peripheral neuropathy: Continue Lyrica.  DVT prophylaxis: enoxaparin (LOVENOX) injection 40 mg Start: 09/05/22 1745   Code Status: Full Code  Family Communication: None present at bedside.  Plan of care discussed with patient in length and he/she verbalized understanding and agreed with it.  Status is: Inpatient Remains inpatient appropriate because: Will observe another night.  Plan for discharge tomorrow  morning if remains afebrile.   Estimated body mass index is 37.71 kg/m as calculated from the following:   Height as of this encounter: 5\' 8"  (1.727 m).   Weight as of this encounter: 112.5 kg.    Nutritional Assessment: Body mass index is 37.71 kg/m.Marland Kitchen Seen by  dietician.  I agree with the assessment and plan as outlined below: Nutrition Status:        . Skin Assessment: I have examined the patient's skin and I agree with the wound assessment as performed by the wound care RN as outlined below:    Consultants:  Podiatry and cardiology  Procedures:  As above  Antimicrobials:  Anti-infectives (From admission, onward)    Start     Dose/Rate Route Frequency Ordered Stop   09/07/22 1600  piperacillin-tazobactam (ZOSYN) IVPB 3.375 g  Status:  Discontinued        3.375 g 100 mL/hr over 30 Minutes Intravenous Every 8 hours 09/07/22 1511 09/07/22 1513   09/07/22 1600  piperacillin-tazobactam (ZOSYN) IVPB 3.375 g        3.375 g 12.5 mL/hr over 240 Minutes Intravenous Every 8 hours 09/07/22 1514     09/06/22 1500  vancomycin (VANCOREADY) IVPB 1750 mg/350 mL  Status:  Discontinued        1,750 mg 175 mL/hr over 120 Minutes Intravenous Every 24 hours 09/05/22 1250 09/05/22 1717   09/06/22 1200  sulfamethoxazole-trimethoprim (BACTRIM DS) 800-160 MG per tablet 2 tablet  Status:  Discontinued        2 tablet Oral Every 12 hours 09/06/22 0958 09/07/22 1511   09/05/22 1400  vancomycin (VANCOCIN) IVPB 1000 mg/200 mL premix       See Hyperspace for full Linked Orders Report.   1,000 mg 200 mL/hr over 60 Minutes Intravenous  Once 09/05/22 1247 09/05/22 1601   09/05/22 1300  ceFEPIme (MAXIPIME) 2 g in sodium chloride 0.9 % 100 mL IVPB  Status:  Discontinued        2 g 200 mL/hr over 30 Minutes Intravenous Every 8 hours 09/05/22 1247 09/05/22 1649   09/05/22 1300  metroNIDAZOLE (FLAGYL) IVPB 500 mg        500 mg 100 mL/hr over 60 Minutes Intravenous  Once 09/05/22 1247 09/05/22 1456   09/05/22 1300  vancomycin (VANCOCIN) IVPB 1000 mg/200 mL premix       See Hyperspace for full Linked Orders Report.   1,000 mg 200 mL/hr over 60 Minutes Intravenous  Once 09/05/22 1247 09/05/22 1443         Subjective: Patient seen and examined.  He feels better.   His voice is also stronger.  No complaints today.  Objective: Vitals:   09/09/22 2249 09/09/22 2315 09/10/22 0322 09/10/22 0809  BP:  (!) 159/73 (!) 140/78 138/74  Pulse: 78 79 69 71  Resp: (!) 25 18 20 20   Temp:  99.9 F (37.7 C) 99.2 F (37.3 C) 99 F (37.2 C)  TempSrc:  Oral Oral Oral  SpO2: 90% 97% 96% 92%  Weight:      Height:        Intake/Output Summary (Last 24 hours) at 09/10/2022 0911 Last data filed at 09/10/2022 0500 Gross per 24 hour  Intake 240 ml  Output 775 ml  Net -535 ml    Filed Weights   09/05/22 1055 09/05/22 1607  Weight: 112.5 kg 112.5 kg    Examination:  General exam: Appears calm and comfortable  Respiratory system: Clear to auscultation. Respiratory effort normal. Cardiovascular  system: S1 & S2 heard, RRR. No JVD, murmurs, rubs, gallops or clicks. No pedal edema. Gastrointestinal system: Abdomen is nondistended, soft and nontender. No organomegaly or masses felt. Normal bowel sounds heard. Central nervous system: Alert and oriented. No focal neurological deficits. Extremities: Has cam boot in the left foot. Psychiatry: Judgement and insight appear normal. Mood & affect appropriate.   Data Reviewed: I have personally reviewed following labs and imaging studies  CBC: Recent Labs  Lab 09/07/22 0019 09/07/22 1424 09/08/22 0020 09/09/22 0010 09/10/22 0745  WBC 6.5 5.6 5.1 3.2* 4.6  NEUTROABS 5.8 5.1 4.6 2.6 3.7  HGB 14.3 13.5 12.8* 13.3 13.8  HCT 44.1 40.9 39.8 39.7 40.6  MCV 88.6 86.5 89.0 87.4 86.4  PLT 104* 102* 99* 93* 103*    Basic Metabolic Panel: Recent Labs  Lab 09/07/22 0019 09/07/22 1424 09/08/22 0020 09/09/22 0010 09/10/22 0745  NA 134* 133* 134* 133* 135  K 3.5 3.7 3.5 3.5 3.5  CL 105 104 104 103 102  CO2 21* 20* 21* 23 23  GLUCOSE 136* 117* 133* 153* 161*  BUN 17 17 17 14 10   CREATININE 1.08 1.27* 1.28* 1.13 0.99  CALCIUM 8.0* 7.8* 7.8* 8.0* 8.2*    GFR: Estimated Creatinine Clearance: 89.3 mL/min (by C-G  formula based on SCr of 0.99 mg/dL). Liver Function Tests: Recent Labs  Lab 09/06/22 0013 09/07/22 0019  [name] 26 38  ALT 21 33  ALKPHOS 58 90  BILITOT 1.0 1.7*  PROT 5.9* 5.8*  ALBUMIN 2.9* 2.7*    No results for input(s): "LIPASE", "AMYLASE" in the last 168 hours. No results for input(s): "AMMONIA" in the last 168 hours. Coagulation Profile: Recent Labs  Lab 09/05/22 1237  INR 1.1    Cardiac Enzymes: No results for input(s): "CKTOTAL", "CKMB", "CKMBINDEX", "TROPONINI" in the last 168 hours. BNP (last 3 results) No results for input(s): "PROBNP" in the last 8760 hours. HbA1C: No results for input(s): "HGBA1C" in the last 72 hours.  CBG: Recent Labs  Lab 09/09/22 0625 09/09/22 1134 09/09/22 1645 09/09/22 2134 09/10/22 0608  GLUCAP 138* 199* 144* 172* 136*    Lipid Profile: No results for input(s): "CHOL", "HDL", "LDLCALC", "TRIG", "CHOLHDL", "LDLDIRECT" in the last 72 hours. Thyroid Function Tests: No results for input(s): "TSH", "T4TOTAL", "FREET4", "T3FREE", "THYROIDAB" in the last 72 hours. Anemia Panel: No results for input(s): "VITAMINB12", "FOLATE", "FERRITIN", "TIBC", "IRON", "RETICCTPCT" in the last 72 hours. Sepsis Labs: Recent Labs  Lab 09/05/22 1237 09/05/22 1455 09/07/22 1424 09/08/22 0813 09/09/22 0010  PROCALCITON  --   --   --   --  3.02  LATICACIDVEN 2.1* 2.1* 1.5 1.2  --      Recent Results (from the past 240 hour(s))  Blood Culture (routine x 2)     Status: None   Collection Time: 09/05/22 12:52 PM   Specimen: BLOOD LEFT FOREARM  Result Value Ref Range Status   Specimen Description   Final    BLOOD LEFT FOREARM Performed at [facility], [address] [place], [place] [address]    Special Requests   Final    BOTTLES DRAWN AEROBIC AND ANAEROBIC Blood Culture adequate volume Performed at [facility], [address] [place], [place] [address]    Culture   Final    NO GROWTH 5 DAYS Performed at [facility], [address] [place], [place] [address]    Report Status 09/10/2022 FINAL  Final  Blood Culture (routine x 2)  Status: None   Collection Time: 09/05/22 12:57 PM   Specimen: BLOOD  Result Value Ref Range Status   Specimen Description   Final    BLOOD BLOOD RIGHT WRIST Performed at [facility], [address], [place], [place] [address]    Special Requests   Final    BOTTLES DRAWN AEROBIC AND ANAEROBIC Blood Culture adequate volume Performed at [facility], [address] [place], [place] [address]    Culture   Final    NO GROWTH 5 DAYS Performed at [facility], [address] [place], [place] [address]    Report Status 09/10/2022 FINAL  Final  SARS Coronavirus 2 by RT PCR (hospital order, performed in [facility] hospital lab) *cepheid single result test* Anterior Nasal Swab     Status: None   Collection Time: 09/05/22  2:19 PM   Specimen: Anterior Nasal Swab  Result Value Ref Range Status   SARS Coronavirus 2 by RT PCR NEGATIVE NEGATIVE Final    Comment: (NOTE) SARS-CoV-2 target nucleic acids are NOT DETECTED.  The SARS-CoV-2 RNA is generally detectable in upper and lower respiratory specimens during the acute phase of infection. The lowest concentration of SARS-CoV-2 viral copies this assay can detect is 250 copies / mL. A negative result does not preclude SARS-CoV-2 infection and should not be used as the sole basis for treatment or other patient management decisions.  A negative result may occur with improper specimen collection / handling, submission of specimen other than nasopharyngeal swab, presence of viral mutation(s) within the areas targeted by this assay, and inadequate number of viral copies (<250 copies / mL). A negative result must be combined with clinical observations, patient history, and epidemiological information.  Fact Sheet for Patients:    RoadLapTop.co.za  Fact Sheet for Healthcare Providers: http://kim-miller.com/  This test is not yet approved or  cleared by the [place] FDA and has been authorized for detection and/or diagnosis of SARS-CoV-2 by FDA under an Emergency Use Authorization (EUA).  This EUA will remain in effect (meaning this test can be used) for the duration of the COVID-19 declaration under Section 564(b)(1) of the Act, 21 U.S.C. section 360bbb-3(b)(1), unless the authorization is terminated or revoked sooner.  Performed at [facility], [address], [place], [place] [address]   Respiratory (~20 pathogens) panel by PCR     Status: None   Collection Time: 09/07/22  3:21 PM   Specimen: Nasopharyngeal Swab; Respiratory  Result Value Ref Range Status   Adenovirus NOT DETECTED NOT DETECTED Final   Coronavirus 229E NOT DETECTED NOT DETECTED Final    Comment: (NOTE) The Coronavirus on the Respiratory Panel, DOES NOT test for the novel  Coronavirus (2019 nCoV)    Coronavirus HKU1 NOT DETECTED NOT DETECTED Final   Coronavirus NL63 NOT DETECTED NOT DETECTED Final   Coronavirus OC43 NOT DETECTED NOT DETECTED Final   Metapneumovirus NOT DETECTED NOT DETECTED Final   Rhinovirus / Enterovirus NOT DETECTED NOT DETECTED Final   Influenza A NOT DETECTED NOT DETECTED Final   Influenza B NOT DETECTED NOT DETECTED Final   Parainfluenza Virus 1 NOT DETECTED NOT DETECTED Final   Parainfluenza Virus 2 NOT DETECTED NOT DETECTED Final   Parainfluenza Virus 3 NOT DETECTED NOT DETECTED Final   Parainfluenza Virus 4 NOT DETECTED NOT DETECTED Final   Respiratory Syncytial Virus NOT DETECTED NOT DETECTED Final   Bordetella pertussis NOT DETECTED NOT DETECTED Final   Bordetella Parapertussis NOT DETECTED NOT DETECTED  Final   Chlamydophila pneumoniae NOT DETECTED NOT DETECTED Final   Mycoplasma pneumoniae NOT DETECTED NOT DETECTED Final    Comment:  Performed at [facility], [address], [place], [place] [address]  Aerobic/Anaerobic Culture w Gram Stain (surgical/deep wound)     Status: None (Preliminary result)   Collection Time: 09/08/22  2:06 PM   Specimen: PATH Digit amputation; Tissue  Result Value Ref Range Status   Specimen Description TISSUE  Final   Special Requests DIGIT  Final   Gram Stain NO WBC SEEN NO ORGANISMS SEEN   Final   Culture   Final    RARE STAPHYLOCOCCUS EPIDERMIDIS SUSCEPTIBILITIES TO FOLLOW CULTURE REINCUBATED FOR BETTER GROWTH Performed at [facility], [address], [place], [place] [address]    Report Status PENDING  Incomplete     Radiology Studies: ECHOCARDIOGRAM COMPLETE  Result Date: 09/08/2022    ECHOCARDIOGRAM REPORT   Patient Name:   CHELSEY ALIX Date of Exam: 09/08/2022 Medical Rec #:  [id number]      Height:       68.0 in Accession #:    [id number]     Weight:       248.0 lb Date of Birth:  1956/03/14      BSA:          2.239 m Patient Age:    67 years       BP:           97/56 mmHg Patient Gender: M              HR:           69 bpm. Exam Location:  Inpatient Procedure: 2D Echo, Cardiac Doppler, Color Doppler and Intracardiac            Opacification Agent Indications:    Elevated Troponin  History:        Patient has no prior history of Echocardiogram examinations.                 CAD, Sepsis; Risk Factors:Hypertension, Dyslipidemia, Diabetes                 and Non-Smoker.  Sonographer:    Aron Baba Referring Phys: [id number] Annasophia Crocker  Sonographer Comments: Patient is obese. IMPRESSIONS  1. Left ventricular ejection fraction, by estimation, is 55 to 60%. Left ventricular ejection fraction by PLAX is 57 %. The left ventricle has normal function. The left ventricle has no regional wall motion abnormalities. Left ventricular diastolic parameters were normal.  2. Right ventricular systolic function is normal. The right ventricular size is moderately enlarged. Tricuspid  regurgitation signal is inadequate for assessing PA pressure.  3. Right atrial size was moderately dilated.  4. The mitral valve is grossly normal. Trivial mitral valve regurgitation.  5. The aortic valve is tricuspid. Aortic valve regurgitation is not visualized. No aortic stenosis is present.  6. The inferior vena cava is dilated in size with <50% respiratory variability, suggesting right atrial pressure of 15 mmHg. Comparison(s): No prior Echocardiogram. FINDINGS  Left Ventricle: Left ventricular ejection fraction, by estimation, is 55 to 60%. Left ventricular ejection fraction by PLAX is 57 %. The left ventricle has normal function. The left ventricle has no regional wall motion abnormalities. Definity contrast agent was given IV to delineate the left ventricular endocardial borders. The left ventricular internal cavity size was normal in size. There is no left ventricular hypertrophy. Left ventricular diastolic parameters were normal. Right Ventricle:  The right ventricular size is moderately enlarged. No increase in right ventricular wall thickness. Right ventricular systolic function is normal. Tricuspid regurgitation signal is inadequate for assessing PA pressure. Left Atrium: Left atrial size was normal in size. Right Atrium: Right atrial size was moderately dilated. Pericardium: There is no evidence of pericardial effusion. Mitral Valve: The mitral valve is grossly normal. Trivial mitral valve regurgitation. Tricuspid Valve: The tricuspid valve is grossly normal. Tricuspid valve regurgitation is trivial. Aortic Valve: The aortic valve is tricuspid. Aortic valve regurgitation is not visualized. No aortic stenosis is present. Pulmonic Valve: The pulmonic valve was normal in structure. Pulmonic valve regurgitation is not visualized. Aorta: The aortic root and ascending aorta are structurally normal, with no evidence of dilitation. Venous: The inferior vena cava is dilated in size with less than 50% respiratory  variability, suggesting right atrial pressure of 15 mmHg. IAS/Shunts: No atrial level shunt detected by color flow Doppler.  LEFT VENTRICLE PLAX 2D LV EF:         Left            Diastology                ventricular     LV e' medial:    8.24 cm/s                ejection        LV E/e' medial:  14.2                fraction by     LV e' lateral:   10.30 cm/s                PLAX is 57      LV E/e' lateral: 11.4                %. LVIDd:         5.00 cm LVIDs:         3.50 cm LV PW:         1.00 cm LV IVS:        0.90 cm LVOT diam:     2.20 cm LV SV:         86 LV SV Index:   38 LVOT Area:     3.80 cm  RIGHT VENTRICLE RV S prime:     11.40 cm/s TAPSE (M-mode): 1.9 cm LEFT ATRIUM             Index        RIGHT ATRIUM           Index LA diam:        3.80 cm 1.70 cm/m   RA Area:     30.45 cm LA Vol (A2C):   55.9 ml 24.96 ml/m  RA Volume:   121.40 ml 54.21 ml/m LA Vol (A4C):   55.7 ml 24.87 ml/m LA Biplane Vol: 61.0 ml 27.24 ml/m  AORTIC VALVE LVOT Vmax:   103.00 cm/s LVOT Vmean:  71.100 cm/s LVOT VTI:    0.225 m  AORTA Ao Root diam: 3.50 cm Ao Asc diam:  3.20 cm MITRAL VALVE MV Area (PHT): 4.06 cm     SHUNTS MV Decel Time: 187 msec     Systemic VTI:  0.22 m MV E velocity: 117.00 cm/s  Systemic Diam: 2.20 cm MV A velocity: 80.30 cm/s MV E/A ratio:  1.46 Zoila Shutter MD Electronically signed by Zoila Shutter MD Signature Date/Time: 09/08/2022/10:38:47 AM    Final  Scheduled Meds:  aspirin EC  325 mg Oral QHS   atorvastatin  80 mg Oral QHS   enoxaparin (LOVENOX) injection  40 mg Subcutaneous Q24H   insulin aspart  0-9 Units Subcutaneous TID WC   metoprolol succinate  25 mg Oral Daily   pantoprazole  40 mg Oral Daily   pregabalin  150 mg Oral BID   tamsulosin  0.4 mg Oral QHS   Continuous Infusions:  piperacillin-tazobactam (ZOSYN)  IV 3.375 g (09/10/22 0454)     LOS: 5 days   Hughie Closs, MD [facility]  09/10/2022, 9:11 AM   *Please note that this is a verbal dictation therefore any  spelling or grammatical errors are due to the "Dragon Medical One" system interpretation.  Please page via Amion and do not message via secure chat for urgent patient care matters. Secure chat can be used for non urgent patient care matters.  How to contact the [facility] Attending or Consulting provider 7A - 7P or covering provider during after hours 7P -7A, for this patient?  Check the care team in [facility] and look for a) attending/consulting [facility] provider listed and b) the [facility] team listed. Page or secure chat 7A-7P. Log into [email or web address] and use [place]'s universal password to access. If you do not have the password, please contact the hospital operator. Locate the [facility] provider you are looking for under [facility] and page to a number that you can be directly reached. If you still have difficulty reaching the provider, please page the [facility] (Director on Call) for the Hospitalists listed on amion for assistance.

## 2022-09-10 NOTE — Progress Notes (Addendum)
Patient and friend visiting called RN to room.  Pt c/o discomfort and burning from penis and also discomfort from scrotum. Upon assessment penis and scrotum swollen and red.  Patient asking about testing for a yeast infection.  MD covering notified and on her way to see patient at bedside.

## 2022-09-10 NOTE — Progress Notes (Signed)
Rounding Note    Patient Name: [name] Date of Encounter: 09/10/2022  [facility] Cardiologist: Thurmon Fair, MD   Subjective   Mild positional chest discomfort, different from angina. Tmax 99.9 F.  No chills.  Denies orthopnea. No significant arrhythmia on monitor.  Did have some oxygen desaturation in the early morning hours when he fell asleep without the CPAP on. Some urinary bleeding, but sounds like it is superficial based on description.  Will check another urinalysis. Blood pressure started to creep up, as expected since he has not been receiving his usual cardiac/antihypertensive medications after presentation with sepsis.  Inpatient Medications    Scheduled Meds:  aspirin EC  325 mg Oral QHS   atorvastatin  80 mg Oral QHS   enoxaparin (LOVENOX) injection  40 mg Subcutaneous Q24H   insulin aspart  0-9 Units Subcutaneous TID WC   metoprolol succinate  25 mg Oral Daily   pantoprazole  40 mg Oral Daily   pregabalin  150 mg Oral BID   tamsulosin  0.4 mg Oral QHS   Continuous Infusions:  piperacillin-tazobactam (ZOSYN)  IV 3.375 g (09/10/22 0454)   PRN Meds: acetaminophen **OR** acetaminophen, albuterol, nitroGLYCERIN, ondansetron **OR** ondansetron (ZOFRAN) IV, polyethylene glycol, sodium chloride   Vital Signs    Vitals:   09/09/22 2249 09/09/22 2315 09/10/22 0322 09/10/22 0809  BP:  (!) 159/73 (!) 140/78 138/74  Pulse: 78 79 69 71  Resp: (!) 25 18 20 20   Temp:  99.9 F (37.7 C) 99.2 F (37.3 C) 99 F (37.2 C)  TempSrc:  Oral Oral Oral  SpO2: 90% 97% 96% 92%  Weight:      Height:        Intake/Output Summary (Last 24 hours) at 09/10/2022 0847 Last data filed at 09/10/2022 0500 Gross per 24 hour  Intake 240 ml  Output 775 ml  Net -535 ml      09/05/2022    4:07 PM 09/05/2022   10:55 AM 03/03/2022    2:00 PM  Last 3 Weights  Weight (lbs) 248 lb 0.3 oz 248 lb 257 lb  Weight (kg) 112.5 kg 112.492 kg 116.574 kg      Telemetry     Normal sinus rhythm- Personally Reviewed  ECG    No new tracing- Personally Reviewed  Physical Exam  Morbidly obese.  Lying almost supine in bed without respiratory difficulty GEN: No acute distress.   Neck: No JVD Cardiac: RRR, no murmurs, rubs, or gallops.  Respiratory: Clear to auscultation bilaterally. GI: Soft, nontender, non-distended  MS: No edema; No deformity.  Dressings on both feet Neuro:  Nonfocal  Psych: Normal affect   Labs    High Sensitivity Troponin:   Recent Labs  Lab 09/07/22 1423 09/07/22 1611  TROPONINIHS 1,200* 1,241*     Chemistry Recent Labs  Lab 09/06/22 0013 09/07/22 0019 09/07/22 1424 09/08/22 0020 09/09/22 0010  NA 137 134* 133* 134* 133*  K 3.2* 3.5 3.7 3.5 3.5  CL 100 105 104 104 103  CO2 22 21* 20* 21* 23  GLUCOSE 113* 136* 117* 133* 153*  BUN 14 17 17 17 14   CREATININE 1.22 1.08 1.27* 1.28* 1.13  CALCIUM 8.3* 8.0* 7.8* 7.8* 8.0*  PROT 5.9* 5.8*  --   --   --   ALBUMIN 2.9* 2.7*  --   --   --   [name] 26 38  --   --   --   ALT 21 33  --   --   --  ALKPHOS 58 90  --   --   --   BILITOT 1.0 1.7*  --   --   --   GFRNONAA >60 >60 >60 >60 >60  ANIONGAP 15 8 9 9 7     Lipids No results for input(s): "CHOL", "TRIG", "HDL", "LABVLDL", "LDLCALC", "CHOLHDL" in the last 168 hours.  Hematology Recent Labs  Lab 09/07/22 1424 09/08/22 0020 09/09/22 0010  WBC 5.6 5.1 3.2*  RBC 4.73 4.47 4.54  HGB 13.5 12.8* 13.3  HCT 40.9 39.8 39.7  MCV 86.5 89.0 87.4  MCH 28.5 28.6 29.3  MCHC 33.0 32.2 33.5  RDW 14.6 14.7 14.7  PLT 102* 99* 93*   Thyroid No results for input(s): "TSH", "FREET4" in the last 168 hours.  BNPNo results for input(s): "BNP", "PROBNP" in the last 168 hours.  DDimer No results for input(s): "DDIMER" in the last 168 hours.   Radiology    ECHOCARDIOGRAM COMPLETE  Result Date: 09/08/2022    ECHOCARDIOGRAM REPORT   Patient Name:   [name] Date of Exam: 09/08/2022 Medical Rec #:  [id number]      Height:       68.0 in  Accession #:    [id number]     Weight:       248.0 lb Date of Birth:  04-09-1956      BSA:          2.239 m Patient Age:    67 years       BP:           97/56 mmHg Patient Gender: M              HR:           69 bpm. Exam Location:  Inpatient Procedure: 2D Echo, Cardiac Doppler, Color Doppler and Intracardiac            Opacification Agent Indications:    Elevated Troponin  History:        Patient has no prior history of Echocardiogram examinations.                 CAD, Sepsis; Risk Factors:Hypertension, Dyslipidemia, Diabetes                 and Non-Smoker.  Sonographer:    Aron Baba Referring Phys: [id number] RAVI PAHWANI  Sonographer Comments: Patient is obese. IMPRESSIONS  1. Left ventricular ejection fraction, by estimation, is 55 to 60%. Left ventricular ejection fraction by PLAX is 57 %. The left ventricle has normal function. The left ventricle has no regional wall motion abnormalities. Left ventricular diastolic parameters were normal.  2. Right ventricular systolic function is normal. The right ventricular size is moderately enlarged. Tricuspid regurgitation signal is inadequate for assessing PA pressure.  3. Right atrial size was moderately dilated.  4. The mitral valve is grossly normal. Trivial mitral valve regurgitation.  5. The aortic valve is tricuspid. Aortic valve regurgitation is not visualized. No aortic stenosis is present.  6. The inferior vena cava is dilated in size with <50% respiratory variability, suggesting right atrial pressure of 15 mmHg. Comparison(s): No prior Echocardiogram. FINDINGS  Left Ventricle: Left ventricular ejection fraction, by estimation, is 55 to 60%. Left ventricular ejection fraction by PLAX is 57 %. The left ventricle has normal function. The left ventricle has no regional wall motion abnormalities. Definity contrast agent was given IV to delineate the left ventricular endocardial borders. The left ventricular internal cavity size was normal in size. There is no  left  ventricular hypertrophy. Left ventricular diastolic parameters were normal. Right Ventricle: The right ventricular size is moderately enlarged. No increase in right ventricular wall thickness. Right ventricular systolic function is normal. Tricuspid regurgitation signal is inadequate for assessing PA pressure. Left Atrium: Left atrial size was normal in size. Right Atrium: Right atrial size was moderately dilated. Pericardium: There is no evidence of pericardial effusion. Mitral Valve: The mitral valve is grossly normal. Trivial mitral valve regurgitation. Tricuspid Valve: The tricuspid valve is grossly normal. Tricuspid valve regurgitation is trivial. Aortic Valve: The aortic valve is tricuspid. Aortic valve regurgitation is not visualized. No aortic stenosis is present. Pulmonic Valve: The pulmonic valve was normal in structure. Pulmonic valve regurgitation is not visualized. Aorta: The aortic root and ascending aorta are structurally normal, with no evidence of dilitation. Venous: The inferior vena cava is dilated in size with less than 50% respiratory variability, suggesting right atrial pressure of 15 mmHg. IAS/Shunts: No atrial level shunt detected by color flow Doppler.  LEFT VENTRICLE PLAX 2D LV EF:         Left            Diastology                ventricular     LV e' medial:    8.24 cm/s                ejection        LV E/e' medial:  14.2                fraction by     LV e' lateral:   10.30 cm/s                PLAX is 57      LV E/e' lateral: 11.4                %. LVIDd:         5.00 cm LVIDs:         3.50 cm LV PW:         1.00 cm LV IVS:        0.90 cm LVOT diam:     2.20 cm LV SV:         86 LV SV Index:   38 LVOT Area:     3.80 cm  RIGHT VENTRICLE RV S prime:     11.40 cm/s TAPSE (M-mode): 1.9 cm LEFT ATRIUM             Index        RIGHT ATRIUM           Index LA diam:        3.80 cm 1.70 cm/m   RA Area:     30.45 cm LA Vol (A2C):   55.9 ml 24.96 ml/m  RA Volume:   121.40 ml 54.21 ml/m LA Vol  (A4C):   55.7 ml 24.87 ml/m LA Biplane Vol: 61.0 ml 27.24 ml/m  AORTIC VALVE LVOT Vmax:   103.00 cm/s LVOT Vmean:  71.100 cm/s LVOT VTI:    0.225 m  AORTA Ao Root diam: 3.50 cm Ao Asc diam:  3.20 cm MITRAL VALVE MV Area (PHT): 4.06 cm     SHUNTS MV Decel Time: 187 msec     Systemic VTI:  0.22 m MV E velocity: 117.00 cm/s  Systemic Diam: 2.20 cm MV A velocity: 80.30 cm/s MV E/A ratio:  1.46 Zoila Shutter MD Electronically signed by Iantha Fallen  Hilty MD Signature Date/Time: 09/08/2022/10:38:47 AM    Final     Cardiac Studies   Echocardiogram 09/08/2022    1. Left ventricular ejection fraction, by estimation, is 55 to 60%. Left ventricular ejection fraction by PLAX is 57 %. The left ventricle has normal function. The left ventricle has no regional wall motion abnormalities. Left ventricular diastolic parameters were normal.   2. Right ventricular systolic function is normal. The right ventricular size is moderately enlarged. Tricuspid regurgitation signal is inadequate for assessing PA pressure.   3. Right atrial size was moderately dilated.   4. The mitral valve is grossly normal. Trivial mitral valve regurgitation.   5. The aortic valve is tricuspid. Aortic valve regurgitation is not visualized. No aortic stenosis is present.   6. The inferior vena cava is dilated in size with <50% respiratory variability, suggesting right atrial pressure of 15 mmHg. Comparison(s): No prior Echocardiogram.     Patient Profile     67 y.o. male ith history of coronary artery disease (1997  right coronary artery stent, occluded at cath 2008, scar without ischemia 2022),hyperlipidemia, hypertension, type 2 diabetes mellitus complicated by peripheral neuropathy and bilateral Charcot foot, morbid obesity, OSA, recurrent problems with cellulitis, left knee arthroplasty admitted with sepsis due to osteomyelitis of the second left toe, complicated by angina and a small demand infarction.    Assessment & Plan      Demand  myocardial infarction: Echo does not show any new areas of wall motion abnormality and overall left ventricular systolic function is preserved.  This is consistent with a small area of demand myocardial injury, rather than a true acute atherothrombotic event. Sepsis: Presumably due to left toe osteomyelitis for which she has undergone amputation.  Unfortunately he has continued to have low-grade fever even following the surgery. DM: Complicated by neuropathy and bilateral Charcot arthropathy.  Fair control with hemoglobin A1c 7.4%. AKI: Mild, probably related to his episodes of hypotension.  Kidney function already improved. OSA: This is likely the reason for mild enlargement of right heart chambers.  Follow-up in sleep clinic.  May need adjustment of settings. Leukopenia/thrombocytopenia: Without significant anemia or macrocytosis.  Mild thrombocytopenia seen dating back even to 2019 but definitely present a year ago.  Consider hematology evaluation as an outpatient.      [facility] will sign off.   Medication Recommendations:  resume previous home doses of atorvastatin, isosorbide mononitrate, metoprolol succinate, triamterene-hydrochlorothiazide, WelChol, reduce aspirin to 81 mg daily  Other recommendations (labs, testing, etc): Ordered another urinalysis. Follow up as an outpatient: Will make arrangements for follow-up visit in 2-3 months.  For questions or updates, please contact [facility] Please consult [email or web address] for contact info under        Signed, Thurmon Fair, MD  09/10/2022, 8:47 AM

## 2022-09-10 NOTE — Evaluation (Signed)
Occupational Therapy Evaluation and Discharge Patient Details Name: [name] MRN: [id number] DOB: [birth year] Today's Date: 09/10/2022   History of Present Illness 67 y.o. male presents to [facility] hospital on 09/05/2022, referred from [facility] for sepsis with L 2nd toe wound. Pt with elevated troponin and chest pain on 5/26, found to have demand MI. Pt underwent L 2nd toe amputation on 5/27. PMH includes CAD, cellulitis, DMII, GERD, HLD, HTN, OSA.   Clinical Impression   Pt is typically modified independent and uses a cane. He presents with a headache (RN notified) and impaired standing balance requiring RW and supervision for safety. He can complete ADLs with set up to supervision. He has a Engineer, agricultural who will help as needed when he returns home and all DME needs are met. No further OT needs.      Recommendations for follow up therapy are one component of a multi-disciplinary discharge planning process, led by the attending physician.  Recommendations may be updated based on patient status, additional functional criteria and insurance authorization.   Assistance Recommended at Discharge PRN  Patient can return home with the following Assist for transportation;Assistance with cooking/housework;A little help with walking and/or transfers    Functional Status Assessment  Patient has had a recent decline in their functional status and demonstrates the ability to make significant improvements in function in a reasonable and predictable amount of time.  Equipment Recommendations  None recommended by OT    Recommendations for Other Services       Precautions / Restrictions Precautions Precautions: Fall Precaution Comments: bilateral charcot foot Required Braces or Orthoses: Other Brace Other Brace: post-op shoe LLE Restrictions Weight Bearing Restrictions: Yes LLE Weight Bearing: Weight bearing as tolerated Other Position/Activity Restrictions: in post-op shoe       Mobility Bed Mobility               General bed mobility comments: in chair    Transfers Overall transfer level: Needs assistance Equipment used: Rolling walker (2 wheels) Transfers: Sit to/from Stand Sit to Stand: Supervision           General transfer comment: for safety and lines, no physical assist      Balance Overall balance assessment: Needs assistance   Sitting balance-Leahy Scale: Good       Standing balance-Leahy Scale: Poor Standing balance comment: reliant on RW                           ADL either performed or assessed with clinical judgement   ADL Overall ADL's : Set up                                       General ADL Comments: Set up in sitting,  supervision standing.     Vision Baseline Vision/History: 1 Wears glasses       Perception     Praxis      Pertinent Vitals/Pain Pain Assessment Pain Assessment: Faces Faces Pain Scale: Hurts little more Pain Location: head Pain Descriptors / Indicators: Aching Pain Intervention(s): Patient requesting pain meds-RN notified     Hand Dominance Right   Extremity/Trunk Assessment Upper Extremity Assessment Upper Extremity Assessment: Overall WFL for tasks assessed   Lower Extremity Assessment Lower Extremity Assessment: Defer to PT evaluation   Cervical / Trunk Assessment Cervical / Trunk Assessment: Normal   Communication Communication  Communication: No difficulties   Cognition Arousal/Alertness: Awake/alert Behavior During Therapy: WFL for tasks assessed/performed Overall Cognitive Status: Within Functional Limits for tasks assessed                                       General Comments       Exercises     Shoulder Instructions      Home Living Family/patient expects to be discharged to:: Private residence Living Arrangements: Alone Available Help at Discharge: Family;Available PRN/intermittently;Personal care  attendant Type of Home: House Home Access: Level entry     Home Layout: One level     Bathroom Shower/Tub: Producer, television/film/video: Handicapped height     Home Equipment: Agricultural consultant (2 wheels);Rollator (4 wheels);Cane - single point;BSC/3in1;Shower seat;Grab bars - toilet;Grab bars - tub/shower;Wheelchair - Advice worker bed;Hand held shower head          Prior Functioning/Environment Prior Level of Function : Independent/Modified Independent;Driving             Mobility Comments: PRN use of SPC at baseline ADLs Comments: can perform own foot dressing changes        OT Problem List:        OT Treatment/Interventions:      OT Goals(Current goals can be found in the care plan section)    OT Frequency:      Co-evaluation              AM-PAC OT "6 Clicks" Daily Activity     Outcome Measure Help from another person eating meals?: None Help from another person taking care of personal grooming?: A Little Help from another person toileting, which includes using toliet, bedpan, or urinal?: A Little Help from another person bathing (including washing, rinsing, drying)?: A Little Help from another person to put on and taking off regular upper body clothing?: None Help from another person to put on and taking off regular lower body clothing?: A Little 6 Click Score: 20   End of Session Equipment Utilized During Treatment: Rolling walker (2 wheels);Other (comment) (post op shoe)  Activity Tolerance: Patient tolerated treatment well Patient left: in chair;with call bell/phone within reach;with family/visitor present  OT Visit Diagnosis: Other abnormalities of gait and mobility (R26.89)                Time: 1003-1016 OT Time Calculation (min): 13 min Charges:  OT General Charges $OT Visit: 1 Visit OT Evaluation $OT Eval Low Complexity: 1 Low  Berna Spare, OTR/L Acute Rehabilitation Services Office: [phone]   Evern Bio 09/10/2022, 10:36 AM

## 2022-09-11 ENCOUNTER — Inpatient Hospital Stay (HOSPITAL_COMMUNITY): Payer: Medicare PPO

## 2022-09-11 DIAGNOSIS — N5089 Other specified disorders of the male genital organs: Secondary | ICD-10-CM

## 2022-09-11 DIAGNOSIS — E11628 Type 2 diabetes mellitus with other skin complications: Secondary | ICD-10-CM

## 2022-09-11 DIAGNOSIS — L089 Local infection of the skin and subcutaneous tissue, unspecified: Secondary | ICD-10-CM | POA: Diagnosis not present

## 2022-09-11 DIAGNOSIS — A419 Sepsis, unspecified organism: Secondary | ICD-10-CM | POA: Diagnosis not present

## 2022-09-11 LAB — HIV ANTIBODY (ROUTINE TESTING W REFLEX): HIV Screen 4th Generation wRfx: NONREACTIVE

## 2022-09-11 LAB — URINE CYTOLOGY ANCILLARY ONLY
Chlamydia: NEGATIVE
Comment: NEGATIVE
Comment: NEGATIVE
Comment: NORMAL
Neisseria Gonorrhea: NEGATIVE
Trichomonas: NEGATIVE

## 2022-09-11 LAB — GLUCOSE, CAPILLARY
Glucose-Capillary: 122 mg/dL — ABNORMAL HIGH (ref 70–99)
Glucose-Capillary: 125 mg/dL — ABNORMAL HIGH (ref 70–99)
Glucose-Capillary: 166 mg/dL — ABNORMAL HIGH (ref 70–99)
Glucose-Capillary: 167 mg/dL — ABNORMAL HIGH (ref 70–99)
Glucose-Capillary: 174 mg/dL — ABNORMAL HIGH (ref 70–99)

## 2022-09-11 LAB — AEROBIC/ANAEROBIC CULTURE W GRAM STAIN (SURGICAL/DEEP WOUND)

## 2022-09-11 LAB — FUNGUS CULTURE RESULT

## 2022-09-11 LAB — FUNGUS CULTURE WITH STAIN

## 2022-09-11 LAB — RPR: RPR Ser Ql: NONREACTIVE

## 2022-09-11 MED ORDER — NYSTATIN 100000 UNIT/GM EX POWD
Freq: Three times a day (TID) | CUTANEOUS | Status: AC
  Filled 2022-09-11 (×2): qty 15

## 2022-09-11 MED ORDER — DOXYCYCLINE HYCLATE 100 MG PO TABS
100.0000 mg | ORAL_TABLET | Freq: Two times a day (BID) | ORAL | Status: DC
  Administered 2022-09-11 – 2022-09-12 (×3): 100 mg via ORAL
  Filled 2022-09-11 (×4): qty 1

## 2022-09-11 MED ORDER — NYSTATIN-TRIAMCINOLONE 100000-0.1 UNIT/GM-% EX OINT
1.0000 | TOPICAL_OINTMENT | Freq: Two times a day (BID) | CUTANEOUS | Status: DC
  Administered 2022-09-11 – 2022-09-13 (×6): 1 via TOPICAL
  Filled 2022-09-11: qty 15

## 2022-09-11 MED ORDER — SODIUM CHLORIDE 0.9 % IV SOLN
2.0000 g | INTRAVENOUS | Status: DC
  Administered 2022-09-11: 2 g via INTRAVENOUS
  Filled 2022-09-11: qty 20

## 2022-09-11 NOTE — Progress Notes (Signed)
Night shift MD notified of patient sinus brady while sleeping in the 40s as low as 39.

## 2022-09-11 NOTE — Progress Notes (Addendum)
[facility]  PROGRESS NOTE  CUSTER SESTITO ZOX:[id number] DOB: 02/22/1956 DOA: 09/05/2022 PCP: Emilio Aspen, MD   Brief HPI:   67 year old male with history of CAD s/p PCI, diabetes mellitus type 2, hypertension, hyperlipidemia, peripheral neuropathy presented with fever and chills.  He was diagnosed with sepsis due to diabetic foot ulcer versus osteomyelitis.    Assessment/Plan:   Sepsis secondary to left second toe osteomyelitis -MRI left foot concerning for distal phalanx second toe left osteomyelitis -Underwent amputation of left second toe by Dr. Ninetta Lights on 09/08/2022  Fever/scrotal swelling -Postop patient continued to have fever -Last night found to have significant erythema of scrotum, scrotal ultrasound with Doppler ordered -Started on doxycycline, received Flagyl -Culture for GC/chlamydia/syphilis/trichomonas obtained -HIV antibody ordered -Follow ultrasound results -Antibiotics will be changed from IV Zosyn to IV ceftriaxone to cover for gonorrhea -Also start nystatin powder 4 times a day   Acute hypoxic respiratory failure -Secondary to community-acquired pneumonia -Repeat chest x-ray from 09/07/2022 showed left lower lobe pneumonia -Has been on Zosyn for last 5 days -Antibiotics changed to IV Rocephin  History of CAD s/p PCI -Patient complained of chest pain on 09/07/2022 -Significantly elevated troponin 1200, 1241 -Cardiology was consulted -Echocardiogram did not show wall motion abnormality and overall left-ventricular systolic function preserved -Likely from demand ischemia -Cardiology signed off  Acute kidney injury -Resolved  Diabetes mellitus type 2 -Hemoglobin A1c 7.5 -Continue sliding scale insulin with NovoLog -CBG well-controlled  Hypertension -Blood pressure is stable -Imdur discontinued -Continue Toprol XL 25 mg daily  Hyperlipidemia -Continue statin  Peripheral neuropathy -Continue Lyrica  Medications     aspirin EC   325 mg Oral QHS   atorvastatin  80 mg Oral QHS   doxycycline  100 mg Oral Q12H   enoxaparin (LOVENOX) injection  40 mg Subcutaneous Q24H   insulin aspart  0-9 Units Subcutaneous TID WC   metoprolol succinate  25 mg Oral Daily   nystatin-triamcinolone ointment  1 Application Topical BID   pantoprazole  40 mg Oral Daily   pregabalin  150 mg Oral BID   tamsulosin  0.4 mg Oral QHS     Data Reviewed:   CBG:  Recent Labs  Lab 09/10/22 1127 09/10/22 1656 09/10/22 2127 09/11/22 0620 09/11/22 0741  GLUCAP 171* 185* 178* 122* 125*    SpO2: 93 % O2 Flow Rate (L/min): 2 L/min FiO2 (%): 32 %    Vitals:   09/10/22 2014 09/10/22 2328 09/11/22 0419 09/11/22 0733  BP: (!) 159/74 (!) 148/75 131/73 (!) 152/74  Pulse:   60 61  Resp:  (!) 23 16 13   Temp:  98.8 F (37.1 C) 97.9 F (36.6 C) 98.4 F (36.9 C)  TempSrc:  Oral Oral Oral  SpO2:  96% 93%   Weight:      Height:          Data Reviewed:  Basic Metabolic Panel: Recent Labs  Lab 09/07/22 0019 09/07/22 1424 09/08/22 0020 09/09/22 0010 09/10/22 0745  NA 134* 133* 134* 133* 135  K 3.5 3.7 3.5 3.5 3.5  CL 105 104 104 103 102  CO2 21* 20* 21* 23 23  GLUCOSE 136* 117* 133* 153* 161*  BUN 17 17 17 14 10   CREATININE 1.08 1.27* 1.28* 1.13 0.99  CALCIUM 8.0* 7.8* 7.8* 8.0* 8.2*    CBC: Recent Labs  Lab 09/07/22 0019 09/07/22 1424 09/08/22 0020 09/09/22 0010 09/10/22 0745  WBC 6.5 5.6 5.1 3.2* 4.6  NEUTROABS 5.8 5.1 4.6 2.6 3.7  HGB 14.3 13.5 12.8* 13.3 13.8  HCT 44.1 40.9 39.8 39.7 40.6  MCV 88.6 86.5 89.0 87.4 86.4  PLT 104* 102* 99* 93* 103*    LFT Recent Labs  Lab 09/06/22 0013 09/07/22 0019  [name] 26 38  ALT 21 33  ALKPHOS 58 90  BILITOT 1.0 1.7*  PROT 5.9* 5.8*  ALBUMIN 2.9* 2.7*     Antibiotics: Anti-infectives (From admission, onward)    Start     Dose/Rate Route Frequency Ordered Stop   09/11/22 0130  doxycycline (VIBRA-TABS) tablet 100 mg        100 mg Oral Every 12 hours 09/11/22  0033 09/17/22 2159   09/07/22 1600  piperacillin-tazobactam (ZOSYN) IVPB 3.375 g  Status:  Discontinued        3.375 g 100 mL/hr over 30 Minutes Intravenous Every 8 hours 09/07/22 1511 09/07/22 1513   09/07/22 1600  piperacillin-tazobactam (ZOSYN) IVPB 3.375 g        3.375 g 12.5 mL/hr over 240 Minutes Intravenous Every 8 hours 09/07/22 1514     09/06/22 1500  vancomycin (VANCOREADY) IVPB 1750 mg/350 mL  Status:  Discontinued        1,750 mg 175 mL/hr over 120 Minutes Intravenous Every 24 hours 09/05/22 1250 09/05/22 1717   09/06/22 1200  sulfamethoxazole-trimethoprim (BACTRIM DS) 800-160 MG per tablet 2 tablet  Status:  Discontinued        2 tablet Oral Every 12 hours 09/06/22 0958 09/07/22 1511   09/05/22 1400  vancomycin (VANCOCIN) IVPB 1000 mg/200 mL premix       See Hyperspace for full Linked Orders Report.   1,000 mg 200 mL/hr over 60 Minutes Intravenous  Once 09/05/22 1247 09/05/22 1601   09/05/22 1300  ceFEPIme (MAXIPIME) 2 g in sodium chloride 0.9 % 100 mL IVPB  Status:  Discontinued        2 g 200 mL/hr over 30 Minutes Intravenous Every 8 hours 09/05/22 1247 09/05/22 1649   09/05/22 1300  metroNIDAZOLE (FLAGYL) IVPB 500 mg        500 mg 100 mL/hr over 60 Minutes Intravenous  Once 09/05/22 1247 09/05/22 1456   09/05/22 1300  vancomycin (VANCOCIN) IVPB 1000 mg/200 mL premix       See Hyperspace for full Linked Orders Report.   1,000 mg 200 mL/hr over 60 Minutes Intravenous  Once 09/05/22 1247 09/05/22 1443        DVT prophylaxis: Lovenox  Code Status: Full code  Family Communication: Discussed with caregiver at bedside   CONSULTS    Subjective   Patient seen and examined, developed scrotal erythema and swelling last night.Cultures for GC/chlamydia/syphilis/trichomoniasis/HIV have been ordered Doxycycline p.o. started.  Single dose of Flagyl ordered.   Objective    Physical Examination:  General-appears in no acute distress Heart-S1-S2, regular, no murmur  auscultated Lungs-clear to auscultation bilaterally, no wheezing or crackles auscultated Abdomen-soft, nontender, no organomegaly GU-significant scrotal erythema noted, tenderness elicited at right testicle on palpation Extremities-no edema in the lower extremities Neuro-alert, oriented x3, no focal deficit noted  Status is: Inpatient:             Meredeth Ide   [facility] If 7PM-7AM, please contact night-coverage at [email or web address], Office  [phone]   09/11/2022, 8:36 AM  LOS: 6 days

## 2022-09-11 NOTE — Progress Notes (Signed)
Called by nursing, patient with penile and scrotal swelling, tenderness and erythema.  Complains of burning with urination.  Patient states he had recent unprotected sexual encounters.   Cultures for GC/chlamydia/syphilis/trichomoniasis/HIV have been ordered Doxycycline p.o. started.  Single dose of Flagyl ordered Rocephin not ordered, await culture results especially as patient is on IV Zosyn which covers some strains of gonorrhea. Patient's groin with yeast, nystatin cream ordered Ultrasound ordered

## 2022-09-11 NOTE — Progress Notes (Signed)
Pt resting on cpap with no problem

## 2022-09-12 DIAGNOSIS — A419 Sepsis, unspecified organism: Secondary | ICD-10-CM | POA: Diagnosis not present

## 2022-09-12 DIAGNOSIS — E11628 Type 2 diabetes mellitus with other skin complications: Secondary | ICD-10-CM | POA: Diagnosis not present

## 2022-09-12 DIAGNOSIS — L089 Local infection of the skin and subcutaneous tissue, unspecified: Secondary | ICD-10-CM | POA: Diagnosis not present

## 2022-09-12 DIAGNOSIS — E1142 Type 2 diabetes mellitus with diabetic polyneuropathy: Secondary | ICD-10-CM | POA: Diagnosis not present

## 2022-09-12 LAB — BASIC METABOLIC PANEL WITH GFR
Anion gap: 8 (ref 5–15)
BUN: 10 mg/dL (ref 8–23)
CO2: 27 mmol/L (ref 22–32)
Calcium: 8.4 mg/dL — ABNORMAL LOW (ref 8.9–10.3)
Chloride: 98 mmol/L (ref 98–111)
Creatinine, Ser: 0.94 mg/dL (ref 0.61–1.24)
GFR, Estimated: >60 mL/min (ref >60)
Glucose, Bld: 229 mg/dL — ABNORMAL HIGH (ref 70–99)
Potassium: 3.1 mmol/L — ABNORMAL LOW (ref 3.5–5.1)
Sodium: 133 mmol/L — ABNORMAL LOW (ref 135–145)

## 2022-09-12 LAB — GLUCOSE, CAPILLARY
Glucose-Capillary: 112 mg/dL — ABNORMAL HIGH (ref 70–99)
Glucose-Capillary: 204 mg/dL — ABNORMAL HIGH (ref 70–99)
Glucose-Capillary: 170 mg/dL — ABNORMAL HIGH (ref 70–99)
Glucose-Capillary: 190 mg/dL — ABNORMAL HIGH (ref 70–99)

## 2022-09-12 LAB — CREATININE, SERUM
Creatinine, Ser: 0.9 mg/dL (ref 0.61–1.24)
GFR, Estimated: >60 mL/min (ref >60)

## 2022-09-12 MED ORDER — POTASSIUM CHLORIDE CRYS ER 20 MEQ PO TBCR
40.0000 meq | EXTENDED_RELEASE_TABLET | Freq: Once | ORAL | Status: AC
  Administered 2022-09-12: 40 meq via ORAL
  Filled 2022-09-12: qty 2

## 2022-09-12 MED ORDER — ADULT MULTIVITAMIN W/MINERALS CH
1.0000 | ORAL_TABLET | Freq: Every day | ORAL | Status: DC
  Administered 2022-09-12 – 2022-09-13 (×2): 1
  Filled 2022-09-12 (×2): qty 1

## 2022-09-12 MED ORDER — FUROSEMIDE 40 MG PO TABS
40.0000 mg | ORAL_TABLET | Freq: Once | ORAL | Status: AC
  Administered 2022-09-12: 40 mg via ORAL
  Filled 2022-09-12: qty 1

## 2022-09-12 MED ORDER — ENSURE ENLIVE PO LIQD
237.0000 mL | Freq: Two times a day (BID) | ORAL | Status: DC
  Administered 2022-09-13: 237 mL via ORAL

## 2022-09-12 MED ORDER — SULFAMETHOXAZOLE-TRIMETHOPRIM 800-160 MG PO TABS
1.0000 | ORAL_TABLET | Freq: Two times a day (BID) | ORAL | Status: DC
  Filled 2022-09-12 (×2): qty 1

## 2022-09-12 MED ORDER — FUROSEMIDE 20 MG PO TABS: 20.0000 mg | ORAL_TABLET | Freq: Once | ORAL | Status: DC

## 2022-09-12 MED ORDER — CIPROFLOXACIN HCL 500 MG PO TABS
500.0000 mg | ORAL_TABLET | Freq: Two times a day (BID) | ORAL | Status: DC
  Administered 2022-09-12 – 2022-09-13 (×3): 500 mg via ORAL
  Filled 2022-09-12 (×4): qty 1

## 2022-09-12 NOTE — Progress Notes (Signed)
Pt places self on cpap.

## 2022-09-12 NOTE — Progress Notes (Signed)
   09/12/22 0017  BiPAP/CPAP/SIPAP  Reason BIPAP/CPAP not in use Other(comment) (pt refused)

## 2022-09-12 NOTE — Progress Notes (Addendum)
[facility]  PROGRESS NOTE  [name] ZOX:[id number] DOB: 1955-05-08 DOA: 09/05/2022 PCP: Emilio Aspen, MD   Brief HPI:   67 year old male with history of CAD s/p PCI, diabetes mellitus type 2, hypertension, hyperlipidemia, peripheral neuropathy presented with fever and chills.  He was diagnosed with sepsis due to diabetic foot ulcer versus osteomyelitis.    Assessment/Plan:   Sepsis secondary to left second toe osteomyelitis -MRI left foot concerning for distal phalanx second toe left osteomyelitis -Underwent amputation of left second toe by Dr. Ninetta Lights on 09/08/2022 -Podiatry recommend to resume Bactrim on discharge; which patient was prescribed before coming to the hospital; however Staph epidermidis is resistant to Bactrim as per culture from the surgical wound -Will start Cipro 500 twice daily for 7 days   Fever/scrotal swelling -Erythema has significantly improved; likely tinea cruris. -Improved after starting nystatin powder 4 times a day -Postop patient continued to have fever -Last night found to have significant erythema of scrotum, scrotal ultrasound with Doppler ordered; came back negative -Started on doxycycline, received Flagyl -Culture for GC/chlamydia/syphilis/trichomonas obtained are negative -HIV antibody  nonreactive -Antibiotics were changed from  IV Zosyn to IV ceftriaxone to cover for gonorrhea -Will discontinue antibiotics, started on Cipro as above.    Acute hypoxic respiratory failure -Secondary to community-acquired pneumonia -Repeat chest x-ray from 09/07/2022 showed left lower lobe pneumonia -Has been on Zosyn for last 5 days -IV patient has volume overload, he has bibasilar crackles -He was taking triamterene at home. -Will give 1 dose of Lasix 40 mg daily; wean off oxygen as tolerated -If still requiring oxygen in next 24 hours, he can be discharged on home O2  History of CAD s/p PCI -Patient complained of chest pain on  09/07/2022 -Significantly elevated troponin 1200, 1241 -Cardiology was consulted -Echocardiogram did not show wall motion abnormality and overall left-ventricular systolic function preserved -Likely from demand ischemia -Cardiology signed off  Acute kidney injury -Resolved  Diabetes mellitus type 2 -Hemoglobin A1c 7.5 -Continue sliding scale insulin with NovoLog -CBG well-controlled  Hypertension -Blood pressure is stable -Imdur discontinued -Continue Toprol XL 25 mg daily  Hyperlipidemia -Continue statin  Peripheral neuropathy -Continue Lyrica  Medications     aspirin EC  325 mg Oral QHS   atorvastatin  80 mg Oral QHS   doxycycline  100 mg Oral Q12H   enoxaparin (LOVENOX) injection  40 mg Subcutaneous Q24H   furosemide  20 mg Oral Once   insulin aspart  0-9 Units Subcutaneous TID WC   metoprolol succinate  25 mg Oral Daily   nystatin   Topical TID   nystatin-triamcinolone ointment  1 Application Topical BID   pantoprazole  40 mg Oral Daily   pregabalin  150 mg Oral BID   tamsulosin  0.4 mg Oral QHS     Data Reviewed:   CBG:  Recent Labs  Lab 09/11/22 0741 09/11/22 1200 09/11/22 1606 09/11/22 2141 09/12/22 0624  GLUCAP 125* 166* 167* 174* 112*    SpO2: (!) 89 % O2 Flow Rate (L/min): 2 L/min FiO2 (%): 32 %    Vitals:   09/11/22 1928 09/11/22 2330 09/12/22 0310 09/12/22 0732  BP: (!) 143/69 (!) 142/69 (!) 148/79 (!) 152/79  Pulse: 69 66 62 61  Resp: 20 20 19 19   Temp: 98.2 F (36.8 C) 98.2 F (36.8 C) 98.2 F (36.8 C) 98.2 F (36.8 C)  TempSrc: Oral Oral Oral Oral  SpO2: 98% 91% 92% (!) 89%  Weight:  Height:          Data Reviewed:  Basic Metabolic Panel: Recent Labs  Lab 09/07/22 0019 09/07/22 1424 09/08/22 0020 09/09/22 0010 09/10/22 0745 09/12/22 0010  NA 134* 133* 134* 133* 135  --   K 3.5 3.7 3.5 3.5 3.5  --   CL 105 104 104 103 102  --   CO2 21* 20* 21* 23 23  --   GLUCOSE 136* 117* 133* 153* 161*  --   BUN 17 17  17 14 10   --   CREATININE 1.08 1.27* 1.28* 1.13 0.99 0.90  CALCIUM 8.0* 7.8* 7.8* 8.0* 8.2*  --     CBC: Recent Labs  Lab 09/07/22 0019 09/07/22 1424 09/08/22 0020 09/09/22 0010 09/10/22 0745  WBC 6.5 5.6 5.1 3.2* 4.6  NEUTROABS 5.8 5.1 4.6 2.6 3.7  HGB 14.3 13.5 12.8* 13.3 13.8  HCT 44.1 40.9 39.8 39.7 40.6  MCV 88.6 86.5 89.0 87.4 86.4  PLT 104* 102* 99* 93* 103*    LFT Recent Labs  Lab 09/06/22 0013 09/07/22 0019  [name] 26 38  ALT 21 33  ALKPHOS 58 90  BILITOT 1.0 1.7*  PROT 5.9* 5.8*  ALBUMIN 2.9* 2.7*     Antibiotics: Anti-infectives (From admission, onward)    Start     Dose/Rate Route Frequency Ordered Stop   09/11/22 1400  cefTRIAXone (ROCEPHIN) 2 g in sodium chloride 0.9 % 100 mL IVPB        2 g 200 mL/hr over 30 Minutes Intravenous Every 24 hours 09/11/22 1130     09/11/22 0130  doxycycline (VIBRA-TABS) tablet 100 mg        100 mg Oral Every 12 hours 09/11/22 0033 09/17/22 2159   09/07/22 1600  piperacillin-tazobactam (ZOSYN) IVPB 3.375 g  Status:  Discontinued        3.375 g 100 mL/hr over 30 Minutes Intravenous Every 8 hours 09/07/22 1511 09/07/22 1513   09/07/22 1600  piperacillin-tazobactam (ZOSYN) IVPB 3.375 g  Status:  Discontinued        3.375 g 12.5 mL/hr over 240 Minutes Intravenous Every 8 hours 09/07/22 1514 09/11/22 1130   09/06/22 1500  vancomycin (VANCOREADY) IVPB 1750 mg/350 mL  Status:  Discontinued        1,750 mg 175 mL/hr over 120 Minutes Intravenous Every 24 hours 09/05/22 1250 09/05/22 1717   09/06/22 1200  sulfamethoxazole-trimethoprim (BACTRIM DS) 800-160 MG per tablet 2 tablet  Status:  Discontinued        2 tablet Oral Every 12 hours 09/06/22 0958 09/07/22 1511   09/05/22 1400  vancomycin (VANCOCIN) IVPB 1000 mg/200 mL premix       See Hyperspace for full Linked Orders Report.   1,000 mg 200 mL/hr over 60 Minutes Intravenous  Once 09/05/22 1247 09/05/22 1601   09/05/22 1300  ceFEPIme (MAXIPIME) 2 g in sodium chloride 0.9 % 100  mL IVPB  Status:  Discontinued        2 g 200 mL/hr over 30 Minutes Intravenous Every 8 hours 09/05/22 1247 09/05/22 1649   09/05/22 1300  metroNIDAZOLE (FLAGYL) IVPB 500 mg        500 mg 100 mL/hr over 60 Minutes Intravenous  Once 09/05/22 1247 09/05/22 1456   09/05/22 1300  vancomycin (VANCOCIN) IVPB 1000 mg/200 mL premix       See Hyperspace for full Linked Orders Report.   1,000 mg 200 mL/hr over 60 Minutes Intravenous  Once 09/05/22 1247 09/05/22 1443  DVT prophylaxis: Lovenox  Code Status: Full code  Family Communication: Discussed with caregiver at bedside   CONSULTS    Subjective   Patient seen, scrotal erythema significantly improved after starting nystatin powder.  Urine GC chlamydia came back negative.  RPR negative, HIV nonreactive.  Still requiring 2 L of oxygen.  Patient states that he was not on oxygen at home.  Objective    Physical Examination:  General-appears in no acute distress Heart-S1-S2, regular, no murmur auscultated Lungs-bibasilar crackles auscultated Abdomen-soft, nontender, no organomegaly Extremities-no edema in the lower extremities Neuro-alert, oriented x3, no focal deficit noted  Status is: Inpatient:             [name]   [facility] If 7PM-7AM, please contact night-coverage at [email or web address], Office  [phone]   09/12/2022, 8:00 AM  LOS: 7 days

## 2022-09-12 NOTE — Progress Notes (Signed)
SATURATION QUALIFICATIONS: (This note is used to comply with regulatory documentation for home oxygen)  Patient Saturations on Room Air at Rest = NT, pt SpO2 92% and greater on 0.5 L/min at rest  Patient Saturations on Room Air while Ambulating = NT (pt hypoxic on 1L/min while ambulating)  Patient Saturations on 2 Liters of oxygen while Ambulating = 92%  Please briefly explain why patient needs home oxygen: Pt SpO2 desat to 87% with good waveform on 1L/min O2 with exertional tasks. Pt needed 2L/min O2 [place] to maintain SpO2 92% and greater while ambulating.

## 2022-09-12 NOTE — Progress Notes (Signed)
Physical Therapy Treatment Patient Details Name: [name] MRN: [id number] DOB: 23-May-1955 Today's Date: 09/12/2022   History of Present Illness 67 y.o. male presents to [facility] hospital on 09/05/2022, referred from [facility] for sepsis with L 2nd toe wound. Pt with elevated troponin and chest pain on 5/26, found to have demand MI. Pt underwent L 2nd toe amputation on 5/27. PMH includes CAD, cellulitis, DMII, GERD, HLD, HTN, OSA.    PT Comments    Pt received in supine, agreeable to therapy session and with good participation and tolerance in gait training with emphasis on activity pacing and monitoring of VS closely to assess for supplemental O2 needs with exertion. Pt eager to wean off supplemental O2, however SpO2 desat to 87% with good waveform on 1L/min O2 with exertional tasks. Pt needed 2L/min O2 [place] to maintain SpO2 92% and greater while during gait trial. Pt SpO2 at rest WFL on 0.5L O2 [place], RN notified pt on this level at end of session sitting in chair. Pt mostly modI for transfers but needing Supervision for line mgmt/safety only during gait trial. Pt continues to benefit from PT services to progress toward functional mobility goals.   Recommendations for follow up therapy are one component of a multi-disciplinary discharge planning process, led by the attending physician.  Recommendations may be updated based on patient status, additional functional criteria and insurance authorization.  Follow Up Recommendations       Assistance Recommended at Discharge PRN  Patient can return home with the following A little help with bathing/dressing/bathroom;Assistance with cooking/housework;Help with stairs or ramp for entrance   Equipment Recommendations  None recommended by PT (He may have supplemental O2 needs)    Recommendations for Other Services       Precautions / Restrictions Precautions Precautions: Fall Precaution Comments: bilateral charcot foot Required Braces or Orthoses:  Other Brace Other Brace: post-op shoe LLE Restrictions Weight Bearing Restrictions: Yes LLE Weight Bearing: Weight bearing as tolerated Other Position/Activity Restrictions: in post-op shoe     Mobility  Bed Mobility Overal bed mobility: Modified Independent             General bed mobility comments: no assist needed    Transfers Overall transfer level: Needs assistance Equipment used: Rolling walker (2 wheels) Transfers: Sit to/from Stand Sit to Stand: Modified independent (Device/Increase time)           General transfer comment: no assist needed    Ambulation/Gait Ambulation/Gait assistance: Supervision Gait Distance (Feet): 800 Feet Assistive device: Rolling walker (2 wheels) Gait Pattern/deviations: Step-through pattern       General Gait Details: slowed step-through gait, SpO2 91% and greater on 2L/min (mostly 92% and greater, brief desat on 2L but with a few seconds standing break improved to 92%). Pt requested to wean to RA, however once O2 [place] decreased to 1L, SpO2 reading 87% within <30 seconds and even with standing break, does not improve to goal which is 92% and above. Reviewed this with pt who was receptive, good waveform throughout ambulation.   Stairs Stairs:  (pt has level entry home and defers to attempt curb step)           Wheelchair Mobility    Modified Rankin (Stroke Patients Only)       Balance Overall balance assessment: Needs assistance Sitting-balance support: No upper extremity supported, Feet supported Sitting balance-Leahy Scale: Good     Standing balance support: Single extremity supported Standing balance-Leahy Scale: Fair Standing balance comment: reliant on RW for  gait but able to take a couple steps with single UE support no LOB                            Cognition Arousal/Alertness: Awake/alert Behavior During Therapy: Flat affect Overall Cognitive Status: Within Functional Limits for tasks  assessed                                 General Comments: Pt self-directed        Exercises      General Comments General comments (skin integrity, edema, etc.): see gait comments for SpO2; pt reluctant to stay on supplemental O2 as he did not need it at home prior to admission, but hypoxic on 1L with exertion. At rest, pt SpO2 WFL on 0.5L/min, RN agreeable to keep wall O2 at this level at end of session to work on weaning to RA at rest. Pt states he is compliant with CPAP at night.      Pertinent Vitals/Pain Pain Assessment Pain Assessment: No/denies pain Pain Intervention(s): Monitored during session, Repositioned     PT Goals (current goals can now be found in the care plan section) Acute Rehab PT Goals Patient Stated Goal: to return to independence PT Goal Formulation: With patient Time For Goal Achievement: 09/23/22 Progress towards PT goals: Progressing toward goals    Frequency    Min 2X/week      PT Plan Current plan remains appropriate       AM-PAC PT "6 Clicks" Mobility   Outcome Measure  Help needed turning from your back to your side while in a flat bed without using bedrails?: None Help needed moving from lying on your back to sitting on the side of a flat bed without using bedrails?: None Help needed moving to and from a bed to a chair (including a wheelchair)?: A Little Help needed standing up from a chair using your arms (e.g., wheelchair or bedside chair)?: A Little Help needed to walk in hospital room?: A Little Help needed climbing 3-5 steps with a railing? : A Little (pt defers; anticipated based on his progress today) 6 Click Score: 20    End of Session Equipment Utilized During Treatment: Gait belt;Oxygen Activity Tolerance: Patient tolerated treatment well Patient left: in chair;with call bell/phone within reach (pt agreeable to use call bell for OOB mobility needs) Nurse Communication: Mobility status;Other (comment) (pt  hypoxic on 1L with exertional tasks, but [facility] resting as low as 0.5L in chair. Needs portable O2 if ambulating.) PT Visit Diagnosis: Other abnormalities of gait and mobility (R26.89)     Time: 1700-1730 PT Time Calculation (min) (ACUTE ONLY): 30 min  Charges:  $Gait Training: 8-22 mins $Therapeutic Activity: 8-22 mins                     Terryl Molinelli P., PTA Acute Rehabilitation Services Secure Chat Preferred 9a-5:30pm Office: [phone]    Dorathy Kinsman [facility] 09/12/2022, 6:01 PM

## 2022-09-12 NOTE — Progress Notes (Signed)
Initial Nutrition Assessment  DOCUMENTATION CODES:   Obesity unspecified  INTERVENTION:   - Liberalize diet to Carb Modified to promote PO intake and provide additional choices at mealtimes  - Ensure Enlive po BID, each supplement provides 350 kcal and 20 grams of protein  - MVI with minerals daily  NUTRITION DIAGNOSIS:   Increased nutrient needs related to acute illness as evidenced by estimated needs.  GOAL:   Patient will meet greater than or equal to 90% of their needs  MONITOR:   PO intake, Supplement acceptance, Labs, Weight trends, Skin  REASON FOR ASSESSMENT:   Consult Assessment of nutrition requirement/status  ASSESSMENT:   67 year old male who presented to the ED on 5/24 with nausea and weakness. PMH of CAD s/p PCI, T2DM, HTN, HLD, peripheral neuropathy, GERD. Pt admitted with sepsis, R second toe diabetic foot infection.  05/27 - s/p amputation left 2nd toe  Spoke with pt at bedside. Pt reports good/normal appetite at home but decreased appetite this admission. Pt shares that he typically eats 2-3 meals daily. On weekdays, pt usually skips breakfast. For lunch, he may have chopped steak and a salad or chicken fingers and a salad from [employer]. For supper, pt has whatever his daughter is planning.  Pt shares that he has lost about 60 lbs over the last year. Pt lost his wife about 1 year ago. Pt reports working out at [employer] starting in October 2023 as well as changing his diet to eat more healthfully. He reports that he used to weigh about 300 lbs but now weighs in the 240 lb range. Reviewed weight history in chart. Pt with a 14.3 kg weight loss since 01/26/22. This is an 11.3% weight loss in 7.5 months which is not clinically significant for timeframe. Based on interview, suspect majority of weight loss was intentional.  Pt reports that his appetite has been decreased this admission due to fever and pain. He states that his appetite is slowly  improving. For breakfast this morning, he was able to eat 100% of an egg McMuffin. Pt willing to try oral nutrition supplements to aid in meeting increased nutrient needs. Pt typically takes a daily MVI with minerals as well as vitamin D. He is willing to have a MVI ordered for him here. Will also liberalize diet to Carb Modified to promote PO intake.  Meal Completion: 0-80%  Medications reviewed and include: lasix 40 mg x 1, SSI, protonix, klor-con 40 mEq x 1, IV abx  Labs reviewed: sodium 133, potassium 3.1 CBG's: 112-204 x 24 hours  NUTRITION - FOCUSED PHYSICAL EXAM:  Flowsheet Row Most Recent Value  Orbital Region No depletion  Upper Arm Region Mild depletion  Thoracic and Lumbar Region No depletion  Buccal Region No depletion  Temple Region No depletion  Clavicle Bone Region Mild depletion  Clavicle and Acromion Bone Region Mild depletion  Scapular Bone Region No depletion  Dorsal Hand No depletion  Patellar Region No depletion  Anterior Thigh Region Mild depletion  Posterior Calf Region Mild depletion  Edema (RD Assessment) Mild  Hair Reviewed  Eyes Reviewed  Mouth Reviewed  Skin Reviewed  Nails Reviewed       Diet Order:   Diet Order             Diet Carb Modified Fluid consistency: Thin; Room service appropriate? Yes  Diet effective now                   EDUCATION NEEDS:  Education needs have been addressed  Skin:  Skin Assessment: Skin Integrity Issues: Diabetic Ulcer: R foot Incisions: s/p amputation L 2nd toe  Last BM:  09/11/22 large type 5  Height:   Ht Readings from Last 1 Encounters:  09/05/22 5\' 8"  (1.727 m)    Weight:   Wt Readings from Last 1 Encounters:  09/05/22 112.5 kg    BMI:  Body mass index is 37.71 kg/m.  Estimated Nutritional Needs:   Kcal:  2150-2350  Protein:  110-130 grams  Fluid:  2.1-2.3 L    Mertie Clause, MS, RD, LDN Inpatient Clinical Dietitian Please see AMiON for contact information.

## 2022-09-13 DIAGNOSIS — A419 Sepsis, unspecified organism: Secondary | ICD-10-CM | POA: Diagnosis not present

## 2022-09-13 DIAGNOSIS — L089 Local infection of the skin and subcutaneous tissue, unspecified: Secondary | ICD-10-CM | POA: Diagnosis not present

## 2022-09-13 LAB — BASIC METABOLIC PANEL
Anion gap: 8 (ref 5–15)
BUN: 11 mg/dL (ref 8–23)
CO2: 28 mmol/L (ref 22–32)
Calcium: 8.7 mg/dL — ABNORMAL LOW (ref 8.9–10.3)
Chloride: 101 mmol/L (ref 98–111)
Creatinine, Ser: 0.85 mg/dL (ref 0.61–1.24)
GFR, Estimated: >60 mL/min (ref >60)
Glucose, Bld: 137 mg/dL — ABNORMAL HIGH (ref 70–99)
Potassium: 3.2 mmol/L — ABNORMAL LOW (ref 3.5–5.1)
Sodium: 137 mmol/L (ref 135–145)

## 2022-09-13 LAB — GLUCOSE, CAPILLARY
Glucose-Capillary: 119 mg/dL — ABNORMAL HIGH (ref 70–99)
Glucose-Capillary: 213 mg/dL — ABNORMAL HIGH (ref 70–99)

## 2022-09-13 LAB — AEROBIC/ANAEROBIC CULTURE W GRAM STAIN (SURGICAL/DEEP WOUND): Gram Stain: NONE SEEN

## 2022-09-13 LAB — MAGNESIUM: Magnesium: 1.7 mg/dL (ref 1.7–2.4)

## 2022-09-13 MED ORDER — POTASSIUM CHLORIDE CRYS ER 20 MEQ PO TBCR
40.0000 meq | EXTENDED_RELEASE_TABLET | ORAL | Status: AC
  Administered 2022-09-13 (×2): 40 meq via ORAL
  Filled 2022-09-13 (×2): qty 2

## 2022-09-13 MED ORDER — CIPROFLOXACIN HCL 500 MG PO TABS: 500.0000 mg | ORAL_TABLET | Freq: Two times a day (BID) | ORAL | 0 refills | Status: AC

## 2022-09-13 MED ORDER — CLOTRIMAZOLE 1 % EX CREA: 1.0000 | TOPICAL_CREAM | Freq: Two times a day (BID) | CUTANEOUS | 0 refills | Status: AC

## 2022-09-13 NOTE — Progress Notes (Signed)
Nurse requested Mobility Specialist to perform oxygen saturation test with pt which includes removing pt from oxygen both at rest and while ambulating.  Below are the results from that testing.     Patient Saturations on Room Air at Rest = spO2 95%  Patient Saturations on Room Air while Ambulating = sp02 94% .    At end of testing pt left in room on RA.  Reported results to nurse.

## 2022-09-13 NOTE — Plan of Care (Signed)
Patient ID: [name], male   DOB: 11-24-1955, 67 y.o.   MRN: [id number]  Problem: Education: Goal: Knowledge of General Education information will improve Description: Including pain rating scale, medication(s)/side effects and non-pharmacologic comfort measures Outcome: Adequate for Discharge   Problem: Health Behavior/Discharge Planning: Goal: Ability to manage health-related needs will improve Outcome: Adequate for Discharge   Problem: Clinical Measurements: Goal: Ability to maintain clinical measurements within normal limits will improve Outcome: Adequate for Discharge Goal: Will remain free from infection Outcome: Adequate for Discharge Goal: Diagnostic test results will improve Outcome: Adequate for Discharge Goal: Respiratory complications will improve Outcome: Adequate for Discharge Goal: Cardiovascular complication will be avoided Outcome: Adequate for Discharge   Problem: Activity: Goal: Risk for activity intolerance will decrease Outcome: Adequate for Discharge   Problem: Nutrition: Goal: Adequate nutrition will be maintained Outcome: Adequate for Discharge   Problem: Coping: Goal: Level of anxiety will decrease Outcome: Adequate for Discharge   Problem: Elimination: Goal: Will not experience complications related to bowel motility Outcome: Adequate for Discharge Goal: Will not experience complications related to urinary retention Outcome: Adequate for Discharge   Problem: Pain Managment: Goal: General experience of comfort will improve Outcome: Adequate for Discharge   Problem: Safety: Goal: Ability to remain free from injury will improve Outcome: Adequate for Discharge   Problem: Skin Integrity: Goal: Risk for impaired skin integrity will decrease Outcome: Adequate for Discharge   Problem: Education: Goal: Ability to describe self-care measures that may prevent or decrease complications (Diabetes Survival Skills Education) will improve Outcome:  Adequate for Discharge Goal: Individualized Educational Video(s) Outcome: Adequate for Discharge   Problem: Coping: Goal: Ability to adjust to condition or change in health will improve Outcome: Adequate for Discharge   Problem: Fluid Volume: Goal: Ability to maintain a balanced intake and output will improve Outcome: Adequate for Discharge   Problem: Health Behavior/Discharge Planning: Goal: Ability to identify and utilize available resources and services will improve Outcome: Adequate for Discharge Goal: Ability to manage health-related needs will improve Outcome: Adequate for Discharge   Problem: Metabolic: Goal: Ability to maintain appropriate glucose levels will improve Outcome: Adequate for Discharge   Problem: Nutritional: Goal: Maintenance of adequate nutrition will improve Outcome: Adequate for Discharge Goal: Progress toward achieving an optimal weight will improve Outcome: Adequate for Discharge   Problem: Skin Integrity: Goal: Risk for impaired skin integrity will decrease Outcome: Adequate for Discharge   Problem: Tissue Perfusion: Goal: Adequacy of tissue perfusion will improve Outcome: Adequate for Discharge   Problem: Acute Rehab PT Goals(only PT should resolve) Goal: Patient Will Transfer Sit To/From Stand Outcome: Adequate for Discharge Goal: Pt Will Ambulate Outcome: Adequate for Discharge   Problem: Increased Nutrient Needs (NI-5.1) Goal: Food and/or nutrient delivery Description: Individualized approach for food/nutrient provision. Outcome: Adequate for Discharge  Lidia Collum, RN

## 2022-09-13 NOTE — Progress Notes (Signed)
Mobility Specialist Progress Note    09/13/22 0948  Mobility  Activity Ambulated with assistance in hallway  Level of Assistance Standby assist, set-up cues, supervision of patient - no hands on  Assistive Device Front wheel walker  Distance Ambulated (ft) 350 ft  LLE Weight Bearing WBAT  Activity Response Tolerated well  Mobility Referral Yes  $Mobility charge 1 Mobility  Mobility Specialist Start Time (ACUTE ONLY) 0935  Mobility Specialist Stop Time (ACUTE ONLY) 0946  Mobility Specialist Time Calculation (min) (ACUTE ONLY) 11 min   During Mobility: 87 HR, 94% SpO2 Post-Mobility: 77 HR, 97% SpO2  Pt received coming out of bathroom and agreeable. No complaints. SpO2 in 90s on RA. Returned to chair with call bell in reach.   Ravine Nation Mobility Specialist  Please Neurosurgeon or Rehab Office at [phone]

## 2022-09-13 NOTE — Discharge Summary (Signed)
[facility] Discharge Summary   Patient: [name] ZOX:[id number]  PCP: Emilio Aspen, MD  Date of admission: 09/05/2022   Date of discharge:  09/13/2022     Discharge Diagnoses:  Principal Problem:   Sepsis due to skin infection [facility]) Active Problems:   CAD (coronary artery disease), native coronary artery   Essential hypertension   Hyperlipidemia   Polyneuropathy due to type 2 diabetes mellitus (HCC)   Type 2 diabetes mellitus with left diabetic foot infection (HCC)   Foot ulcer due to secondary DM (HCC)   Admitted From: Home Disposition:  Home   Recommendations for Outpatient Follow-up:  Follow-up with PCP in 1 week, repeat BMP to check electrolytes. Follow-up with podiatry in 1 week for postop check. Follow up LABS/TEST:  BMP in 1wk   Follow-up Information     McDonald, Rachelle Hora, DPM Follow up in 1 week(s).   Specialty: Podiatry Contact information: [address] [place] [address] [phone]         Emilio Aspen, MD Follow up in 1 week(s).   Specialty: Internal Medicine Contact information: [address] [place] [address] [phone]                Diet recommendation: Cardiac and Carb modified diet  Activity: The patient is advised to gradually reintroduce usual activities, as tolerated  Discharge Condition: stable  Code Status: Full code   History of present illness: As per the H and P dictated on admission Hospital Course:  67 year old male with history of CAD s/p PCI, diabetes mellitus type 2, hypertension, hyperlipidemia, peripheral neuropathy presented with fever and chills. He was diagnosed with sepsis due to diabetic foot ulcer versus osteomyelitis.  Assessment/Plan:  # Sepsis secondary to left second toe osteomyelitis MRI left foot concerning for distal phalanx second toe left osteomyelitis Underwent amputation of left second toe by Dr. Ninetta Lights on 09/08/2022 Podiatry recommend to  resume Bactrim on discharge; which patient was prescribed before coming to the hospital; however Staph epidermidis is resistant to Bactrim as per culture from the surgical wound. started Cipro 500 twice daily for 7 days # Fever/scrotal swelling, Erythema has significantly improved; likely tinea cruris. Improved after starting nystatin powder 4 times a day Postop patient continued to have fever, at night found to have significant erythema of scrotum, scrotal ultrasound with Doppler ordered; came back negative. S/p doxycycline and  Flagyl. Culture for GC/chlamydia/syphilis/trichomonas obtained are negative. HIV antibody  nonreactive. S/p IV Zosyn and IV ceftriaxone to cover for gonorrhea. Now started on Cipro as above.  Discharged on clotrimazole 1% twice daily for 4 weeks.  Patient was advised to clean the area before each application and follow with PCP.  # Acute hypoxic respiratory failure, Secondary to community-acquired pneumonia -Repeat chest x-ray from 09/07/2022 showed left lower lobe pneumonia. S/p Zosyn for last 5 days. patient had volume overload, he has bibasilar crackles. He was taking triamterene at home. S/p 1 dose of Lasix 40 mg daily, supplemental O2 relation weaned off.  Currently saturating well on room air.  Resumed home diuretic on discharge. # History of CAD s/p PCI, Patient complained of chest pain on 09/07/2022 Significantly elevated troponin 1200, 1241, Cardiology was consulted. Echocardiogram did not show wall motion abnormality and overall left-ventricular systolic function preserved.  Elevated troponin most likely due to demand ischemia. Cardiology signed off # Acute kidney injury, Resolved # Diabetes mellitus type 2, Hemoglobin A1c 7.5, s/p Sliding scale insulin with NovoLog CBG well-controlled.  Resumed home regimen discharge.  Patient was advised to monitor CBG and continue diabetic diet. # Hypertension, resumed home home meds on discharge.  Patient was advised to monitor BP at  home and follow with PCP.  # Hyperlipidemia, Continue statin Peripheral neuropathy, Continue Lyrica  Body mass index is 37.71 kg/m.  Nutrition Problem: Increased nutrient needs Etiology: acute illness Nutrition Interventions: Interventions: Ensure Enlive (each supplement provides 350kcal and 20 grams of protein), MVI, Liberalize Diet  Patient was ambulatory without any assistance. Patient was seen by physical therapy, who recommended no therapy needed on discharge, On the day of the discharge the patient's vitals were stable, and no other acute medical condition were reported by patient. the patient was felt safe to be discharge at Home.  Consultants: Podiatrist and cardiology Procedures: s/p left second toe amputation done on 09/08/2022  Discharge Exam: General: Appear in no distress, no Rash; Oral Mucosa Clear, moist. Cardiovascular: S1 and S2 Present, no Murmur, Respiratory: normal respiratory effort, Bilateral Air entry present and no Crackles, no wheezes Abdomen: Bowel Sound present, Soft and no tenderness, no hernia Extremities: no Pedal edema, no calf tenderness, s/p left second toe amputation, dressing CDI. Neurology: alert and oriented to time, place, and person affect appropriate.  Filed Weights   09/05/22 1055 09/05/22 1607  Weight: 112.5 kg 112.5 kg   Vitals:   09/13/22 0731 09/13/22 1108  BP: (!) 147/70 134/70  Pulse: 65 64  Resp: 18 19  Temp: 98 F (36.7 C) 97.6 F (36.4 C)  SpO2: 97% 94%    DISCHARGE MEDICATION: Allergies as of 09/13/2022       Reactions   Metformin Other (See Comments)        Medication List     STOP taking these medications    Menthol (Topical Analgesic) 4 % Gel   Mounjaro 2.5 MG/0.5ML Pen Generic drug: tirzepatide   sulfamethoxazole-trimethoprim 800-160 MG tablet Commonly known as: BACTRIM DS   tamsulosin 0.4 MG Caps capsule Commonly known as: FLOMAX       TAKE these medications    acetaminophen 650 MG CR  tablet Commonly known as: TYLENOL Take 1,300 mg by mouth at bedtime.   aspirin EC 325 MG tablet Take 325 mg by mouth at bedtime.   atorvastatin 80 MG tablet Commonly known as: LIPITOR Take 80 mg by mouth at bedtime.   celecoxib 200 MG capsule Commonly known as: CELEBREX TAKE 1 CAPSULE BY MOUTH EVERY DAY What changed: how much to take   ciprofloxacin 500 MG tablet Commonly known as: CIPRO Take 1 tablet (500 mg total) by mouth 2 (two) times daily for 7 days.   clotrimazole 1 % cream Commonly known as: LOTRIMIN Apply 1 Application topically 2 (two) times daily. Apply to groin area twice a day, clean before each application.   empagliflozin 25 MG Tabs tablet Commonly known as: JARDIANCE Take 1 tablet (25 mg total) by mouth daily.   isosorbide mononitrate 120 MG 24 hr tablet Commonly known as: IMDUR TAKE 1 TABLET BY MOUTH EVERY DAY   metoprolol succinate 100 MG 24 hr tablet Commonly known as: TOPROL-XL TAKE 1 TABLET BY MOUTH DAILY. PLEASE CALL TO SCHEDULE AN OVERDUE APPOINTMENT WITH DR. Katrinka Blazing FOR REFILLS, [phone], THANK YOU. 3RD ATTEMPT. NO MORE REFILLS GIVEN What changed: See the new instructions.   multivitamin tablet Take 1 tablet by mouth daily.   nitroGLYCERIN 0.4 MG SL tablet Commonly known as: Nitrostat Place 1 tablet (0.4 mg total) under the tongue every 5 (five) minutes  x 3 doses as needed for chest pain.   omeprazole 20 MG capsule Commonly known as: PRILOSEC Take 20 mg by mouth daily.   pregabalin 150 MG capsule Commonly known as: LYRICA Take 1 capsule (150 mg total) by mouth 2 (two) times daily.   senna-docusate 8.6-50 MG tablet Commonly known as: Senokot S Take 1 tablet by mouth at bedtime as needed. What changed:  how much to take when to take this   triamterene-hydrochlorothiazide 75-50 MG tablet Commonly known as: MAXZIDE Take 0.5 tablets by mouth daily.   VISINE OP Place 1 drop into both eyes daily as needed (for dry or irritated  eyes).   Vitamin D3 50 MCG (2000 UT) Tabs Take 2,000 Units by mouth daily.   Welchol 625 MG tablet Generic drug: colesevelam Take 1,875 mg by mouth at bedtime.               Discharge Care Instructions  (From admission, onward)           Start     Ordered   09/13/22 0000  Discharge wound care:       Comments: As per podiatry recommendation   09/13/22 1320           Allergies  Allergen Reactions   Metformin Other (See Comments)   Discharge Instructions     Call MD for:  difficulty breathing, headache or visual disturbances   Complete by: As directed    Call MD for:  extreme fatigue   Complete by: As directed    Call MD for:  persistant dizziness or light-headedness   Complete by: As directed    Call MD for:  persistant nausea and vomiting   Complete by: As directed    Call MD for:  severe uncontrolled pain   Complete by: As directed    Call MD for:  temperature >100.4   Complete by: As directed    Diet - low sodium heart healthy   Complete by: As directed    Discharge instructions   Complete by: As directed    Follow-up with PCP in 1 week, repeat BMP to check electrolytes. Follow-up with podiatry in 1 week for postop check.   Discharge wound care:   Complete by: As directed    As per podiatry recommendation   Increase activity slowly   Complete by: As directed        The results of significant diagnostics from this hospitalization (including imaging, microbiology, ancillary and laboratory) are listed below for reference.    Significant Diagnostic Studies: US SCROTUM W/DOPPLER  Result Date: 09/11/2022 CLINICAL DATA:  Scrotal swelling EXAM: SCROTAL ULTRASOUND DOPPLER ULTRASOUND OF THE TESTICLES TECHNIQUE: Complete ultrasound examination of the testicles, epididymis, and other scrotal structures was performed. Color and spectral Doppler ultrasound were also utilized to evaluate blood flow to the testicles. COMPARISON:  None Available. FINDINGS:  Right testicle Measurements: 4.6 x 2.8 x 2.5 cm. Hypoechoic focus measures 3 x 2 x 2 mm. Left testicle Measurements: 4.7 x 2.8 x 1.8 cm. No mass or microlithiasis visualized. Right epididymis:  Normal in size and appearance. Left epididymis:  Normal in size and appearance. Hydrocele:  None visualized. Varicocele:  None visualized. Pulsed Doppler interrogation of both testes demonstrates normal low resistance arterial and venous waveforms bilaterally. IMPRESSION: 1. No evidence of testicular torsion. 2. Right intratesticular cyst measures 3 mm. Electronically Signed   By: Agustin Cree M.D.   On: 09/11/2022 15:49   ECHOCARDIOGRAM COMPLETE  Result Date: 09/08/2022  ECHOCARDIOGRAM REPORT   Patient Name:   [name] Date of Exam: 09/08/2022 Medical Rec #:  [id number]      Height:       68.0 in Accession #:    [id number]     Weight:       248.0 lb Date of Birth:  07-24-1955      BSA:          2.239 m Patient Age:    67 years       BP:           97/56 mmHg Patient Gender: M              HR:           69 bpm. Exam Location:  Inpatient Procedure: 2D Echo, Cardiac Doppler, Color Doppler and Intracardiac            Opacification Agent Indications:    Elevated Troponin  History:        Patient has no prior history of Echocardiogram examinations.                 CAD, Sepsis; Risk Factors:Hypertension, Dyslipidemia, Diabetes                 and Non-Smoker.  Sonographer:    Aron Baba Referring Phys: [id number] RAVI PAHWANI  Sonographer Comments: Patient is obese. IMPRESSIONS  1. Left ventricular ejection fraction, by estimation, is 55 to 60%. Left ventricular ejection fraction by PLAX is 57 %. The left ventricle has normal function. The left ventricle has no regional wall motion abnormalities. Left ventricular diastolic parameters were normal.  2. Right ventricular systolic function is normal. The right ventricular size is moderately enlarged. Tricuspid regurgitation signal is inadequate for assessing PA pressure.  3. Right  atrial size was moderately dilated.  4. The mitral valve is grossly normal. Trivial mitral valve regurgitation.  5. The aortic valve is tricuspid. Aortic valve regurgitation is not visualized. No aortic stenosis is present.  6. The inferior vena cava is dilated in size with <50% respiratory variability, suggesting right atrial pressure of 15 mmHg. Comparison(s): No prior Echocardiogram. FINDINGS  Left Ventricle: Left ventricular ejection fraction, by estimation, is 55 to 60%. Left ventricular ejection fraction by PLAX is 57 %. The left ventricle has normal function. The left ventricle has no regional wall motion abnormalities. Definity contrast agent was given IV to delineate the left ventricular endocardial borders. The left ventricular internal cavity size was normal in size. There is no left ventricular hypertrophy. Left ventricular diastolic parameters were normal. Right Ventricle: The right ventricular size is moderately enlarged. No increase in right ventricular wall thickness. Right ventricular systolic function is normal. Tricuspid regurgitation signal is inadequate for assessing PA pressure. Left Atrium: Left atrial size was normal in size. Right Atrium: Right atrial size was moderately dilated. Pericardium: There is no evidence of pericardial effusion. Mitral Valve: The mitral valve is grossly normal. Trivial mitral valve regurgitation. Tricuspid Valve: The tricuspid valve is grossly normal. Tricuspid valve regurgitation is trivial. Aortic Valve: The aortic valve is tricuspid. Aortic valve regurgitation is not visualized. No aortic stenosis is present. Pulmonic Valve: The pulmonic valve was normal in structure. Pulmonic valve regurgitation is not visualized. Aorta: The aortic root and ascending aorta are structurally normal, with no evidence of dilitation. Venous: The inferior vena cava is dilated in size with less than 50% respiratory variability, suggesting right atrial pressure of 15 mmHg. IAS/Shunts:  No atrial level shunt detected by  color flow Doppler.  LEFT VENTRICLE PLAX 2D LV EF:         Left            Diastology                ventricular     LV e' medial:    8.24 cm/s                ejection        LV E/e' medial:  14.2                fraction by     LV e' lateral:   10.30 cm/s                PLAX is 57      LV E/e' lateral: 11.4                %. LVIDd:         5.00 cm LVIDs:         3.50 cm LV PW:         1.00 cm LV IVS:        0.90 cm LVOT diam:     2.20 cm LV SV:         86 LV SV Index:   38 LVOT Area:     3.80 cm  RIGHT VENTRICLE RV S prime:     11.40 cm/s TAPSE (M-mode): 1.9 cm LEFT ATRIUM             Index        RIGHT ATRIUM           Index LA diam:        3.80 cm 1.70 cm/m   RA Area:     30.45 cm LA Vol (A2C):   55.9 ml 24.96 ml/m  RA Volume:   121.40 ml 54.21 ml/m LA Vol (A4C):   55.7 ml 24.87 ml/m LA Biplane Vol: 61.0 ml 27.24 ml/m  AORTIC VALVE LVOT Vmax:   103.00 cm/s LVOT Vmean:  71.100 cm/s LVOT VTI:    0.225 m  AORTA Ao Root diam: 3.50 cm Ao Asc diam:  3.20 cm MITRAL VALVE MV Area (PHT): 4.06 cm     SHUNTS MV Decel Time: 187 msec     Systemic VTI:  0.22 m MV E velocity: 117.00 cm/s  Systemic Diam: 2.20 cm MV A velocity: 80.30 cm/s MV E/A ratio:  1.46 Zoila Shutter MD Electronically signed by Zoila Shutter MD Signature Date/Time: 09/08/2022/10:38:47 AM    Final    MR FOOT LEFT W WO CONTRAST  Result Date: 09/07/2022 CLINICAL DATA:  Foot ulcer on second toe, concern for osteomyelitis EXAM: MRI OF THE LEFT FOREFOOT WITHOUT AND WITH CONTRAST TECHNIQUE: Multiplanar, multisequence MR imaging of the left forefoot was performed both before and after administration of intravenous contrast. CONTRAST:  10mL GADAVIST GADOBUTROL 1 MMOL/ML IV SOLN COMPARISON:  Radiographs dated Sep 05, 2018 FINDINGS: Bones/Joint/Cartilage Bone marrow edema of the distal phalanx of the second digit with enhancement on post contrast sequences concerning for osteomyelitis. Marrow signal within remaining osseous  structures is within normal limits. No evidence of acute fracture or dislocation. Ligaments Lisfranc and collateral ligaments appear intact. Muscles and Tendons Increased intramuscular signal of the plantar muscles suggesting myopathy/myositis. Flexor and extensor tendons appear intact. Soft tissues Skin irregularity and inflammatory changes about the distal phalanx of the second digit suggesting open wound. Soft tissue edema about the dorsum of  the foot without evidence of fluid collection or abscess. IMPRESSION: 1. Bone marrow edema of the distal phalanx of the second digit with enhancement on post contrast sequences concerning for osteomyelitis. 2. Skin irregularity and inflammatory changes about the distal phalanx of the second digit suggesting open wound. 3. Soft tissue edema about the dorsum of the foot without evidence of fluid collection or abscess. 4. Increased intramuscular signal of the plantar muscles suggesting myopathy/myositis. Electronically Signed   By: Larose Hires D.O.   On: 09/07/2022 22:49   DG CHEST PORT 1 VIEW  Result Date: 09/07/2022 CLINICAL DATA:  Pneumonia. EXAM: PORTABLE CHEST 1 VIEW COMPARISON:  09/05/2022 FINDINGS: Stable cardiomediastinal contours. Unchanged hazy opacity in the left base which may reflect atelectasis or pneumonia. No signs of pleural effusion or interstitial edema. The visualized osseous structures appear unremarkable. IMPRESSION: Persistent hazy opacity in the left base compatible with atelectasis or pneumonia. Electronically Signed   By: Signa Kell M.D.   On: 09/07/2022 15:07   DG Toe 2nd Left  Result Date: 09/05/2022 CLINICAL DATA:  Right foot wound. EXAM: LEFT SECOND TOE COMPARISON:  January 25, 2022. FINDINGS: There is no evidence of fracture or dislocation. There is no evidence of arthropathy or other focal bone abnormality. Soft tissues are unremarkable. IMPRESSION: Negative. Electronically Signed   By: Lupita Raider M.D.   On: 09/05/2022 12:26    DG Foot 2 Views Right  Result Date: 09/05/2022 CLINICAL DATA:  Midfoot ulcer. EXAM: RIGHT FOOT - 2 VIEW COMPARISON:  09/17/2021 FINDINGS: Two views study shows no definite fracture or dislocation. Advanced arthropathy again noted in the midfoot, similar to prior. No gross bony destruction to suggest osteomyelitis. IMPRESSION: Advanced arthropathy in the midfoot, similar to prior. No gross bony destruction to suggest osteomyelitis. MRI of the foot with and without contrast could be used for more sensitive assessment as clinically warranted. Electronically Signed   By: Kennith Center M.D.   On: 09/05/2022 12:23   DG Chest Portable 1 View  Result Date: 09/05/2022 CLINICAL DATA:  Weakness EXAM: PORTABLE CHEST 1 VIEW COMPARISON:  CXR 01/25/22 FINDINGS: No pleural effusion. No pneumothorax. Unchanged cardiac and mediastinal contours mild cardiomegaly. Hazy opacity at the left lung base is favored represent atelectasis, but infection is not excluded. No radiographically apparent displaced rib fractures. Visualized upper abdomen is unremarkable. IMPRESSION: Cardiomegaly and left basilar atelectasis. Electronically Signed   By: Lorenza Cambridge M.D.   On: 09/05/2022 12:23    Microbiology: Recent Results (from the past 240 hour(s))  Blood Culture (routine x 2)     Status: None   Collection Time: 09/05/22 12:52 PM   Specimen: BLOOD LEFT FOREARM  Result Value Ref Range Status   Specimen Description   Final    BLOOD LEFT FOREARM Performed at [facility], [address], [place], [place] [address]    Special Requests   Final    BOTTLES DRAWN AEROBIC AND ANAEROBIC Blood Culture adequate volume Performed at [facility], [address] [place], [place] [address]    Culture   Final    NO GROWTH 5 DAYS Performed at [facility], [address], [place], [place] [address]    Report Status 09/10/2022 FINAL  Final  Blood Culture (routine x 2)     Status: None   Collection  Time: 09/05/22 12:57 PM   Specimen: BLOOD  Result Value Ref Range Status   Specimen Description   Final    BLOOD BLOOD RIGHT  WRIST Performed at [facility], [address], [place], [place] [address]    Special Requests   Final    BOTTLES DRAWN AEROBIC AND ANAEROBIC Blood Culture adequate volume Performed at [facility], [address], [place], [place] [address]    Culture   Final    NO GROWTH 5 DAYS Performed at [facility], [address], [place], [place] [address]    Report Status 09/10/2022 FINAL  Final  SARS Coronavirus 2 by RT PCR (hospital order, performed in [facility] hospital lab) *cepheid single result test* Anterior Nasal Swab     Status: None   Collection Time: 09/05/22  2:19 PM   Specimen: Anterior Nasal Swab  Result Value Ref Range Status   SARS Coronavirus 2 by RT PCR NEGATIVE NEGATIVE Final    Comment: (NOTE) SARS-CoV-2 target nucleic acids are NOT DETECTED.  The SARS-CoV-2 RNA is generally detectable in upper and lower respiratory specimens during the acute phase of infection. The lowest concentration of SARS-CoV-2 viral copies this assay can detect is 250 copies / mL. A negative result does not preclude SARS-CoV-2 infection and should not be used as the sole basis for treatment or other patient management decisions.  A negative result may occur with improper specimen collection / handling, submission of specimen other than nasopharyngeal swab, presence of viral mutation(s) within the areas targeted by this assay, and inadequate number of viral copies (<250 copies / mL). A negative result must be combined with clinical observations, patient history, and epidemiological information.  Fact Sheet for Patients:   RoadLapTop.co.za  Fact Sheet for Healthcare Providers: http://kim-miller.com/  This test is not yet approved or  cleared by the [place] FDA and has  been authorized for detection and/or diagnosis of SARS-CoV-2 by FDA under an Emergency Use Authorization (EUA).  This EUA will remain in effect (meaning this test can be used) for the duration of the COVID-19 declaration under Section 564(b)(1) of the Act, 21 U.S.C. section 360bbb-3(b)(1), unless the authorization is terminated or revoked sooner.  Performed at [facility], [address] [place], [place] [address]   Respiratory (~20 pathogens) panel by PCR     Status: None   Collection Time: 09/07/22  3:21 PM   Specimen: Nasopharyngeal Swab; Respiratory  Result Value Ref Range Status   Adenovirus NOT DETECTED NOT DETECTED Final   Coronavirus 229E NOT DETECTED NOT DETECTED Final    Comment: (NOTE) The Coronavirus on the Respiratory Panel, DOES NOT test for the novel  Coronavirus (2019 nCoV)    Coronavirus HKU1 NOT DETECTED NOT DETECTED Final   Coronavirus NL63 NOT DETECTED NOT DETECTED Final   Coronavirus OC43 NOT DETECTED NOT DETECTED Final   Metapneumovirus NOT DETECTED NOT DETECTED Final   Rhinovirus / Enterovirus NOT DETECTED NOT DETECTED Final   Influenza A NOT DETECTED NOT DETECTED Final   Influenza B NOT DETECTED NOT DETECTED Final   Parainfluenza Virus 1 NOT DETECTED NOT DETECTED Final   Parainfluenza Virus 2 NOT DETECTED NOT DETECTED Final   Parainfluenza Virus 3 NOT DETECTED NOT DETECTED Final   Parainfluenza Virus 4 NOT DETECTED NOT DETECTED Final   Respiratory Syncytial Virus NOT DETECTED NOT DETECTED Final   Bordetella pertussis NOT DETECTED NOT DETECTED Final   Bordetella Parapertussis NOT DETECTED NOT DETECTED Final   Chlamydophila pneumoniae NOT DETECTED NOT DETECTED Final   Mycoplasma pneumoniae NOT DETECTED NOT DETECTED Final    Comment: Performed at [facility], [address], [place], [place] [address]  Fungus Culture With Stain     Status: Abnormal   Collection Time: 09/08/22  2:06 PM   Specimen: PATH Digit amputation; Tissue   Result Value Ref Range Status   Fungus Stain Final report (A)  Corrected    Comment: (NOTE) RESULT CALLED AND FAXED TO [facility] ON 09/11/22 AT 1802 BY DAVID D. Performed At: [facility] [address] [place], [place] [id number] Jolene Schimke MD XB:[id number] CORRECTED ON 05/30 AT 2135: PREVIOUSLY REPORTED AS Yeast observed    Fungus (Mycology) Culture PENDING  Incomplete   Fungal Source TISSUE  Final    Comment: Performed at [facility], [address] [place], [place] [address]  Aerobic/Anaerobic Culture w Gram Stain (surgical/deep wound)     Status: None (Preliminary result)   Collection Time: 09/08/22  2:06 PM   Specimen: PATH Digit amputation; Tissue  Result Value Ref Range Status   Specimen Description TISSUE  Final   Special Requests DIGIT  Final   Gram Stain   Final    NO WBC SEEN NO ORGANISMS SEEN Performed at [facility], [address], [place], [place] [address]    Culture   Final    RARE STAPHYLOCOCCUS EPIDERMIDIS Two isolates with different morphologies were identified as the same organism.The most resistant organism was reported. RARE CANDIDA ALBICANS NO ANAEROBES ISOLATED; CULTURE IN PROGRESS FOR 5 DAYS    Report Status PENDING  Incomplete   Organism ID, Bacteria STAPHYLOCOCCUS EPIDERMIDIS  Final      Susceptibility   Staphylococcus epidermidis - MIC*    CIPROFLOXACIN <=0.5 SENSITIVE Sensitive     ERYTHROMYCIN >=8 RESISTANT Resistant     GENTAMICIN <=0.5 SENSITIVE Sensitive     OXACILLIN >=4 RESISTANT Resistant     TETRACYCLINE >=16 RESISTANT Resistant     VANCOMYCIN 1 SENSITIVE Sensitive     TRIMETH/SULFA 160 RESISTANT Resistant     CLINDAMYCIN <=0.25 SENSITIVE Sensitive     RIFAMPIN <=0.5 SENSITIVE Sensitive     Inducible Clindamycin NEGATIVE Sensitive     * RARE STAPHYLOCOCCUS EPIDERMIDIS  Fungus Culture Result     Status: Abnormal   Collection Time: 09/08/22  2:06 PM  Result Value Ref Range Status   Result 1 Yeast observed  (A)  Final    Comment: (NOTE) Performed At: [facility] [address] [place], [place] [id number] Jolene Schimke MD KG:[id number]      Labs: CBC: Recent Labs  Lab 09/07/22 0019 09/07/22 1424 09/08/22 0020 09/09/22 0010 09/10/22 0745  WBC 6.5 5.6 5.1 3.2* 4.6  NEUTROABS 5.8 5.1 4.6 2.6 3.7  HGB 14.3 13.5 12.8* 13.3 13.8  HCT 44.1 40.9 39.8 39.7 40.6  MCV 88.6 86.5 89.0 87.4 86.4  PLT 104* 102* 99* 93* 103*   Basic Metabolic Panel: Recent Labs  Lab 09/08/22 0020 09/09/22 0010 09/10/22 0745 09/12/22 0010 09/12/22 1109 09/13/22 0018  NA 134* 133* 135  --  133* 137  K 3.5 3.5 3.5  --  3.1* 3.2*  CL 104 103 102  --  98 101  CO2 21* 23 23  --  27 28  GLUCOSE 133* 153* 161*  --  229* 137*  BUN 17 14 10   --  10 11  CREATININE 1.28* 1.13 0.99 0.90 0.94 0.85  CALCIUM 7.8* 8.0* 8.2*  --  8.4* 8.7*  MG  --   --   --   --   --  1.7   Liver Function Tests: Recent  Labs  Lab 09/07/22 0019  [name] 38  ALT 33  ALKPHOS 90  BILITOT 1.7*  PROT 5.8*  ALBUMIN 2.7*   No results for input(s): "LIPASE", "AMYLASE" in the last 168 hours. No results for input(s): "AMMONIA" in the last 168 hours. Cardiac Enzymes: No results for input(s): "CKTOTAL", "CKMB", "CKMBINDEX", "TROPONINI" in the last 168 hours. BNP (last 3 results) No results for input(s): "BNP" in the last 8760 hours. CBG: Recent Labs  Lab 09/12/22 1102 09/12/22 1607 09/12/22 2100 09/13/22 0603 09/13/22 1105  GLUCAP 204* 170* 190* 119* 213*    Time spent: 35 minutes  Signed:  Gillis Santa  [facility]  09/13/2022 1:20 PM

## 2022-09-15 ENCOUNTER — Encounter: Payer: Self-pay | Admitting: Podiatry

## 2022-09-15 ENCOUNTER — Ambulatory Visit (INDEPENDENT_AMBULATORY_CARE_PROVIDER_SITE_OTHER): Payer: Medicare PPO | Admitting: Podiatry

## 2022-09-15 VITALS — Ht 68.0 in

## 2022-09-15 DIAGNOSIS — M869 Osteomyelitis, unspecified: Secondary | ICD-10-CM | POA: Diagnosis not present

## 2022-09-15 NOTE — Progress Notes (Signed)
   No chief complaint on file.   Subjective:  Patient presents today status post partial toe amputation of the left second digit performed inpatient on 09/08/2022.  Patient doing well.  He is currently WBAT in a Nurse, learning disability.  Presenting for follow-up.  Dressings have been clean and dry since discharge  Past Medical History:  Diagnosis Date   Arthritis    "ankles, knees" (01/29/2018)   CAD in native artery    cath report from 02/2017 indicates patient had a history of RCA stent >10 years prior to that. At time of that cath he was found to have EF 70%, 20-30% ostial LAD, 30-40% prox LAD, 30-40% Cx, 80-90% Cx beyond OM1, totally occluded RCA with left-to-right collaterals.   Cellulitis 03/03/2022   Diabetes mellitus type 2 in obese    GERD (gastroesophageal reflux disease)    Hyperlipidemia    Hypertension    Morbid obesity (HCC)    Onychomycosis 03/03/2022   OSA on CPAP     Past Surgical History:  Procedure Laterality Date   AMPUTATION Left 09/08/2022   Procedure: AMPUTATION LEFT 2nd TOE;  Surgeon: Edwin Cap, DPM;  Location: [facility] OR;  Service: Podiatry;  Laterality: Left;   CARDIAC CATHETERIZATION  2008   CORONARY ANGIOPLASTY WITH STENT PLACEMENT  1997   JOINT REPLACEMENT     KNEE ARTHROSCOPY Bilateral    "meniscus repairs"   TONSILLECTOMY     TOTAL KNEE ARTHROPLASTY Left 01/29/2018   TOTAL KNEE ARTHROPLASTY Left 01/29/2018   Procedure: LEFT TOTAL KNEE ARTHROPLASTY;  Surgeon: Tarry Kos, MD;  Location: [facility] OR;  Service: Orthopedics;  Laterality: Left;   UVULOPALATOPHARYNGOPLASTY  ~ 1994    Allergies  Allergen Reactions   Metformin Other (See Comments)    Objective/Physical Exam Neurovascular status intact.  Incision well coapted with sutures intact.  Significant movement of the erythema and edema to the toe.  Overall well-healing surgical toe  Assessment: 1. s/p partial toe amputation second left. DOS: 09/08/2022  -Patient evaluated -Dressings  changed.  Clean dry and intact until next visit -Continue WBAT defender offloading boot -Patient has follow-up appointment with surgeon, Dr. [name], on 09/18/2022.   Felecia Shelling, DPM [facility]  Dr. Felecia Shelling, DPM    [address] [place], [place] [address]                Office [phone]  Fax [phone]

## 2022-09-16 LAB — AEROBIC/ANAEROBIC CULTURE W GRAM STAIN (SURGICAL/DEEP WOUND)

## 2022-09-17 DIAGNOSIS — G4733 Obstructive sleep apnea (adult) (pediatric): Secondary | ICD-10-CM | POA: Diagnosis not present

## 2022-09-17 DIAGNOSIS — Z6835 Body mass index (BMI) 35.0-35.9, adult: Secondary | ICD-10-CM | POA: Diagnosis not present

## 2022-09-17 DIAGNOSIS — E1165 Type 2 diabetes mellitus with hyperglycemia: Secondary | ICD-10-CM | POA: Diagnosis not present

## 2022-09-18 ENCOUNTER — Ambulatory Visit (INDEPENDENT_AMBULATORY_CARE_PROVIDER_SITE_OTHER): Payer: Medicare PPO | Admitting: Podiatry

## 2022-09-18 ENCOUNTER — Other Ambulatory Visit: Payer: Self-pay | Admitting: Podiatry

## 2022-09-18 DIAGNOSIS — M869 Osteomyelitis, unspecified: Secondary | ICD-10-CM

## 2022-09-18 NOTE — Progress Notes (Signed)
  Subjective:  Patient ID: [name], male    DOB: 04/18/1955,  MRN: [id number]  Chief Complaint  Patient presents with   Routine Post Op    hospital post op 1 dos 5.28 left 2nd to partial amputation    67 y.o. male returns for post-op check.  He is doing well he feels much better has not had any drainage from the toe  Review of Systems: Negative except as noted in the HPI. Denies N/V/F/Ch.   Objective:  There were no vitals filed for this visit. There is no height or weight on file to calculate BMI. Constitutional Well developed. Well nourished.  Vascular Foot warm and well perfused. Capillary refill normal to all digits.  Calf is soft and supple, no posterior calf or knee pain, negative Homans' sign  Neurologic Normal speech. Oriented to person, place, and time. Epicritic sensation to light touch grossly present bilaterally.  Dermatologic Skin healing well without signs of infection. Skin edges well coapted without signs of infection.  Orthopedic: He has no pain to palpation noted about the surgical site.   Culture results are finalized with staph lugdunensis, Staph epidermidis and rare yeast  Assessment:   1. Osteomyelitis of second toe of left foot (HCC)    Plan:  Patient was evaluated and treated and all questions answered.  S/p foot surgery left -Progressing as expected post-operatively.  Incision is healing very well.  Sutures should be able to be removed in 1 to 2 weeks.  His cultures are finalized, he is completing his ciprofloxacin and should not need further therapy, I do not think the yeast needs to be treated as this culture was taken from the distal tip of the toe that was already resected and we have clean margins.  Appears to be healing well.  May resume regular showering and apply a small bandage over the sutures.   No follow-ups on file.

## 2022-09-25 ENCOUNTER — Ambulatory Visit: Payer: Medicare PPO | Admitting: Podiatry

## 2022-09-25 VITALS — BP 129/71 | Temp 96.3°F

## 2022-09-25 DIAGNOSIS — L84 Corns and callosities: Secondary | ICD-10-CM | POA: Diagnosis not present

## 2022-09-25 DIAGNOSIS — M869 Osteomyelitis, unspecified: Secondary | ICD-10-CM | POA: Diagnosis not present

## 2022-09-25 NOTE — Progress Notes (Signed)
  Subjective:  Patient ID: [name], male    DOB: [birth year],  MRN: [id number]  No chief complaint on file.    67 y.o. male returns for post-op check.  States that he is feeling better.  Denies any drainage or pus.  He is asking to still keep something on the right foot.  Review of Systems: Negative except as noted in the HPI. Denies N/V/F/Ch.   Objective:   Vitals:   09/25/22 0812  BP: 129/71  Temp: (!) 96.3 F (35.7 C)   There is no height or weight on file to calculate BMI. General: AAO x3, NAD  Dermatological: Status post partial second toe amputation left foot with sutures intact without any evidence of dehiscence.  There are some minimal edema present.  There is no drainage or pus or signs of infection.  Preulcerative callus noted on the plantar medial aspect the right foot.  The small dried blood present on the central aspect callus.  No surrounding erythema, ascending cellulitis.  No fluctuation, crepitation, malodor.   Vascular: DP/PT pulses palpable.  There is no pain with calf compression, swelling, warmth, erythema.   Neruologic: Sensation decreased  Musculoskeletal: Significant flatfoot is present.  Hammertoes are present.  Gait: Unassisted, Nonantalgic.    Assessment:   Osteomyelitis left second toe, preulcerative calluses right foot.  Plan:  Patient was evaluated and treated and all questions answered.  S/p foot surgery left -Progressing as expected post-operatively.  Incision is healing very well.  I would feel the sutures today will plan on doing the rest next week.  I do recommend dressing applied.  Per Dr. [name] his cultures are finalized, he is completing his ciprofloxacin and should not need further therapy, I do not think the yeast needs to be treated as this culture was taken from the distal tip of the toe that was already resected and we have clean margins.  Appears to be healing well.  May resume regular showering and apply a small bandage  over the sutures.  Right foot pre-ulcerative callus -Debrided the callus with any complications or bleeding today.  I would still keep her a bandage on this as it is preulcerative and hopefully this will help further protect the area.  Much pain signs or symptoms of infection skin breakdown.  Return in about 1 week (around 10/02/2022) for suture removal .  [name]

## 2022-10-02 ENCOUNTER — Ambulatory Visit (INDEPENDENT_AMBULATORY_CARE_PROVIDER_SITE_OTHER): Payer: Medicare PPO | Admitting: Podiatry

## 2022-10-02 DIAGNOSIS — M869 Osteomyelitis, unspecified: Secondary | ICD-10-CM

## 2022-10-02 DIAGNOSIS — L84 Corns and callosities: Secondary | ICD-10-CM

## 2022-10-04 NOTE — Progress Notes (Signed)
  Subjective:  Patient ID: [name], male    DOB: Oct 08, 1955,  MRN: [id number]  Chief Complaint  Patient presents with   Osteomyelitis of second toe of left foot    Pt stated that things are going okay     67 y.o. male returns for post-op check.  States that he is feeling better.  Presents today for suture removal.  Denies any fevers or chills.  No other concerns today.  Review of Systems: Negative except as noted in the HPI. Denies N/V/F/Ch.   Objective:   There were no vitals filed for this visit.   General: AAO x3, NAD  Dermatological: Status post partial second toe amputation left foot with sutures intact without any evidence of dehiscence.  No edema.  There is no ascending cellulitis.  No ascending size.  No drainage or pus.  No fluctuation, crepitation, malodor.  Preulcerative callus noted on the plantar medial aspect the right foot.  No open lesions noted today.  Area still preulcerative.  There is minimal callus formation of the left plantar medial midfoot without any ulcerations.  No surrounding erythema, ascending cellulitis.  No fluctuation, crepitation, malodor.   Vascular: DP/PT pulses palpable.  There is no pain with calf compression, swelling, warmth, erythema.   Neruologic: Sensation decreased  Musculoskeletal: Significant flatfoot is present.  Hammertoes are present.  Gait: Unassisted, Nonantalgic.    Assessment:   Osteomyelitis left second toe, preulcerative calluses right foot.  Plan:  Patient was evaluated and treated and all questions answered.  S/p foot surgery left -Progressing as expected post-operatively.  With remainder of the sutures today incisions healing well.  Small amount of antibiotic ointment was applied followed by dressing.  Continue daily dressing changes.  Continue with offloading boot.  Elevation.  Limited weightbearing to help facilitate healing. -Continue dressing on the right foot daily -Daily foot inspection, glucose  control -Monitor for any clinical signs or symptoms of infection and directed to call the office immediately should any occur or go to the ER.  Return in about 1 week (around 10/09/2022) for post-op visit.  [name]

## 2022-10-07 DIAGNOSIS — G4733 Obstructive sleep apnea (adult) (pediatric): Secondary | ICD-10-CM | POA: Diagnosis not present

## 2022-10-09 DIAGNOSIS — E785 Hyperlipidemia, unspecified: Secondary | ICD-10-CM | POA: Diagnosis not present

## 2022-10-09 DIAGNOSIS — E1142 Type 2 diabetes mellitus with diabetic polyneuropathy: Secondary | ICD-10-CM | POA: Diagnosis not present

## 2022-10-09 DIAGNOSIS — N529 Male erectile dysfunction, unspecified: Secondary | ICD-10-CM | POA: Diagnosis not present

## 2022-10-09 DIAGNOSIS — K219 Gastro-esophageal reflux disease without esophagitis: Secondary | ICD-10-CM | POA: Diagnosis not present

## 2022-10-09 DIAGNOSIS — R269 Unspecified abnormalities of gait and mobility: Secondary | ICD-10-CM | POA: Diagnosis not present

## 2022-10-09 DIAGNOSIS — M109 Gout, unspecified: Secondary | ICD-10-CM | POA: Diagnosis not present

## 2022-10-09 DIAGNOSIS — G4733 Obstructive sleep apnea (adult) (pediatric): Secondary | ICD-10-CM | POA: Diagnosis not present

## 2022-10-09 DIAGNOSIS — M1990 Unspecified osteoarthritis, unspecified site: Secondary | ICD-10-CM | POA: Diagnosis not present

## 2022-10-09 DIAGNOSIS — K5900 Constipation, unspecified: Secondary | ICD-10-CM | POA: Diagnosis not present

## 2022-10-09 DIAGNOSIS — I10 Essential (primary) hypertension: Secondary | ICD-10-CM | POA: Diagnosis not present

## 2022-10-09 LAB — FUNGAL ORGANISM REFLEX

## 2022-10-09 LAB — FUNGUS CULTURE WITH STAIN

## 2022-10-10 ENCOUNTER — Ambulatory Visit (INDEPENDENT_AMBULATORY_CARE_PROVIDER_SITE_OTHER): Payer: Medicare PPO | Admitting: Podiatry

## 2022-10-10 DIAGNOSIS — M869 Osteomyelitis, unspecified: Secondary | ICD-10-CM | POA: Diagnosis not present

## 2022-10-10 DIAGNOSIS — L600 Ingrowing nail: Secondary | ICD-10-CM

## 2022-10-10 MED ORDER — DOXYCYCLINE HYCLATE 100 MG PO TABS
100.0000 mg | ORAL_TABLET | Freq: Two times a day (BID) | ORAL | 0 refills | Status: DC
Start: 2022-10-10 — End: 2022-10-20

## 2022-10-10 NOTE — Patient Instructions (Signed)
Soak Instructions- HAVE SOMEONE ELSE CHECK THE TEMPERATURE OF THE WATER    THE DAY AFTER THE PROCEDURE  Place 1/4 cup of epsom salts in a quart of warm tap water.  Submerge your foot or feet with outer bandage intact for the initial soak; this will allow the bandage to become moist and wet for easy lift off.  Once you remove your bandage, continue to soak in the solution for 20 minutes.  This soak should be done twice a day.  Next, remove your foot or feet from solution, blot dry the affected area and cover.  You may use a band aid large enough to cover the area or use gauze and tape.  Apply other medications to the area as directed by the doctor such as polysporin neosporin.  IF YOUR SKIN BECOMES IRRITATED WHILE USING THESE INSTRUCTIONS, IT IS OKAY TO SWITCH TO  WHITE VINEGAR AND WATER. Or you may use antibacterial soap and water to keep the toe clean  Monitor for any signs/symptoms of infection. Call the office immediately if any occur or go directly to the emergency room. Call with any questions/concerns.    

## 2022-10-14 DIAGNOSIS — Z6835 Body mass index (BMI) 35.0-35.9, adult: Secondary | ICD-10-CM | POA: Diagnosis not present

## 2022-10-14 DIAGNOSIS — G4733 Obstructive sleep apnea (adult) (pediatric): Secondary | ICD-10-CM | POA: Diagnosis not present

## 2022-10-14 DIAGNOSIS — E1165 Type 2 diabetes mellitus with hyperglycemia: Secondary | ICD-10-CM | POA: Diagnosis not present

## 2022-10-17 DIAGNOSIS — G4733 Obstructive sleep apnea (adult) (pediatric): Secondary | ICD-10-CM | POA: Diagnosis not present

## 2022-10-19 ENCOUNTER — Other Ambulatory Visit: Payer: Self-pay | Admitting: Podiatry

## 2022-10-19 ENCOUNTER — Encounter: Payer: Self-pay | Admitting: Podiatry

## 2022-10-20 ENCOUNTER — Ambulatory Visit (INDEPENDENT_AMBULATORY_CARE_PROVIDER_SITE_OTHER): Payer: Medicare PPO

## 2022-10-20 ENCOUNTER — Telehealth: Payer: Self-pay | Admitting: Podiatry

## 2022-10-20 ENCOUNTER — Ambulatory Visit (INDEPENDENT_AMBULATORY_CARE_PROVIDER_SITE_OTHER): Payer: Medicare PPO | Admitting: Podiatry

## 2022-10-20 ENCOUNTER — Encounter: Payer: Self-pay | Admitting: Podiatry

## 2022-10-20 ENCOUNTER — Other Ambulatory Visit: Payer: Self-pay | Admitting: Podiatry

## 2022-10-20 VITALS — BP 143/79 | HR 56 | Temp 95.9°F

## 2022-10-20 DIAGNOSIS — M869 Osteomyelitis, unspecified: Secondary | ICD-10-CM

## 2022-10-20 DIAGNOSIS — L600 Ingrowing nail: Secondary | ICD-10-CM

## 2022-10-20 DIAGNOSIS — L97412 Non-pressure chronic ulcer of right heel and midfoot with fat layer exposed: Secondary | ICD-10-CM | POA: Diagnosis not present

## 2022-10-20 DIAGNOSIS — G4733 Obstructive sleep apnea (adult) (pediatric): Secondary | ICD-10-CM | POA: Diagnosis not present

## 2022-10-20 MED ORDER — PREGABALIN 150 MG PO CAPS: 150.0000 mg | ORAL_CAPSULE | Freq: Two times a day (BID) | ORAL | 0 refills | Status: DC

## 2022-10-20 MED ORDER — DOXYCYCLINE HYCLATE 100 MG PO TABS
100.0000 mg | ORAL_TABLET | Freq: Two times a day (BID) | ORAL | 0 refills | Status: DC
Start: 2022-10-20 — End: 2022-10-20

## 2022-10-20 MED ORDER — CIPROFLOXACIN HCL 500 MG PO TABS
500.0000 mg | ORAL_TABLET | Freq: Two times a day (BID) | ORAL | 0 refills | Status: DC
Start: 2022-10-20 — End: 2022-11-24

## 2022-10-20 MED ORDER — CLINDAMYCIN HCL 300 MG PO CAPS
300.0000 mg | ORAL_CAPSULE | Freq: Three times a day (TID) | ORAL | 0 refills | Status: DC
Start: 2022-10-20 — End: 2022-11-24

## 2022-10-20 NOTE — Telephone Encounter (Signed)
Pt is needing refill on his pregablin please.

## 2022-10-20 NOTE — Progress Notes (Signed)
  Subjective:  Patient ID: [name], male    DOB: April 26, 1955,  MRN: [id number]  Chief Complaint  Patient presents with   Osteomyelitis of second toe of left foot     67 y.o. male returns for post-op check.  States that the left second toe has been healing well and he has no issues with this.  He has noticed some swelling, redness of his right second toenail just this morning.  No drainage or pus.  No injuries that he reports.   Objective:   There were no vitals filed for this visit.   General: AAO x3, NAD  Dermatological: Status post partial second toe amputation left foot with sutures intact without any evidence of dehiscence.  This appears to have healed.  There are some slight erythema but there is no erythema or warmth.  No drainage or pus.  They are preulcerative callus, wound in the plantar aspect of the left foot is resolved.  On the right foot today there is localized edema and erythema present on the proximal nail fold of the right second toe there is no drainage or pus noted today.  Preulcerative callus on the plantar medial aspect of the foot without any underlying ulceration drainage or signs of infection.  Vascular: DP/PT pulses palpable.  There is no pain with calf compression, swelling, warmth, erythema.   Neruologic: Sensation decreased  Musculoskeletal: Significant flatfoot is present.  Hammertoes are present.  Gait: Unassisted, Nonantalgic.    Assessment:   Osteomyelitis left second toe, preulcerative calluses right foot.  Plan:  Patient was evaluated and treated and all questions answered.  S/p foot surgery left -Incisions healing well.  Discussed she can gradually transition to regular foot as tolerated.  Discussed daily foot inspection.    Right 2nd toe infection -Infection I recommended nail removal as this encompassed around the toenail. -At this time, recommended total nail removal without chemical matricectomy to the right second toenail due to  infection. Risks and complications were discussed with the patient for which they understand and  verbally consent to the procedure. Under sterile conditions a total of 3 mL of a mixture of 2% lidocaine plain and 0.5% Marcaine plain was infiltrated in a hallux block fashion. Once anesthetized, the skin was prepped in sterile fashion. A tourniquet was then applied. Next the right second nail was removed in total making sure to remove the entire offending nail border. Once the nail was  Removed, the area was debrided and the underlying skin was intact. The area was irrigated and hemostasis was obtained.  A dry sterile dressing was applied. After application of the dressing the tourniquet was removed and there is found to be an immediate capillary refill time to the digit. The patient tolerated the procedure well any complications. Post procedure instructions were discussed the patient for which he verbally understood. Follow-up in one week for nail check or sooner if any problems are to arise. Discussed signs/symptoms of worsening infection and directed to call the office immediately should any occur or go directly to the emergency room. In the meantime, encouraged to call the office with any questions, concerns, changes symptoms. -Doxycyline  [name]

## 2022-10-20 NOTE — Progress Notes (Signed)
Patient messaged in via MyChart, concerned that he may have infection in the surgical foot.  Foot is red and swollen, as seen in the photos he sent.  Will renew his oral doxycycline, have him monitor his oral temp for fever, and have him call for appt with Dr. Ardelle Anton and ask to be put on cancellation list.

## 2022-10-23 ENCOUNTER — Ambulatory Visit: Payer: Medicare PPO | Admitting: Podiatry

## 2022-10-23 ENCOUNTER — Encounter: Payer: Self-pay | Admitting: Podiatry

## 2022-10-23 DIAGNOSIS — M869 Osteomyelitis, unspecified: Secondary | ICD-10-CM | POA: Diagnosis not present

## 2022-10-23 DIAGNOSIS — L600 Ingrowing nail: Secondary | ICD-10-CM

## 2022-10-23 DIAGNOSIS — L97412 Non-pressure chronic ulcer of right heel and midfoot with fat layer exposed: Secondary | ICD-10-CM

## 2022-10-24 LAB — WOUND CULTURE
MICRO NUMBER:: 15170606
SPECIMEN QUALITY:: ADEQUATE

## 2022-10-25 NOTE — Progress Notes (Signed)
  Subjective:  Patient ID: [name], male    DOB: 10-Apr-1956,  MRN: [id number]  Chief Complaint  Patient presents with   Foot Ulcer    Plantar/medial right - noticed last night foot was very red and swollen and there was some bleeding and drainage coming from the wound, finished most recent antibiotic last night      67 y.o. male presents with above concerns.  He states that the wound on the bottom of the foot is doing better.  Still occasional bloody drainage but there is no pus.  No increase in swelling or redness.  The second toe seems to be doing well bilaterally.  He has new concerns of possible ingrown toenail on the right big toe she just noticed this morning that it was red and swollen and had bleeding.  No pus.  No red streaks.  No fevers or chills.    Objective:   There were no vitals filed for this visit.   General: AAO x3, NAD  Dermatological: Status post partial second toe amputation left foot which is well-healed at this time.  On the right second toe scab is present on the nailbed and there is some trace edema but there is no erythema or warmth there is no drainage or pus.  On the plantar medial aspect of the foot there is a hyperkeratotic lesion with dried blood and upon debridement there is still a granular wound present without any probing to bone or tunneling.  There is no fluctuation, crepitation, malodor.  On the right hallux toenail with incurvation of the nail bed there is localized edema and some faint erythema but there is no ascending cellulitis.  There is no drainage or pus.  Vascular: DP/PT pulses palpable.  There is no pain with calf compression, swelling, warmth, erythema.   Neruologic: Sensation decreased  Musculoskeletal: Significant flatfoot is present.  Hammertoes are present.  Gait: Unassisted, Nonantalgic.    Assessment:   Osteomyelitis left second toe, preulcerative calluses right foot.  Plan:  Patient was evaluated and treated and all  questions answered.  S/p foot surgery left -Incisions healing well.  Continue regular shoe as tolerated.  Right 2nd toe infection -Infection I recommended nail removal as this encompassed around the toenail. -Appears to be healing well.  Recommend antibiotic ointment and a bandage Band-Aid.  Leave the area open at nighttime.  3 signs infection or reoccurrence.  Right midfoot ulcer -Medically necessary wound debridement performed today.  I sharply debrided the wound utilizing #312 with scalpel to debride nonviable, devitalized tissue to promote wound healing.  Debrided wound down to healthy, granular tissue.  I was not able to measure the wound prior to debridement.  After debridement the wound measured approximately 1.5 x 1 x 0.2 cm.  There is no probing, undermining or tunneling.  Wound base is 100% granular.  Silvadene was applied followed by dressing.  Continue daily dressing changes, offloading. -Infection much improved.  Continue to monitor.  Finish course of antibiotics.  Ingrown toenail right hallux -Sharply debrided the corner of the nail with any complications.  Minimal bleeding.  There is no purulence.  Slant back procedure was performed.  I cleaned the area of alcohol and was applied followed by dressing.  Continue with dressing changes.  Finish course of antibiotics.  [name]

## 2022-10-27 ENCOUNTER — Encounter: Payer: Self-pay | Admitting: Podiatry

## 2022-10-28 ENCOUNTER — Ambulatory Visit: Payer: Medicare PPO | Admitting: Podiatry

## 2022-10-29 NOTE — Progress Notes (Signed)
  Subjective:  Patient ID: [name], male    DOB: Sep 04, 1955,  MRN: [id number]  Chief Complaint  Patient presents with   Foot Ulcer    Follow up ulcer plantar right   "Its about the same, maybe a little better"   Toe Pain    Hallux right - lateral border-tender 2nd toe right - previous nail removal?, dark and scabbed      67 y.o. male presents with above concerns.  States that he still has a scab on the tip of the right second toe but no drainage or pus.  Area of the wound on the plantar aspect of the foot is about the same but no swelling redness or any drainage.  Get some bleeding in the mornings when he first gets up.  No pus.  No fevers or chills.    Objective:   There were no vitals filed for this visit.  General: AAO x3, NAD  Dermatological: Status post partial second toe amputation left foot which is well-healed at this time.  Scab present distal aspect of right second toe without any drainage or pus.  No ascending cellulitis.  No fluctuation, crepitation, malodor.  No signs of infection today.  Hyperkeratotic lesion with central granular wound present on the plantar medial aspect of the right midfoot.  There is no surrounding erythema, ascending size but there is no drainage or pus.  No blood pressure, crepitation, or malodor. The incision on the left 2nd toe has healed. No open lesions on the left.   Vascular: DP/PT pulses palpable.  There is no pain with calf compression, swelling, warmth, erythema.   Neruologic: Sensation decreased  Musculoskeletal: Significant flatfoot is present.  Hammertoes are present.  Gait: Unassisted, Nonantalgic.    Assessment:   Osteomyelitis left second toe, preulcerative calluses right foot.  Plan:  Patient was evaluated and treated and all questions answered.  S/p foot surgery left -Incisions healing well.  Continue regular shoe as tolerated. -He will  Right 2nd toe infection -Continue antibiotic ointment and a bandage  Band-Aid.  Leave the area open at nighttime.  3 signs infection or reoccurrence.  Right midfoot ulcer -Medically necessary wound debridement performed today.  I sharply debrided the wound utilizing #312 with scalpel to debride nonviable, devitalized tissue to promote wound healing.  Debrided wound down to healthy, granular tissue.  I was not able to measure the wound prior to debridement.  After debridement the wound measured approximately 1.2 x 1 x 0.2 cm.  There is no probing, undermining or tunneling.  Wound base is 100% granular.  Silvadene was applied followed by dressing.  Continue daily dressing changes, offloading. -Infection much improved.  Continue to monitor.  Finish course of antibiotics.  Ingrown toenail right hallux -Sharply debrided the corner of the nail with any complications.  Appears to be improving.  Apply, dressing changes daily.  [name]

## 2022-10-31 ENCOUNTER — Ambulatory Visit: Payer: Medicare PPO | Admitting: Podiatry

## 2022-10-31 DIAGNOSIS — M2042 Other hammer toe(s) (acquired), left foot: Secondary | ICD-10-CM

## 2022-10-31 DIAGNOSIS — L97412 Non-pressure chronic ulcer of right heel and midfoot with fat layer exposed: Secondary | ICD-10-CM | POA: Diagnosis not present

## 2022-10-31 NOTE — Progress Notes (Signed)
  Subjective:  Patient ID: [name], male    DOB: 15-Jan-1956,  MRN: [id number]  Chief Complaint  Patient presents with   Foot Ulcer    Pt states he feels the ulcer on his left foot is getting better but not the one on the right. Pt also says the ingrown is doing much better.     67 y.o. male presents with above concerns.  He presents today for paroxysmal Beavers left third toe as he continues to have blood spot, catheter tip of the toe.  He states otherwise has been doing well.  Still gets a wound on the bottom of his right foot which doing better.  No redness or swelling.  No fevers or chills.      Objective:   There were no vitals filed for this visit.   General: AAO x3, NAD  Dermatological: Left second toe is well-healed.  There is no significant callus or preulcerative callus on the plantar medial aspect the left foot.  The right foot plantar medial there is noted to be hyperkeratotic lesion is still superficial granular wound noted there appears to be improvement in size.  There is no surrounding erythema, ascending cellulitis.  No change in pulse.  No fluctuation, crepitus, or malodor.  Preulcerative lesion noted to left third toe distally.  Small scab in the distal aspect right second toe but no change approximate signs infection.  Vascular: DP/PT pulses palpable.  There is no pain with calf compression, swelling, warmth, erythema.   Neruologic: Sensation decreased  Musculoskeletal: Significant flatfoot is present.  Hammertoes are present; proximal left third toe hammertoe  Gait: Unassisted, Nonantalgic.    Assessment:   Status post left second toe amputation which is healed; hammertoe left third toe with preulcerative callus; ulcer right foot  Plan:  Patient was evaluated and treated and all questions answered.  S/p foot surgery left -Incisions healing well.  Continue regular shoe as tolerated. -He will  Right 2nd toe infection -Continue antibiotic ointment  and a bandage Band-Aid.  Leave the area open at nighttime.  3 signs infection or reoccurrence.  Right midfoot ulcer -Medically necessary wound debridement performed today.  I sharply debrided the wound utilizing #312 with scalpel to debride nonviable, devitalized tissue to promote wound healing.  Debrided wound down to healthy, granular tissue.  I was not able to measure the wound prior to debridement.  After debridement the wound measured approximately 1 x 1 x 0.2 cm.  There is no probing, undermining or tunneling.  Wound base is 100% granular.  Silvadene was applied followed by dressing.  Continue daily dressing changes, offloading. -Infection much improved.  Continue to monitor.  Finish course of antibiotics.  Left third toe preulcerative callus, hammertoe -We discussed flexor tenotomy.  Discussed the risks and complications understands and wishes to proceed.  I cleaned the skin with alcohol initially 3 mL of lidocaine, Marcaine plain was infiltrated in a digital block fashion.  Once anesthetized I prepped the toe with Betadine.  I then introduced an 18-gauge needle to the plantar aspect of the digit to transect the flexor tendon.  Send the talus and into rectus position.  I irrigated with alcohol.  Pain is applied.  Postprocedure instructions discussed.  He has doxycycline at home to take in case of any signs of infection.  [name]    [name]

## 2022-11-11 DIAGNOSIS — E1165 Type 2 diabetes mellitus with hyperglycemia: Secondary | ICD-10-CM | POA: Diagnosis not present

## 2022-11-11 DIAGNOSIS — G4733 Obstructive sleep apnea (adult) (pediatric): Secondary | ICD-10-CM | POA: Diagnosis not present

## 2022-11-11 DIAGNOSIS — Z6836 Body mass index (BMI) 36.0-36.9, adult: Secondary | ICD-10-CM | POA: Diagnosis not present

## 2022-11-13 ENCOUNTER — Other Ambulatory Visit: Payer: Self-pay | Admitting: Podiatry

## 2022-11-17 ENCOUNTER — Ambulatory Visit (INDEPENDENT_AMBULATORY_CARE_PROVIDER_SITE_OTHER): Payer: Medicare PPO | Admitting: Podiatry

## 2022-11-17 DIAGNOSIS — L97412 Non-pressure chronic ulcer of right heel and midfoot with fat layer exposed: Secondary | ICD-10-CM

## 2022-11-17 DIAGNOSIS — G4733 Obstructive sleep apnea (adult) (pediatric): Secondary | ICD-10-CM | POA: Diagnosis not present

## 2022-11-17 DIAGNOSIS — M2042 Other hammer toe(s) (acquired), left foot: Secondary | ICD-10-CM

## 2022-11-21 NOTE — Progress Notes (Signed)
  Subjective:  Patient ID: [name], male    DOB: 09/10/1955,  MRN: [id number]  Chief Complaint  Patient presents with   Foot Ulcer      67 y.o. male presents with above concerns.  States he is doing well.  Still has a wound on the right foot otherwise the right foot is been doing well.  The flexor tenotomy is done well he has a scab to the distal aspect left second toe but no drainage or pus reports.  No fevers or chills.  No other concerns today reports.      Objective:   There were no vitals filed for this visit.   General: AAO x3, NAD  Dermatological: Left second toe is well-healed.  At the distal aspect of the left third toe there is dried callus with blood underneath this.  Upon debridement.  Superficial area of skin breakdown but there is no drainage or pus or any signs of infection.  No edema, erythema.  There is no malodor.  On the plantar medial aspect of the right foot hyperkeratotic lesion is still superficial granular wound present but appears to be healing.  There is no probing, and or tunneling.  There is no surrounding erythema, ascending cellulitis.  No fluctuation or crepitation there is no malodor.  No new ulcerations present.    Vascular: DP/PT pulses palpable.  There is no pain with calf compression, swelling, warmth, erythema.   Neruologic: Sensation decreased  Musculoskeletal: Significant flatfoot is present.  Hammertoes are present; proximal left third toe hammertoe  Gait: Unassisted, Nonantalgic.    Assessment:   Status post left second toe amputation which is healed; hammertoe left third toe with preulcerative callus; ulcer right foot  Plan:  Patient was evaluated and treated and all questions answered.   Right midfoot ulcer -Medically necessary wound debridement performed today.  I sharply debrided the wound utilizing #312 with scalpel to debride nonviable, devitalized tissue to promote wound healing.  Debrided wound down to healthy, granular  tissue.  I was not able to measure the wound prior to debridement.  After debridement the wound measured approximately 0.6 x 0.5 x 0.1 cm.  There is no probing, undermining or tunneling.  Wound base is 100% granular.  Silvadene was applied followed by dressing.  Continue daily dressing changes, offloading. -Infection much improved.  Continue to monitor.  Finish course of antibiotics.  Left third toe preulcerative callus, hammertoe -Sharply debrided the hyperkeratotic lesion with any complications there is still small superficial wound present.  No drainage or pus.  Monitoring signs or symptoms of infection.  [name]

## 2022-11-24 ENCOUNTER — Encounter: Payer: Self-pay | Admitting: Cardiovascular Disease

## 2022-11-24 ENCOUNTER — Ambulatory Visit: Payer: Medicare PPO | Attending: Cardiovascular Disease | Admitting: Cardiovascular Disease

## 2022-11-24 VITALS — BP 122/80 | HR 66 | Ht 68.0 in | Wt 240.6 lb

## 2022-11-24 DIAGNOSIS — L089 Local infection of the skin and subcutaneous tissue, unspecified: Secondary | ICD-10-CM

## 2022-11-24 DIAGNOSIS — E11628 Type 2 diabetes mellitus with other skin complications: Secondary | ICD-10-CM | POA: Diagnosis not present

## 2022-11-24 DIAGNOSIS — E1159 Type 2 diabetes mellitus with other circulatory complications: Secondary | ICD-10-CM | POA: Diagnosis not present

## 2022-11-24 MED ORDER — NITROGLYCERIN 0.4 MG SL SUBL: 0.4000 mg | SUBLINGUAL_TABLET | SUBLINGUAL | 3 refills | Status: AC | PRN

## 2022-11-24 MED ORDER — EZETIMIBE 10 MG PO TABS: 10.0000 mg | ORAL_TABLET | Freq: Every day | ORAL | 3 refills | Status: DC

## 2022-11-24 NOTE — Progress Notes (Signed)
Cardiology Office Note:  .   Date:  11/24/2022  ID:  [name], DOB 1955-08-25, MRN [id number] PCP: Emilio Aspen, MD  [facility] Providers Cardiologist:  Thurmon Fair, MD    History of Present Illness: .   [name] is a 67 y.o. male with a longstanding history of CAD (right coronary stent 1997, chronic total occlusion by catheterization 2008, 80-90% stenosis of small distal left circumflex), type 2 diabetes mellitus complicated by neuropathy and right Charcot foot arthropathy, mixed hyperlipidemia, hypertension, OSA on CPAP, morbid obesity, recently hospitalized with osteomyelitis of the left foot complicated by sepsis and demand myocardial infarction, had amputation of left second toe.  He has recovered well from the acute illness.  He has not had any more problems with fever or chills and the left foot ulceration is almost completely sealed.  He has a new ulcer on the sole of the right foot and is wearing an orthopedic boot.  Unfortunately this limits his ability to walk.  He is not able to swim either, due to the open wound.  He is using the NuStep at home 30 minutes a day every day of the week.  He denies problems shortness of breath or chest discomfort at rest or with activity.  He does not have orthopnea, PND, edema or claudication.  Previous nuclear stress testing has shown low risk findings (inferior wall scar without meaningful areas of ischemia on scans in 2019 and 2022).  His cardiac catheterization in 2008 showed good left-to-right collaterals.  Following his episode of demand infarction in May 2020 4 repeat echocardiography showed normal regional wall motion and unchanged EF calculated at 57%.  The right ventricle was described as moderately enlarged with normal systolic function and the right atrium was moderately dilated.  ROS: Denies daytime hypersomnolence.  Reports compliance with CPAP.  Studies Reviewed: Marland Kitchen       ECG is not ordered today.  Personally  reviewed the electrocardiogram from 05/29/2024Which shows sinus rhythm, incomplete RBBB, left axis deviation due to left anterior fascicular block, no acute repolarization abnormalities, QTc 426 ms  Echocardiogram 09/08/2022    1. Left ventricular ejection fraction, by estimation, is 55 to 60%. Left  ventricular ejection fraction by PLAX is 57 %. The left ventricle has  normal function. The left ventricle has no regional wall motion  abnormalities. Left ventricular diastolic  parameters were normal.   2. Right ventricular systolic function is normal. The right ventricular  size is moderately enlarged. Tricuspid regurgitation signal is inadequate  for assessing PA pressure.   3. Right atrial size was moderately dilated.   4. The mitral valve is grossly normal. Trivial mitral valve  regurgitation.   5. The aortic valve is tricuspid. Aortic valve regurgitation is not  visualized. No aortic stenosis is present.   6. The inferior vena cava is dilated in size with <50% respiratory  variability, suggesting right atrial pressure of 15 mmHg.   Nuclear stress test 12/13/2020     Findings are consistent with prior myocardial infarction. The study is overall low risk.   No ST deviation was noted.   LV perfusion is abnormal. There is no evidence of ischemia. There is evidence of infarction. Defect 1: There is a medium defect with mild reduction in uptake present in the apical to basal inferior location(s) that is fixed. There is abnormal wall motion in the defect area. Consistent with infarction.   Left ventricular function is normal. Nuclear stress EF: 56 %. The  left ventricular ejection fraction is normal (55-65%). End diastolic cavity size is normal.   Prior study available for comparison from 12/22/2017. No changes in perfusion on side by side comparison to prior study.   Lipid profile 12/13/2021 Cholesterol 171, HDL 42, LDL 81, triglycerides 294 44/04/270 Hemoglobin A1c  7.5% 09/13/2022 Hemoglobin 13.8, creatinine 0.5, potassium 3.2 Risk Assessment/Calculations:             Physical Exam:   VS:  BP 122/80 (BP Location: Left Arm, Patient Position: Sitting, Cuff Size: Large)   Pulse 66   Ht 5\' 8"  (1.727 m)   Wt 240 lb 9.6 oz (109.1 kg)   SpO2 94%   BMI 36.58 kg/m    Wt Readings from Last 3 Encounters:  11/24/22 240 lb 9.6 oz (109.1 kg)  09/05/22 248 lb 0.3 oz (112.5 kg)  03/03/22 257 lb (116.6 kg)    GEN: Well nourished, well developed in no acute distress NECK: No JVD; No carotid bruits CARDIAC: RRR, no murmurs, rubs, gallops RESPIRATORY:  Clear to auscultation without rales, wheezing or rhonchi  ABDOMEN: Soft, non-tender, non-distended EXTREMITIES:  No edema; No deformity   ASSESSMENT AND PLAN: .   CAD: Asymptomatic.  His previous angina was a sensation of severe retrosternal heaviness with activity.  He has not experienced that in years, including not at the time of his demand infarction during sepsis in May 2024.  Has a known chronic total occlusion of the right coronary artery and could well have myocardium at risk in the distal left circumflex coronary artery distribution (80-90% stenosis, too small for PCI).  The focus is on risk factor mitigation.  On aspirin, beta-blocker, statin as well as long-acting nitrates. DM: Fair but not perfect glycemic control.  He has recently started back on Ozempic.  Was intolerant of Mounjaro due to GI side effects.  He is also on Jardiance.  He is not on insulin. HLP: LDL is not at target (should be less than 70, prefer less than 55), HDL was borderline acceptable at 42 and he has mild hypertriglyceridemia despite taking maximum dose atorvastatin and colesevelam.  Add Zetia 10 mg daily.  He will have repeat lipid profile in about a month with his PCP. Diabetic foot ulcer/osteomyelitis: Suspicion for PAD.  Will check lower extremity arterial Dopplers. HTN: Very well-controlled. OSA: Probably explains enlargement  of right heart chambers on echocardiogram, but he had normal RV systolic function.  PA pressure could not be estimated.  Reports 100% compliance with CPAP.  Weight loss would be highly beneficial.  Hopefully GLP-1 agonist will help.       Dispo: Check lower extremity arterial Dopplers, start Zetia 10 mg once daily, labs with PCP next month, follow-up in 1 year.  Signed, Thurmon Fair, MD

## 2022-11-24 NOTE — Patient Instructions (Signed)
Medication Instructions:  Zetia 10 mg every morning *If you need a refill on your cardiac medications before your next appointment, please call your pharmacy*   Lab Work: Get lipid panel with your PCP If you have labs (blood work) drawn today and your tests are completely normal, you will receive your results only by: MyChart Message (if you have MyChart) OR A paper copy in the mail If you have any lab test that is abnormal or we need to change your treatment, we will call you to review the results.   Testing/Procedures: Your physician has requested that you have an ankle brachial index (ABI). During this test an ultrasound and blood pressure cuff are used to evaluate the arteries that supply the arms and legs with blood. Allow thirty minutes for this exam. There are no restrictions or special instructions.   Your physician has requested that you have a lower extremity arterial exercise duplex. During this test, exercise and ultrasound are used to evaluate arterial blood flow in the legs. Allow one hour for this exam. There are no restrictions or special instructions.    Follow-Up: At [facility], you and your health needs are our priority.  As part of our continuing mission to provide you with exceptional heart care, we have created designated Provider Care Teams.  These Care Teams include your primary Cardiologist (physician) and Advanced Practice Providers (APPs -  Physician Assistants and Nurse Practitioners) who all work together to provide you with the care you need, when you need it.  We recommend signing up for the patient portal called "MyChart".  Sign up information is provided on this After Visit Summary.  MyChart is used to connect with patients for Virtual Visits (Telemedicine).  Patients are able to view lab/test results, encounter notes, upcoming appointments, etc.  Non-urgent messages can be sent to your provider as well.   To learn more about what you can do with  MyChart, go to [email or web address].    Your next appointment:   1 year(s)  Provider:   Thurmon Fair, MD

## 2022-12-01 ENCOUNTER — Ambulatory Visit: Payer: Medicare PPO | Admitting: Podiatry

## 2022-12-01 DIAGNOSIS — L97412 Non-pressure chronic ulcer of right heel and midfoot with fat layer exposed: Secondary | ICD-10-CM

## 2022-12-03 NOTE — Progress Notes (Signed)
  Subjective:  Patient ID: [name], male    DOB: 14-May-1955,  MRN: [id number]  Chief Complaint  Patient presents with   Foot Ulcer     67 y.o. male presents with above concerns.  Left foot is been doing well.  Still has a wound on the bottom of the right foot but no purulence or increasing redness.  Denies any fevers or chills.  He has no other concerns today.   Objective:   General: AAO x3, NAD-present send foot defender boot right foot  Dermatological: Left second toe is well-healed.  At the distal aspect there is a small amount of scabbing present.  Upon debridement the wound is healed.  There is no skin breakdown on the left side today.  The area in the plantar medial aspect left foot is healed there is no ulceration.  At the distal aspect right second toe with swelling a scab in the nails been removed but this is coming off.  No edema, erythema.  On the plantar medial aspect of the foot there is still superficial area skin breakdown still noted and gradually improving.  There is no surrounding erythema, ascending cellulitis.  No fluctuation or crepitation.  There is no malodor.  Vascular: DP/PT pulses palpable.  There is no pain with calf compression, swelling, warmth, erythema.   Neruologic: Sensation decreased  Musculoskeletal: Significant flatfoot is present.  Hammertoes are present; proximal left third toe hammertoe  Gait: Unassisted, Nonantalgic.    Assessment:   Status post left second toe amputation which is healed; hammertoe left third toe with preulcerative callus; ulcer right foot  Plan:  Patient was evaluated and treated and all questions answered.   Right midfoot ulcer -Medically necessary wound debridement performed today.  I sharply debrided the wound utilizing #312 with scalpel to debride nonviable, devitalized tissue to promote wound healing.  Debrided wound down to healthy, granular tissue.  I was not able to measure the wound prior to debridement.   After debridement the wound measured approximately 0.3 x 0.4 x 0.1 cm.  There is no probing, undermining or tunneling.  Wound base is 100% granular.  Silvadene was applied followed by dressing.  Continue daily dressing changes, offloading. -Continue foot defender boot.  Left third toe preulcerative callus, hammertoe -Sharply debrided the hyperkeratotic lesion with any complications and appears to have healed.  [name]

## 2022-12-08 ENCOUNTER — Other Ambulatory Visit: Payer: Self-pay | Admitting: *Deleted

## 2022-12-08 MED ORDER — ISOSORBIDE MONONITRATE ER 120 MG PO TB24: ORAL_TABLET | ORAL | 3 refills | Status: DC

## 2022-12-08 MED ORDER — METOPROLOL SUCCINATE ER 100 MG PO TB24: 100.0000 mg | ORAL_TABLET | Freq: Every day | ORAL | 3 refills | Status: DC

## 2022-12-08 MED ORDER — EMPAGLIFLOZIN 25 MG PO TABS: 25.0000 mg | ORAL_TABLET | Freq: Every day | ORAL | 3 refills | Status: DC

## 2022-12-09 DIAGNOSIS — G4733 Obstructive sleep apnea (adult) (pediatric): Secondary | ICD-10-CM | POA: Diagnosis not present

## 2022-12-09 DIAGNOSIS — Z6836 Body mass index (BMI) 36.0-36.9, adult: Secondary | ICD-10-CM | POA: Diagnosis not present

## 2022-12-09 DIAGNOSIS — E1165 Type 2 diabetes mellitus with hyperglycemia: Secondary | ICD-10-CM | POA: Diagnosis not present

## 2022-12-11 ENCOUNTER — Ambulatory Visit (HOSPITAL_COMMUNITY)
Admission: RE | Admit: 2022-12-11 | Discharge: 2022-12-11 | Disposition: A | Discharge status: Home / Self Care | Payer: Medicare PPO | Source: Ambulatory Visit | Attending: Cardiology | Admitting: Cardiology

## 2022-12-11 DIAGNOSIS — E11628 Type 2 diabetes mellitus with other skin complications: Secondary | ICD-10-CM | POA: Insufficient documentation

## 2022-12-11 DIAGNOSIS — L089 Local infection of the skin and subcutaneous tissue, unspecified: Secondary | ICD-10-CM | POA: Diagnosis not present

## 2022-12-11 DIAGNOSIS — E1159 Type 2 diabetes mellitus with other circulatory complications: Secondary | ICD-10-CM | POA: Diagnosis not present

## 2022-12-12 LAB — VAS US ABI WITH/WO TBI
Left ABI: 1.17
Right ABI: 1.23

## 2022-12-18 ENCOUNTER — Ambulatory Visit (INDEPENDENT_AMBULATORY_CARE_PROVIDER_SITE_OTHER): Payer: Medicare PPO

## 2022-12-18 ENCOUNTER — Ambulatory Visit: Payer: Medicare PPO

## 2022-12-18 ENCOUNTER — Ambulatory Visit: Payer: Medicare PPO | Admitting: Podiatry

## 2022-12-18 DIAGNOSIS — G4733 Obstructive sleep apnea (adult) (pediatric): Secondary | ICD-10-CM | POA: Diagnosis not present

## 2022-12-18 DIAGNOSIS — L97412 Non-pressure chronic ulcer of right heel and midfoot with fat layer exposed: Secondary | ICD-10-CM

## 2022-12-18 NOTE — Progress Notes (Signed)
Offload made to surgical shoe to would area / Charcot bony area  Patient states it feels much better will try out and call if there are any issues  I added full foot plate crepe at 1/8" cut out area of charcot wound then added 1/4" plastizote to top of peg assist insert to greater offload area   [employer], CFo, CFm

## 2022-12-22 NOTE — Progress Notes (Signed)
  Subjective:  Patient ID: BARBARA CALCANO, male    DOB: 19-Aug-1955,  MRN: [id number]  Chief Complaint  Patient presents with   Wound Check    Ulcer of right foot midfoot.      67 y.o. male presents with above concerns.  States it still bleeds daily but no pus.  No increased swelling or redness.  No fevers or chills that he reports.  He has no new concerns.   Objective:   General: AAO x3, NAD-present send foot defender boot right foot  Dermatological: Left second toe is well-healed.  On the plantar medial aspect of the right foot hyperkeratotic lesion with granular ulceration still present.  There is no probing, and or tunneling.  There is no fluctuation or crepitation.  There is no malodor.  No obvious signs of infection.  Vascular: DP/PT pulses palpable.  There is no pain with calf compression, swelling, warmth, erythema.   Neruologic: Sensation decreased  Musculoskeletal: Significant flatfoot is present.  Hammertoes are present; proximal left third toe hammertoe  Gait: Unassisted, Nonantalgic.    Assessment:   Significant flatfoot with chronic ulceration right foot  Plan:  Patient was evaluated and treated and all questions answered.   Right midfoot ulcer -Medically necessary wound debridement performed today.  I sharply debrided the wound utilizing #312 with scalpel to debride nonviable, devitalized tissue to promote wound healing.  Debrided wound down to healthy, granular tissue.  I was not able to measure the wound prior to debridement.   Silvadene was applied followed by dressing.  Continue daily dressing changes, offloading. -Continue foot defender boot.  Unfortunate given his significant flatfoot and prominence this is difficult wound to heal.  Also been referred to the wound care center today.  [name]

## 2022-12-23 DIAGNOSIS — E1121 Type 2 diabetes mellitus with diabetic nephropathy: Secondary | ICD-10-CM | POA: Diagnosis not present

## 2022-12-23 DIAGNOSIS — E11621 Type 2 diabetes mellitus with foot ulcer: Secondary | ICD-10-CM | POA: Diagnosis not present

## 2022-12-23 DIAGNOSIS — Z0000 Encounter for general adult medical examination without abnormal findings: Secondary | ICD-10-CM | POA: Diagnosis not present

## 2022-12-23 DIAGNOSIS — Z125 Encounter for screening for malignant neoplasm of prostate: Secondary | ICD-10-CM | POA: Diagnosis not present

## 2022-12-23 DIAGNOSIS — K909 Intestinal malabsorption, unspecified: Secondary | ICD-10-CM | POA: Diagnosis not present

## 2022-12-23 DIAGNOSIS — E7800 Pure hypercholesterolemia, unspecified: Secondary | ICD-10-CM | POA: Diagnosis not present

## 2022-12-23 DIAGNOSIS — Z89422 Acquired absence of other left toe(s): Secondary | ICD-10-CM | POA: Diagnosis not present

## 2022-12-23 DIAGNOSIS — Z79899 Other long term (current) drug therapy: Secondary | ICD-10-CM | POA: Diagnosis not present

## 2022-12-23 DIAGNOSIS — L98499 Non-pressure chronic ulcer of skin of other sites with unspecified severity: Secondary | ICD-10-CM | POA: Diagnosis not present

## 2022-12-23 DIAGNOSIS — I25119 Atherosclerotic heart disease of native coronary artery with unspecified angina pectoris: Secondary | ICD-10-CM | POA: Diagnosis not present

## 2022-12-23 DIAGNOSIS — E1165 Type 2 diabetes mellitus with hyperglycemia: Secondary | ICD-10-CM | POA: Diagnosis not present

## 2022-12-23 DIAGNOSIS — Z1331 Encounter for screening for depression: Secondary | ICD-10-CM | POA: Diagnosis not present

## 2022-12-23 DIAGNOSIS — I10 Essential (primary) hypertension: Secondary | ICD-10-CM | POA: Diagnosis not present

## 2022-12-23 LAB — LAB REPORT - SCANNED
A1c: 6.5
Albumin, Urine POC: 8.02
Albumin/Creatinine Ratio, Urine, POC: 92.7
Creatinine, POC: 87 mg/dL
EGFR: 80

## 2022-12-30 ENCOUNTER — Encounter (HOSPITAL_BASED_OUTPATIENT_CLINIC_OR_DEPARTMENT_OTHER): Payer: Medicare PPO | Attending: Internal Medicine | Admitting: Internal Medicine

## 2022-12-30 DIAGNOSIS — L97518 Non-pressure chronic ulcer of other part of right foot with other specified severity: Secondary | ICD-10-CM | POA: Diagnosis not present

## 2022-12-30 DIAGNOSIS — G4730 Sleep apnea, unspecified: Secondary | ICD-10-CM | POA: Insufficient documentation

## 2022-12-30 DIAGNOSIS — L97512 Non-pressure chronic ulcer of other part of right foot with fat layer exposed: Secondary | ICD-10-CM | POA: Diagnosis not present

## 2022-12-30 DIAGNOSIS — E11610 Type 2 diabetes mellitus with diabetic neuropathic arthropathy: Secondary | ICD-10-CM | POA: Insufficient documentation

## 2022-12-30 DIAGNOSIS — E11621 Type 2 diabetes mellitus with foot ulcer: Secondary | ICD-10-CM | POA: Diagnosis not present

## 2022-12-30 DIAGNOSIS — I10 Essential (primary) hypertension: Secondary | ICD-10-CM | POA: Insufficient documentation

## 2022-12-30 DIAGNOSIS — E1140 Type 2 diabetes mellitus with diabetic neuropathy, unspecified: Secondary | ICD-10-CM | POA: Insufficient documentation

## 2023-01-01 ENCOUNTER — Ambulatory Visit: Payer: Medicare PPO | Admitting: Podiatry

## 2023-01-01 DIAGNOSIS — L97412 Non-pressure chronic ulcer of right heel and midfoot with fat layer exposed: Secondary | ICD-10-CM

## 2023-01-01 NOTE — Progress Notes (Signed)
wheelchair 0 No Weak (short steps with or without shuffle, stooped but able to lift head while walking, may seek 0 No support from furniture) Impaired (short steps with shuffle, may have difficulty arising from chair, head down, impaired 0 No balance) Mental Status Oriented to own ability 0 No Electronic Signature(s) Signed: 01/01/2023 4:20:28 PM By: Brenton Grills Entered By: Brenton Grills on 12/30/2022 12:49:03 -------------------------------------------------------------------------------- Foot Assessment Details Patient Name: Date of Service: [name], [name] 12/30/2022 12:30 PM Medical Record Number: [id number] Patient Account Number: [id number] Date of Birth/Sex: Treating RN: 16-Jun-1955 (67 y.o. Yates Decamp Primary Care Kaytie Ratcliffe: [name] Clinician: Referring Rindi Beechy: Treating Shaune Malacara/Extender: Nevin Bloodgood in Treatment: 0 Foot Assessment Items Site Locations + = Sensation present, - = Sensation absent, C = Callus, U = Ulcer R = Redness, W = Warmth, M = Maceration, PU = Pre-ulcerative lesion F = Fissure, S = Swelling, D = Dryness Assessment Right: Left: Other Deformity: No No Prior Foot Ulcer:  No No Prior Amputation: No No Charcot Joint: No No Ambulatory Status: Ambulatory Without Help GaitKEMONTE, [name] ([id number]) [id number] Page 4 of 4 Electronic Signature(s) Signed: 01/01/2023 4:20:28 PM By: Brenton Grills Entered By: Brenton Grills on 12/30/2022 12:49:32 -------------------------------------------------------------------------------- Nutrition Risk Screening Details Patient Name: Date of Service: [name], [name] 12/30/2022 12:30 PM Medical Record Number: [id number] Patient Account Number: [id number] Date of Birth/Sex: Treating RN: [birth year] (67 y.o. Yates Decamp Primary Care Liberty Stead: [name] Clinician: Referring Arti Trang: Treating Wolfe Camarena/Extender: Nevin Bloodgood in Treatment: 0 Height (in): Weight (lbs): Body Mass Index (BMI): Nutrition Risk Screening Items Score Screening NUTRITION RISK SCREEN: I have an illness or condition that made me change the kind and/or amount of food I eat 0 No I eat fewer than two meals per day 0 No I eat few fruits and vegetables, or milk products 0 No I have three or more drinks of beer, liquor or wine almost every day 0 No I have tooth or mouth problems that make it hard for me to eat 0 No I don't always have enough money to buy the food I need 0 No I eat alone most of the time 0 No I take three or more different prescribed or over-the-counter drugs a day 0 No Without wanting to, I have lost or gained 10 pounds in the last six months 0 No I am not always physically able to shop, cook and/or feed myself 0 No Nutrition Protocols Good Risk Protocol Moderate Risk Protocol High Risk Proctocol Risk Level: Good Risk Score: 0 Electronic Signature(s) Signed: 01/01/2023 4:20:28 PM By: Brenton Grills Entered By: Brenton Grills on 12/30/2022 12:49:10  [name], [name] ([id number]) [id number] Page 1 of 4 Visit Report for 12/30/2022 Abuse Risk Screen Details Patient Name: Date of Service: [name], [name] 12/30/2022 12:30 PM Medical Record Number: [id number] Patient Account Number: [id number] Date of Birth/Sex: Treating RN: Jul 30, 1955 (67 y.o. Yates Decamp Primary Care Hendy Brindle: [name] Clinician: Referring Naketa Daddario: Treating Penina Reisner/Extender: Nevin Bloodgood in Treatment: 0 Abuse Risk Screen Items Answer ABUSE RISK SCREEN: Has anyone close to you tried to hurt or harm you recentlyo No Do you feel uncomfortable with anyone in your familyo No Has anyone forced you do things that you didnt want to doo No Electronic Signature(s) Signed: 01/01/2023 4:20:28 PM By: Brenton Grills Entered By: Brenton Grills on 12/30/2022 12:47:52 -------------------------------------------------------------------------------- Activities of Daily Living Details Patient Name: Date of Service: [name], [name] 12/30/2022 12:30 PM Medical Record Number: [id number] Patient Account Number: [id number] Date of Birth/Sex: Treating RN: 07-26-1955 (67 y.o. Yates Decamp Primary Care Bettye Sitton: [name] Clinician: Referring Venisha Boehning: Treating Eola Waldrep/Extender: Nevin Bloodgood in Treatment: 0 Activities of Daily Living Items Answer Activities of Daily Living (Please select one for each item) Drive Automobile Completely Able T Medications ake Completely Able Use T elephone Completely Able Care for Appearance Completely Able Use T oilet Completely Able Bath / Shower Completely Able Dress Self Completely Able Feed Self Completely Able Walk Completely Able Get In / Out Bed Completely Able Housework Completely Able Prepare Meals Completely Able Handle Money Completely Able Shop for Self Completely Able Electronic Signature(s) Signed:  01/01/2023 4:20:28 PM By: Brenton Grills Entered By: Brenton Grills on 12/30/2022 12:48:19 [name] ([id number]) [id number] Page 2 of 4 -------------------------------------------------------------------------------- Education Screening Details Patient Name: Date of Service: [name], [name] 12/30/2022 12:30 PM Medical Record Number: [id number] Patient Account Number: [id number] Date of Birth/Sex: Treating RN: Aug 31, 1955 (67 y.o. Yates Decamp Primary Care Artemisa Sladek: [name] Clinician: Referring Adien Kimmel: Treating Elisabel Hanover/Extender: Nevin Bloodgood in Treatment: 0 Learning Preferences/Education Level/Primary Language Highest Education Level: High School Preferred Language: English Cognitive Barrier Language Barrier: No Translator Needed: No Memory Deficit: No Emotional Barrier: No Cultural/Religious Beliefs Affecting Medical Care: No Physical Barrier Impaired Vision: Yes Glasses Impaired Hearing: No Decreased Hand dexterity: No Knowledge/Comprehension Knowledge Level: High Comprehension Level: High Ability to understand written instructions: High Ability to understand verbal instructions: High Motivation Anxiety Level: Calm Cooperation: Cooperative Education Importance: Acknowledges Need Interest in Health Problems: Asks Questions Perception: Coherent Willingness to Engage in Self-Management High Activities: Readiness to Engage in Self-Management High Activities: Electronic Signature(s) Signed: 01/01/2023 4:20:28 PM By: Brenton Grills Entered By: Brenton Grills on 12/30/2022 12:48:56 -------------------------------------------------------------------------------- Fall Risk Assessment Details Patient Name: Date of Service: [name]. 12/30/2022 12:30 PM Medical Record Number: [id number] Patient Account Number: [id number] Date of Birth/Sex: Treating RN: 1955-12-13 (67 y.o. Yates Decamp Primary Care Dystany Duffy: [name] Clinician: Referring Nichoals Heyde: Treating Donterrius Santucci/Extender: Nevin Bloodgood in Treatment: 0 Fall Risk Assessment Items Have you had 2 or more falls in the last 12 monthso 0 No Have you had any fall that resulted in injury in the last 12 monthso 0 No [address] [name], [name] ([id number]) [phone] [id number] Page 3 of 4 History of falling - immediate or within 3 months 0 No Secondary diagnosis (Do you have 2 or more medical diagnoseso) 0 No Ambulatory aid None/bed rest/wheelchair/nurse 0 No Crutches/cane/walker 0 No Furniture 0 No Intravenous therapy Access/Saline/Heparin Lock 0 No Gait/Transferring Normal/ bed rest/

## 2023-01-01 NOTE — Progress Notes (Signed)
MD Patient Name: Provider: 1955-08-01 [id number] Date of Birth: NPI#: [name] [id number] Sex: DEA #: [phone] [id number] Phone #: License #: [id number] UPN: Patient Address: [address] Eligha Bridegroom [facility] [place], [place] [address] [address] [place], [place] [address] [phone] Allergies metformin; penicillin Provider's Orders naerobe culture - Culture of right plantar for wound for potential infection. Bacteria identified in Unspecified specimen by A LOINC Code: 635-3 Convenience Name: Anaerobic culture Hand Signature: Date(s): Electronic Signature(s) Signed: 12/30/2022 4:34:08 PM By: Baltazar Najjar MD Signed: 01/01/2023 4:20:28 PM By: Brenton Grills Entered By: Brenton Grills on 12/30/2022  13:37:30 [name] ([id number]) [id number] Page 5 of 9 -------------------------------------------------------------------------------- Problem List Details Patient Name: Date of Service: [name], [name] 12/30/2022 12:30 PM Medical Record Number: [id number] Patient Account Number: [id number] Date of Birth/Sex: Treating RN: 1956/01/10 (67 y.o. M) Primary Care Provider: [name] Clinician: Referring Provider: Treating Provider/Extender: Nevin Bloodgood in Treatment: 0 Active Problems ICD-10 Encounter Code Description Active Date MDM Diagnosis E11.621 Type 2 diabetes mellitus with foot ulcer 12/30/2022 No Yes L97.518 Non-pressure chronic ulcer of other part of right foot with other specified 12/30/2022 No Yes severity M14.671 Charcot's joint, right ankle and foot 12/30/2022 No Yes Inactive Problems Resolved Problems Electronic Signature(s) Signed: 12/30/2022 4:34:08 PM By: Baltazar Najjar MD Entered By: Baltazar Najjar on 12/30/2022 13:48:31 -------------------------------------------------------------------------------- Progress Note Details Patient Name: Date of Service: [name]. 12/30/2022 12:30 PM Medical Record Number: [id number] Patient Account Number: [id number] Date of Birth/Sex: Treating RN: Feb 27, 1956 (67 y.o. M) Primary Care Provider: [name] Clinician: Referring Provider: Treating Provider/Extender: Nevin Bloodgood in Treatment: 0 Subjective Chief Complaint Information obtained from Patient 12/30/2022; patient is here for review of wound on his right medial plantar foot. Sent over by Dr. Fransisco Beau of podiatry History of Present Illness (HPI) ADMISSION 12/30/2022 Mr. Fansler is a 67 year old man with type 2 diabetes and a Charcot foot on the right. He has a history of bilateral foot wounds cared for by [facility]. He has also had osteomyelitis  with a left second toe amputation on 09/08/2022. He tells me that he is been dealing with a wound on his plantar foot for the last month to 2. He has been using Silvadene with surrounding AandD. More recently Iodoflex and silver alginate. He was in a Psychologist, forensic however more recently has changed to a well-padded surgical shoe. He thinks since this change was made that things are actually better. He is retired, not tremendously active but not sedentary either. DANIE, MAGNONE K ([id number]) [id number] Page 6 of 9 Past medical history type 2 diabetes with Charcot deformity, osteomyelitis of left second toe status post amputation ABIs at 1.23 on the left and 1.17 on the right. Also noted that he appears to have had normal arterial studies done by Dr. Kirke Corin in August we will need to check these next time. I do not believe he has significant arterial disease Patient History Information obtained from Chart. Allergies metformin, penicillin Family History Heart Disease - Mother,Father. Social History Never smoker, Marital Status - Widowed, Alcohol Use - Rarely, Drug Use - No History, Caffeine Use - Never. Medical History Respiratory Patient has history of Sleep Apnea Cardiovascular Patient has history of Angina, Coronary Artery Disease, Hypertension, Peripheral Venous Disease Endocrine Patient has history of Type II Diabetes Neurologic Patient has history of Neuropathy Hospitalization/Surgery History - left amp 2nd toe. - TKA, left. - cardiac cath. - joint replacement,  [name], Levi K. 12/30/2022 12:30 PM Medical Record Number: [id number] Patient Account Number: [id number] Date of Birth/Sex: Treating RN: Jul 29, 1955 (67 y.o. Yates Decamp Primary Care Provider: [name] Clinician: Referring Provider: Treating Provider/Extender: Nevin Bloodgood in Treatment: 0 Procedure Performed for: Wound #1 Right,Medial,Plantar Foot Performed By: Physician Maxwell Caul., MD The following information was scribed by: Brenton Grills The information was scribed for: Baltazar Najjar Post Procedure Diagnosis Same as Pre-procedure [name], MCNERNEY K ([id number]) [id number] Page 3 of 9 Electronic Signature(s) Signed: 12/30/2022 4:34:08 PM By: Baltazar Najjar MD Signed: 01/01/2023 4:20:28 PM By: Brenton Grills Entered By: Brenton Grills on 12/30/2022 13:29:53 -------------------------------------------------------------------------------- Physical Exam Details Patient Name: Date of Service: [name], [name] 12/30/2022 12:30 PM Medical Record Number: [id number] Patient Account Number: [id number] Date of Birth/Sex: Treating RN: [birth year] (67 y.o. M) Primary Care Provider: [name] Clinician: Referring Provider: Treating Provider/Extender: Nevin Bloodgood in Treatment: 0 Constitutional Sitting or standing  Blood Pressure is within target range for patient.. Pulse regular and within target range for patient.Marland Kitchen Respirations regular, non-labored and within target range.. Temperature is normal and within the target range for the patient.Marland Kitchen Appears in no distress. Cardiovascular Pedal pulses are palpable on the right. Integumentary (Hair, Skin) Charcot deformity of the right foot and ankle pes planus deformity on the right. Notes Wound exam; the area in question is on the medial part of the right foot. Thick callus around a small open area. I used a #5 curette to remove the callus some subcutaneous debris down to a healthier granulated surface. Hemostasis with silver nitrate and direct pressure. This does not probe to bone. There is no evidence of surrounding infection Electronic Signature(s) Signed: 12/30/2022 4:34:08 PM By: Baltazar Najjar MD Entered By: Baltazar Najjar on 12/30/2022 13:54:13 -------------------------------------------------------------------------------- Physician Orders Details Patient Name: Date of Service: [name], Levi 12/30/2022 12:30 PM Medical Record Number: [id number] Patient Account Number: [id number] Date of Birth/Sex: Treating RN: 04-Aug-1955 (67 y.o. Yates Decamp Primary Care Provider: [name] Clinician: Referring Provider: Treating Provider/Extender: Nevin Bloodgood in Treatment: 0 Verbal / Phone Orders: No Diagnosis Coding Follow-up Appointments ppointment in 1 week. - Please make appointment. Return A Anesthetic (In clinic) Topical Lidocaine 4% applied to wound bed Bathing/ Shower/ Hygiene May shower with protection but do not get wound dressing(s) wet. Protect dressing(s) with water repellant cover (for example, large plastic bag) or a cast cover and may then take shower. Off-Loading Wound #1 Right,Medial,Plantar Foot Other: - Alleviate as much pressure to the area as possible. ZAO, MASOOD K ([id number])  [id number] Page 4 of 9 Wound Treatment Wound #1 - Foot Wound Laterality: Plantar, Right, Medial Cleanser: Vashe 5.8 (oz) Discharge Instructions: Cleanse the wound with Vashe prior to applying a clean dressing using gauze sponges, not tissue or cotton balls. Prim Dressing: Maxorb Extra Ag+ Alginate Dressing, 4x4.75 (in/in) ary Discharge Instructions: Apply to wound bed as instructed Secured With: Kerlix Roll Sterile, 4.5x3.1 (in/yd) Discharge Instructions: Secure with Kerlix as directed. Secured With: 42M Medipore H Soft Cloth Surgical T ape, 4 x 10 (in/yd) Discharge Instructions: Secure with tape as directed. Laboratory naerobe culture (MICRO) - Culture of right plantar for wound for potential infection. Bacteria identified in Unspecified specimen by A LOINC Code: 635-3 Convenience Name: Anaerobic culture Electronic Signature(s) Signed: 12/30/2022 4:34:08 PM By: Baltazar Najjar MD Signed: 01/01/2023 4:20:28 PM By: Brenton Grills Entered By: Brenton Grills on 12/30/2022 13:37:29 Prescription 12/30/2022 -------------------------------------------------------------------------------- Charlean Sanfilippo K. Baltazar Najjar  much pressure to the area as possible. Laboratory ordered were: Anaerobic culture - Culture of right plantar for wound for potential infection. WOUND #1: - Foot Wound Laterality: Plantar, Right, Medial Cleanser: Vashe  5.8 (oz) Discharge Instructions: Cleanse the wound with Vashe prior to applying a clean dressing using gauze sponges, not tissue or cotton balls. Prim Dressing: Maxorb Extra Ag+ Alginate Dressing, 4x4.75 (in/in) ary Discharge Instructions: Apply to wound bed as instructed Secured With: Kerlix Roll Sterile, 4.5x3.1 (in/yd) Discharge Instructions: Secure with Kerlix as directed. Secured With: 14M Medipore H Soft Cloth Surgical T ape, 4 x 10 (in/yd) Discharge Instructions: Secure with tape as directed. 1. I think the major culprit in the nonhealing state for this patient is the unrelieved pressure. They had a defender boot up until recently now a well-padded surgical shoe. Will have to see if the latter makes any difference. Ultimately however I think the patient is going to require a total contact cast and I talked to him about this today. He has a follow-up with Dr. Fransisco Beau on Thursday I urged him to talk about this. He could have that done at the podiatry office I am fine with that otherwise we will see him back in a week 2. He apparently has had a recent x-ray. Postdebridement I did culture the wound surface but no empiric antibiotics. If this is negative all the more reason to proceed with a total contact cast 3. He tells me that he was wearing crocs before this wound started. He is likely did not need custom made diabetic shoes with insoles and we talked about this as well. Culture is pending for CandS we will see him back in a week's time provided he elects to be followed here Electronic Signature(s) Signed: 12/30/2022 5:50:35 PM By: Shawn Stall RN, BSN Signed: 01/01/2023 4:42:34 PM By: Baltazar Najjar MD Previous Signature: 12/30/2022 4:34:08 PM Version By: Baltazar Najjar MD Entered By: Shawn Stall on 12/30/2022 17:46:44 -------------------------------------------------------------------------------- HxROS Details Patient Name: Date of Service: LORI, DONKOR 12/30/2022 12:30  PM Medical Record Number: [id number] Patient Account Number: [id number] Date of Birth/Sex: Treating RN: 25-Sep-1955 (67 y.o. Grayland, Banuelos, Duayne K ([id number]) [id number] Page 8 of 9 Primary Care Provider: [name] Clinician: Referring Provider: Treating Provider/Extender: Nevin Bloodgood in Treatment: 0 Information Obtained From Chart Integumentary (Skin) Complaints and Symptoms: Positive for: Wounds - foot ulcer Respiratory Medical History: Positive for: Sleep Apnea Cardiovascular Medical History: Positive for: Angina; Coronary Artery Disease; Hypertension; Peripheral Venous Disease Gastrointestinal Medical History: Past Medical History Notes: GERD Endocrine Medical History: Positive for: Type II Diabetes Neurologic Medical History: Positive for: Neuropathy Immunizations Pneumococcal Vaccine: Received Pneumococcal Vaccination: No Implantable Devices No devices added Hospitalization / Surgery History Type of Hospitalization/Surgery left amp 2nd toe TKA, left cardiac cath joint replacement, left knee tonsillectomy Uvulopalatopharyngoplasty Family and Social History Heart Disease: Yes - Mother,Father; Never smoker; Marital Status - Widowed; Alcohol Use: Rarely; Drug Use: No History; Caffeine Use: Never; Financial Concerns: No; Food, Clothing or Shelter Needs: No; Support System Lacking: No; Transportation Concerns: No Electronic Signature(s) Signed: 12/30/2022 4:34:08 PM By: Baltazar Najjar MD Signed: 01/01/2023 4:20:28 PM By: Brenton Grills Previous Signature: 12/30/2022 8:12:05 AM Version By: Baltazar Najjar MD Entered By: Brenton Grills on 12/30/2022 12:47:44 SuperBill Details -------------------------------------------------------------------------------- [name] ([id number]) [id number] Page 9 of 9 Patient Name: Date of Service: OATHER, [name]  12/30/2022 Medical Record Number: [id number] Patient Account Number: [id number] Date of Birth/Sex: Treating RN: [birth year] (67 y.o. M)  [name], Levi K. 12/30/2022 12:30 PM Medical Record Number: [id number] Patient Account Number: [id number] Date of Birth/Sex: Treating RN: Jul 29, 1955 (67 y.o. Yates Decamp Primary Care Provider: [name] Clinician: Referring Provider: Treating Provider/Extender: Nevin Bloodgood in Treatment: 0 Procedure Performed for: Wound #1 Right,Medial,Plantar Foot Performed By: Physician Maxwell Caul., MD The following information was scribed by: Brenton Grills The information was scribed for: Baltazar Najjar Post Procedure Diagnosis Same as Pre-procedure [name], MCNERNEY K ([id number]) [id number] Page 3 of 9 Electronic Signature(s) Signed: 12/30/2022 4:34:08 PM By: Baltazar Najjar MD Signed: 01/01/2023 4:20:28 PM By: Brenton Grills Entered By: Brenton Grills on 12/30/2022 13:29:53 -------------------------------------------------------------------------------- Physical Exam Details Patient Name: Date of Service: [name], [name] 12/30/2022 12:30 PM Medical Record Number: [id number] Patient Account Number: [id number] Date of Birth/Sex: Treating RN: [birth year] (67 y.o. M) Primary Care Provider: [name] Clinician: Referring Provider: Treating Provider/Extender: Nevin Bloodgood in Treatment: 0 Constitutional Sitting or standing  Blood Pressure is within target range for patient.. Pulse regular and within target range for patient.Marland Kitchen Respirations regular, non-labored and within target range.. Temperature is normal and within the target range for the patient.Marland Kitchen Appears in no distress. Cardiovascular Pedal pulses are palpable on the right. Integumentary (Hair, Skin) Charcot deformity of the right foot and ankle pes planus deformity on the right. Notes Wound exam; the area in question is on the medial part of the right foot. Thick callus around a small open area. I used a #5 curette to remove the callus some subcutaneous debris down to a healthier granulated surface. Hemostasis with silver nitrate and direct pressure. This does not probe to bone. There is no evidence of surrounding infection Electronic Signature(s) Signed: 12/30/2022 4:34:08 PM By: Baltazar Najjar MD Entered By: Baltazar Najjar on 12/30/2022 13:54:13 -------------------------------------------------------------------------------- Physician Orders Details Patient Name: Date of Service: [name], Levi 12/30/2022 12:30 PM Medical Record Number: [id number] Patient Account Number: [id number] Date of Birth/Sex: Treating RN: 04-Aug-1955 (67 y.o. Yates Decamp Primary Care Provider: [name] Clinician: Referring Provider: Treating Provider/Extender: Nevin Bloodgood in Treatment: 0 Verbal / Phone Orders: No Diagnosis Coding Follow-up Appointments ppointment in 1 week. - Please make appointment. Return A Anesthetic (In clinic) Topical Lidocaine 4% applied to wound bed Bathing/ Shower/ Hygiene May shower with protection but do not get wound dressing(s) wet. Protect dressing(s) with water repellant cover (for example, large plastic bag) or a cast cover and may then take shower. Off-Loading Wound #1 Right,Medial,Plantar Foot Other: - Alleviate as much pressure to the area as possible. ZAO, MASOOD K ([id number])  [id number] Page 4 of 9 Wound Treatment Wound #1 - Foot Wound Laterality: Plantar, Right, Medial Cleanser: Vashe 5.8 (oz) Discharge Instructions: Cleanse the wound with Vashe prior to applying a clean dressing using gauze sponges, not tissue or cotton balls. Prim Dressing: Maxorb Extra Ag+ Alginate Dressing, 4x4.75 (in/in) ary Discharge Instructions: Apply to wound bed as instructed Secured With: Kerlix Roll Sterile, 4.5x3.1 (in/yd) Discharge Instructions: Secure with Kerlix as directed. Secured With: 42M Medipore H Soft Cloth Surgical T ape, 4 x 10 (in/yd) Discharge Instructions: Secure with tape as directed. Laboratory naerobe culture (MICRO) - Culture of right plantar for wound for potential infection. Bacteria identified in Unspecified specimen by A LOINC Code: 635-3 Convenience Name: Anaerobic culture Electronic Signature(s) Signed: 12/30/2022 4:34:08 PM By: Baltazar Najjar MD Signed: 01/01/2023 4:20:28 PM By: Brenton Grills Entered By: Brenton Grills on 12/30/2022 13:37:29 Prescription 12/30/2022 -------------------------------------------------------------------------------- Charlean Sanfilippo K. Baltazar Najjar  much pressure to the area as possible. Laboratory ordered were: Anaerobic culture - Culture of right plantar for wound for potential infection. WOUND #1: - Foot Wound Laterality: Plantar, Right, Medial Cleanser: Vashe  5.8 (oz) Discharge Instructions: Cleanse the wound with Vashe prior to applying a clean dressing using gauze sponges, not tissue or cotton balls. Prim Dressing: Maxorb Extra Ag+ Alginate Dressing, 4x4.75 (in/in) ary Discharge Instructions: Apply to wound bed as instructed Secured With: Kerlix Roll Sterile, 4.5x3.1 (in/yd) Discharge Instructions: Secure with Kerlix as directed. Secured With: 14M Medipore H Soft Cloth Surgical T ape, 4 x 10 (in/yd) Discharge Instructions: Secure with tape as directed. 1. I think the major culprit in the nonhealing state for this patient is the unrelieved pressure. They had a defender boot up until recently now a well-padded surgical shoe. Will have to see if the latter makes any difference. Ultimately however I think the patient is going to require a total contact cast and I talked to him about this today. He has a follow-up with Dr. Fransisco Beau on Thursday I urged him to talk about this. He could have that done at the podiatry office I am fine with that otherwise we will see him back in a week 2. He apparently has had a recent x-ray. Postdebridement I did culture the wound surface but no empiric antibiotics. If this is negative all the more reason to proceed with a total contact cast 3. He tells me that he was wearing crocs before this wound started. He is likely did not need custom made diabetic shoes with insoles and we talked about this as well. Culture is pending for CandS we will see him back in a week's time provided he elects to be followed here Electronic Signature(s) Signed: 12/30/2022 5:50:35 PM By: Shawn Stall RN, BSN Signed: 01/01/2023 4:42:34 PM By: Baltazar Najjar MD Previous Signature: 12/30/2022 4:34:08 PM Version By: Baltazar Najjar MD Entered By: Shawn Stall on 12/30/2022 17:46:44 -------------------------------------------------------------------------------- HxROS Details Patient Name: Date of Service: LORI, DONKOR 12/30/2022 12:30  PM Medical Record Number: [id number] Patient Account Number: [id number] Date of Birth/Sex: Treating RN: 25-Sep-1955 (67 y.o. Grayland, Banuelos, Duayne K ([id number]) [id number] Page 8 of 9 Primary Care Provider: [name] Clinician: Referring Provider: Treating Provider/Extender: Nevin Bloodgood in Treatment: 0 Information Obtained From Chart Integumentary (Skin) Complaints and Symptoms: Positive for: Wounds - foot ulcer Respiratory Medical History: Positive for: Sleep Apnea Cardiovascular Medical History: Positive for: Angina; Coronary Artery Disease; Hypertension; Peripheral Venous Disease Gastrointestinal Medical History: Past Medical History Notes: GERD Endocrine Medical History: Positive for: Type II Diabetes Neurologic Medical History: Positive for: Neuropathy Immunizations Pneumococcal Vaccine: Received Pneumococcal Vaccination: No Implantable Devices No devices added Hospitalization / Surgery History Type of Hospitalization/Surgery left amp 2nd toe TKA, left cardiac cath joint replacement, left knee tonsillectomy Uvulopalatopharyngoplasty Family and Social History Heart Disease: Yes - Mother,Father; Never smoker; Marital Status - Widowed; Alcohol Use: Rarely; Drug Use: No History; Caffeine Use: Never; Financial Concerns: No; Food, Clothing or Shelter Needs: No; Support System Lacking: No; Transportation Concerns: No Electronic Signature(s) Signed: 12/30/2022 4:34:08 PM By: Baltazar Najjar MD Signed: 01/01/2023 4:20:28 PM By: Brenton Grills Previous Signature: 12/30/2022 8:12:05 AM Version By: Baltazar Najjar MD Entered By: Brenton Grills on 12/30/2022 12:47:44 SuperBill Details -------------------------------------------------------------------------------- [name] ([id number]) [id number] Page 9 of 9 Patient Name: Date of Service: OATHER, [name]  12/30/2022 Medical Record Number: [id number] Patient Account Number: [id number] Date of Birth/Sex: Treating RN: [birth year] (67 y.o. M)  [name], Levi K. 12/30/2022 12:30 PM Medical Record Number: [id number] Patient Account Number: [id number] Date of Birth/Sex: Treating RN: Jul 29, 1955 (67 y.o. Yates Decamp Primary Care Provider: [name] Clinician: Referring Provider: Treating Provider/Extender: Nevin Bloodgood in Treatment: 0 Procedure Performed for: Wound #1 Right,Medial,Plantar Foot Performed By: Physician Maxwell Caul., MD The following information was scribed by: Brenton Grills The information was scribed for: Baltazar Najjar Post Procedure Diagnosis Same as Pre-procedure [name], MCNERNEY K ([id number]) [id number] Page 3 of 9 Electronic Signature(s) Signed: 12/30/2022 4:34:08 PM By: Baltazar Najjar MD Signed: 01/01/2023 4:20:28 PM By: Brenton Grills Entered By: Brenton Grills on 12/30/2022 13:29:53 -------------------------------------------------------------------------------- Physical Exam Details Patient Name: Date of Service: [name], [name] 12/30/2022 12:30 PM Medical Record Number: [id number] Patient Account Number: [id number] Date of Birth/Sex: Treating RN: [birth year] (67 y.o. M) Primary Care Provider: [name] Clinician: Referring Provider: Treating Provider/Extender: Nevin Bloodgood in Treatment: 0 Constitutional Sitting or standing  Blood Pressure is within target range for patient.. Pulse regular and within target range for patient.Marland Kitchen Respirations regular, non-labored and within target range.. Temperature is normal and within the target range for the patient.Marland Kitchen Appears in no distress. Cardiovascular Pedal pulses are palpable on the right. Integumentary (Hair, Skin) Charcot deformity of the right foot and ankle pes planus deformity on the right. Notes Wound exam; the area in question is on the medial part of the right foot. Thick callus around a small open area. I used a #5 curette to remove the callus some subcutaneous debris down to a healthier granulated surface. Hemostasis with silver nitrate and direct pressure. This does not probe to bone. There is no evidence of surrounding infection Electronic Signature(s) Signed: 12/30/2022 4:34:08 PM By: Baltazar Najjar MD Entered By: Baltazar Najjar on 12/30/2022 13:54:13 -------------------------------------------------------------------------------- Physician Orders Details Patient Name: Date of Service: [name], Levi 12/30/2022 12:30 PM Medical Record Number: [id number] Patient Account Number: [id number] Date of Birth/Sex: Treating RN: 04-Aug-1955 (67 y.o. Yates Decamp Primary Care Provider: [name] Clinician: Referring Provider: Treating Provider/Extender: Nevin Bloodgood in Treatment: 0 Verbal / Phone Orders: No Diagnosis Coding Follow-up Appointments ppointment in 1 week. - Please make appointment. Return A Anesthetic (In clinic) Topical Lidocaine 4% applied to wound bed Bathing/ Shower/ Hygiene May shower with protection but do not get wound dressing(s) wet. Protect dressing(s) with water repellant cover (for example, large plastic bag) or a cast cover and may then take shower. Off-Loading Wound #1 Right,Medial,Plantar Foot Other: - Alleviate as much pressure to the area as possible. ZAO, MASOOD K ([id number])  [id number] Page 4 of 9 Wound Treatment Wound #1 - Foot Wound Laterality: Plantar, Right, Medial Cleanser: Vashe 5.8 (oz) Discharge Instructions: Cleanse the wound with Vashe prior to applying a clean dressing using gauze sponges, not tissue or cotton balls. Prim Dressing: Maxorb Extra Ag+ Alginate Dressing, 4x4.75 (in/in) ary Discharge Instructions: Apply to wound bed as instructed Secured With: Kerlix Roll Sterile, 4.5x3.1 (in/yd) Discharge Instructions: Secure with Kerlix as directed. Secured With: 42M Medipore H Soft Cloth Surgical T ape, 4 x 10 (in/yd) Discharge Instructions: Secure with tape as directed. Laboratory naerobe culture (MICRO) - Culture of right plantar for wound for potential infection. Bacteria identified in Unspecified specimen by A LOINC Code: 635-3 Convenience Name: Anaerobic culture Electronic Signature(s) Signed: 12/30/2022 4:34:08 PM By: Baltazar Najjar MD Signed: 01/01/2023 4:20:28 PM By: Brenton Grills Entered By: Brenton Grills on 12/30/2022 13:37:29 Prescription 12/30/2022 -------------------------------------------------------------------------------- Charlean Sanfilippo K. Baltazar Najjar

## 2023-01-01 NOTE — Progress Notes (Signed)
[name], [name] in Treatment: 0 Wound Status Wound Number: 1 Primary Diabetic Wound/Ulcer of the Lower Extremity Etiology: Wound Location: Right, Medial, Plantar Foot Wound Open Wounding Event: Bump Status: Date Acquired: 09/13/2022 Comorbid Sleep Apnea, Angina, Coronary Artery Disease, Hypertension, Weeks Of Treatment: 0 History: Peripheral Venous Disease, Type II Diabetes, Neuropathy Clustered Wound: No Photos Levi, [name] ([id number]) [id number] Page 8 of 9 Wound Measurements Length: (cm) 0.8 Width: (cm) 0.8 Depth: (cm) 0.8 Area: (cm) 0.503 Volume: (cm) 0.402 % Reduction in Area: % Reduction in Volume: Tunneling: No Undermining: No Wound Description Classification: Grade 2 Wound Margin: Distinct, outline attached Exudate Amount: Medium Exudate Type: Serosanguineous Exudate Color: red, brown Foul Odor After Cleansing: No Slough/Fibrino Yes Wound Bed Granulation Amount: Medium (34-66%) Exposed Structure Granulation Quality: Red, Pink Fat Layer (Subcutaneous Tissue) Exposed: Yes Necrotic Amount: Medium (34-66%) Necrotic Quality: Eschar Periwound Skin Texture Texture Color No Abnormalities Noted: No No Abnormalities Noted: No Scarring: Yes Hemosiderin Staining: Yes Moisture Temperature / Pain No Abnormalities Noted: No Temperature: No Abnormality Dry / Scaly: No Maceration: No Treatment Notes Wound #1 (Foot) Wound Laterality: Plantar, Right, Medial Cleanser Vashe 5.8 (oz) Discharge Instruction: Cleanse the wound with Vashe prior to applying a clean dressing using gauze sponges,  not tissue or cotton balls. Peri-Wound Care Topical Primary Dressing Maxorb Extra Ag+ Alginate Dressing, 4x4.75 (in/in) Discharge Instruction: Apply to wound bed as instructed Secondary Dressing Secured With Kerlix Roll Sterile, 4.5x3.1 (in/yd) Discharge Instruction: Secure with Kerlix as directed. 59M Medipore H Soft Cloth Surgical T ape, 4 x 10 (in/yd) Discharge Instruction: Secure with tape as directed. Compression Wrap Compression Stockings Add-Ons Electronic Signature(s) Signed: 01/01/2023 4:20:28 PM By: Brenton Grills Entered By: Brenton Grills on 12/30/2022 13:27:47 -------------------------------------------------------------------------------- Vitals Details Patient Name: Date of Service: [name], [name] 12/30/2022 12:30 PM [name] ([id number]) [id number] Page 9 of 9 Medical Record Number: [id number] Patient Account Number: [id number] Date of Birth/Sex: Treating RN: Mar 19, 1956 (67 y.o. Yates Decamp Primary Care Prophet Renwick: [name] Clinician: Referring Dosia Yodice: Treating Moriah Loughry/Extender: Nevin Bloodgood in Treatment: 0 Vital Signs Time Taken: 12:46 Temperature (F): 98 Pulse (bpm): 68 Respiratory Rate (breaths/min): 18 Blood Pressure (mmHg): 110/77 Capillary Blood Glucose (mg/dl): 595 Reference Range: 80 - 120 mg / dl Electronic Signature(s) Signed: 01/01/2023 4:20:28 PM By: Brenton Grills Entered By: Brenton Grills on 12/30/2022 12:46:53  [name], [name] ([id number]) [id number] Page 1 of 9 Visit Report for 12/30/2022 Allergy List Details Patient Name: Date of Service: [name], [name] 12/30/2022 12:30 PM Medical Record Number: [id number] Patient Account Number: [id number] Date of Birth/Sex: Treating RN: 08/09/1955 (67 y.o. Yates Decamp Primary Care Janelli Welling: [name] Clinician: Referring Sameeha Rockefeller: Treating Shaunice Levitan/Extender: Nevin Bloodgood in Treatment: 0 Allergies Active Allergies metformin penicillin Allergy Notes Electronic Signature(s) Signed: 01/01/2023 4:20:28 PM By: Brenton Grills Entered By: Brenton Grills on 12/30/2022 12:47:07 -------------------------------------------------------------------------------- Arrival Information Details Patient Name: Date of Service: [name], [name] 12/30/2022 12:30 PM Medical Record Number: [id number] Patient Account Number: [id number] Date of Birth/Sex: Treating RN: April 28, 1955 (67 y.o. Yates Decamp Primary Care Charels Stambaugh: [name] Clinician: Referring Traven Davids: Treating Emelie Newsom/Extender: Nevin Bloodgood in Treatment: 0 Visit Information Patient Arrived: Ambulatory Arrival Time: 12:44 Accompanied By: self Transfer Assistance: None Patient Identification Verified: Yes Secondary Verification Process Completed: Yes Patient Requires Transmission-Based Precautions: No Patient Has Alerts: No Electronic Signature(s) Signed: 01/01/2023 4:20:28 PM By: Brenton Grills Entered By: Brenton Grills on 12/30/2022 12:46:14 Clinic Level of Care Assessment Details -------------------------------------------------------------------------------- [name] ([id number]) [id number] Page 2 of 9 Patient Name: Date of Service: [name], [name] 12/30/2022 12:30 PM Medical Record Number: [id number] Patient Account Number: [id number] Date of Birth/Sex:  Treating RN: 11/16/1955 (67 y.o. Yates Decamp Primary Care Chelly Dombeck: [name] Clinician: Referring Hayden Mabin: Treating Hawthorne Day/Extender: Nevin Bloodgood in Treatment: 0 Clinic Level of Care Assessment Items TOOL 1 Quantity Score X- 1 0 Use when EandM and Procedure is performed on INITIAL visit ASSESSMENTS - Nursing Assessment / Reassessment X- 1 20 General Physical Exam (combine w/ comprehensive assessment (listed just below) when performed on new pt. evals) X- 1 25 Comprehensive Assessment (HX, ROS, Risk Assessments, Wounds Hx, etc.) ASSESSMENTS - Wound and Skin Assessment / Reassessment X- 1 10 Dermatologic / Skin Assessment (not related to wound area) ASSESSMENTS - Ostomy and/or Continence Assessment and Care []  - 0 Incontinence Assessment and Management []  - 0 Ostomy Care Assessment and Management (repouching, etc.) PROCESS - Coordination of Care X - Simple Patient / Family Education for ongoing care 1 15 []  - 0 Complex (extensive) Patient / Family Education for ongoing care X- 1 10 Staff obtains Chiropractor, Records, T Results / Process Orders est []  - 0 Staff telephones HHA, Nursing Homes / Clarify orders / etc []  - 0 Routine Transfer to another Facility (non-emergent condition) []  - 0 Routine Hospital Admission (non-emergent condition) X- 1 15 New Admissions / Manufacturing engineer / Ordering NPWT Apligraf, etc. , []  - 0 Emergency Hospital Admission (emergent condition) PROCESS - Special Needs []  - 0 Pediatric / Minor Patient Management []  - 0 Isolation Patient Management []  - 0 Hearing / Language / Visual special needs []  - 0 Assessment of Community assistance (transportation, D/C planning, etc.) []  - 0 Additional assistance / Altered mentation []  - 0 Support Surface(s) Assessment (bed, cushion, seat, etc.) INTERVENTIONS - Miscellaneous []  - 0 External ear exam []  - 0 Patient Transfer (multiple staff /  Nurse, adult / Similar devices) []  - 0 Simple Staple / Suture removal (25 or less) []  - 0 Complex Staple / Suture removal (26 or more) []  - 0 Hypo/Hyperglycemic Management (do not check if billed separately) X- 1 15 Ankle / Brachial Index (ABI) - do not check if billed separately Has the patient been seen at the hospital within the last three years: Yes Total Score: 110  [name], [name] in Treatment: 0 Wound Status Wound Number: 1 Primary Diabetic Wound/Ulcer of the Lower Extremity Etiology: Wound Location: Right, Medial, Plantar Foot Wound Open Wounding Event: Bump Status: Date Acquired: 09/13/2022 Comorbid Sleep Apnea, Angina, Coronary Artery Disease, Hypertension, Weeks Of Treatment: 0 History: Peripheral Venous Disease, Type II Diabetes, Neuropathy Clustered Wound: No Photos Levi, [name] ([id number]) [id number] Page 8 of 9 Wound Measurements Length: (cm) 0.8 Width: (cm) 0.8 Depth: (cm) 0.8 Area: (cm) 0.503 Volume: (cm) 0.402 % Reduction in Area: % Reduction in Volume: Tunneling: No Undermining: No Wound Description Classification: Grade 2 Wound Margin: Distinct, outline attached Exudate Amount: Medium Exudate Type: Serosanguineous Exudate Color: red, brown Foul Odor After Cleansing: No Slough/Fibrino Yes Wound Bed Granulation Amount: Medium (34-66%) Exposed Structure Granulation Quality: Red, Pink Fat Layer (Subcutaneous Tissue) Exposed: Yes Necrotic Amount: Medium (34-66%) Necrotic Quality: Eschar Periwound Skin Texture Texture Color No Abnormalities Noted: No No Abnormalities Noted: No Scarring: Yes Hemosiderin Staining: Yes Moisture Temperature / Pain No Abnormalities Noted: No Temperature: No Abnormality Dry / Scaly: No Maceration: No Treatment Notes Wound #1 (Foot) Wound Laterality: Plantar, Right, Medial Cleanser Vashe 5.8 (oz) Discharge Instruction: Cleanse the wound with Vashe prior to applying a clean dressing using gauze sponges,  not tissue or cotton balls. Peri-Wound Care Topical Primary Dressing Maxorb Extra Ag+ Alginate Dressing, 4x4.75 (in/in) Discharge Instruction: Apply to wound bed as instructed Secondary Dressing Secured With Kerlix Roll Sterile, 4.5x3.1 (in/yd) Discharge Instruction: Secure with Kerlix as directed. 59M Medipore H Soft Cloth Surgical T ape, 4 x 10 (in/yd) Discharge Instruction: Secure with tape as directed. Compression Wrap Compression Stockings Add-Ons Electronic Signature(s) Signed: 01/01/2023 4:20:28 PM By: Brenton Grills Entered By: Brenton Grills on 12/30/2022 13:27:47 -------------------------------------------------------------------------------- Vitals Details Patient Name: Date of Service: [name], [name] 12/30/2022 12:30 PM [name] ([id number]) [id number] Page 9 of 9 Medical Record Number: [id number] Patient Account Number: [id number] Date of Birth/Sex: Treating RN: Mar 19, 1956 (67 y.o. Yates Decamp Primary Care Prophet Renwick: [name] Clinician: Referring Dosia Yodice: Treating Moriah Loughry/Extender: Nevin Bloodgood in Treatment: 0 Vital Signs Time Taken: 12:46 Temperature (F): 98 Pulse (bpm): 68 Respiratory Rate (breaths/min): 18 Blood Pressure (mmHg): 110/77 Capillary Blood Glucose (mg/dl): 595 Reference Range: 80 - 120 mg / dl Electronic Signature(s) Signed: 01/01/2023 4:20:28 PM By: Brenton Grills Entered By: Brenton Grills on 12/30/2022 12:46:53  [name], [name] in Treatment: 0 Wound Status Wound Number: 1 Primary Diabetic Wound/Ulcer of the Lower Extremity Etiology: Wound Location: Right, Medial, Plantar Foot Wound Open Wounding Event: Bump Status: Date Acquired: 09/13/2022 Comorbid Sleep Apnea, Angina, Coronary Artery Disease, Hypertension, Weeks Of Treatment: 0 History: Peripheral Venous Disease, Type II Diabetes, Neuropathy Clustered Wound: No Photos Levi, [name] ([id number]) [id number] Page 8 of 9 Wound Measurements Length: (cm) 0.8 Width: (cm) 0.8 Depth: (cm) 0.8 Area: (cm) 0.503 Volume: (cm) 0.402 % Reduction in Area: % Reduction in Volume: Tunneling: No Undermining: No Wound Description Classification: Grade 2 Wound Margin: Distinct, outline attached Exudate Amount: Medium Exudate Type: Serosanguineous Exudate Color: red, brown Foul Odor After Cleansing: No Slough/Fibrino Yes Wound Bed Granulation Amount: Medium (34-66%) Exposed Structure Granulation Quality: Red, Pink Fat Layer (Subcutaneous Tissue) Exposed: Yes Necrotic Amount: Medium (34-66%) Necrotic Quality: Eschar Periwound Skin Texture Texture Color No Abnormalities Noted: No No Abnormalities Noted: No Scarring: Yes Hemosiderin Staining: Yes Moisture Temperature / Pain No Abnormalities Noted: No Temperature: No Abnormality Dry / Scaly: No Maceration: No Treatment Notes Wound #1 (Foot) Wound Laterality: Plantar, Right, Medial Cleanser Vashe 5.8 (oz) Discharge Instruction: Cleanse the wound with Vashe prior to applying a clean dressing using gauze sponges,  not tissue or cotton balls. Peri-Wound Care Topical Primary Dressing Maxorb Extra Ag+ Alginate Dressing, 4x4.75 (in/in) Discharge Instruction: Apply to wound bed as instructed Secondary Dressing Secured With Kerlix Roll Sterile, 4.5x3.1 (in/yd) Discharge Instruction: Secure with Kerlix as directed. 59M Medipore H Soft Cloth Surgical T ape, 4 x 10 (in/yd) Discharge Instruction: Secure with tape as directed. Compression Wrap Compression Stockings Add-Ons Electronic Signature(s) Signed: 01/01/2023 4:20:28 PM By: Brenton Grills Entered By: Brenton Grills on 12/30/2022 13:27:47 -------------------------------------------------------------------------------- Vitals Details Patient Name: Date of Service: [name], [name] 12/30/2022 12:30 PM [name] ([id number]) [id number] Page 9 of 9 Medical Record Number: [id number] Patient Account Number: [id number] Date of Birth/Sex: Treating RN: Mar 19, 1956 (67 y.o. Yates Decamp Primary Care Prophet Renwick: [name] Clinician: Referring Dosia Yodice: Treating Moriah Loughry/Extender: Nevin Bloodgood in Treatment: 0 Vital Signs Time Taken: 12:46 Temperature (F): 98 Pulse (bpm): 68 Respiratory Rate (breaths/min): 18 Blood Pressure (mmHg): 110/77 Capillary Blood Glucose (mg/dl): 595 Reference Range: 80 - 120 mg / dl Electronic Signature(s) Signed: 01/01/2023 4:20:28 PM By: Brenton Grills Entered By: Brenton Grills on 12/30/2022 12:46:53

## 2023-01-04 LAB — AEROBIC CULTURE W GRAM STAIN (SUPERFICIAL SPECIMEN): Gram Stain: NONE SEEN

## 2023-01-06 ENCOUNTER — Encounter (HOSPITAL_BASED_OUTPATIENT_CLINIC_OR_DEPARTMENT_OTHER): Payer: Medicare PPO | Admitting: Internal Medicine

## 2023-01-06 DIAGNOSIS — E1140 Type 2 diabetes mellitus with diabetic neuropathy, unspecified: Secondary | ICD-10-CM | POA: Diagnosis not present

## 2023-01-06 DIAGNOSIS — E11621 Type 2 diabetes mellitus with foot ulcer: Secondary | ICD-10-CM | POA: Diagnosis not present

## 2023-01-06 DIAGNOSIS — G4730 Sleep apnea, unspecified: Secondary | ICD-10-CM | POA: Diagnosis not present

## 2023-01-06 DIAGNOSIS — Z6836 Body mass index (BMI) 36.0-36.9, adult: Secondary | ICD-10-CM | POA: Diagnosis not present

## 2023-01-06 DIAGNOSIS — I10 Essential (primary) hypertension: Secondary | ICD-10-CM | POA: Diagnosis not present

## 2023-01-06 DIAGNOSIS — E11610 Type 2 diabetes mellitus with diabetic neuropathic arthropathy: Secondary | ICD-10-CM | POA: Diagnosis not present

## 2023-01-06 DIAGNOSIS — G4733 Obstructive sleep apnea (adult) (pediatric): Secondary | ICD-10-CM | POA: Diagnosis not present

## 2023-01-06 DIAGNOSIS — L97518 Non-pressure chronic ulcer of other part of right foot with other specified severity: Secondary | ICD-10-CM | POA: Diagnosis not present

## 2023-01-06 DIAGNOSIS — L97512 Non-pressure chronic ulcer of other part of right foot with fat layer exposed: Secondary | ICD-10-CM | POA: Diagnosis not present

## 2023-01-06 DIAGNOSIS — E1165 Type 2 diabetes mellitus with hyperglycemia: Secondary | ICD-10-CM | POA: Diagnosis not present

## 2023-01-07 NOTE — Progress Notes (Signed)
[name] Other Clinician: Referring Provider: Treating Provider/Extender: [name], [name], [name] [name] ([id number]) [phone].pdf Page 4 of 6 Weeks in Treatment: 1 Active Problems ICD-10 Encounter Code Description Active Date MDM Diagnosis E11.621 Type 2 diabetes mellitus with foot ulcer 12/30/2022 No Yes L97.518 Non-pressure chronic ulcer of other part of right foot with other specified 12/30/2022 No Yes severity M14.671 Charcot's joint, right ankle and foot 12/30/2022 No Yes Inactive Problems Resolved Problems Electronic Signature(s) Signed: 01/06/2023 4:52:49 PM By: Baltazar Najjar MD Entered By: Baltazar Najjar on 01/06/2023 16:11:22 -------------------------------------------------------------------------------- Progress Note Details Patient Name: Date of Service: [name] 01/06/2023 2:30 PM Medical Record Number: [id number] Patient Account Number: [id number] Date of Birth/Sex: Treating RN: 16-Feb-1956 (67 y.o. M) Primary Care Provider: Eleanora [name] Other Clinician: Referring Provider: Treating Provider/Extender: [name] in Treatment: 1 Subjective History of Present Illness  (HPI) ADMISSION 12/30/2022 Mr. Clere is a 67 year old man with type 2 diabetes and a Charcot foot on the right. He has a history of bilateral foot wounds cared for by [facility]. He has also had osteomyelitis with a left second toe amputation on 09/08/2022. He tells me that he is been dealing with a wound on his plantar foot for the last month to 2. He has been using Silvadene with surrounding AandD. More recently Iodoflex and silver alginate. He was in a Psychologist, forensic however more recently has changed to a well-padded surgical shoe. He thinks since this change was made that things are actually better. He is retired, not tremendously active but not sedentary either. Past medical history type 2 diabetes with Charcot deformity, osteomyelitis of left second toe status post amputation ABIs at 1.23 on the left and 1.17 on the right. Also noted that he appears to have had normal arterial studies done by Dr. Kirke Corin in August we will need to check these next time. I do not believe he has significant arterial disease 9/24; this is a patient we admitted to clinic last week he has type 2 diabetes and a Charcot foot on the right. He was put in a Pegasys shoe at podiatry before he came here and this seems to have really helped things. Last week I debrided this we used silver alginate. Because of the amount of debris over the wound surface I cultured the wound and it grew abundant Morganella. Also Enterococcus. [address] [name], [name] ([id number]) [id number] Page 5 of 6 Constitutional Sitting or standing Blood Pressure is within target range for patient.. Pulse regular and within target range for patient.Marland Kitchen Respirations regular, non-labored and within target range.. Temperature is normal and within the target range for the patient.Marland Kitchen Appears in no distress. Vitals Time Taken: 3:21 PM, Temperature: 97.8 F, Pulse: 68 bpm, Respiratory Rate: 16 breaths/min, Blood Pressure: 144/82  mmHg. General Notes: Wound exam; area in question is on the medial part of the right Charcot foot. There is less callus around the wound therefore the depth is come in. I used a #3 curette today to remove thick skin from the wound margins and overhanging skin. Hemostasis with direct pressure. The wound does not probe to bone. There is no evidence of surrounding infection Integumentary (Hair, Skin) Wound #1 status is Open. Original cause of wound was Bump. The date acquired was: 09/13/2022. The wound has been in treatment 1 weeks. The wound is located on the Right,Medial,Plantar Foot. The wound measures 0.5cm length x 0.5cm width x 0.3cm depth; 0.196cm^2 area and 0.059cm^3 volume. There is Fat Layer (Subcutaneous Tissue)  [name], [name] ([id number]) [id number] Page 1 of 6 Visit Report for 01/06/2023 Debridement Details Patient Name: Date of Service: [name], [name] 01/06/2023 2:30 PM Medical Record Number: [id number] Patient Account Number: [id number] Date of Birth/Sex: Treating RN: [birth year] (67 y.o. M) Primary Care Provider: Eleanora [name] Other Clinician: Referring Provider: Treating Provider/Extender: [name] in Treatment: 1 Debridement Performed for Assessment: Wound #1 Right,Medial,Plantar Foot Performed By: Physician Maxwell Caul., MD The following information was scribed by: Shawn Stall The information was scribed for: Baltazar Najjar Debridement Type: Debridement Severity of Tissue Pre Debridement: Fat layer exposed Level of Consciousness (Pre-procedure): Awake and Alert Pre-procedure Verification/Time Out Yes - 15:40 Taken: Start Time: 15:41 Pain Control: Lidocaine 4% T opical Solution Percent of Wound Bed Debrided: 100% T Area Debrided (cm): otal 0.2 Tissue and other material debrided: Viable, Non-Viable, Callus, Slough, Subcutaneous, Skin: Dermis , Skin: Epidermis, Slough Level: Skin/Subcutaneous Tissue Debridement Description: Excisional Instrument: Curette Bleeding: Minimum Hemostasis Achieved: Pressure End Time: 15:47 Procedural Pain: 0 Post Procedural Pain: 0 Response to Treatment: Procedure was tolerated well Level of Consciousness (Post- Awake and Alert procedure): Post Debridement Measurements of Total Wound Length: (cm) 0.5 Width: (cm) 0.5 Depth: (cm) 0.3 Volume: (cm) 0.059 Character of Wound/Ulcer Post Debridement: Improved Severity of Tissue Post Debridement: Fat layer exposed Post Procedure Diagnosis Same as Pre-procedure Electronic Signature(s) Signed: 01/06/2023 4:52:49 PM By: Baltazar Najjar MD Entered By: Baltazar Najjar on 01/06/2023  16:11:52 -------------------------------------------------------------------------------- HPI Details Patient Name: Date of Service: [name] 01/06/2023 2:30 PM Medical Record Number: [id number] Patient Account Number: [id number] Date of Birth/Sex: Treating RN: 1956-02-27 (67 y.o. M) Primary Care Provider: Eleanora [name] Other Clinician: Referring Provider: Treating Provider/Extender: [name] in Treatment: 1 [name], [name] ([id number]) [id number] Page 2 of 6 History of Present Illness HPI Description: ADMISSION 12/30/2022 Mr. Macari is a 67 year old man with type 2 diabetes and a Charcot foot on the right. He has a history of bilateral foot wounds cared for by [facility]. He has also had osteomyelitis with a left second toe amputation on 09/08/2022. He tells me that he is been dealing with a wound on his plantar foot for the last month to 2. He has been using Silvadene with surrounding AandD. More recently Iodoflex and silver alginate. He was in a Psychologist, forensic however more recently has changed to a well-padded surgical shoe. He thinks since this change was made that things are actually better. He is retired, not tremendously active but not sedentary either. Past medical history type 2 diabetes with Charcot deformity, osteomyelitis of left second toe status post amputation ABIs at 1.23 on the left and 1.17 on the right. Also noted that he appears to have had normal arterial studies done by Dr. Kirke Corin in August we will need to check these next time. I do not believe he has significant arterial disease 9/24; this is a patient we admitted to clinic last week he has type 2 diabetes and a Charcot foot on the right. He was put in a Pegasys shoe at podiatry before he came here and this seems to have really helped things. Last week I debrided this we used silver alginate. Because of the amount of debris over the  wound surface I cultured the wound and it grew abundant Morganella. Also Enterococcus. Electronic Signature(s) Signed: 01/06/2023 4:52:49 PM By: Baltazar Najjar MD Entered By: Baltazar Najjar on 01/06/2023 16:13:20 -------------------------------------------------------------------------------- Physical Exam Details Patient Name: Date of Service: Levi Sanfilippo [name]. 01/06/2023 2:30  [name], [name] ([id number]) [id number] Page 1 of 6 Visit Report for 01/06/2023 Debridement Details Patient Name: Date of Service: [name], [name] 01/06/2023 2:30 PM Medical Record Number: [id number] Patient Account Number: [id number] Date of Birth/Sex: Treating RN: [birth year] (67 y.o. M) Primary Care Provider: Eleanora [name] Other Clinician: Referring Provider: Treating Provider/Extender: [name] in Treatment: 1 Debridement Performed for Assessment: Wound #1 Right,Medial,Plantar Foot Performed By: Physician Maxwell Caul., MD The following information was scribed by: Shawn Stall The information was scribed for: Baltazar Najjar Debridement Type: Debridement Severity of Tissue Pre Debridement: Fat layer exposed Level of Consciousness (Pre-procedure): Awake and Alert Pre-procedure Verification/Time Out Yes - 15:40 Taken: Start Time: 15:41 Pain Control: Lidocaine 4% T opical Solution Percent of Wound Bed Debrided: 100% T Area Debrided (cm): otal 0.2 Tissue and other material debrided: Viable, Non-Viable, Callus, Slough, Subcutaneous, Skin: Dermis , Skin: Epidermis, Slough Level: Skin/Subcutaneous Tissue Debridement Description: Excisional Instrument: Curette Bleeding: Minimum Hemostasis Achieved: Pressure End Time: 15:47 Procedural Pain: 0 Post Procedural Pain: 0 Response to Treatment: Procedure was tolerated well Level of Consciousness (Post- Awake and Alert procedure): Post Debridement Measurements of Total Wound Length: (cm) 0.5 Width: (cm) 0.5 Depth: (cm) 0.3 Volume: (cm) 0.059 Character of Wound/Ulcer Post Debridement: Improved Severity of Tissue Post Debridement: Fat layer exposed Post Procedure Diagnosis Same as Pre-procedure Electronic Signature(s) Signed: 01/06/2023 4:52:49 PM By: Baltazar Najjar MD Entered By: Baltazar Najjar on 01/06/2023  16:11:52 -------------------------------------------------------------------------------- HPI Details Patient Name: Date of Service: [name] 01/06/2023 2:30 PM Medical Record Number: [id number] Patient Account Number: [id number] Date of Birth/Sex: Treating RN: 1956-02-27 (67 y.o. M) Primary Care Provider: Eleanora [name] Other Clinician: Referring Provider: Treating Provider/Extender: [name] in Treatment: 1 [name], [name] ([id number]) [id number] Page 2 of 6 History of Present Illness HPI Description: ADMISSION 12/30/2022 Mr. Macari is a 67 year old man with type 2 diabetes and a Charcot foot on the right. He has a history of bilateral foot wounds cared for by [facility]. He has also had osteomyelitis with a left second toe amputation on 09/08/2022. He tells me that he is been dealing with a wound on his plantar foot for the last month to 2. He has been using Silvadene with surrounding AandD. More recently Iodoflex and silver alginate. He was in a Psychologist, forensic however more recently has changed to a well-padded surgical shoe. He thinks since this change was made that things are actually better. He is retired, not tremendously active but not sedentary either. Past medical history type 2 diabetes with Charcot deformity, osteomyelitis of left second toe status post amputation ABIs at 1.23 on the left and 1.17 on the right. Also noted that he appears to have had normal arterial studies done by Dr. Kirke Corin in August we will need to check these next time. I do not believe he has significant arterial disease 9/24; this is a patient we admitted to clinic last week he has type 2 diabetes and a Charcot foot on the right. He was put in a Pegasys shoe at podiatry before he came here and this seems to have really helped things. Last week I debrided this we used silver alginate. Because of the amount of debris over the  wound surface I cultured the wound and it grew abundant Morganella. Also Enterococcus. Electronic Signature(s) Signed: 01/06/2023 4:52:49 PM By: Baltazar Najjar MD Entered By: Baltazar Najjar on 01/06/2023 16:13:20 -------------------------------------------------------------------------------- Physical Exam Details Patient Name: Date of Service: Levi Sanfilippo [name]. 01/06/2023 2:30  [name] Other Clinician: Referring Provider: Treating Provider/Extender: [name], [name], [name] [name] ([id number]) [phone].pdf Page 4 of 6 Weeks in Treatment: 1 Active Problems ICD-10 Encounter Code Description Active Date MDM Diagnosis E11.621 Type 2 diabetes mellitus with foot ulcer 12/30/2022 No Yes L97.518 Non-pressure chronic ulcer of other part of right foot with other specified 12/30/2022 No Yes severity M14.671 Charcot's joint, right ankle and foot 12/30/2022 No Yes Inactive Problems Resolved Problems Electronic Signature(s) Signed: 01/06/2023 4:52:49 PM By: Baltazar Najjar MD Entered By: Baltazar Najjar on 01/06/2023 16:11:22 -------------------------------------------------------------------------------- Progress Note Details Patient Name: Date of Service: [name] 01/06/2023 2:30 PM Medical Record Number: [id number] Patient Account Number: [id number] Date of Birth/Sex: Treating RN: 16-Feb-1956 (67 y.o. M) Primary Care Provider: Eleanora [name] Other Clinician: Referring Provider: Treating Provider/Extender: [name] in Treatment: 1 Subjective History of Present Illness  (HPI) ADMISSION 12/30/2022 Mr. Clere is a 67 year old man with type 2 diabetes and a Charcot foot on the right. He has a history of bilateral foot wounds cared for by [facility]. He has also had osteomyelitis with a left second toe amputation on 09/08/2022. He tells me that he is been dealing with a wound on his plantar foot for the last month to 2. He has been using Silvadene with surrounding AandD. More recently Iodoflex and silver alginate. He was in a Psychologist, forensic however more recently has changed to a well-padded surgical shoe. He thinks since this change was made that things are actually better. He is retired, not tremendously active but not sedentary either. Past medical history type 2 diabetes with Charcot deformity, osteomyelitis of left second toe status post amputation ABIs at 1.23 on the left and 1.17 on the right. Also noted that he appears to have had normal arterial studies done by Dr. Kirke Corin in August we will need to check these next time. I do not believe he has significant arterial disease 9/24; this is a patient we admitted to clinic last week he has type 2 diabetes and a Charcot foot on the right. He was put in a Pegasys shoe at podiatry before he came here and this seems to have really helped things. Last week I debrided this we used silver alginate. Because of the amount of debris over the wound surface I cultured the wound and it grew abundant Morganella. Also Enterococcus. [address] [name], [name] ([id number]) [id number] Page 5 of 6 Constitutional Sitting or standing Blood Pressure is within target range for patient.. Pulse regular and within target range for patient.Marland Kitchen Respirations regular, non-labored and within target range.. Temperature is normal and within the target range for the patient.Marland Kitchen Appears in no distress. Vitals Time Taken: 3:21 PM, Temperature: 97.8 F, Pulse: 68 bpm, Respiratory Rate: 16 breaths/min, Blood Pressure: 144/82  mmHg. General Notes: Wound exam; area in question is on the medial part of the right Charcot foot. There is less callus around the wound therefore the depth is come in. I used a #3 curette today to remove thick skin from the wound margins and overhanging skin. Hemostasis with direct pressure. The wound does not probe to bone. There is no evidence of surrounding infection Integumentary (Hair, Skin) Wound #1 status is Open. Original cause of wound was Bump. The date acquired was: 09/13/2022. The wound has been in treatment 1 weeks. The wound is located on the Right,Medial,Plantar Foot. The wound measures 0.5cm length x 0.5cm width x 0.3cm depth; 0.196cm^2 area and 0.059cm^3 volume. There is Fat Layer (Subcutaneous Tissue)  [name] Other Clinician: Referring Provider: Treating Provider/Extender: [name], [name], [name] [name] ([id number]) [phone].pdf Page 4 of 6 Weeks in Treatment: 1 Active Problems ICD-10 Encounter Code Description Active Date MDM Diagnosis E11.621 Type 2 diabetes mellitus with foot ulcer 12/30/2022 No Yes L97.518 Non-pressure chronic ulcer of other part of right foot with other specified 12/30/2022 No Yes severity M14.671 Charcot's joint, right ankle and foot 12/30/2022 No Yes Inactive Problems Resolved Problems Electronic Signature(s) Signed: 01/06/2023 4:52:49 PM By: Baltazar Najjar MD Entered By: Baltazar Najjar on 01/06/2023 16:11:22 -------------------------------------------------------------------------------- Progress Note Details Patient Name: Date of Service: [name] 01/06/2023 2:30 PM Medical Record Number: [id number] Patient Account Number: [id number] Date of Birth/Sex: Treating RN: 16-Feb-1956 (67 y.o. M) Primary Care Provider: Eleanora [name] Other Clinician: Referring Provider: Treating Provider/Extender: [name] in Treatment: 1 Subjective History of Present Illness  (HPI) ADMISSION 12/30/2022 Mr. Clere is a 67 year old man with type 2 diabetes and a Charcot foot on the right. He has a history of bilateral foot wounds cared for by [facility]. He has also had osteomyelitis with a left second toe amputation on 09/08/2022. He tells me that he is been dealing with a wound on his plantar foot for the last month to 2. He has been using Silvadene with surrounding AandD. More recently Iodoflex and silver alginate. He was in a Psychologist, forensic however more recently has changed to a well-padded surgical shoe. He thinks since this change was made that things are actually better. He is retired, not tremendously active but not sedentary either. Past medical history type 2 diabetes with Charcot deformity, osteomyelitis of left second toe status post amputation ABIs at 1.23 on the left and 1.17 on the right. Also noted that he appears to have had normal arterial studies done by Dr. Kirke Corin in August we will need to check these next time. I do not believe he has significant arterial disease 9/24; this is a patient we admitted to clinic last week he has type 2 diabetes and a Charcot foot on the right. He was put in a Pegasys shoe at podiatry before he came here and this seems to have really helped things. Last week I debrided this we used silver alginate. Because of the amount of debris over the wound surface I cultured the wound and it grew abundant Morganella. Also Enterococcus. [address] [name], [name] ([id number]) [id number] Page 5 of 6 Constitutional Sitting or standing Blood Pressure is within target range for patient.. Pulse regular and within target range for patient.Marland Kitchen Respirations regular, non-labored and within target range.. Temperature is normal and within the target range for the patient.Marland Kitchen Appears in no distress. Vitals Time Taken: 3:21 PM, Temperature: 97.8 F, Pulse: 68 bpm, Respiratory Rate: 16 breaths/min, Blood Pressure: 144/82  mmHg. General Notes: Wound exam; area in question is on the medial part of the right Charcot foot. There is less callus around the wound therefore the depth is come in. I used a #3 curette today to remove thick skin from the wound margins and overhanging skin. Hemostasis with direct pressure. The wound does not probe to bone. There is no evidence of surrounding infection Integumentary (Hair, Skin) Wound #1 status is Open. Original cause of wound was Bump. The date acquired was: 09/13/2022. The wound has been in treatment 1 weeks. The wound is located on the Right,Medial,Plantar Foot. The wound measures 0.5cm length x 0.5cm width x 0.3cm depth; 0.196cm^2 area and 0.059cm^3 volume. There is Fat Layer (Subcutaneous Tissue)

## 2023-01-07 NOTE — Progress Notes (Signed)
[name] ([id number]) [id number] Page 1 of 7 Visit Report for 01/06/2023 Arrival Information Details Patient Name: Date of Service: [name], [name] 01/06/2023 2:30 PM Medical Record Number: [id number] Patient Account Number: [id number] Date of Birth/Sex: Treating RN: 01-22-1956 (67 y.o. Marlan Palau Primary Care Maryna Yeagle: [name] Clinician: Referring Venezia Sargeant: Treating Layloni Fahrner/Extender: Robley Fries in Treatment: 1 Visit Information History Since Last Visit Added or deleted any medications: No Patient Arrived: Ambulatory Any new allergies or adverse reactions: No Arrival Time: 15:19 Had a fall or experienced change in No Accompanied By: self activities of daily living that may affect Transfer Assistance: None risk of falls: Patient Identification Verified: Yes Signs or symptoms of abuse/neglect since last visito No Secondary Verification Process Completed: Yes Hospitalized since last visit: No Patient Requires Transmission-Based Precautions: No Implantable device outside of the clinic excluding No Patient Has Alerts: No cellular tissue based products placed in the center since last visit: Has Dressing in Place as Prescribed: Yes Pain Present Now: No Electronic Signature(s) Signed: 01/06/2023 3:37:55 PM By: Samuella Bruin Entered By: Samuella Bruin on 01/06/2023 15:20:10 -------------------------------------------------------------------------------- Encounter Discharge Information Details Patient Name: Date of Service: [name]. 01/06/2023 2:30 PM Medical Record Number: [id number] Patient Account Number: [id number] Date of Birth/Sex: Treating RN: [birth year] (67 y.o. [name] Primary Care Gera Inboden: [name] Clinician: Referring Jefrey Raburn: Treating Icesis Renn/Extender: Robley Fries in Treatment: 1 Encounter Discharge  Information Items Post Procedure Vitals Discharge Condition: Stable Temperature (F): 97.8 Ambulatory Status: Ambulatory Pulse (bpm): 68 Discharge Destination: Home Respiratory Rate (breaths/min): 18 Transportation: Private Auto Blood Pressure (mmHg): 144/82 Accompanied By: self Schedule Follow-up Appointment: Yes Clinical Summary of Care: Electronic Signature(s) Signed: 01/06/2023 5:19:03 PM By: Shawn Stall RN, BSN Entered By: Shawn Stall on 01/06/2023 15:59:58 [name] ([id number]) [id number] Page 2 of 7 -------------------------------------------------------------------------------- Lower Extremity Assessment Details Patient Name: Date of Service: [name], [name] 01/06/2023 2:30 PM Medical Record Number: [id number] Patient Account Number: [id number] Date of Birth/Sex: Treating RN: 1955-09-25 (67 y.o. Marlan Palau Primary Care Gaylord Seydel: [name] Clinician: Referring Laiken Nohr: Treating Nathin Saran/Extender: Robley Fries in Treatment: 1 Edema Assessment Assessed: Kyra Searles: No] Franne Forts: No] [Left: Edema] [Right: :] Calf Left: Right: Point of Measurement: From Medial Instep 36.5 cm Ankle Left: Right: Point of Measurement: From Medial Instep 22.5 cm Vascular Assessment Extremity colors, hair growth, and conditions: Extremity Color: [Right:Normal] Hair Growth on Extremity: [Right:Yes] Temperature of Extremity: [Right:Warm] Capillary Refill: [Right:< 3 seconds] Dependent Rubor: [Right:No No] Electronic Signature(s) Signed: 01/06/2023 3:37:55 PM By: Samuella Bruin Entered By: Samuella Bruin on 01/06/2023 15:20:20 -------------------------------------------------------------------------------- Multi Wound Chart Details Patient Name: Date of Service: [name] 01/06/2023 2:30 PM Medical Record Number: [id number] Patient Account Number: [id number] Date of Birth/Sex: Treating  RN: [birth year] (67 y.o. M) Primary Care Stephany Poorman: [name] Clinician: Referring Karder Goodin: Treating Missy Baksh/Extender: Robley Fries in Treatment: 1 Vital Signs Height(in): Pulse(bpm): 68 Weight(lbs): Blood Pressure(mmHg): 144/82 Body Mass Index(BMI): Temperature(F): 97.8 Respiratory Rate(breaths/min): 16 [1:Photos:] [N/A:N/A] Right, Medial, Plantar Foot N/A N/A Wound Location: Bump N/A N/A Wounding Event: Diabetic Wound/Ulcer of the Lower N/A N/A Primary Etiology: Extremity Sleep Apnea, Angina, Coronary Artery N/A N/A Comorbid History: Disease, Hypertension, Peripheral Venous Disease, Type II Diabetes, Neuropathy 09/13/2022 N/A N/A Date Acquired: 1 N/A N/A Weeks of Treatment: Open N/A N/A Wound Status: No N/A N/A Wound Recurrence: 0.5x0.5x0.3 N/A N/A Measurements L x W x D (cm) 0.196 N/A N/A A (cm) :  rea 0.059 N/A N/A Volume (cm) : 61.00% N/A N/A % Reduction in A rea: 85.30% N/A N/A % Reduction in Volume: Grade 2 N/A N/A Classification: Medium N/A N/A Exudate A mount: Serosanguineous N/A N/A Exudate Type: red, brown N/A N/A Exudate Color: Distinct, outline attached N/A N/A Wound Margin: Small (1-33%) N/A N/A Granulation A mount: Pink, Pale N/A N/A Granulation Quality: Large (67-100%) N/A N/A Necrotic A mount: Fat Layer (Subcutaneous Tissue): Yes N/A N/A Exposed Structures: Fascia: No Tendon: No Muscle: No Joint: No Bone: No Medium (34-66%) N/A N/A Epithelialization: Debridement - Excisional N/A N/A Debridement: Pre-procedure Verification/Time Out 15:40 N/A N/A Taken: Lidocaine 4% Topical Solution N/A N/A Pain Control: Callus, Subcutaneous, Slough N/A N/A Tissue Debrided: Skin/Subcutaneous Tissue N/A N/A Level: 0.2 N/A N/A Debridement A (sq cm): rea Curette N/A N/A Instrument: Minimum N/A N/A Bleeding: Pressure N/A N/A Hemostasis A chieved: 0 N/A N/A Procedural Pain: 0 N/A N/A Post  Procedural Pain: Procedure was tolerated well N/A N/A Debridement Treatment Response: 0.5x0.5x0.3 N/A N/A Post Debridement Measurements L x W x D (cm) 0.059 N/A N/A Post Debridement Volume: (cm) Callus: Yes N/A N/A Periwound Skin Texture: Scarring: No Maceration: No N/A N/A Periwound Skin Moisture: Dry/Scaly: No Hemosiderin Staining: Yes N/A N/A Periwound Skin Color: No Abnormality N/A N/A Temperature: Debridement N/A N/A Procedures Performed: Treatment Notes Wound #1 (Foot) Wound Laterality: Plantar, Right, Medial Cleanser Vashe 5.8 (oz) Discharge Instruction: Cleanse the wound with Vashe prior to applying a clean dressing using gauze sponges, not tissue or cotton balls. Peri-Wound Care Topical Gentamicin Discharge Instruction: As directed by physician Primary Dressing Maxorb Extra Ag+ Alginate Dressing, 4x4.75 (in/in) Discharge Instruction: Apply to wound bed as instructed Secondary Dressing Secured With [employer], 4.5x3.1 (in/yd) [name], [name] ([id number]) [id number] Page 4 of 7 Discharge Instruction: Secure with Kerlix as directed. 35M Medipore H Soft Cloth Surgical T ape, 4 x 10 (in/yd) Discharge Instruction: Secure with tape as directed. Compression Wrap Compression Stockings Add-Ons Electronic Signature(s) Signed: 01/06/2023 4:52:49 PM By: Baltazar Najjar MD Entered By: Baltazar Najjar on 01/06/2023 16:11:44 -------------------------------------------------------------------------------- Multi-Disciplinary Care Plan Details Patient Name: Date of Service: [name], [name] 01/06/2023 2:30 PM Medical Record Number: [id number] Patient Account Number: [id number] Date of Birth/Sex: Treating RN: [birth year] (67 y.o. [name] Primary Care Krystall Kruckenberg: [name] Clinician: Referring Tinie Mcgloin: Treating Charice Zuno/Extender: Robley Fries in Treatment: 1 Active Inactive Wound/Skin  Impairment Nursing Diagnoses: Impaired tissue integrity Goals: Patient/caregiver will verbalize understanding of skin care regimen Date Initiated: 12/30/2022 Target Resolution Date: 02/13/2023 Goal Status: Active Interventions: Assess patient/caregiver ability to obtain necessary supplies Assess patient/caregiver ability to perform ulcer/skin care regimen upon admission and as needed Provide education on smoking Provide education on ulcer and skin care Screen for HBO Treatment Activities: Consult for HBO : 12/30/2022 Skin care regimen initiated : 12/30/2022 Topical wound management initiated : 12/30/2022 Notes: Electronic Signature(s) Signed: 01/06/2023 5:19:03 PM By: Shawn Stall RN, BSN Entered By: Shawn Stall on 01/06/2023 15:58:48 Pain Assessment Details -------------------------------------------------------------------------------- [name] ([id number]) [id number] Page 5 of 7 Patient Name: Date of Service: [name], [name] 01/06/2023 2:30 PM Medical Record Number: [id number] Patient Account Number: [id number] Date of Birth/Sex: Treating RN: 04-17-1955 (67 y.o. Marlan Palau Primary Care Lee Kalt: [name] Clinician: Referring Latika Kronick: Treating Larkin Alfred/Extender: Robley Fries in Treatment: 1 Active Problems Location of Pain Severity and Description of Pain Patient Has Paino No Site Locations Rate the pain. Current Pain Level: 0 Pain Management and Medication Current  Pain Management: Electronic Signature(s) Signed: 01/06/2023 3:37:55 PM By: Gelene Mink By: Samuella Bruin on 01/06/2023 15:20:17 -------------------------------------------------------------------------------- Patient/Caregiver Education Details Patient Name: Date of Service: [name] 9/24/2024andnbsp2:30 PM Medical Record Number: [id number] Patient Account Number: [id number] Date of  Birth/Gender: Treating RN: [birth year] (67 y.o. [name] Primary Care Physician: [name] Clinician: Referring Physician: Treating Physician/Extender: Robley Fries in Treatment: 1 Education Assessment Education Provided To: Patient Education Topics Provided Wound/Skin Impairment: Handouts: Caring for Your Ulcer Methods: Explain/Verbal Responses: Reinforcements needed Electronic Signature(s) Signed: 01/06/2023 5:19:03 PM By: Shawn Stall RN, BSN Entered By: Shawn Stall on 01/06/2023 15:58:57 [name] ([id number]) [id number] Page 6 of 7 -------------------------------------------------------------------------------- Wound Assessment Details Patient Name: Date of Service: [name], [name] 01/06/2023 2:30 PM Medical Record Number: [id number] Patient Account Number: [id number] Date of Birth/Sex: Treating RN: 02-11-1956 (67 y.o. Marlan Palau Primary Care Marialy Urbanczyk: [name] Clinician: Referring Neisha Hinger: Treating Saniyah Mondesir/Extender: Robley Fries in Treatment: 1 Wound Status Wound Number: 1 Primary Diabetic Wound/Ulcer of the Lower Extremity Etiology: Wound Location: Right, Medial, Plantar Foot Wound Open Wounding Event: Bump Status: Date Acquired: 09/13/2022 Comorbid Sleep Apnea, Angina, Coronary Artery Disease, Hypertension, Weeks Of Treatment: 1 History: Peripheral Venous Disease, Type II Diabetes, Neuropathy Clustered Wound: No Photos Wound Measurements Length: (cm) Width: (cm) Depth: (cm) Area: (cm) Volume: (cm) 0.5 % Reduction in Area: 61% 0.5 % Reduction in Volume: 85.3% 0.3 Epithelialization: Medium (34-66%) 0.196 Tunneling: No 0.059 Undermining: No Wound Description Classification: Grade 2 Wound Margin: Distinct, outline attached Exudate Amount: Medium Exudate Type: Serosanguineous Exudate Color: red, brown Foul Odor  After Cleansing: No Slough/Fibrino Yes Wound Bed Granulation Amount: Small (1-33%) Exposed Structure Granulation Quality: Pink, Pale Fascia Exposed: No Necrotic Amount: Large (67-100%) Fat Layer (Subcutaneous Tissue) Exposed: Yes Necrotic Quality: Adherent Slough Tendon Exposed: No Muscle Exposed: No Joint Exposed: No Bone Exposed: No Periwound Skin Texture Texture Color No Abnormalities Noted: No No Abnormalities Noted: Yes Callus: Yes Temperature / Pain Scarring: No Temperature: No Abnormality Moisture No Abnormalities Noted: Yes Treatment Notes Wound #1 (Foot) Wound Laterality: Plantar, Right, Medial Cleanser LAKIM, LEWANDOSKI [name] ([id number]) [id number].pdf Page 7 of 7 Vashe 5.8 (oz) Discharge Instruction: Cleanse the wound with Vashe prior to applying a clean dressing using gauze sponges, not tissue or cotton balls. Peri-Wound Care Topical Gentamicin Discharge Instruction: As directed by physician Primary Dressing Maxorb Extra Ag+ Alginate Dressing, 4x4.75 (in/in) Discharge Instruction: Apply to wound bed as instructed Secondary Dressing Secured With Kerlix Roll Sterile, 4.5x3.1 (in/yd) Discharge Instruction: Secure with Kerlix as directed. 47M Medipore H Soft Cloth Surgical T ape, 4 x 10 (in/yd) Discharge Instruction: Secure with tape as directed. Compression Wrap Compression Stockings Add-Ons Electronic Signature(s) Signed: 01/06/2023 3:37:55 PM By: Samuella Bruin Signed: 01/06/2023 5:19:03 PM By: Shawn Stall RN, BSN Entered By: Shawn Stall on 01/06/2023 15:25:18 -------------------------------------------------------------------------------- Vitals Details Patient Name: Date of Service: KAYDN, [name] 01/06/2023 2:30 PM Medical Record Number: [id number] Patient Account Number: [id number] Date of Birth/Sex: Treating RN: 1956/01/20 (67 y.o. Marlan Palau Primary Care Azael Ragain: [name] Clinician: Referring  Marv Alfrey: Treating Kahli Mayon/Extender: Robley Fries in Treatment: 1 Vital Signs Time Taken: 15:21 Temperature (F): 97.8 Pulse (bpm): 68 Respiratory Rate (breaths/min): 16 Blood Pressure (mmHg): 144/82 Reference Range: 80 - 120 mg / dl Electronic Signature(s) Signed: 01/06/2023 3:37:55 PM By: Samuella Bruin Entered By: Samuella Bruin on 01/06/2023 15:21:38

## 2023-01-08 NOTE — Progress Notes (Signed)
Subjective:  Patient ID: [name], male    DOB: 02/12/1956,  MRN: [id number] Chief Complaint  Patient presents with   Foot Ulcer    Right foot ulcer recheck-  went to wound center Tuesday where they cut some on it- has been bleeding since then.  Using silver alginate on the wound.  Usually wraps it. Has been wearing the surgical shoe with offloading.       67 y.o. male presents with above concerns.  He has since followed up with the wound care center. He states they did deride the ulcer and also discussed possibility of the total contact cast.  He states that he does bleed still daily.  He does not report any fevers or chills.    Objective:   General: AAO x3, NAD-present send foot defender boot right foot  Dermatological: On the plantar medial aspect of the right foot hyperkeratotic lesion with granular ulceration still present.  Also was recently debrided.  Brings up the same.  There is no probing, and or tunneling.  There is no fluctuation or crepitation.  There is no malodor.  No obvious signs of infection.  Vascular: DP/PT pulses palpable.  There is no pain with calf compression, swelling, warmth, erythema.   Neruologic: Sensation decreased  Musculoskeletal: Significant flatfoot is present.  Hammertoes are present; proximal left third toe hammertoe  Gait: Unassisted, Nonantalgic.    Assessment:   Significant flatfoot with chronic ulceration right foot  Plan:  Patient was evaluated and treated and all questions answered.   Right midfoot ulcer -I did debride the ulcer today although it was recently debrided.  He is in continue with daily dressing changes.  Discussed total contact cast.  Will follow the wound care center next week.  If needed I can apply this next week for him as well.  We discussed the TCC and risks associated with this and duration.  [name]

## 2023-01-11 ENCOUNTER — Other Ambulatory Visit: Payer: Self-pay | Admitting: Podiatry

## 2023-01-12 ENCOUNTER — Ambulatory Visit: Payer: Medicare PPO | Admitting: Podiatry

## 2023-01-12 NOTE — Progress Notes (Signed)
No charge. 

## 2023-01-15 ENCOUNTER — Ambulatory Visit: Payer: Medicare PPO | Admitting: Podiatry

## 2023-01-15 ENCOUNTER — Encounter (HOSPITAL_BASED_OUTPATIENT_CLINIC_OR_DEPARTMENT_OTHER): Payer: Medicare PPO | Attending: Internal Medicine | Admitting: Internal Medicine

## 2023-01-15 DIAGNOSIS — E11621 Type 2 diabetes mellitus with foot ulcer: Secondary | ICD-10-CM | POA: Insufficient documentation

## 2023-01-15 DIAGNOSIS — L97518 Non-pressure chronic ulcer of other part of right foot with other specified severity: Secondary | ICD-10-CM | POA: Insufficient documentation

## 2023-01-15 DIAGNOSIS — E11610 Type 2 diabetes mellitus with diabetic neuropathic arthropathy: Secondary | ICD-10-CM | POA: Insufficient documentation

## 2023-01-15 DIAGNOSIS — Z89422 Acquired absence of other left toe(s): Secondary | ICD-10-CM | POA: Insufficient documentation

## 2023-01-15 DIAGNOSIS — Z8619 Personal history of other infectious and parasitic diseases: Secondary | ICD-10-CM | POA: Insufficient documentation

## 2023-01-15 DIAGNOSIS — I10 Essential (primary) hypertension: Secondary | ICD-10-CM | POA: Diagnosis not present

## 2023-01-15 DIAGNOSIS — E1140 Type 2 diabetes mellitus with diabetic neuropathy, unspecified: Secondary | ICD-10-CM | POA: Insufficient documentation

## 2023-01-15 DIAGNOSIS — L97512 Non-pressure chronic ulcer of other part of right foot with fat layer exposed: Secondary | ICD-10-CM | POA: Diagnosis not present

## 2023-01-15 NOTE — Progress Notes (Addendum)
WARNELL, RASNIC K ([id number]) [id number] Page 1 of 7 Visit Report for 01/15/2023 Arrival Information Details Patient Name: Date of Service: [name], FLENNER 01/15/2023 9:00 A M Medical Record Number: [id number] Patient Account Number: [id number] Date of Birth/Sex: Treating RN: [birth year] (67 y.o. M) Primary Care Laiba Fuerte: [name] Clinician: Referring Damary Doland: Treating Lot Medford/Extender: Robley Fries in Treatment: 2 Visit Information History Since Last Visit Added or deleted any medications: No Patient Arrived: Ambulatory Any new allergies or adverse reactions: No Arrival Time: 08:49 Had a fall or experienced change in No Accompanied By: self activities of daily living that may affect Transfer Assistance: None risk of falls: Patient Identification Verified: Yes Signs or symptoms of abuse/neglect since last visito No Secondary Verification Process Completed: Yes Hospitalized since last visit: No Patient Requires Transmission-Based Precautions: No Implantable device outside of the clinic excluding No Patient Has Alerts: No cellular tissue based products placed in the center since last visit: Pain Present Now: No Electronic Signature(s) Signed: 01/15/2023 9:43:45 AM By: Dayton Scrape Entered By: Dayton Scrape on 01/15/2023 05:52:38 -------------------------------------------------------------------------------- Encounter Discharge Information Details Patient Name: Date of Service: Levi Barth. 01/15/2023 9:00 A M Medical Record Number: [id number] Patient Account Number: [id number] Date of Birth/Sex: Treating RN: 18-Apr-1955 (67 y.o. Dianna Limbo Primary Care Jameon Deller: [name] Clinician: Referring Takiah Maiden: Treating Murry Khiev/Extender: Robley Fries in Treatment: 2 Encounter Discharge Information Items Post Procedure Vitals Discharge Condition:  Stable Temperature (F): 97.9 Ambulatory Status: Ambulatory Pulse (bpm): 85 Discharge Destination: Home Respiratory Rate (breaths/min): 18 Transportation: Private Auto Blood Pressure (mmHg): 131/85 Accompanied By: self Schedule Follow-up Appointment: Yes Clinical Summary of Care: Patient Declined Electronic Signature(s) Signed: 01/15/2023 9:47:17 AM By: Karie Schwalbe RN Entered By: Karie Schwalbe on 01/15/2023 06:46:33 Javid, Telford Nab ([id number]) [id number] Page 2 of 7 -------------------------------------------------------------------------------- Lower Extremity Assessment Details Patient Name: Date of Service: [name], Levi 01/15/2023 9:00 A M Medical Record Number: [id number] Patient Account Number: [id number] Date of Birth/Sex: Treating RN: May 21, 1955 (67 y.o. Dianna Limbo Primary Care Ayauna Mcnay: [name] Clinician: Referring Alyssia Heese: Treating Trivia Heffelfinger/Extender: Robley Fries in Treatment: 2 Edema Assessment Assessed: Kyra Searles: No] Franne Forts: No] [Left: Edema] [Right: :] Calf Left: Right: Point of Measurement: From Medial Instep 36.5 cm Ankle Left: Right: Point of Measurement: From Medial Instep 22.5 cm Vascular Assessment Pulses: Dorsalis Pedis Palpable: [Right:Yes] Extremity colors, hair growth, and conditions: Extremity Color: [Right:Normal] Hair Growth on Extremity: [Right:Yes] Temperature of Extremity: [Right:Warm] Capillary Refill: [Right:< 3 seconds] Dependent Rubor: [Right:No No] Electronic Signature(s) Signed: 01/15/2023 9:47:17 AM By: Karie Schwalbe RN Entered By: Karie Schwalbe on 01/15/2023 06:24:41 -------------------------------------------------------------------------------- Multi Wound Chart Details Patient Name: Date of Service: Levi Barth. 01/15/2023 9:00 A M Medical Record Number: [id number] Patient Account Number: [id number] Date of Birth/Sex: Treating  RN: 07-07-1955 (10 y.o. M) Primary Care Nayara Taplin: [name] Clinician: Referring Justin Meisenheimer: Treating Arlen Legendre/Extender: Robley Fries in Treatment: 2 Vital Signs Height(in): 68 Pulse(bpm): 85 Weight(lbs): 235 Blood Pressure(mmHg): 131/85 Body Mass Index(BMI): 35.7 Temperature(F): 97.9 Respiratory Rate(breaths/min): 18 [1:Photos:] [N/A:N/A] Right, Medial, Plantar Foot N/A N/A Wound Location: Bump N/A N/A Wounding Event: Diabetic Wound/Ulcer of the Lower N/A N/A Primary Etiology: Extremity Sleep Apnea, Angina, Coronary Artery N/A N/A Comorbid History: Disease, Hypertension, Peripheral Venous Disease, Type II Diabetes, Neuropathy 09/13/2022 N/A N/A Date Acquired: 2 N/A N/A Weeks of Treatment: Open N/A N/A Wound Status: No N/A N/A Wound Recurrence: 0.5x0.5x0.3 N/A N/A Measurements L x W x D (cm) 0.196  N/A N/A A (cm) : rea 0.059 N/A N/A Volume (cm) : 61.00% N/A N/A % Reduction in A rea: 85.30% N/A N/A % Reduction in Volume: 8 Starting Position 1 (o'clock): 9 Ending Position 1 (o'clock): 0.3 Maximum Distance 1 (cm): Yes N/A N/A Undermining: Grade 2 N/A N/A Classification: Medium N/A N/A Exudate A mount: Serosanguineous N/A N/A Exudate Type: red, brown N/A N/A Exudate Color: Distinct, outline attached N/A N/A Wound Margin: Small (1-33%) N/A N/A Granulation A mount: Pink, Pale N/A N/A Granulation Quality: Large (67-100%) N/A N/A Necrotic A mount: Eschar, Adherent Slough N/A N/A Necrotic Tissue: Fat Layer (Subcutaneous Tissue): Yes N/A N/A Exposed Structures: Fascia: No Tendon: No Muscle: No Joint: No Bone: No Medium (34-66%) N/A N/A Epithelialization: Debridement - Excisional N/A N/A Debridement: Pre-procedure Verification/Time Out 09:17 N/A N/A Taken: Lidocaine 5% topical ointment N/A N/A Pain Control: Subcutaneous N/A N/A Tissue Debrided: Skin/Subcutaneous Tissue N/A N/A Level: 0.2 N/A  N/A Debridement A (sq cm): rea Curette N/A N/A Instrument: Minimum N/A N/A Bleeding: Pressure N/A N/A Hemostasis A chieved: 0 N/A N/A Procedural Pain: 0 N/A N/A Post Procedural Pain: Procedure was tolerated well N/A N/A Debridement Treatment Response: 0.5x0.5x0.3 N/A N/A Post Debridement Measurements L x W x D (cm) 0.059 N/A N/A Post Debridement Volume: (cm) Callus: Yes N/A N/A Periwound Skin Texture: Scarring: No Maceration: No N/A N/A Periwound Skin Moisture: Dry/Scaly: No Hemosiderin Staining: Yes N/A N/A Periwound Skin Color: No Abnormality N/A N/A Temperature: Debridement N/A N/A Procedures Performed: Treatment Notes Wound #1 (Foot) Wound Laterality: Plantar, Right, Medial Cleanser Vashe 5.8 (oz) Discharge Instruction: Cleanse the wound with Vashe prior to applying a clean dressing using gauze sponges, not tissue or cotton balls. Peri-Wound Care Topical Gentamicin Discharge Instruction: As directed by physician KESTON, SEEVER K ([id number]) [id number] Page 4 of 7 Primary Dressing Maxorb Extra Ag+ Alginate Dressing, 4x4.75 (in/in) Discharge Instruction: Apply to wound bed as instructed Secondary Dressing Secured With Kerlix Roll Sterile, 4.5x3.1 (in/yd) Discharge Instruction: Secure with Kerlix as directed. 21M Medipore H Soft Cloth Surgical T ape, 4 x 10 (in/yd) Discharge Instruction: Secure with tape as directed. Compression Wrap Compression Stockings Add-Ons Electronic Signature(s) Signed: 01/15/2023 5:27:16 PM By: Baltazar Najjar MD Entered By: Baltazar Najjar on 01/15/2023 07:05:54 -------------------------------------------------------------------------------- Multi-Disciplinary Care Plan Details Patient Name: Date of Service: Levi Barth. 01/15/2023 9:00 A M Medical Record Number: [id number] Patient Account Number: [id number] Date of Birth/Sex: Treating RN: 1955-05-15 (67 y.o. Dianna Limbo Primary Care Berenis Corter:  [name] Clinician: Referring Acey Woodfield: Treating Jestine Bicknell/Extender: Robley Fries in Treatment: 2 Active Inactive Wound/Skin Impairment Nursing Diagnoses: Impaired tissue integrity Goals: Patient/caregiver will verbalize understanding of skin care regimen Date Initiated: 12/30/2022 Target Resolution Date: 02/13/2023 Goal Status: Active Interventions: Assess patient/caregiver ability to obtain necessary supplies Assess patient/caregiver ability to perform ulcer/skin care regimen upon admission and as needed Provide education on smoking Provide education on ulcer and skin care Screen for HBO Treatment Activities: Consult for HBO : 12/30/2022 Skin care regimen initiated : 12/30/2022 Topical wound management initiated : 12/30/2022 Notes: Electronic Signature(s) Signed: 01/15/2023 9:47:17 AM By: Karie Schwalbe RN Entered By: Karie Schwalbe on 01/15/2023 06:35:19 Levi Barth ([id number]) [id number] Page 5 of 7 -------------------------------------------------------------------------------- Pain Assessment Details Patient Name: Date of Service: BRECCAN, GALANT 01/15/2023 9:00 A M Medical Record Number: [id number] Patient Account Number: [id number] Date of Birth/Sex: Treating RN: 10/11/1955 (67 y.o. M) Primary Care Avangelina Flight: [name] Clinician: Referring Ozell Juhasz: Treating [place] Lapaglia/Extender: Robley Fries in Treatment: 2  Active Problems Location of Pain Severity and Description of Pain Patient Has Paino No Site Locations Pain Management and Medication Current Pain Management: Electronic Signature(s) Signed: 01/15/2023 9:43:45 AM By: Dayton Scrape Entered By: Dayton Scrape on 01/15/2023 05:53:20 -------------------------------------------------------------------------------- Patient/Caregiver Education Details Patient Name: Date of Service: Levi Barth  10/3/2024andnbsp9:00 A M Medical Record Number: [id number] Patient Account Number: [id number] Date of Birth/Gender: Treating RN: [birth year] (67 y.o. Dianna Limbo Primary Care Physician: [name] Clinician: Referring Physician: Treating Physician/Extender: Robley Fries in Treatment: 2 Education Assessment Education Provided To: Patient Education Topics Provided Wound/Skin Impairment: Methods: Explain/Verbal Responses: State content correctly AUSTYN, SEIER ([id number]) [id number] Page 6 of 7 Electronic Signature(s) Signed: 01/15/2023 9:47:17 AM By: Karie Schwalbe RN Entered By: Karie Schwalbe on 01/15/2023 06:35:34 -------------------------------------------------------------------------------- Wound Assessment Details Patient Name: Date of Service: JADEN, ABREU 01/15/2023 9:00 A M Medical Record Number: [id number] Patient Account Number: [id number] Date of Birth/Sex: Treating RN: 06/28/1955 (67 y.o. M) Primary Care Caliope Ruppert: [name] Clinician: Referring Hawley Michel: Treating Salene Mohamud/Extender: Robley Fries in Treatment: 2 Wound Status Wound Number: 1 Primary Diabetic Wound/Ulcer of the Lower Extremity Etiology: Wound Location: Right, Medial, Plantar Foot Wound Open Wounding Event: Bump Status: Date Acquired: 09/13/2022 Comorbid Sleep Apnea, Angina, Coronary Artery Disease, Hypertension, Weeks Of Treatment: 2 History: Peripheral Venous Disease, Type II Diabetes, Neuropathy Clustered Wound: No Photos Wound Measurements Length: (cm) 0.5 Width: (cm) 0.5 Depth: (cm) 0.3 Area: (cm) 0.196 Volume: (cm) 0.059 % Reduction in Area: 61% % Reduction in Volume: 85.3% Epithelialization: Medium (34-66%) Tunneling: No Undermining: Yes Starting Position (o'clock): 8 Ending Position (o'clock): 9 Maximum Distance: (cm) 0.3 Wound Description Classification:  Grade 2 Wound Margin: Distinct, outline attached Exudate Amount: Medium Exudate Type: Serosanguineous Exudate Color: red, brown Foul Odor After Cleansing: No Slough/Fibrino Yes Wound Bed Granulation Amount: Small (1-33%) Exposed Structure Granulation Quality: Pink, Pale Fascia Exposed: No Necrotic Amount: Large (67-100%) Fat Layer (Subcutaneous Tissue) Exposed: Yes Necrotic Quality: Eschar, Adherent Slough Tendon Exposed: No Muscle Exposed: No Joint Exposed: No Bone Exposed: No [address] JAREB, RADONCIC K ([id number]) [id number] Page 7 of 7 No Abnormalities Noted: No No Abnormalities Noted: Yes Callus: Yes Temperature / Pain Scarring: No Temperature: No Abnormality Moisture No Abnormalities Noted: Yes Treatment Notes Wound #1 (Foot) Wound Laterality: Plantar, Right, Medial Cleanser Vashe 5.8 (oz) Discharge Instruction: Cleanse the wound with Vashe prior to applying a clean dressing using gauze sponges, not tissue or cotton balls. Peri-Wound Care Topical Gentamicin Discharge Instruction: As directed by physician Primary Dressing Maxorb Extra Ag+ Alginate Dressing, 4x4.75 (in/in) Discharge Instruction: Apply to wound bed as instructed Secondary Dressing Secured With Kerlix Roll Sterile, 4.5x3.1 (in/yd) Discharge Instruction: Secure with Kerlix as directed. 42M Medipore H Soft Cloth Surgical T ape, 4 x 10 (in/yd) Discharge Instruction: Secure with tape as directed. Compression Wrap Compression Stockings Add-Ons Electronic Signature(s) Signed: 01/15/2023 9:47:17 AM By: Karie Schwalbe RN Entered By: Karie Schwalbe on 01/15/2023 06:43:02 -------------------------------------------------------------------------------- Vitals Details Patient Name: Date of Service: Levi Barth. 01/15/2023 9:00 A M Medical Record Number: [id number] Patient Account Number: [id number] Date of Birth/Sex: Treating RN: 03/06/1956 (57 y.o.  M) Primary Care Puanani Gene: [name] Clinician: Referring Harvey Lingo: Treating Sibyl Mikula/Extender: Robley Fries in Treatment: 2 Vital Signs Time Taken: 08:52 Temperature (F): 97.9 Height (in): 68 Pulse (bpm): 85 Weight (lbs): 235 Respiratory Rate (breaths/min): 18 Body Mass Index (BMI): 35.7 Blood Pressure (mmHg): 131/85 Reference Range: 80 - 120 mg /  dl Electronic Signature(s) Signed: 01/15/2023 9:43:45 AM By: Dayton Scrape Entered By: Dayton Scrape on 01/15/2023 05:53:13

## 2023-01-16 ENCOUNTER — Other Ambulatory Visit: Payer: Self-pay | Admitting: Podiatry

## 2023-01-16 ENCOUNTER — Encounter: Payer: Self-pay | Admitting: Podiatry

## 2023-01-16 MED ORDER — PREGABALIN 150 MG PO CAPS: 150.0000 mg | ORAL_CAPSULE | Freq: Two times a day (BID) | ORAL | 0 refills | Status: DC

## 2023-01-16 NOTE — Progress Notes (Signed)
Levi, [name] ([id number]) [id number] Page 1 of 6 Visit Report for 01/15/2023 Debridement Details Patient Name: Date of Service: Levi, [name] 01/15/2023 9:00 A M Medical Record Number: [id number] Patient Account Number: [id number] Date of Birth/Sex: Treating RN: 04/29/1955 (67 y.o. M) Primary Care Provider: [name] Clinician: Referring Provider: Treating Provider/Extender: [name] in Treatment: 2 Debridement Performed for Assessment: Wound #1 Right,Medial,Plantar Foot Performed By: Physician [name]., MD The following information was scribed by: Karie Schwalbe The information was scribed for: Baltazar Najjar Debridement Type: Debridement Severity of Tissue Pre Debridement: Fat layer exposed Level of Consciousness (Pre-procedure): Awake and Alert Pre-procedure Verification/Time Out Yes - 09:17 Taken: Start Time: 09:17 Pain Control: Lidocaine 5% topical ointment Percent of Wound Bed Debrided: 100% T Area Debrided (cm): otal 0.2 Tissue and other material debrided: Viable, Non-Viable, Subcutaneous, Skin: Dermis , Skin: Epidermis Level: Skin/Subcutaneous Tissue Debridement Description: Excisional Instrument: Curette Bleeding: Minimum Hemostasis Achieved: Pressure End Time: 09:21 Procedural Pain: 0 Post Procedural Pain: 0 Response to Treatment: Procedure was tolerated well Level of Consciousness (Post- Awake and Alert procedure): Post Debridement Measurements of Total Wound Length: (cm) 0.5 Width: (cm) 0.5 Depth: (cm) 0.3 Volume: (cm) 0.059 Character of Wound/Ulcer Post Debridement: Improved Severity of Tissue Post Debridement: Fat layer exposed Post Procedure Diagnosis Same as Pre-procedure Electronic Signature(s) Signed: 01/15/2023 5:27:16 PM By: Baltazar Najjar MD Previous Signature: 01/15/2023 9:47:17 AM Version By: Karie Schwalbe RN Entered By: Baltazar Najjar on 01/15/2023  07:06:22 -------------------------------------------------------------------------------- HPI Details Patient Name: Date of Service: [name]. 01/15/2023 9:00 A M Medical Record Number: [id number] Patient Account Number: [id number] Date of Birth/Sex: Treating RN: [birth year] (24 y.o. M) Primary Care Provider: [name] Clinician: Referring Provider: Treating Provider/Extender: [name], [name] ([id number]) [phone].pdf Page 2 of 6 Weeks in Treatment: 2 History of Present Illness HPI Description: ADMISSION 12/30/2022 [name] is a 67 year old man with type 2 diabetes and a Charcot foot on the right. He has a history of bilateral foot wounds cared for by [facility]. He has also had osteomyelitis with a left second toe amputation on 09/08/2022. He tells me that he is been dealing with a wound on his plantar foot for the last month to 2. He has been using Silvadene with surrounding AandD. More recently Iodoflex and silver alginate. He was in a Psychologist, forensic however more recently has changed to a well-padded surgical shoe. He thinks since this change was made that things are actually better. He is retired, not tremendously active but not sedentary either. Past medical history type 2 diabetes with Charcot deformity, osteomyelitis of left second toe status post amputation ABIs at 1.23 on the left and 1.17 on the right. Also noted that he appears to have had normal arterial studies done by Dr. Kirke Corin in August we will need to check these next time. I do not believe he has significant arterial disease 9/24; this is a patient we admitted to clinic last week he has type 2 diabetes and a Charcot foot on the right. He was put in a Pegasys shoe at podiatry before he came here and this seems to have really helped things. Last week I debrided this we used silver alginate. Because of the amount of debris over the  wound surface I cultured the wound and it grew abundant Morganella. Also Enterococcus. 10/3; patient has a small wound on the lateral part of her right foot in the setting of setting of type 2 diabetes  and a Charcot deformity. He has a Pegasys shoe with foam cover. I am uncertain about how much he is offloading this. A culture of this grew abundant Morganella also Enterococcus we have been using topical gentamicin and silver alginate on the wound there is been no evidence of systemic infection Electronic Signature(s) Signed: 01/15/2023 5:27:16 PM By: Baltazar Najjar MD Entered By: Baltazar Najjar on 01/15/2023 07:07:17 -------------------------------------------------------------------------------- Physical Exam Details Patient Name: Date of Service: [name] 01/15/2023 9:00 A M Medical Record Number: [id number] Patient Account Number: [id number] Date of Birth/Sex: Treating RN: 1956-02-11 (9 y.o. M) Primary Care Provider: [name] Clinician: Referring Provider: Treating Provider/Extender: [name] in Treatment: 2 Constitutional Sitting or standing Blood Pressure is within target range for patient.. Pulse regular and within target range for patient.Marland Kitchen Respirations regular, non-labored and within target range.. Temperature is normal and within the target range for the patient.Marland Kitchen Appears in no distress. Notes Wound exam; medial part of the right Charcot foot. Again undermining small punched out wound. I used a #3 curette to remove the undermining area and some debris on the surface of the wound like last week this cleans up quite nicely nice vibrant granulated tissue no evidence of infection Electronic Signature(s) Signed: 01/15/2023 5:27:16 PM By: Baltazar Najjar MD Entered By: Baltazar Najjar on 01/15/2023 07:11:38 -------------------------------------------------------------------------------- Physician Orders Details Patient Name:  Date of Service: [name]. 01/15/2023 9:00 A M Medical Record Number: [id number] Patient Account Number: [id number] Date of Birth/Sex: Treating RN: 1955-08-02 (67 y.o. [name] Primary Care Provider: [name] Clinician: Referring Provider: Treating Provider/Extender: Kailash, Hinze, Comfrey [name] ([id number]) [phone].pdf Page 3 of 6 Weeks in Treatment: 2 Verbal / Phone Orders: No Diagnosis Coding Follow-up Appointments ppointment in 1 week. - Thursday Dr. Leanord Hawking room 9 Return A ppointment in 2 weeks. - Dr. Leanord Hawking (front office schedule) Return A Return appointment in 3 weeks. - Dr .Leanord Hawking (front office schedule) Other: - Gentamicin ointment pick up from pharmacy. Anesthetic (In clinic) Topical Lidocaine 4% applied to wound bed Bathing/ Shower/ Hygiene May shower with protection but do not get wound dressing(s) wet. Protect dressing(s) with water repellant cover (for example, large plastic bag) or a cast cover and may then take shower. Off-Loading Wound #1 Right,Medial,Plantar Foot Open toe surgical shoe to: - with peg assist. Other: - Alleviate as much pressure to the area as possible. Wound Treatment Wound #1 - Foot Wound Laterality: Plantar, Right, Medial Cleanser: Vashe 5.8 (oz) 1 x Per Day/30 Days Discharge Instructions: Cleanse the wound with Vashe prior to applying a clean dressing using gauze sponges, not tissue or cotton balls. Topical: Gentamicin 1 x Per Day/30 Days Discharge Instructions: As directed by physician Prim Dressing: Maxorb Extra Ag+ Alginate Dressing, 4x4.75 (in/in) 1 x Per Day/30 Days ary Discharge Instructions: Apply to wound bed as instructed Secured With: Kerlix Roll Sterile, 4.5x3.1 (in/yd) 1 x Per Day/30 Days Discharge Instructions: Secure with Kerlix as directed. Secured With: 85M Medipore H Soft Cloth Surgical T ape, 4 x 10 (in/yd) 1 x Per Day/30 Days Discharge  Instructions: Secure with tape as directed. Electronic Signature(s) Signed: 01/15/2023 9:47:17 AM By: Karie Schwalbe RN Signed: 01/15/2023 5:27:16 PM By: Baltazar Najjar MD Entered By: Karie Schwalbe on 01/15/2023 06:35:07 -------------------------------------------------------------------------------- Problem List Details Patient Name: Date of Service: [name], [name] 01/15/2023 9:00 A M Medical Record Number: [id number] Patient Account Number: [id number] Date of Birth/Sex: Treating RN: Nov 10, 1955 (67 y.o. M) Primary Care Provider: Orson Aloe,  Christiane Ha Other Clinician: Referring Provider: Treating Provider/Extender: [name] in Treatment: 2 Active Problems ICD-10 Encounter Code Description Active Date MDM Diagnosis E11.621 Type 2 diabetes mellitus with foot ulcer 12/30/2022 No Yes L97.518 Non-pressure chronic ulcer of other part of right foot with other specified 12/30/2022 No Yes severity LENO, MATHES ([id number]) [phone].pdf Page 4 of 6 [phone] Charcot's joint, right ankle and foot 12/30/2022 No Yes Inactive Problems Resolved Problems Electronic Signature(s) Signed: 01/15/2023 5:27:16 PM By: Baltazar Najjar MD Entered By: Baltazar Najjar on 01/15/2023 07:04:25 -------------------------------------------------------------------------------- Progress Note Details Patient Name: Date of Service: [name]. 01/15/2023 9:00 A M Medical Record Number: [id number] Patient Account Number: [id number] Date of Birth/Sex: Treating RN: [birth year] (53 y.o. M) Primary Care Provider: [name] Clinician: Referring Provider: Treating Provider/Extender: [name] in Treatment: 2 Subjective History of Present Illness (HPI) ADMISSION 12/30/2022 Mr. Hazelrigg is a 67 year old man with type 2 diabetes and a Charcot foot on the right. He has a history of bilateral foot wounds cared for by  [facility]. He has also had osteomyelitis with a left second toe amputation on 09/08/2022. He tells me that he is been dealing with a wound on his plantar foot for the last month to 2. He has been using Silvadene with surrounding AandD. More recently Iodoflex and silver alginate. He was in a Psychologist, forensic however more recently has changed to a well-padded surgical shoe. He thinks since this change was made that things are actually better. He is retired, not tremendously active but not sedentary either. Past medical history type 2 diabetes with Charcot deformity, osteomyelitis of left second toe status post amputation ABIs at 1.23 on the left and 1.17 on the right. Also noted that he appears to have had normal arterial studies done by Dr. Kirke Corin in August we will need to check these next time. I do not believe he has significant arterial disease 9/24; this is a patient we admitted to clinic last week he has type 2 diabetes and a Charcot foot on the right. He was put in a Pegasys shoe at podiatry before he came here and this seems to have really helped things. Last week I debrided this we used silver alginate. Because of the amount of debris over the wound surface I cultured the wound and it grew abundant Morganella. Also Enterococcus. 10/3; patient has a small wound on the lateral part of her right foot in the setting of setting of type 2 diabetes and a Charcot deformity. He has a Pegasys shoe with foam cover. I am uncertain about how much he is offloading this. A culture of this grew abundant Morganella also Enterococcus we have been using topical gentamicin and silver alginate on the wound there is been no evidence of systemic infection Objective Constitutional Sitting or standing Blood Pressure is within target range for patient.. Pulse regular and within target range for patient.Marland Kitchen Respirations regular, non-labored and within target range.. Temperature is normal and within the target range  for the patient.Marland Kitchen Appears in no distress. Vitals Time Taken: 8:52 AM, Height: 68 in, Weight: 235 lbs, BMI: 35.7, Temperature: 97.9 F, Pulse: 85 bpm, Respiratory Rate: 18 breaths/min, Blood Pressure: 131/85 mmHg. General Notes: Wound exam; medial part of the right Charcot foot. Again undermining small punched out wound. I used a #3 curette to remove the undermining area and some debris on the surface of the wound like last week this cleans up quite nicely nice vibrant granulated tissue no  evidence of infection Levi, [name] ([id number]) [id number] Page 5 of 6 Integumentary (Hair, Skin) Wound #1 status is Open. Original cause of wound was Bump. The date acquired was: 09/13/2022. The wound has been in treatment 2 weeks. The wound is located on the Right,Medial,Plantar Foot. The wound measures 0.5cm length x 0.5cm width x 0.3cm depth; 0.196cm^2 area and 0.059cm^3 volume. There is Fat Layer (Subcutaneous Tissue) exposed. There is no tunneling noted, however, there is undermining starting at 8:00 and ending at 9:00 with a maximum distance of 0.3cm. There is a medium amount of serosanguineous drainage noted. The wound margin is distinct with the outline attached to the wound base. There is small (1-33%) pink, pale granulation within the wound bed. There is a large (67-100%) amount of necrotic tissue within the wound bed including Eschar and Adherent Slough. The periwound skin appearance had no abnormalities noted for moisture. The periwound skin appearance had no abnormalities noted for color. The periwound skin appearance exhibited: Callus. The periwound skin appearance did not exhibit: Scarring. Periwound temperature was noted as No Abnormality. Assessment Active Problems ICD-10 Type 2 diabetes mellitus with foot ulcer Non-pressure chronic ulcer of other part of right foot with other specified severity Charcot's joint, right ankle and foot Procedures Wound  #1 Pre-procedure diagnosis of Wound #1 is a Diabetic Wound/Ulcer of the Lower Extremity located on the Right,Medial,Plantar Foot .Severity of Tissue Pre Debridement is: Fat layer exposed. There was a Excisional Skin/Subcutaneous Tissue Debridement with a total area of 0.2 sq cm performed by [name]., MD. With the following instrument(s): Curette to remove Viable and Non-Viable tissue/material. Material removed includes Subcutaneous Tissue, Skin: Dermis, and Skin: Epidermis after achieving pain control using Lidocaine 5% topical ointment. No specimens were taken. A time out was conducted at 09:17, prior to the start of the procedure. A Minimum amount of bleeding was controlled with Pressure. The procedure was tolerated well with a pain level of 0 throughout and a pain level of 0 following the procedure. Post Debridement Measurements: 0.5cm length x 0.5cm width x 0.3cm depth; 0.059cm^3 volume. Character of Wound/Ulcer Post Debridement is improved. Severity of Tissue Post Debridement is: Fat layer exposed. Post procedure Diagnosis Wound #1: Same as Pre-Procedure Plan Follow-up Appointments: Return Appointment in 1 week. - Thursday Dr. Leanord Hawking room 9 Return Appointment in 2 weeks. - Dr. Leanord Hawking (front office schedule) Return appointment in 3 weeks. - Dr .Leanord Hawking (front office schedule) Other: - Gentamicin ointment pick up from pharmacy. Anesthetic: (In clinic) Topical Lidocaine 4% applied to wound bed Bathing/ Shower/ Hygiene: May shower with protection but do not get wound dressing(s) wet. Protect dressing(s) with water repellant cover (for example, large plastic bag) or a cast cover and may then take shower. Off-Loading: Wound #1 Right,Medial,Plantar Foot: Open toe surgical shoe to: - with peg assist. Other: - Alleviate as much pressure to the area as possible. WOUND #1: - Foot Wound Laterality: Plantar, Right, Medial Cleanser: Vashe 5.8 (oz) 1 x Per Day/30 Days Discharge Instructions:  Cleanse the wound with Vashe prior to applying a clean dressing using gauze sponges, not tissue or cotton balls. Topical: Gentamicin 1 x Per Day/30 Days Discharge Instructions: As directed by physician Prim Dressing: Maxorb Extra Ag+ Alginate Dressing, 4x4.75 (in/in) 1 x Per Day/30 Days ary Discharge Instructions: Apply to wound bed as instructed Secured With: Kerlix Roll Sterile, 4.5x3.1 (in/yd) 1 x Per Day/30 Days Discharge Instructions: Secure with Kerlix as directed. Secured With: 78M Medipore H Soft Cloth Surgical T ape, 4  x 10 (in/yd) 1 x Per Day/30 Days Discharge Instructions: Secure with tape as directed. 1. I have continued with the gentamicin and silver alginate 2. Same shoe 3. I need to see some improvement by next week if not I will be looking at a total contact cast care. Electronic Signature(s) Signed: 01/15/2023 5:27:16 PM By: Baltazar Najjar MD Entered By: Baltazar Najjar on 01/15/2023 07:12:13 [name] ([id number]) [id number] Page 6 of 6 -------------------------------------------------------------------------------- SuperBill Details Patient Name: Date of Service: [name], [name] 01/15/2023 Medical Record Number: [id number] Patient Account Number: [id number] Date of Birth/Sex: Treating RN: Sep 16, 1955 (10 y.o. M) Primary Care Provider: [name] Clinician: Referring Provider: Treating Provider/Extender: [name] in Treatment: 2 Diagnosis Coding ICD-10 Codes Code Description [phone] Type 2 diabetes mellitus with foot ulcer L97.518 Non-pressure chronic ulcer of other part of right foot with other specified severity M14.671 Charcot's joint, right ankle and foot Facility Procedures : CPT4 Code: [id number] Description: 11042 - DEB SUBQ TISSUE 20 SQ CM/< ICD-10 Diagnosis Description E11.621 Type 2 diabetes mellitus with foot ulcer L97.518 Non-pressure chronic ulcer of other part of right  foot with other specified sev Modifier: erity Quantity: 1 Physician Procedures : CPT4 Code Description Modifier [id number] 11042 - WC PHYS SUBQ TISS 20 SQ CM ICD-10 Diagnosis Description E11.621 Type 2 diabetes mellitus with foot ulcer L97.518 Non-pressure chronic ulcer of other part of right foot with other specified severity Quantity: 1 Electronic Signature(s) Signed: 01/15/2023 5:27:16 PM By: Baltazar Najjar MD Entered By: Baltazar Najjar on 01/15/2023 07:12:26

## 2023-01-17 DIAGNOSIS — G4733 Obstructive sleep apnea (adult) (pediatric): Secondary | ICD-10-CM | POA: Diagnosis not present

## 2023-01-19 DIAGNOSIS — G4733 Obstructive sleep apnea (adult) (pediatric): Secondary | ICD-10-CM | POA: Diagnosis not present

## 2023-01-22 ENCOUNTER — Encounter (HOSPITAL_BASED_OUTPATIENT_CLINIC_OR_DEPARTMENT_OTHER): Payer: Medicare PPO | Admitting: Internal Medicine

## 2023-01-22 DIAGNOSIS — I10 Essential (primary) hypertension: Secondary | ICD-10-CM | POA: Diagnosis not present

## 2023-01-22 DIAGNOSIS — L97512 Non-pressure chronic ulcer of other part of right foot with fat layer exposed: Secondary | ICD-10-CM | POA: Diagnosis not present

## 2023-01-22 DIAGNOSIS — Z89422 Acquired absence of other left toe(s): Secondary | ICD-10-CM | POA: Diagnosis not present

## 2023-01-22 DIAGNOSIS — L97518 Non-pressure chronic ulcer of other part of right foot with other specified severity: Secondary | ICD-10-CM | POA: Diagnosis not present

## 2023-01-22 DIAGNOSIS — E11621 Type 2 diabetes mellitus with foot ulcer: Secondary | ICD-10-CM | POA: Diagnosis not present

## 2023-01-22 DIAGNOSIS — E11610 Type 2 diabetes mellitus with diabetic neuropathic arthropathy: Secondary | ICD-10-CM | POA: Diagnosis not present

## 2023-01-22 DIAGNOSIS — E1140 Type 2 diabetes mellitus with diabetic neuropathy, unspecified: Secondary | ICD-10-CM | POA: Diagnosis not present

## 2023-01-22 DIAGNOSIS — Z8619 Personal history of other infectious and parasitic diseases: Secondary | ICD-10-CM | POA: Diagnosis not present

## 2023-01-22 NOTE — Progress Notes (Addendum)
CHIRON, CAMPIONE K ([id number]) [id number] Page 1 of 7 Visit Report for 01/22/2023 Arrival Information Details Patient Name: Date of Service: [name], [name] 01/22/2023 2:00 PM Medical Record Number: [id number] Patient Account Number: [id number] Date of Birth/Sex: Treating RN: November 01, 1955 (67 y.o. [name] Primary Care Ishanvi Mcquitty: [name] Clinician: Referring Davarius Ridener: Treating Miel Wisener/Extender: Robley Fries in Treatment: 3 Visit Information History Since Last Visit Added or deleted any medications: No Patient Arrived: Ambulatory Any new allergies or adverse reactions: No Arrival Time: 14:27 Had a fall or experienced change in No Accompanied By: self activities of daily living that may affect Transfer Assistance: None risk of falls: Patient Identification Verified: Yes Signs or symptoms of abuse/neglect since No Secondary Verification Process Completed: Yes last visito Patient Requires Transmission-Based Precautions: No Hospitalized since last visit: No Patient Has Alerts: No Implantable device outside of the clinic No excluding cellular tissue based products placed in the center since last visit: Has Dressing in Place as Prescribed: Yes Has Footwear/Offloading in Place as Yes Prescribed: Right: Surgical Shoe with Pressure Relief Insole Pain Present Now: No Electronic Signature(s) Signed: 01/22/2023 4:30:01 PM By: Shawn Stall RN, BSN Entered By: Shawn Stall on 01/22/2023 14:27:32 -------------------------------------------------------------------------------- Encounter Discharge Information Details Patient Name: Date of Service: [name]. 01/22/2023 2:00 PM Medical Record Number: [id number] Patient Account Number: [id number] Date of Birth/Sex: Treating RN: 07-04-1955 (67 y.o. [name] Primary Care Karmella Bouvier: [name] Clinician: Referring Iyan Flett: Treating  Cesario Weidinger/Extender: Robley Fries in Treatment: 3 Encounter Discharge Information Items Post Procedure Vitals Discharge Condition: Stable Temperature (F): 98.3 Ambulatory Status: Ambulatory Pulse (bpm): 73 Discharge Destination: Home Respiratory Rate (breaths/min): 20 Transportation: Private Auto Blood Pressure (mmHg): 133/81 Accompanied By: self Schedule Follow-up Appointment: Yes Clinical Summary of Care: Electronic Signature(s) Signed: 01/22/2023 4:30:01 PM By: Shawn Stall RN, BSN Entered By: Shawn Stall on 01/22/2023 14:42:32 [name] ([id number]) [id number] Page 2 of 7 -------------------------------------------------------------------------------- Lower Extremity Assessment Details Patient Name: Date of Service: Levi, [name] 01/22/2023 2:00 PM Medical Record Number: [id number] Patient Account Number: [id number] Date of Birth/Sex: Treating RN: [birth year] (67 y.o. [name] Primary Care Staley Lunz: [name] Clinician: Referring Tayjon Halladay: Treating Pearlina Friedly/Extender: Robley Fries in Treatment: 3 Edema Assessment Assessed: Kyra Searles: No] Franne Forts: Yes] Edema: [Left: Ye] [Right: s] Calf Left: Right: Point of Measurement: From Medial Instep 34 cm Ankle Left: Right: Point of Measurement: From Medial Instep 26 cm Vascular Assessment Pulses: Dorsalis Pedis Palpable: [Right:Yes] Extremity colors, hair growth, and conditions: Extremity Color: [Right:Normal] Hair Growth on Extremity: [Right:Yes] Temperature of Extremity: [Right:Warm] Capillary Refill: [Right:< 3 seconds] Dependent Rubor: [Right:No] Blanched when Elevated: [Right:No No] Toe Nail Assessment Left: Right: Thick: No Discolored: No Deformed: No Improper Length and Hygiene: No Electronic Signature(s) Signed: 01/22/2023 4:30:01 PM By: Shawn Stall RN, BSN Entered By: Shawn Stall on 01/22/2023  14:28:16 -------------------------------------------------------------------------------- Multi Wound Chart Details Patient Name: Date of Service: [name] 01/22/2023 2:00 PM Medical Record Number: [id number] Patient Account Number: [id number] Date of Birth/Sex: Treating RN: 04-08-1956 (27 y.o. M) Primary Care Korde Jeppsen: [name] Clinician: Referring Brenda Samano: Treating Graison Leinberger/Extender: Robley Fries in Treatment: 3 Vital Signs KAID, SEEBERGER ([id number]) [id number] Page 3 of 7 Height(in): 68 Pulse(bpm): 73 Weight(lbs): 235 Blood Pressure(mmHg): 133/81 Body Mass Index(BMI): 35.7 Temperature(F): 98.5 Respiratory Rate(breaths/min): 20 [1:Photos:] [N/A:N/A] Right, Medial, Plantar Foot N/A N/A Wound Location: Bump N/A N/A Wounding Event: Diabetic Wound/Ulcer of the Lower N/A N/A Primary Etiology:  Extremity Sleep Apnea, Angina, Coronary Artery N/A N/A Comorbid History: Disease, Hypertension, Peripheral Venous Disease, Type II Diabetes, Neuropathy 09/13/2022 N/A N/A Date Acquired: 3 N/A N/A Weeks of Treatment: Open N/A N/A Wound Status: No N/A N/A Wound Recurrence: 0.9x0.7x0.4 N/A N/A Measurements L x W x D (cm) 0.495 N/A N/A A (cm) : rea 0.198 N/A N/A Volume (cm) : 1.60% N/A N/A % Reduction in A rea: 50.70% N/A N/A % Reduction in Volume: Grade 2 N/A N/A Classification: Medium N/A N/A Exudate A mount: Serosanguineous N/A N/A Exudate Type: red, brown N/A N/A Exudate Color: Distinct, outline attached N/A N/A Wound Margin: None Present (0%) N/A N/A Granulation A mount: None Present (0%) N/A N/A Necrotic A mount: Fat Layer (Subcutaneous Tissue): Yes N/A N/A Exposed Structures: Fascia: No Tendon: No Muscle: No Joint: No Bone: No None N/A N/A Epithelialization: Debridement - Excisional N/A N/A Debridement: Pre-procedure Verification/Time Out 14:30 N/A N/A Taken: Lidocaine  4% Topical Solution N/A N/A Pain Control: Callus, Subcutaneous, Slough N/A N/A Tissue Debrided: Skin/Subcutaneous Tissue N/A N/A Level: 0.49 N/A N/A Debridement A (sq cm): rea Curette N/A N/A Instrument: Minimum N/A N/A Bleeding: Pressure N/A N/A Hemostasis A chieved: 0 N/A N/A Procedural Pain: 0 N/A N/A Post Procedural Pain: Procedure was tolerated well N/A N/A Debridement Treatment Response: 0.9x0.7x0.4 N/A N/A Post Debridement Measurements L x W x D (cm) 0.198 N/A N/A Post Debridement Volume: (cm) Callus: Yes N/A N/A Periwound Skin Texture: Scarring: No Maceration: No N/A N/A Periwound Skin Moisture: Dry/Scaly: No Hemosiderin Staining: Yes N/A N/A Periwound Skin Color: No Abnormality N/A N/A Temperature: dried blood noted in wound bed- unable N/A N/A Assessment Notes: to visualize wound bed. Debridement N/A N/A Procedures Performed: Treatment Notes Wound #1 (Foot) Wound Laterality: Plantar, Right, Medial Cleanser Vashe 5.8 (oz) Discharge Instruction: Cleanse the wound with Vashe prior to applying a clean dressing using gauze sponges, not tissue or cotton balls. JAXTYN, LINVILLE K ([id number]) [id number] Page 4 of 7 Peri-Wound Care Topical Primary Dressing Promogran Prisma Matrix, 4.34 (sq in) (silver collagen) Discharge Instruction: Moisten collagen ([place] JELLY) hydrogel Secondary Dressing Optifoam Non-Adhesive Dressing, 4x4 in Discharge Instruction: Apply foam donut over the wound to aid in offloading Woven Gauze Sponges 2x2 in Discharge Instruction: Apply over primary dressing as directed. Secured With 23M Medipore H Soft Cloth Surgical T ape, 4 x 10 (in/yd) Discharge Instruction: Secure with tape as directed. Compression Wrap Compression Stockings Add-Ons Electronic Signature(s) Signed: 01/22/2023 4:27:46 PM By: Baltazar Najjar MD Entered By: Baltazar Najjar on 01/22/2023  14:44:10 -------------------------------------------------------------------------------- Multi-Disciplinary Care Plan Details Patient Name: Date of Service: TRAYLON, SCHIMMING 01/22/2023 2:00 PM Medical Record Number: [id number] Patient Account Number: [id number] Date of Birth/Sex: Treating RN: 06/08/1955 (66 y.o. [name] Primary Care Julliette Frentz: [name] Clinician: Referring Jeymi Hepp: Treating Shonta Bourque/Extender: Robley Fries in Treatment: 3 Active Inactive Electronic Signature(s) Signed: 01/28/2023 3:07:10 PM By: Shawn Stall RN, BSN Previous Signature: 01/22/2023 4:30:01 PM Version By: Shawn Stall RN, BSN Entered By: Shawn Stall on 01/28/2023 08:50:16 -------------------------------------------------------------------------------- Pain Assessment Details Patient Name: Date of Service: DEUNTE, BLEDSOE 01/22/2023 2:00 PM Medical Record Number: [id number] Patient Account Number: [id number] Date of Birth/Sex: Treating RN: Jun 02, 1955 (67 y.o. [name] Primary Care Olliver Boyadjian: [name] Clinician: Referring Daisy Lites: Treating Koren Sermersheim/Extender: Robley Fries in Treatment: 3 Active Problems Location of Pain Severity and Description of Pain KARSYN, ROCHIN ([id number]) [phone].pdf Page 5 of 7 Patient Has Paino No Site Locations Pain Management and Medication Current  Pain Management: Electronic Signature(s) Signed: 01/22/2023 4:30:01 PM By: Shawn Stall RN, BSN Entered By: Shawn Stall on 01/22/2023 14:27:46 -------------------------------------------------------------------------------- Patient/Caregiver Education Details Patient Name: Date of Service: [name] 10/10/2024andnbsp2:00 PM Medical Record Number: [id number] Patient Account Number: [id number] Date of Birth/Gender: Treating RN: 01-07-1956 (67 y.o. [name] Primary Care  Physician: [name] Clinician: Referring Physician: Treating Physician/Extender: Robley Fries in Treatment: 3 Education Assessment Education Provided To: Patient Education Topics Provided Wound/Skin Impairment: Handouts: Caring for Your Ulcer Methods: Explain/Verbal Responses: Reinforcements needed Electronic Signature(s) Signed: 01/22/2023 4:30:01 PM By: Shawn Stall RN, BSN Entered By: Shawn Stall on 01/22/2023 14:29:52 -------------------------------------------------------------------------------- Wound Assessment Details Patient Name: Date of Service: [name] 01/22/2023 2:00 PM Medical Record Number: [id number] Patient Account Number: [id number] ADON, GEHLHAUSEN ([id number]) [phone].pdf Page 6 of 7 Date of Birth/Sex: Treating RN: Oct 19, 1955 (67 y.o. Harlon Flor, Millard.Loa Primary Care Walter Grima: Other Clinician: Eleanora Neighbor Referring Kenyah Luba: Treating Helem Reesor/Extender: Robley Fries in Treatment: 3 Wound Status Wound Number: 1 Primary Diabetic Wound/Ulcer of the Lower Extremity Etiology: Wound Location: Right, Medial, Plantar Foot Wound Open Wounding Event: Bump Status: Date Acquired: 09/13/2022 Comorbid Sleep Apnea, Angina, Coronary Artery Disease, Hypertension, Weeks Of Treatment: 3 History: Peripheral Venous Disease, Type II Diabetes, Neuropathy Clustered Wound: No Photos Wound Measurements Length: (cm) 0.9 Width: (cm) 0.7 Depth: (cm) 0.4 Area: (cm) 0.495 Volume: (cm) 0.198 % Reduction in Area: 1.6% % Reduction in Volume: 50.7% Epithelialization: None Tunneling: No Undermining: No Wound Description Classification: Grade 2 Wound Margin: Distinct, outline attached Exudate Amount: Medium Exudate Type: Serosanguineous Exudate Color: red, brown Foul Odor After Cleansing: No Slough/Fibrino No Wound Bed Granulation Amount: None Present (0%)  Exposed Structure Necrotic Amount: None Present (0%) Fascia Exposed: No Fat Layer (Subcutaneous Tissue) Exposed: Yes Tendon Exposed: No Muscle Exposed: No Joint Exposed: No Bone Exposed: No Periwound Skin Texture Texture Color No Abnormalities Noted: No No Abnormalities Noted: Yes Callus: Yes Temperature / Pain Scarring: No Temperature: No Abnormality Moisture No Abnormalities Noted: Yes Assessment Notes dried blood noted in wound bed- unable to visualize wound bed. Electronic Signature(s) Signed: 01/22/2023 4:30:01 PM By: Shawn Stall RN, BSN Entered By: Shawn Stall on 01/22/2023 14:31:15 [name] ([id number]) [id number].pdf Page 7 of 7 -------------------------------------------------------------------------------- Vitals Details Patient Name: Date of Service: EZANA, HUBBERT 01/22/2023 2:00 PM Medical Record Number: [id number] Patient Account Number: [id number] Date of Birth/Sex: Treating RN: 1956-01-18 (67 y.o. Harlon Flor, Yvonne Kendall Primary Care Lucresha Dismuke: [name] Clinician: Referring Daizee Firmin: Treating Yanisa Goodgame/Extender: Robley Fries in Treatment: 3 Vital Signs Time Taken: 14:27 Temperature (F): 98.5 Height (in): 68 Pulse (bpm): 73 Weight (lbs): 235 Respiratory Rate (breaths/min): 20 Body Mass Index (BMI): 35.7 Blood Pressure (mmHg): 133/81 Reference Range: 80 - 120 mg / dl Electronic Signature(s) Signed: 01/22/2023 4:30:01 PM By: Shawn Stall RN, BSN Entered By: Shawn Stall on 01/22/2023 14:27:43

## 2023-01-22 NOTE — Progress Notes (Signed)
VANN, OKERLUND ([id number]) [id number] Page 1 of 6 Visit Report for 01/22/2023 Debridement Details Patient Name: Date of Service: [name], [name] 01/22/2023 2:00 PM Medical Record Number: [id number] Patient Account Number: [id number] Date of Birth/Sex: Treating RN: January 07, 1956 (67 y.o. M) Primary Care Provider: [name] Clinician: Referring Provider: Treating Provider/Extender: Robley Fries in Treatment: 3 Debridement Performed for Assessment: Wound #1 Right,Medial,Plantar Foot Performed By: Physician Maxwell Caul., MD The following information was scribed by: Shawn Stall The information was scribed for: Baltazar Najjar Debridement Type: Debridement Severity of Tissue Pre Debridement: Fat layer exposed Level of Consciousness (Pre-procedure): Awake and Alert Pre-procedure Verification/Time Out Yes - 14:30 Taken: Start Time: 14:31 Pain Control: Lidocaine 4% T opical Solution Percent of Wound Bed Debrided: 100% T Area Debrided (cm): otal 0.49 Tissue and other material debrided: Viable, Non-Viable, Callus, Slough, Subcutaneous, Skin: Dermis , Skin: Epidermis, Slough Level: Skin/Subcutaneous Tissue Debridement Description: Excisional Instrument: Curette Bleeding: Minimum Hemostasis Achieved: Pressure End Time: 14:36 Procedural Pain: 0 Post Procedural Pain: 0 Response to Treatment: Procedure was tolerated well Level of Consciousness (Post- Awake and Alert procedure): Post Debridement Measurements of Total Wound Length: (cm) 0.9 Width: (cm) 0.7 Depth: (cm) 0.4 Volume: (cm) 0.198 Character of Wound/Ulcer Post Debridement: Improved Severity of Tissue Post Debridement: Fat layer exposed Post Procedure Diagnosis Same as Pre-procedure Electronic Signature(s) Signed: 01/22/2023 4:27:46 PM By: Baltazar Najjar MD Entered By: Baltazar Najjar on 01/22/2023  14:45:18 -------------------------------------------------------------------------------- HPI Details Patient Name: Date of Service: Levi Barth. 01/22/2023 2:00 PM Medical Record Number: [id number] Patient Account Number: [id number] Date of Birth/Sex: Treating RN: 1956/03/26 (67 y.o. M) Primary Care Provider: [name] Clinician: Referring Provider: Treating Provider/Extender: Robley Fries in Treatment: 3 [name], [name] ([id number]) [id number] Page 2 of 6 History of Present Illness HPI Description: ADMISSION 12/30/2022 [name] is a 67 year old man with type 2 diabetes and a Charcot foot on the right. He has a history of bilateral foot wounds cared for by [facility]. He has also had osteomyelitis with a left second toe amputation on 09/08/2022. He tells me that he is been dealing with a wound on his plantar foot for the last month to 2. He has been using Silvadene with surrounding AandD. More recently Iodoflex and silver alginate. He was in a Psychologist, forensic however more recently has changed to a well-padded surgical shoe. He thinks since this change was made that things are actually better. He is retired, not tremendously active but not sedentary either. Past medical history type 2 diabetes with Charcot deformity, osteomyelitis of left second toe status post amputation ABIs at 1.23 on the left and 1.17 on the right. Also noted that he appears to have had normal arterial studies done by Dr. Kirke Corin in August we will need to check these next time. I do not believe he has significant arterial disease 9/24; this is a patient we admitted to clinic last week he has type 2 diabetes and a Charcot foot on the right. He was put in a Pegasys shoe at podiatry before he came here and this seems to have really helped things. Last week I debrided this we used silver alginate. Because of the amount of debris over the  wound surface I cultured the wound and it grew abundant Morganella. Also Enterococcus. 10/3; patient has a small wound on the lateral part of her right foot in the setting of setting of type 2 diabetes and a Charcot deformity. He has a Pegasys  shoe with foam cover. I am uncertain about how much he is offloading this. A culture of this grew abundant Morganella also Enterococcus we have been using topical gentamicin and silver alginate on the wound there is been no evidence of systemic infection 10/10; small wound on the lateral part of the right foot at the setting of the base of the fifth metatarsal he is a type II diabetic with Charcot deformity. He has been using a Pegasys shoe. We have been using topical gentamicin and silver alginate. There is no evidence of systemic infection. A culture previously grew Morganella and Enterococcus we used topical gentamicin for this. He tells [place] that he has a knee scooter. I had previously said that I would do a total contact cast this week. He is not prepared for this Electronic Signature(s) Signed: 01/22/2023 4:27:46 PM By: Baltazar Najjar MD Entered By: Baltazar Najjar on 01/22/2023 14:46:34 -------------------------------------------------------------------------------- Physical Exam Details Patient Name: Date of Service: [name], [name] 01/22/2023 2:00 PM Medical Record Number: [id number] Patient Account Number: [id number] Date of Birth/Sex: Treating RN: 1955-11-07 (67 y.o. M) Primary Care Provider: [name] Clinician: Referring Provider: Treating Provider/Extender: Robley Fries in Treatment: 3 Constitutional Sitting or standing Blood Pressure is within target range for patient.. Pulse regular and within target range for patient.Marland Kitchen Respirations regular, non-labored and within target range.. Temperature is normal and within the target range for the patient.Marland Kitchen Appears in no distress. Notes Wound exam;  lateral part of the right foot Charcot deformity. Small punched out wound thick callus dry skin around the wound I used a #3 curette to remove this and a considerable amount of subcutaneous debris hemostasis with direct pressure fortunately this does not probe to bone it does not look like there is any evidence of surrounding infection Electronic Signature(s) Signed: 01/22/2023 4:27:46 PM By: Baltazar Najjar MD Entered By: Baltazar Najjar on 01/22/2023 14:47:32 Physician Orders Details -------------------------------------------------------------------------------- Levi Barth ([id number]) [id number] Page 3 of 6 Patient Name: Date of Service: [name], [name] 01/22/2023 2:00 PM Medical Record Number: [id number] Patient Account Number: [id number] Date of Birth/Sex: Treating RN: [birth year] (67 y.o. [name] Primary Care Provider: [name] Clinician: Referring Provider: Treating Provider/Extender: Robley Fries in Treatment: 3 The following information was scribed by: Shawn Stall The information was scribed for: Baltazar Najjar Verbal / Phone Orders: No Diagnosis Coding ICD-10 Coding Code Description E11.621 Type 2 diabetes mellitus with foot ulcer L97.518 Non-pressure chronic ulcer of other part of right foot with other specified severity M14.671 Charcot's joint, right ankle and foot Follow-up Appointments ppointment in 1 week. - Thursday Dr. Leanord Hawking room 9 01/29/2023 0900 Return A ppointment in 2 weeks. - Dr. Leanord Hawking 02/05/2023 Return A Return appointment in 3 weeks. - Dr .Leanord Hawking (front office schedule) Return appointment in 1 month. - Dr .Leanord Hawking (front office schedule) Other: - Stop gentamicin start using knee scooter all the time. Anesthetic (In clinic) Topical Lidocaine 4% applied to wound bed Bathing/ Shower/ Hygiene May shower with protection but do not get wound dressing(s) wet. Protect  dressing(s) with water repellant cover (for example, large plastic bag) or a cast cover and may then take shower. Off-Loading Wound #1 Right,Medial,Plantar Foot Open toe surgical shoe to: - with peg assist. Other: - Alleviate as much pressure to the area as possible. Knee scooter-all the time. Wound Treatment Wound #1 - Foot Wound Laterality: Plantar, Right, Medial Cleanser: Vashe 5.8 (oz) Every Other Day/30 Days Discharge Instructions: Cleanse the wound  with Vashe prior to applying a clean dressing using gauze sponges, not tissue or cotton balls. Prim Dressing: Promogran Prisma Matrix, 4.34 (sq in) (silver collagen) Every Other Day/30 Days ary Discharge Instructions: Moisten collagen ([place] JELLY) hydrogel Secondary Dressing: Optifoam Non-Adhesive Dressing, 4x4 in Every Other Day/30 Days Discharge Instructions: Apply foam donut over the wound to aid in offloading Secondary Dressing: Woven Gauze Sponges 2x2 in Every Other Day/30 Days Discharge Instructions: Apply over primary dressing as directed. Secured With: 75M Medipore H Soft Cloth Surgical T ape, 4 x 10 (in/yd) Every Other Day/30 Days Discharge Instructions: Secure with tape as directed. Electronic Signature(s) Signed: 01/22/2023 4:27:46 PM By: Baltazar Najjar MD Signed: 01/22/2023 4:30:01 PM By: Shawn Stall RN, BSN Entered By: Shawn Stall on 01/22/2023 14:41:32 -------------------------------------------------------------------------------- Problem List Details Patient Name: Date of Service: [name], [name] 01/22/2023 2:00 PM Medical Record Number: [id number] Patient Account Number: [id number] Date of Birth/Sex: Treating RN: [birth year] (67 y.o. Dennys, Guin, Lipscomb [name] ([id number]) [id number] Page 4 of 6 Primary Care Provider: [name] Clinician: Referring Provider: Treating Provider/Extender: Robley Fries in Treatment: 3 Active  Problems ICD-10 Encounter Code Description Active Date MDM Diagnosis E11.621 Type 2 diabetes mellitus with foot ulcer 12/30/2022 No Yes L97.518 Non-pressure chronic ulcer of other part of right foot with other specified 12/30/2022 No Yes severity M14.671 Charcot's joint, right ankle and foot 12/30/2022 No Yes Inactive Problems Resolved Problems Electronic Signature(s) Signed: 01/22/2023 4:27:46 PM By: Baltazar Najjar MD Entered By: Baltazar Najjar on 01/22/2023 14:44:04 -------------------------------------------------------------------------------- Progress Note Details Patient Name: Date of Service: Levi Barth. 01/22/2023 2:00 PM Medical Record Number: [id number] Patient Account Number: [id number] Date of Birth/Sex: Treating RN: 05/22/1955 (34 y.o. M) Primary Care Provider: [name] Clinician: Referring Provider: Treating Provider/Extender: Robley Fries in Treatment: 3 Subjective History of Present Illness (HPI) ADMISSION 12/30/2022 Mr. Caul is a 67 year old man with type 2 diabetes and a Charcot foot on the right. He has a history of bilateral foot wounds cared for by [facility] He has also had osteomyelitis with a left second toe amputation on 09/08/2022. He tells me that he is been dealing with a wound on his plantar foot for the last month to 2. He has been using Silvadene with surrounding AandD. More recently Iodoflex and silver alginate. He was in a Psychologist, forensic however more recently has changed to a well-padded surgical shoe. He thinks since this change was made that things are actually better. He is retired, not tremendously active but not sedentary either. Past medical history type 2 diabetes with Charcot deformity, osteomyelitis of left second toe status post amputation ABIs at 1.23 on the left and 1.17 on the right. Also noted that he appears to have had normal arterial studies done by Dr. Kirke Corin in August we will  need to check these next time. I do not believe he has significant arterial disease 9/24; this is a patient we admitted to clinic last week he has type 2 diabetes and a Charcot foot on the right. He was put in a Pegasys shoe at podiatry before he came here and this seems to have really helped things. Last week I debrided this we used silver alginate. Because of the amount of debris over the wound surface I cultured the wound and it grew abundant Morganella. Also Enterococcus. 10/3; patient has a small wound on the lateral part of her right foot in the setting of setting of type 2 diabetes and a  Charcot deformity. He has a Pegasys shoe with foam cover. I am uncertain about how much he is offloading this. A culture of this grew abundant Morganella also Enterococcus we have been using topical gentamicin and silver alginate on the wound there is been no evidence of systemic infection 10/10; small wound on the lateral part of the right foot at the setting of the base of the fifth metatarsal he is a type II diabetic with Charcot deformity. He has been using a Pegasys shoe. We have been using topical gentamicin and silver alginate. There is no evidence of systemic infection. A culture previously grew [name], [name] ([id number]) [id number] Page 5 of 6 Morganella and Enterococcus we used topical gentamicin for this. He tells [place] that he has a knee scooter. I had previously said that I would do a total contact cast this week. He is not prepared for this Objective Constitutional Sitting or standing Blood Pressure is within target range for patient.. Pulse regular and within target range for patient.Marland Kitchen Respirations regular, non-labored and within target range.. Temperature is normal and within the target range for the patient.Marland Kitchen Appears in no distress. Vitals Time Taken: 2:27 PM, Height: 68 in, Weight: 235 lbs, BMI: 35.7, Temperature: 98.5 F, Pulse: 73 bpm, Respiratory Rate: 20  breaths/min, Blood Pressure: 133/81 mmHg. General Notes: Wound exam; lateral part of the right foot Charcot deformity. Small punched out wound thick callus dry skin around the wound I used a #3 curette to remove this and a considerable amount of subcutaneous debris hemostasis with direct pressure fortunately this does not probe to bone it does not look like there is any evidence of surrounding infection Integumentary (Hair, Skin) Wound #1 status is Open. Original cause of wound was Bump. The date acquired was: 09/13/2022. The wound has been in treatment 3 weeks. The wound is located on the Right,Medial,Plantar Foot. The wound measures 0.9cm length x 0.7cm width x 0.4cm depth; 0.495cm^2 area and 0.198cm^3 volume. There is Fat Layer (Subcutaneous Tissue) exposed. There is no tunneling or undermining noted. There is a medium amount of serosanguineous drainage noted. The wound margin is distinct with the outline attached to the wound base. There is no granulation within the wound bed. There is no necrotic tissue within the wound bed. The periwound skin appearance had no abnormalities noted for moisture. The periwound skin appearance had no abnormalities noted for color. The periwound skin appearance exhibited: Callus. The periwound skin appearance did not exhibit: Scarring. Periwound temperature was noted as No Abnormality. General Notes: dried blood noted in wound bed- unable to visualize wound bed. Assessment Active Problems ICD-10 Type 2 diabetes mellitus with foot ulcer Non-pressure chronic ulcer of other part of right foot with other specified severity Charcot's joint, right ankle and foot Procedures Wound #1 Pre-procedure diagnosis of Wound #1 is a Diabetic Wound/Ulcer of the Lower Extremity located on the Right,Medial,Plantar Foot .Severity of Tissue Pre Debridement is: Fat layer exposed. There was a Excisional Skin/Subcutaneous Tissue Debridement with a total area of 0.49 sq cm performed by  Maxwell Caul., MD. With the following instrument(s): Curette to remove Viable and Non-Viable tissue/material. Material removed includes Callus, Subcutaneous Tissue, Slough, Skin: Dermis, and Skin: Epidermis after achieving pain control using Lidocaine 4% Topical Solution. A time out was conducted at 14:30, prior to the start of the procedure. A Minimum amount of bleeding was controlled with Pressure. The procedure was tolerated well with a pain level of 0 throughout and a pain level of 0 following the  procedure. Post Debridement Measurements: 0.9cm length x 0.7cm width x 0.4cm depth; 0.198cm^3 volume. Character of Wound/Ulcer Post Debridement is improved. Severity of Tissue Post Debridement is: Fat layer exposed. Post procedure Diagnosis Wound #1: Same as Pre-Procedure Plan Follow-up Appointments: Return Appointment in 1 week. - Thursday Dr. Leanord Hawking room 9 01/29/2023 0900 Return Appointment in 2 weeks. - Dr. Leanord Hawking 02/05/2023 Return appointment in 3 weeks. - Dr .Leanord Hawking (front office schedule) Return appointment in 1 month. - Dr .Leanord Hawking (front office schedule) Other: - Stop gentamicin start using knee scooter all the time. Anesthetic: (In clinic) Topical Lidocaine 4% applied to wound bed Bathing/ Shower/ Hygiene: May shower with protection but do not get wound dressing(s) wet. Protect dressing(s) with water repellant cover (for example, large plastic bag) or a cast cover and may then take shower. Off-Loading: Wound #1 Right,Medial,Plantar Foot: Open toe surgical shoe to: - with peg assist. Other: - Alleviate as much pressure to the area as possible. Knee scooter-all the time. [name], [name] ([id number]) [id number] Page 6 of 6 WOUND #1: - Foot Wound Laterality: Plantar, Right, Medial Cleanser: Vashe 5.8 (oz) Every Other Day/30 Days Discharge Instructions: Cleanse the wound with Vashe prior to applying a clean dressing using gauze sponges, not tissue or cotton  balls. Prim Dressing: Promogran Prisma Matrix, 4.34 (sq in) (silver collagen) Every Other Day/30 Days ary Discharge Instructions: Moisten collagen ([place] JELLY) hydrogel Secondary Dressing: Optifoam Non-Adhesive Dressing, 4x4 in Every Other Day/30 Days Discharge Instructions: Apply foam donut over the wound to aid in offloading Secondary Dressing: Woven Gauze Sponges 2x2 in Every Other Day/30 Days Discharge Instructions: Apply over primary dressing as directed. Secured With: 70M Medipore H Soft Cloth Surgical T ape, 4 x 10 (in/yd) Every Other Day/30 Days Discharge Instructions: Secure with tape as directed. 1. Nonhealing wound in a diabetic Charcot foot over the right fifth metatarsal base. 2. He claims to be not walking much on this shoe 3. He bought himself a knee scooter from [place] he is going to bring that up next week. I explained to him that this is not a alternative to a total contact cast if people are rigorous about using it which is often not the case 4. Change the primary dressing to moistened silver collagen, felt still using his Pegasys shoe Electronic Signature(s) Signed: 01/22/2023 4:27:46 PM By: Baltazar Najjar MD Entered By: Baltazar Najjar on 01/22/2023 14:48:38 -------------------------------------------------------------------------------- SuperBill Details Patient Name: Date of Service: [name], [name] 01/22/2023 Medical Record Number: [id number] Patient Account Number: [id number] Date of Birth/Sex: Treating RN: 09/26/1955 (67 y.o. Harlon Flor, Millard.Loa Primary Care Provider: [name] Clinician: Referring Provider: Treating Provider/Extender: Robley Fries in Treatment: 3 Diagnosis Coding ICD-10 Codes Code Description [id number] Type 2 diabetes mellitus with foot ulcer L97.518 Non-pressure chronic ulcer of other part of right foot with other specified severity M14.671 Charcot's joint, right ankle and foot Facility  Procedures : CPT4 Code: [id number] Description: 11042 - DEB SUBQ TISSUE 20 SQ CM/< ICD-10 Diagnosis Description L97.518 Non-pressure chronic ulcer of other part of right foot with other specified sev E11.621 Type 2 diabetes mellitus with foot ulcer Modifier: erity Quantity: 1 Physician Procedures : CPT4 Code Description Modifier [id number] 11042 - WC PHYS SUBQ TISS 20 SQ CM ICD-10 Diagnosis Description L97.518 Non-pressure chronic ulcer of other part of right foot with other specified severity E11.621 Type 2 diabetes mellitus with foot ulcer Quantity: 1 Electronic Signature(s) Signed: 01/22/2023 4:27:46 PM By: Baltazar Najjar MD Entered By: Baltazar Najjar on 01/22/2023 14:48:51

## 2023-01-25 ENCOUNTER — Encounter: Payer: Self-pay | Admitting: Podiatry

## 2023-01-27 ENCOUNTER — Ambulatory Visit: Payer: Medicare PPO | Admitting: Podiatry

## 2023-01-27 DIAGNOSIS — L97412 Non-pressure chronic ulcer of right heel and midfoot with fat layer exposed: Secondary | ICD-10-CM

## 2023-01-27 NOTE — Progress Notes (Signed)
  No chief complaint on file.    67 y.o. male presents with above concerns.  He has been doing the wound care center.  He wanted to come in to have me take a look at his wound.  He is scheduled for a cast possibly on Thursday.  Denies any fevers or chills.  He has been continue with daily dressing changes.   Objective:   General: AAO x3, NAD-present send foot defender boot right foot  Dermatological: On the plantar medial aspect of the right foot hyperkeratotic lesion with granular ulceration still present.  Overall somewhat smaller but still remains about the same.  There is no probing, and or tunneling.  There is no surrounding erythema, ascending cellulitis there is no fluctuation or crepitation is noted.  No signs of infection noted today.  Vascular: DP/PT pulses palpable.  There is no pain with calf compression, swelling, warmth, erythema.   Neruologic: Sensation decreased  Musculoskeletal: Significant flatfoot is present.  Hammertoes are present; proximal left third toe hammertoe  Gait: Unassisted, Nonantalgic.    Assessment:   Significant flatfoot with chronic ulceration right foot  Plan:  Patient was evaluated and treated and all questions answered.   Right midfoot ulcer -I sharply debrided hyperkeratotic lesion with any complications or bleeding.  Overall the wound is stable no signs of infection but remains about the same.  We discussed total contact casting I do advise that this would be beneficial for him.  He did drive here today.  Due to this I will plan on putting the total contact cast on Monday for him.  Continue the knee scooter nonweightbearing much as possible.  Continue daily dressing changes for now. -Monitor for any clinical signs or symptoms of infection and directed to call the office immediately should any occur or go to the ER.  No follow-ups on file.  [name]   [name]

## 2023-01-29 ENCOUNTER — Ambulatory Visit (HOSPITAL_BASED_OUTPATIENT_CLINIC_OR_DEPARTMENT_OTHER): Payer: Medicare PPO | Admitting: Internal Medicine

## 2023-02-02 ENCOUNTER — Ambulatory Visit (HOSPITAL_BASED_OUTPATIENT_CLINIC_OR_DEPARTMENT_OTHER): Payer: Medicare PPO | Admitting: Internal Medicine

## 2023-02-02 ENCOUNTER — Ambulatory Visit: Payer: Medicare PPO | Admitting: Podiatry

## 2023-02-02 DIAGNOSIS — L97412 Non-pressure chronic ulcer of right heel and midfoot with fat layer exposed: Secondary | ICD-10-CM

## 2023-02-04 NOTE — Progress Notes (Signed)
  No chief complaint on file.    67 y.o. male presents with above concerns.    He has been doing the wound care center.  He wanted to come in to have me take a look at his wound.  He is scheduled for a cast possibly on Thursday.  Denies any fevers or chills.  He has been continue with daily dressing changes.   Objective:   General: AAO x3, NADt  Dermatological: On the plantar medial aspect of the right foot hyperkeratotic lesion with granular ulceration still present.  Ulceration is the same in size.  There is hyperkeratotic periwound.  Upon debridement there is no purulence noted and there is no probing, amount or tunneling.  There is no surrounding erythema, ascending cellulitis.  No fluctuation or crepitation.  There is no malodor.  Vascular: DP/PT pulses palpable.  There is no pain with calf compression, swelling, warmth, erythema.   Neruologic: Sensation decreased  Musculoskeletal: Significant flatfoot is present.  Hammertoes are present; proximal left third toe hammertoe  Gait: Unassisted, Nonantalgic.    Assessment:   Significant flatfoot with chronic ulceration right foot  Plan:  Patient was evaluated and treated and all questions answered.   Right midfoot ulcer -I sharply debrided hyperkeratotic lesion with any complications or bleeding.  Ulceration is doing about the same in size.  There is no obvious signs of infection noted today.  We can discuss pros and cons of total contact casting as well as risks.  He understands this.  Today while padded total contact cast was applied make sure to pad all bony prominences.  Strict return precautions were discussed for he is going emergency room should there be any rubbing or irritation of the cast. I will see him back later this week to change the cast and to make sure no irritation noted. He also needs to monitor the left foot very closely.  [name]

## 2023-02-05 ENCOUNTER — Encounter: Payer: Self-pay | Admitting: Podiatry

## 2023-02-05 ENCOUNTER — Ambulatory Visit (HOSPITAL_BASED_OUTPATIENT_CLINIC_OR_DEPARTMENT_OTHER): Payer: Medicare PPO | Admitting: Internal Medicine

## 2023-02-05 ENCOUNTER — Ambulatory Visit: Payer: Medicare PPO | Admitting: Podiatry

## 2023-02-05 DIAGNOSIS — M14671 Charcot's joint, right ankle and foot: Secondary | ICD-10-CM

## 2023-02-05 DIAGNOSIS — L97412 Non-pressure chronic ulcer of right heel and midfoot with fat layer exposed: Secondary | ICD-10-CM | POA: Diagnosis not present

## 2023-02-09 ENCOUNTER — Encounter: Payer: Self-pay | Admitting: Podiatry

## 2023-02-09 ENCOUNTER — Ambulatory Visit: Payer: Medicare PPO | Admitting: Podiatry

## 2023-02-09 DIAGNOSIS — M14671 Charcot's joint, right ankle and foot: Secondary | ICD-10-CM | POA: Diagnosis not present

## 2023-02-09 DIAGNOSIS — L97412 Non-pressure chronic ulcer of right heel and midfoot with fat layer exposed: Secondary | ICD-10-CM | POA: Diagnosis not present

## 2023-02-09 NOTE — Progress Notes (Signed)
  Chief Complaint  Patient presents with   Routine Post Op    PATIENT STATES THAT HIS FOOT HAS BEEN OK A LITTLE UNCOMFORTABLE      67 y.o. male presents with above concerns.  Presents today for total contact cast.  He states that found it was rubbing little bit and causing some discomfort he Had on the area.  Otherwise he is doing well.  No fevers or chills.  No other concerns.   Objective:   General: AAO x3, NADt  Dermatological: On the plantar medial aspect of the right foot hyperkeratotic lesion with granular ulceration still present.  Ulceration is the same in size.  There is mild hyperkeratotic tissue on the periphery.  There is no drainage or pus.  No fluctuation or crepitation.  There is no malodor.  There is no surrounding erythema or signs of infection otherwise.  Mild irritation to the anterior tibial crest proximally.  No sign erythema or signs of infection.  Vascular: DP/PT pulses palpable.  There is no pain with calf compression, swelling, warmth, erythema.   Neruologic: Sensation decreased  Musculoskeletal: Significant flatfoot is present.  Hammertoes are present; proximal left third toe hammertoe  Gait: Unassisted, Nonantalgic.    Assessment:   Significant flatfoot with chronic ulceration right foot  Plan:  Patient was evaluated and treated and all questions answered.   Right midfoot ulcer -I sharply debrided hyperkeratotic lesion with any complications or bleeding.  Ulceration is doing about the same in size.  There is no obvious signs of infection noted today.  The new total contact cast was reapplied and sharp (.  Initially The area of irritation on anterior shin well.  Discussed if there is any discomfort or any rubbing to the medial border and change earlier otherwise we will see him next week. -Monitor for any clinical signs or symptoms of infection and directed to call the office immediately should any occur or go to the ER.  No follow-ups on  file.  [name]

## 2023-02-12 ENCOUNTER — Encounter: Payer: Self-pay | Admitting: Podiatry

## 2023-02-12 ENCOUNTER — Ambulatory Visit: Payer: Medicare PPO | Admitting: Podiatry

## 2023-02-12 DIAGNOSIS — L97412 Non-pressure chronic ulcer of right heel and midfoot with fat layer exposed: Secondary | ICD-10-CM

## 2023-02-15 NOTE — Progress Notes (Signed)
  No chief complaint on file.    67 y.o. male presents with above concerns.  Presents today for total contact cast change.  Is been doing well.  No fevers or chills.  No other concerns.     Objective:   General: AAO x3, NADt  Dermatological: On the plantar medial aspect of the right foot hyperkeratotic lesion with granular ulceration still present.  Ulceration is a high percent granular and appears to be more superficial but the same in diameter.  There is no surrounding erythema, ascending size.  There is no fluctuation or crepitation.  No malodor.  There is some macerated tissue on the plantar aspect of the foot.  There is also superficial abrasion on the tibial crest proximally from the cast.  There is no sign erythema or signs of infection.  Vascular: DP/PT pulses palpable.  There is no pain with calf compression, swelling, warmth, erythema.   Neruologic: Sensation decreased  Musculoskeletal: Significant flatfoot is present.  Hammertoes are present; proximal left third toe hammertoe  Gait: Unassisted, Nonantalgic.    Assessment:   Significant flatfoot with chronic ulceration right foot  Plan:  Patient was evaluated and treated and all questions answered.   Right midfoot ulcer -We did not reapply the cast today given macerated tissue as well as the irritation.  Will give him a break on plantar reapplied the Later this week.  Betadine applied to the wound.  Continue daily dressing changes.  Offloading and nonweightbearing much as possible.  He has a surgical shoe with peg assist and he has a knee scooter as well to help assist with this. -Monitor for any clinical signs or symptoms of infection and directed to call the office immediately should any occur or go to the ER.  No follow-ups on file.  [name]

## 2023-02-16 NOTE — Progress Notes (Signed)
  No chief complaint on file.    67 y.o. male presents with above concerns.  Presents today for total contact cast.  No swelling fever chills.  He has been changing the bandage daily.  No other concerns.   Objective:   General: AAO x3, NADt  Dermatological: On the plantar medial aspect of the right foot hyperkeratotic lesion with granular ulceration still present.  Ulceration is a high percent granular and appears to be more superficial but the same in diameter.  There is no fluctuation or crepitation there is no malodor.   Vascular: DP/PT pulses palpable.  There is no pain with calf compression, swelling, warmth, erythema.   Neruologic: Sensation decreased  Musculoskeletal: Significant flatfoot is present.  Hammertoes are present; proximal left third toe hammertoe  Gait: Unassisted, Nonantalgic.    Assessment:   Significant flatfoot with chronic ulceration right foot  Plan:  Patient was evaluated and treated and all questions answered.   Right midfoot ulcer -Cleaned the wound today.  Hydrofera Blue was applied to this as well as the anterior shin abrasion.  Well-padded total contact cast was applied making sure to offload bony prominences.  Strict return precautions discussed should there be any irritation or pain or rubbing of the cast.  No follow-ups on file.  [name]

## 2023-02-17 ENCOUNTER — Ambulatory Visit: Payer: Medicare PPO | Admitting: Podiatry

## 2023-02-17 DIAGNOSIS — M14671 Charcot's joint, right ankle and foot: Secondary | ICD-10-CM

## 2023-02-17 DIAGNOSIS — L97412 Non-pressure chronic ulcer of right heel and midfoot with fat layer exposed: Secondary | ICD-10-CM | POA: Diagnosis not present

## 2023-02-17 DIAGNOSIS — G4733 Obstructive sleep apnea (adult) (pediatric): Secondary | ICD-10-CM | POA: Diagnosis not present

## 2023-02-17 MED ORDER — PREGABALIN 150 MG PO CAPS: 150.0000 mg | ORAL_CAPSULE | Freq: Two times a day (BID) | ORAL | 0 refills | Status: DC

## 2023-02-17 MED ORDER — DOXYCYCLINE HYCLATE 100 MG PO TABS: 100.0000 mg | ORAL_TABLET | Freq: Two times a day (BID) | ORAL | 0 refills | Status: DC

## 2023-02-19 ENCOUNTER — Ambulatory Visit: Payer: Medicare PPO | Admitting: Podiatry

## 2023-02-19 ENCOUNTER — Encounter: Payer: Self-pay | Admitting: Podiatry

## 2023-02-19 DIAGNOSIS — L97412 Non-pressure chronic ulcer of right heel and midfoot with fat layer exposed: Secondary | ICD-10-CM

## 2023-02-19 DIAGNOSIS — M14671 Charcot's joint, right ankle and foot: Secondary | ICD-10-CM | POA: Diagnosis not present

## 2023-02-23 NOTE — Progress Notes (Signed)
   67 y.o. male presents today for follow-up evaluation of ulceration and cast change.    With above concerns.  Presents today for total contact cast.  Does not report any fevers or chills.  No significant increased pain.   Objective:   General: AAO x3, NADt  Dermatological: On the plantar medial aspect of the right foot hyperkeratotic lesion with granular ulceration still present.  Ulcerations are present remain there about the same size the more superficial.  There is a new ulceration present on the medial aspect of the talonavicular joint with mild rim of erythema.  There is blister formation of the anterior shin as well.  There is no fluctuation or crepitation.   Vascular: DP/PT pulses palpable.  There is no pain with calf compression, swelling, warmth, erythema.   Neruologic: Sensation decreased  Musculoskeletal: Significant flatfoot is present.  Hammertoes are present; proximal left third toe hammertoe  Gait: Unassisted, Nonantalgic.    Assessment:   Significant flatfoot with chronic ulceration right foot  Plan:  Patient was evaluated and treated and all questions answered.   Right midfoot ulcer -Catheter removed today unfortunately ulceration as well as blister formation.  I cleaned both of the wounds.  Continue hide for dressing changes to the wounds on the foot.  Betadine or antibiotic ointment on the anterior shin superficial abrasion, blister.  Due to this I did not reapply the cast.  Continue surgical shoe, offloading, nonweightbearing. -Monitor for any clinical signs or symptoms of infection and directed to call the office immediately should any occur or go to the ER.  No follow-ups on file.  [name]

## 2023-02-25 NOTE — Progress Notes (Signed)
   Chief Complaint  Patient presents with   Foot Pain    Cast change     67 y.o. male presents today for follow-up evaluation of the above concerns presents today for Evaluation of the wound that was noted to be worsening after undergoing total contact cast.  He has been using Hydrofera Blue dressings on the wound daily.  He does not report any increased drainage or swelling or redness no fevers or chills.  Objective:   General: AAO x3, NAD  Dermatological: On the plantar medial aspect of the right foot hyperkeratotic lesion with granular ulceration still present.  Wound is more superficial.  There is no probing to [place] or tunneling.  There is a new ulceration present on the talar head medially is starting to scab over.  Moderate erythema inflammation I think more than infection.  There is no drainage or pus.  There is no probing, undermining or tunneling.  There is still some blister formation on the tibial crest.  No surrounding erythema, drainage or pus or signs of infection.   Vascular: DP/PT pulses palpable.  There is no pain with calf compression, swelling, warmth, erythema.   Neruologic: Sensation decreased  Musculoskeletal: Significant flatfoot is present.  Hammertoes are present; proximal left third toe hammertoe  Gait: Unassisted, Nonantalgic.    Assessment:   Significant flatfoot with chronic ulceration right foot  Plan:  Patient was evaluated and treated and all questions answered.   Right midfoot ulcer -Presents today for wound check.  Patient is stable and improving.  Continue Hydrofera Blue dressing changes daily.  Continue offloading, nonweightbearing.  Continue use of knee scooter, surgical shoe with peg assist.  Finish course of antibiotics. -Monitor for any clinical signs or symptoms of infection and directed to call the office immediately should any occur or go to the ER.  Return in about 1 week (around 02/26/2023) for ulcer right foot.  [name]

## 2023-02-26 ENCOUNTER — Ambulatory Visit: Payer: Medicare PPO | Admitting: Podiatry

## 2023-02-27 ENCOUNTER — Ambulatory Visit: Payer: Medicare PPO | Admitting: Podiatry

## 2023-02-27 DIAGNOSIS — L97412 Non-pressure chronic ulcer of right heel and midfoot with fat layer exposed: Secondary | ICD-10-CM | POA: Diagnosis not present

## 2023-02-27 NOTE — Progress Notes (Signed)
    67 y.o. male presents today for follow-up evaluation   of the above concerns presents today for follow-up evaluation of the wound to his right foot.  He has been using Hydrofera Blue dressings on the wound daily.  He does not report any increased drainage or swelling or redness no fevers or chills. No new concerns reported today.   Objective:   General: AAO x3, NAD  Dermatological: On the plantar medial aspect of the right foot hyperkeratotic lesion with granular ulceration still present. Prior to debridement, the wound was not able to be visualized due to the callus formation. After I debrided the ulcer, it measured 0.6 x 0.6 x 0.1 cm and is granulating in with 100% granular base.  There is no probing, undermining or tunneling.  No fluctuation or crepitation.  There is no malodor.  Area of the previous ulceration on the medial aspect of the navicular tuberosity has scabbed over.  The blister on the anterior shin is also much improvement will completely resolved.  Vascular: DP/PT pulses palpable.  There is no pain with calf compression, swelling, warmth, erythema.   Neruologic: Sensation decreased  Musculoskeletal: Significant flatfoot is present.  Hammertoes are present; proximal left third toe hammertoe  Gait: Unassisted, Nonantalgic.    Assessment:   Significant flatfoot with chronic ulceration right foot  Plan:  Patient was evaluated and treated and all questions answered.   Right midfoot ulcer -Medically necessary wound debridements performed today.  I sharply debrided the ulceration on the plantar aspect the right ankle without any applications to the granular tissue.  Debrided nonviable, utilize tissue with a 312 with scalpel to promote wound healing.  No blood loss.  Clean the area with saline.  Hydrofera Blue was applied followed by dressing.  Continue daily dressing changes.  Monitoring signs or symptoms of infection.  Continue offloading all times. -No obvious signs of  infection so hold further antibiotics at this time.  Return in about 10 days (around 03/09/2023).  [name]

## 2023-03-08 ENCOUNTER — Other Ambulatory Visit: Payer: Self-pay | Admitting: Podiatry

## 2023-03-10 ENCOUNTER — Ambulatory Visit: Payer: Medicare PPO | Admitting: Podiatry

## 2023-03-10 DIAGNOSIS — L97412 Non-pressure chronic ulcer of right heel and midfoot with fat layer exposed: Secondary | ICD-10-CM | POA: Diagnosis not present

## 2023-03-10 DIAGNOSIS — E1140 Type 2 diabetes mellitus with diabetic neuropathy, unspecified: Secondary | ICD-10-CM

## 2023-03-10 DIAGNOSIS — L84 Corns and callosities: Secondary | ICD-10-CM

## 2023-03-10 NOTE — Progress Notes (Signed)
    67 y.o. male presents today for follow-up evaluation   of the above concerns presents today for follow-up evaluation of the wound to his right foot.  He has been continuing Hydrofera Blue dressing changes.  States he has been doing well has not been bleeding or draining.  No increase in swelling or redness.  Does not report any fevers or chills.  He has no other concerns today.   Objective:   General: AAO x3, NAD  Dermatological: On the plantar medial aspect of the right foot on the area the ulceration there is hyperkeratotic tissue around the area.  Upon debridement there is superficial breakdown but appears to almost be healed at this time.  There is no probing, undermining or tunneling.  There is no fluctuation or crepitation.  There is no malodor.  Preulcerative callus noted on the same area on the left foot only.  The previous ulceration with any skin breakdown today.  Previous blister formation of anterior leg on the right side is resolved and small scab is present along the medial aspect with a secondary ulceration had formed from total contact cast.  No signs of infection noted today.  Vascular: DP/PT pulses palpable.  There is no pain with calf compression, swelling, warmth, erythema.   Neruologic: Sensation decreased  Musculoskeletal: Significant flatfoot is present.  Hammertoes are present; proximal left third toe hammertoe  Gait: Unassisted, Nonantalgic.    Assessment:   Significant flatfoot with chronic ulceration right foot  Plan:  Patient was evaluated and treated and all questions answered.   Right midfoot ulcer -Sharply debride the hyperkeratotic lesion left foot without any complications or bleeding.  On the right foot I also started with callus to reveal superficial temper this appears to be doing much better.  There is no drainage or any signs of infection.  For now we will still continue with Hydrofera Blue dressing changes daily. -Get a surgical shoe, peg  assist for offloading -Monitor for any clinical signs or symptoms of infection and directed to call the office immediately should any occur or go to the ER.  Return in about 2 weeks (around 03/24/2023) for ulcer.  [name]

## 2023-03-11 ENCOUNTER — Telehealth: Payer: Self-pay | Admitting: *Deleted

## 2023-03-11 NOTE — Telephone Encounter (Signed)
[id number]/tcf-patient and return call.  Has a free hospital bed to donate to the cnp.  Reached out to [place] and placed in newsletter for the cn program. Bed is a [employer] automatic bid boy with air mattress. Also asked about callers foot he is a diabetic with chronic ulcerations.  Healing and doing better hospes to be able to wear a regular shoe soon.

## 2023-03-18 ENCOUNTER — Encounter: Payer: Self-pay | Admitting: *Deleted

## 2023-03-18 NOTE — Congregational Nurse Program (Signed)
Caller has a big boy hospital bed automatic with new air mattress.  Is offering to donate to the cnp  program. Tct-victoria hussey at the cnp program. She [facility]d [facility]  Otila Kluver.  Tct-jane/can use the bed. After multiple calls bed will be moved through  . And delivered to the new address.  Mr. Errington expressed his satisfaction.

## 2023-03-19 DIAGNOSIS — G4733 Obstructive sleep apnea (adult) (pediatric): Secondary | ICD-10-CM | POA: Diagnosis not present

## 2023-03-24 ENCOUNTER — Ambulatory Visit: Payer: Medicare PPO | Admitting: Podiatry

## 2023-03-24 DIAGNOSIS — L97412 Non-pressure chronic ulcer of right heel and midfoot with fat layer exposed: Secondary | ICD-10-CM

## 2023-03-24 DIAGNOSIS — L84 Corns and callosities: Secondary | ICD-10-CM

## 2023-03-29 NOTE — Progress Notes (Signed)
    67 y.o. male presents today for follow-up evaluation of the above concerns presents today for follow-up evaluation of the wound to his right foot.  States he has been feeling well.  Still in 1 surgical shoe with peg assist.  He does not report any fevers or chills and thinks the wound is much better.  He has no other concerns today.   Objective:   General: AAO x3, NAD  Dermatological: On the plantar medial aspect of the right foot in the area the ulceration this appears to have healed a small scab is present but there is no drainage or pus.  There is no fluctuation or crepitation.  There is no malodor.  There is also the area more medially on the nuclear tuberosity we had the wound and this appears to have scarred over.  There is no new ulcerations present bilaterally.  There is no signs of infection bilaterally today.  Vascular: DP/PT pulses palpable.  There is no pain with calf compression, swelling, warmth, erythema.   Neruologic: Sensation decreased  Musculoskeletal: Significant flatfoot is present.  Hammertoes are present; proximal left third toe hammertoe  Gait: Unassisted, Nonantalgic.    Assessment:   Significant flatfoot with chronic ulceration right foot  Plan:  Patient was evaluated and treated and all questions answered.   Right midfoot ulcer -Overall doing much better.  His foot wound is healing decreasing hyperkeratotic tissue however scab is still present.  I want to continue with dressing changes as well as wearing a surgical shoe for offloading for now continue to facilitate healing.  Monitor for any signs or symptoms of infection or any reoccurrence of the ulceration or any worsening. He can continue with Hydrofera Blue dressing changes.  Return in about 4 weeks (around 04/21/2023).  [name]

## 2023-04-17 ENCOUNTER — Encounter: Payer: Self-pay | Admitting: Podiatry

## 2023-04-17 NOTE — Telephone Encounter (Signed)
Dawn, can you please assist? Thanks.

## 2023-04-19 DIAGNOSIS — G4733 Obstructive sleep apnea (adult) (pediatric): Secondary | ICD-10-CM | POA: Diagnosis not present

## 2023-04-21 ENCOUNTER — Ambulatory Visit: Payer: Medicare PPO | Admitting: Podiatry

## 2023-04-21 ENCOUNTER — Encounter: Payer: Self-pay | Admitting: Podiatry

## 2023-04-21 DIAGNOSIS — E1140 Type 2 diabetes mellitus with diabetic neuropathy, unspecified: Secondary | ICD-10-CM

## 2023-04-21 DIAGNOSIS — L84 Corns and callosities: Secondary | ICD-10-CM

## 2023-04-21 DIAGNOSIS — G4733 Obstructive sleep apnea (adult) (pediatric): Secondary | ICD-10-CM | POA: Diagnosis not present

## 2023-04-21 DIAGNOSIS — L97412 Non-pressure chronic ulcer of right heel and midfoot with fat layer exposed: Secondary | ICD-10-CM | POA: Diagnosis not present

## 2023-04-21 NOTE — Progress Notes (Signed)
    68 y.o. male presents today for follow-up evaluation of the above concerns presents today for follow-up evaluation of the wound to his right foot.  States that he is doing much better.  He has an appointment he measured for new diabetic shoes, inserts.  No wounds of healed.  Does not report any fevers or chills.  No other concerns today.     Objective:   General: AAO x3, NAD  Dermatological: On the plantar medial aspect of the right foot on the arch with the previous wound this is completely healed.  There is very minimal callus formation today.  Along the medial arch of the foot where he had the new wound from the total contact cast that is healed as well.  No ulceration left foot.  There is no ulceration bilaterally but overall doing much better.  Vascular: DP/PT pulses palpable.  There is no pain with calf compression, swelling, warmth, erythema.   Neruologic: Sensation decreased  Musculoskeletal: Significant flatfoot is present.  Hammertoes are present; proximal left third toe hammertoe  Gait: Unassisted, Nonantalgic.    Assessment:   Significant flatfoot with chronic ulceration right foot  Plan:  Patient was evaluated and treated and all questions answered.   Right midfoot ulcer -Overall doing much better.  Wounds of healed.  We discussed gradually transition back into his shoe, diabetic insert.  Discussed gradual transition needs to monitor closely for any signs or symptoms of infection or skin breakdown.   -Daily foot inspection -Follow up for new diabetic shoes/inserts  Return in about 4 weeks (around 05/19/2023).  [name] DPM

## 2023-05-05 ENCOUNTER — Ambulatory Visit: Payer: Medicare PPO | Admitting: Podiatry

## 2023-05-05 DIAGNOSIS — E1165 Type 2 diabetes mellitus with hyperglycemia: Secondary | ICD-10-CM | POA: Diagnosis not present

## 2023-05-05 DIAGNOSIS — Z6836 Body mass index (BMI) 36.0-36.9, adult: Secondary | ICD-10-CM | POA: Diagnosis not present

## 2023-05-05 DIAGNOSIS — G4733 Obstructive sleep apnea (adult) (pediatric): Secondary | ICD-10-CM | POA: Diagnosis not present

## 2023-05-06 ENCOUNTER — Other Ambulatory Visit: Payer: Self-pay | Admitting: Podiatry

## 2023-05-14 ENCOUNTER — Ambulatory Visit: Payer: Medicare PPO | Admitting: Podiatry

## 2023-05-14 ENCOUNTER — Ambulatory Visit: Payer: Medicare PPO

## 2023-05-14 DIAGNOSIS — M2042 Other hammer toe(s) (acquired), left foot: Secondary | ICD-10-CM

## 2023-05-14 DIAGNOSIS — H2513 Age-related nuclear cataract, bilateral: Secondary | ICD-10-CM | POA: Diagnosis not present

## 2023-05-14 DIAGNOSIS — L84 Corns and callosities: Secondary | ICD-10-CM

## 2023-05-14 DIAGNOSIS — L97412 Non-pressure chronic ulcer of right heel and midfoot with fat layer exposed: Secondary | ICD-10-CM

## 2023-05-14 DIAGNOSIS — E119 Type 2 diabetes mellitus without complications: Secondary | ICD-10-CM | POA: Diagnosis not present

## 2023-05-14 DIAGNOSIS — E1140 Type 2 diabetes mellitus with diabetic neuropathy, unspecified: Secondary | ICD-10-CM

## 2023-05-14 DIAGNOSIS — M2141 Flat foot [pes planus] (acquired), right foot: Secondary | ICD-10-CM

## 2023-05-14 DIAGNOSIS — M14671 Charcot's joint, right ankle and foot: Secondary | ICD-10-CM

## 2023-05-14 DIAGNOSIS — H524 Presbyopia: Secondary | ICD-10-CM | POA: Diagnosis not present

## 2023-05-14 NOTE — Progress Notes (Signed)
Patient presents to the office today for diabetic shoe and insole measuring.  Patient was measured with brannock device to determine size and width for 1 pair of extra depth shoes and foam casted for 3 pair of insoles.   Documentation of medical necessity will be sent to patient's treating diabetic doctor to verify and sign.   Patient's diabetic provider: Emilio Aspen, MD  Shoes and insoles will be ordered at that time and patient will be notified for an appointment for fitting when they arrive.   Shoe size (per patient): 10.5 Brannock measurement: 10 Shoe choice:[id number] / [id number] Shoe size ordered: 10.5WD  (Jessica's dad)

## 2023-05-18 ENCOUNTER — Encounter: Payer: Self-pay | Admitting: Podiatry

## 2023-05-19 ENCOUNTER — Other Ambulatory Visit: Payer: Self-pay | Admitting: Podiatry

## 2023-05-19 MED ORDER — PREGABALIN 150 MG PO CAPS: 150.0000 mg | ORAL_CAPSULE | Freq: Two times a day (BID) | ORAL | 0 refills | Status: DC

## 2023-05-20 DIAGNOSIS — G4733 Obstructive sleep apnea (adult) (pediatric): Secondary | ICD-10-CM | POA: Diagnosis not present

## 2023-05-22 ENCOUNTER — Ambulatory Visit: Payer: Medicare PPO | Admitting: Podiatry

## 2023-05-22 ENCOUNTER — Encounter: Payer: Self-pay | Admitting: Podiatry

## 2023-05-22 DIAGNOSIS — B351 Tinea unguium: Secondary | ICD-10-CM

## 2023-05-22 DIAGNOSIS — M2141 Flat foot [pes planus] (acquired), right foot: Secondary | ICD-10-CM | POA: Diagnosis not present

## 2023-05-22 DIAGNOSIS — E1140 Type 2 diabetes mellitus with diabetic neuropathy, unspecified: Secondary | ICD-10-CM

## 2023-05-22 DIAGNOSIS — L84 Corns and callosities: Secondary | ICD-10-CM | POA: Diagnosis not present

## 2023-05-22 DIAGNOSIS — M79674 Pain in right toe(s): Secondary | ICD-10-CM | POA: Diagnosis not present

## 2023-05-22 DIAGNOSIS — M2142 Flat foot [pes planus] (acquired), left foot: Secondary | ICD-10-CM

## 2023-05-22 DIAGNOSIS — M79675 Pain in left toe(s): Secondary | ICD-10-CM | POA: Diagnosis not present

## 2023-05-26 NOTE — Progress Notes (Signed)
    Chief Complaint  Patient presents with   Foot Ulcer    RM#11 Right foot ulcer care patient states it heeled well.    68 y.o. male presents today for follow-up evaluation of wounds, preulcerative calluses as well as for thick, elongated nails is not able to trim himself.  The wounds that he had previously have healed and he is back to wearing regular shoes.  Objective:   General: AAO x3, NAD  Dermatological: The medial aspect of the left foot plantarly as well as the right foot near the previous wound there is very minimal callus formation there is no ulcerations at this time.  There is no open lesions. Nails are hypertrophic, dystrophic, brittle, discolored, elongated 10. No surrounding redness or drainage. Tenderness nails 1-5 bilaterally except the left second toe which has been amputated  Vascular: DP/PT pulses palpable.  There is no pain with calf compression, swelling, warmth, erythema.   Neruologic: Sensation decreased  Musculoskeletal: Significant flatfoot is present.  Hammertoes are present; proximal left third toe hammertoe previous partial second toe amputation  Gait: Unassisted, Nonantalgic.    Assessment:   Significant flatfoot with history of bilateral ulcerations which have healed; symptomatic onychomycosis  Plan:  Patient was evaluated and treated and all questions answered.   Right midfoot ulcer -Ulcers have since healed.  There is very minimal callus formation today not significant tissue to debride.  Continue moisturizer, offloading. -Daily foot inspection -Follow up for new diabetic shoes/inserts  Symptomatic onychomycosis -Sharply debrided nails x 9 without any complications or bleeding.  Return in about 2 months (around 07/23/2023) for nail/callus trim.  [name] DPM

## 2023-06-09 DIAGNOSIS — E1165 Type 2 diabetes mellitus with hyperglycemia: Secondary | ICD-10-CM | POA: Diagnosis not present

## 2023-06-09 DIAGNOSIS — Z6836 Body mass index (BMI) 36.0-36.9, adult: Secondary | ICD-10-CM | POA: Diagnosis not present

## 2023-06-09 DIAGNOSIS — G4733 Obstructive sleep apnea (adult) (pediatric): Secondary | ICD-10-CM | POA: Diagnosis not present

## 2023-06-12 ENCOUNTER — Ambulatory Visit (INDEPENDENT_AMBULATORY_CARE_PROVIDER_SITE_OTHER): Payer: Medicare PPO

## 2023-06-12 DIAGNOSIS — M2042 Other hammer toe(s) (acquired), left foot: Secondary | ICD-10-CM | POA: Diagnosis not present

## 2023-06-12 DIAGNOSIS — M2141 Flat foot [pes planus] (acquired), right foot: Secondary | ICD-10-CM

## 2023-06-12 DIAGNOSIS — L84 Corns and callosities: Secondary | ICD-10-CM

## 2023-06-12 DIAGNOSIS — E1140 Type 2 diabetes mellitus with diabetic neuropathy, unspecified: Secondary | ICD-10-CM | POA: Diagnosis not present

## 2023-06-12 DIAGNOSIS — M14671 Charcot's joint, right ankle and foot: Secondary | ICD-10-CM

## 2023-06-12 NOTE — Progress Notes (Signed)

## 2023-06-17 DIAGNOSIS — G4733 Obstructive sleep apnea (adult) (pediatric): Secondary | ICD-10-CM | POA: Diagnosis not present

## 2023-06-24 DIAGNOSIS — E7800 Pure hypercholesterolemia, unspecified: Secondary | ICD-10-CM | POA: Diagnosis not present

## 2023-06-24 DIAGNOSIS — E1121 Type 2 diabetes mellitus with diabetic nephropathy: Secondary | ICD-10-CM | POA: Diagnosis not present

## 2023-06-24 DIAGNOSIS — E1165 Type 2 diabetes mellitus with hyperglycemia: Secondary | ICD-10-CM | POA: Diagnosis not present

## 2023-06-24 DIAGNOSIS — E11621 Type 2 diabetes mellitus with foot ulcer: Secondary | ICD-10-CM | POA: Diagnosis not present

## 2023-06-24 DIAGNOSIS — I10 Essential (primary) hypertension: Secondary | ICD-10-CM | POA: Diagnosis not present

## 2023-06-24 DIAGNOSIS — G4733 Obstructive sleep apnea (adult) (pediatric): Secondary | ICD-10-CM | POA: Diagnosis not present

## 2023-06-24 DIAGNOSIS — Z6836 Body mass index (BMI) 36.0-36.9, adult: Secondary | ICD-10-CM | POA: Diagnosis not present

## 2023-06-24 DIAGNOSIS — I25119 Atherosclerotic heart disease of native coronary artery with unspecified angina pectoris: Secondary | ICD-10-CM | POA: Diagnosis not present

## 2023-07-01 ENCOUNTER — Other Ambulatory Visit: Payer: Self-pay | Admitting: Podiatry

## 2023-07-08 DIAGNOSIS — Z6836 Body mass index (BMI) 36.0-36.9, adult: Secondary | ICD-10-CM | POA: Diagnosis not present

## 2023-07-08 DIAGNOSIS — G4733 Obstructive sleep apnea (adult) (pediatric): Secondary | ICD-10-CM | POA: Diagnosis not present

## 2023-07-08 DIAGNOSIS — E1165 Type 2 diabetes mellitus with hyperglycemia: Secondary | ICD-10-CM | POA: Diagnosis not present

## 2023-07-09 ENCOUNTER — Other Ambulatory Visit: Payer: Medicare PPO

## 2023-07-18 DIAGNOSIS — G4733 Obstructive sleep apnea (adult) (pediatric): Secondary | ICD-10-CM | POA: Diagnosis not present

## 2023-07-20 ENCOUNTER — Ambulatory Visit: Payer: Medicare PPO | Admitting: Podiatry

## 2023-07-20 ENCOUNTER — Encounter: Payer: Self-pay | Admitting: Podiatry

## 2023-07-20 DIAGNOSIS — B351 Tinea unguium: Secondary | ICD-10-CM

## 2023-07-20 DIAGNOSIS — M79675 Pain in left toe(s): Secondary | ICD-10-CM

## 2023-07-20 DIAGNOSIS — E1140 Type 2 diabetes mellitus with diabetic neuropathy, unspecified: Secondary | ICD-10-CM

## 2023-07-20 DIAGNOSIS — G4733 Obstructive sleep apnea (adult) (pediatric): Secondary | ICD-10-CM | POA: Diagnosis not present

## 2023-07-20 DIAGNOSIS — M79674 Pain in right toe(s): Secondary | ICD-10-CM | POA: Diagnosis not present

## 2023-07-20 DIAGNOSIS — L97511 Non-pressure chronic ulcer of other part of right foot limited to breakdown of skin: Secondary | ICD-10-CM

## 2023-07-20 MED ORDER — DOXYCYCLINE HYCLATE 100 MG PO TABS: 100.0000 mg | ORAL_TABLET | Freq: Two times a day (BID) | ORAL | 0 refills | Status: DC

## 2023-07-20 NOTE — Progress Notes (Unsigned)
   Chief Complaint  Patient presents with   [facility]    RM#12 [facility]  ULCER TRIM      68 y.o. male presents the office today with above concerns.  Since his nails are thickened elongated he cannot do them himself but he also has noticed the area in the right foot is gotten thicker around where he had a wound previously.  Denies any open lesion or any drainage or pus.  No fevers or chills.    Objective:   General: AAO x3, NAD  Dermatological: Nails are hypertrophic, dystrophic, brittle, discolored, elongated 9. No surrounding redness or drainage. Tenderness nails 1-5 bilaterally, except for the left second toe which has been partially amputated.  Ulnar right medial plantar midfoot is thick hyperkeratotic lesion we have the wound up previously.  Upon debridement there is preulcerative and there is some localized edema present but there is no drainage or pus.  There is no fluctuation crepitation but there is no ascending size.  There is no.  Vascular: DP/PT pulses palpable.  There is no pain with calf compression, swelling, warmth, erythema.   Neruologic: Sensation decreased  Musculoskeletal: Significant flatfoot is present.  Hammertoes are present; proximal left third toe hammertoe previous partial second toe amputation  Gait: Unassisted, Nonantalgic.    Assessment:   Significant flatfoot with history of bilateral ulcerations which have healed; symptomatic onychomycosis  Plan:  Patient was evaluated and treated and all questions answered.   Right midfoot preulcerative lesion with localized edema -Sharp debridement of hyperkeratotic lesion x 1 without any complications or bleeding.Marland Kitchen  Has macerated tissue underneath the callus but there is no definitive skin breakdown otherwise. -Given his history of infection and localized edema will start doxycycline. -Monitor for any clinical signs or symptoms of infection and directed to call the office immediately should any occur or go to the  ER.  Symptomatic onychomycosis -Sharply debrided nails x 9 without any complications or bleeding.  Return in about 2 weeks (around 08/03/2023) for right foot ulcer.  [name]  -- Greater offload added to right charcot to grater reduce pressure at medial mid foot   Patient will watch and will call if any negative changes occur   Addison Bailey Cped, CFo, CFm

## 2023-08-03 DIAGNOSIS — H5310 Unspecified subjective visual disturbances: Secondary | ICD-10-CM | POA: Diagnosis not present

## 2023-08-03 DIAGNOSIS — H02831 Dermatochalasis of right upper eyelid: Secondary | ICD-10-CM | POA: Diagnosis not present

## 2023-08-03 DIAGNOSIS — H43812 Vitreous degeneration, left eye: Secondary | ICD-10-CM | POA: Diagnosis not present

## 2023-08-03 DIAGNOSIS — H02834 Dermatochalasis of left upper eyelid: Secondary | ICD-10-CM | POA: Diagnosis not present

## 2023-08-04 ENCOUNTER — Encounter: Payer: Self-pay | Admitting: Podiatry

## 2023-08-04 ENCOUNTER — Ambulatory Visit: Admitting: Podiatry

## 2023-08-04 DIAGNOSIS — L84 Corns and callosities: Secondary | ICD-10-CM

## 2023-08-04 DIAGNOSIS — M2041 Other hammer toe(s) (acquired), right foot: Secondary | ICD-10-CM

## 2023-08-04 NOTE — Progress Notes (Signed)
   Chief Complaint  Patient presents with   Wound Check    RM#13 Right foot wound check patient states would like toes evaluated as he has red areas on toes due to shoes rubbing present for about 1 week.    68 y.o. male presents the office today with above concerns.  States he has been doing well.  Denies any skin breakdown on the area the previous wounds appear to be healing well.  He has new areas in the tops of his toes with his shoes up and rubbing.  Denies any drainage or pus.     Objective:   General: AAO x3, NAD  Dermatological: On plantar medial aspect of right foot is a hyperkeratotic lesion on the area the previous wound.  Upon debridement there is no underlying ulceration, drainage or signs of infection.  The wound is healed.  There are new superficial abrasion type lesions noted on the dorsal aspect of the right 3rd and 4th toes without any edema, erythema, drainage or pus or signs of infection today.   Vascular: DP/PT pulses palpable.  There is no pain with calf compression, swelling, warmth, erythema.   Neruologic: Sensation decreased  Musculoskeletal: Significant flatfoot is present.  Hammertoes are present; proximal left third toe hammertoe previous partial second toe amputation  Gait: Unassisted, Nonantalgic.    Assessment:   Significant flatfoot with history of bilateral ulcerations which have healed; symptomatic onychomycosis  Plan:  Patient was evaluated and treated and all questions answered.   Right midfoot preulcerative lesion with localized edema -Sharp debridement of hyperkeratotic lesion x 1 without any complications or bleeding.  Appears the wound is healed.  Continue moisturizer, offloading.  Superficial wounds lesser toes right foot - Superficial wounds likely coming from the rubbing inside shoes.  His inserts were modified by Kerney Pee, pedorthist to decrease pressure.  Keep a Band-Aid on the area for padding.  Monitor any signs or symptoms of  infection.  Medical next couple weeks or me know or sooner if there is any worsening or changes.  Return in about 7 weeks (around 09/22/2023).  Charity Conch DPM

## 2023-08-09 ENCOUNTER — Encounter: Payer: Self-pay | Admitting: Podiatry

## 2023-08-10 ENCOUNTER — Other Ambulatory Visit: Payer: Self-pay | Admitting: Podiatry

## 2023-08-10 MED ORDER — PREGABALIN 150 MG PO CAPS: 150.0000 mg | ORAL_CAPSULE | Freq: Two times a day (BID) | ORAL | 0 refills | Status: DC

## 2023-08-11 DIAGNOSIS — E1165 Type 2 diabetes mellitus with hyperglycemia: Secondary | ICD-10-CM | POA: Diagnosis not present

## 2023-08-11 DIAGNOSIS — G4733 Obstructive sleep apnea (adult) (pediatric): Secondary | ICD-10-CM | POA: Diagnosis not present

## 2023-08-11 DIAGNOSIS — Z6837 Body mass index (BMI) 37.0-37.9, adult: Secondary | ICD-10-CM | POA: Diagnosis not present

## 2023-08-13 ENCOUNTER — Ambulatory Visit: Admitting: Podiatry

## 2023-08-17 DIAGNOSIS — H43812 Vitreous degeneration, left eye: Secondary | ICD-10-CM | POA: Diagnosis not present

## 2023-08-24 ENCOUNTER — Other Ambulatory Visit: Payer: Self-pay | Admitting: Podiatry

## 2023-08-25 ENCOUNTER — Encounter: Payer: Self-pay | Admitting: Podiatry

## 2023-08-26 NOTE — Telephone Encounter (Signed)
 Can we schedule him to come in? Thanks!

## 2023-08-28 ENCOUNTER — Ambulatory Visit: Admitting: Podiatry

## 2023-08-28 DIAGNOSIS — T148XXA Other injury of unspecified body region, initial encounter: Secondary | ICD-10-CM

## 2023-08-28 NOTE — Progress Notes (Signed)
   Chief Complaint  Patient presents with   Wound Check    Patient is here for wound check on right foot, no pain just some intermittent bleeding.    68 y.o. male presents the office today with above concerns.  States he has been doing well.  No acute complaints.  He states he has tried some new shoes may have rubbed it a little bit he noticed some bleeding so wanted to get checked out.  Objective:   General: AAO x3, NAD  Dermatological: On plantar medial aspect of right foot is a hyperkeratotic lesion on the area the previous wound.  Upon debridement there is no underlying ulceration, drainage or signs of infection.  The wound is healed.  There are new superficial abrasion type lesions noted on the dorsal aspect of the right 3rd and 4th toes without any edema, erythema, drainage or pus or signs of infection today.   Vascular: DP/PT pulses palpable.  There is no pain with calf compression, swelling, warmth, erythema.   Neruologic: Sensation decreased  Musculoskeletal: Significant flatfoot is present.  Hammertoes are present; proximal left third toe hammertoe previous partial second toe amputation  Gait: Unassisted, Nonantalgic.    Assessment:   Significant flatfoot with history of Right TN symptomatic onychomycosis  Plan:  Patient was evaluated and treated and all questions answered.   Right midfoot preulcerative lesion with localized edema -Sharp debridement of hyperkeratotic lesion x 1 without any complications or bleeding.  No wound noted.  No deep opening noted no signs of infection noted.  I discussed shoe gear modification as the shoes that he wore on Sunday may have led to some rubbing against the prominent spot.  I discussed with him to immediately change his shoes he states understanding - He is already scheduled to see Dr. Clydia Dart in the future for monitoring    No follow-ups on file.

## 2023-09-10 DIAGNOSIS — G4733 Obstructive sleep apnea (adult) (pediatric): Secondary | ICD-10-CM | POA: Diagnosis not present

## 2023-09-10 DIAGNOSIS — E1165 Type 2 diabetes mellitus with hyperglycemia: Secondary | ICD-10-CM | POA: Diagnosis not present

## 2023-09-10 DIAGNOSIS — Z6837 Body mass index (BMI) 37.0-37.9, adult: Secondary | ICD-10-CM | POA: Diagnosis not present

## 2023-09-22 ENCOUNTER — Encounter: Payer: Self-pay | Admitting: Podiatry

## 2023-09-22 ENCOUNTER — Ambulatory Visit: Admitting: Podiatry

## 2023-09-22 DIAGNOSIS — M79675 Pain in left toe(s): Secondary | ICD-10-CM

## 2023-09-22 DIAGNOSIS — M79674 Pain in right toe(s): Secondary | ICD-10-CM | POA: Diagnosis not present

## 2023-09-22 DIAGNOSIS — B351 Tinea unguium: Secondary | ICD-10-CM

## 2023-09-22 DIAGNOSIS — E1140 Type 2 diabetes mellitus with diabetic neuropathy, unspecified: Secondary | ICD-10-CM | POA: Diagnosis not present

## 2023-09-22 DIAGNOSIS — L84 Corns and callosities: Secondary | ICD-10-CM | POA: Diagnosis not present

## 2023-09-22 NOTE — Progress Notes (Signed)
   Chief Complaint  Patient presents with   Wound Check    RM#11 Wound check right foot no drainage patient states shoes causing discomfort.    68 y.o. male presents the office today with above concerns.  He states that he thinks that the wound opened up after he was wearing a different pair shoes on a worksite.  He states has been doing much better.  Denies any swelling or redness or any drainage.  No open lesions at this time.   His nails are also thickened discolored he has difficulty trimming them.  No swelling or redness or drainage at the toenail sites.    Objective:   General: AAO x3, NAD  Dermatological: On plantar aspect the right foot is a hyperkeratotic lesion with a minimal amount of dried blood underneath the callus.  After debridement there is no underlying ulceration, drainage or any signs of infection noted. Nails are hypertrophic, dystrophic with yellow, brown discoloration to nails 1 through 5 on the right 1 through 5 on the left except for the second toe which is partially amputated.  No edema, erythema or any open lesions.  Vascular: DP/PT pulses palpable.  There is no pain with calf compression, swelling, warmth, erythema.   Neruologic: Sensation decreased  Musculoskeletal: Significant flatfoot is present.  Hammertoes are present; proximal left third toe hammertoe previous partial second toe amputation  Gait: Unassisted, Nonantalgic.    Assessment:   Preulcerative callus; symptomatic onychomycosis  Plan:  Patient was evaluated and treated and all questions answered.   Right midfoot preulcerative lesion with localized edema -Sharp debridement of hyperkeratotic lesion x 1 without any complications or bleeding.  Appears the wound is healed.  Continue moisturizer, offloading. -He is having issues with his diabetic shoes.  When I try to use the insert but in a different shoe he gets better support and not rubbing.  Symptomatic onychomycosis -Sharp debrided  nails x 9 without any complications or bleeding  Return for pre-ulcerative callus 4-6 weeks.  Charity Conch DPM

## 2023-09-25 ENCOUNTER — Other Ambulatory Visit: Payer: Self-pay | Admitting: Cardiovascular Disease

## 2023-10-08 DIAGNOSIS — G4733 Obstructive sleep apnea (adult) (pediatric): Secondary | ICD-10-CM | POA: Diagnosis not present

## 2023-10-12 ENCOUNTER — Encounter: Payer: Self-pay | Admitting: Podiatry

## 2023-10-12 NOTE — Telephone Encounter (Signed)
 Can we please schedule him an appt? Thanks!

## 2023-10-14 ENCOUNTER — Ambulatory Visit: Admitting: Podiatry

## 2023-10-14 DIAGNOSIS — E08621 Diabetes mellitus due to underlying condition with foot ulcer: Secondary | ICD-10-CM

## 2023-10-14 DIAGNOSIS — L97512 Non-pressure chronic ulcer of other part of right foot with fat layer exposed: Secondary | ICD-10-CM

## 2023-10-19 ENCOUNTER — Other Ambulatory Visit: Payer: Self-pay | Admitting: Podiatry

## 2023-10-19 DIAGNOSIS — G4733 Obstructive sleep apnea (adult) (pediatric): Secondary | ICD-10-CM | POA: Diagnosis not present

## 2023-10-19 NOTE — Progress Notes (Signed)
 Chief Complaint  Patient presents with   Foot Ulcer    RM#6 Right foot ulcer on bottom of foot and third toe showing signs of infection after being in the pool.Foot showing signs of infection swelling and redness.    Subjective:  68 y.o. male with PMHx of diabetes mellitus, history of diabetic foot ulcers, presenting for new onset of an ulcer that developed to the plantar aspect of the right foot as well as the right third toe.  Patient states that he was walking around in the pool barefoot.   Past Medical History:  Diagnosis Date   Arthritis    ankles, knees (01/29/2018)   CAD in native artery    cath report from 02/2017 indicates patient had a history of RCA stent >10 years prior to that. At time of that cath he was found to have EF 70%, 20-30% ostial LAD, 30-40% prox LAD, 30-40% Cx, 80-90% Cx beyond OM1, totally occluded RCA with left-to-right collaterals.   Cellulitis 03/03/2022   Diabetes mellitus type 2 in obese    GERD (gastroesophageal reflux disease)    Hyperlipidemia    Hypertension    Morbid obesity (HCC)    Onychomycosis 03/03/2022   OSA on CPAP     Past Surgical History:  Procedure Laterality Date   AMPUTATION Left 09/08/2022   Procedure: AMPUTATION LEFT 2nd TOE;  Surgeon: Silva Juliene SAUNDERS, DPM;  Location: [facility] OR;  Service: Podiatry;  Laterality: Left;   CARDIAC CATHETERIZATION  2008   CORONARY ANGIOPLASTY WITH STENT PLACEMENT  1997   JOINT REPLACEMENT     KNEE ARTHROSCOPY Bilateral    meniscus repairs   TONSILLECTOMY     TOTAL KNEE ARTHROPLASTY Left 01/29/2018   TOTAL KNEE ARTHROPLASTY Left 01/29/2018   Procedure: LEFT TOTAL KNEE ARTHROPLASTY;  Surgeon: Jerri Kay HERO, MD;  Location: [facility] OR;  Service: Orthopedics;  Laterality: Left;   UVULOPALATOPHARYNGOPLASTY  ~ 1994    Allergies  Allergen Reactions   Metformin Other (See Comments)   Penicillins Rash    Has patient had a PCN reaction causing immediate rash, facial/tongue/throat swelling, SOB or  lightheadedness with hypotension: Yes Has patient had a PCN reaction causing severe rash involving mucus membranes or skin necrosis: Unk Has patient had a PCN reaction that required hospitalization: Unk Has patient had a PCN reaction occurring within the last 10 years: No If all of the above answers are NO, then may proceed with Cephalosporin use.       RT foot 10/14/2023  Objective/Physical Exam General: The patient is alert and oriented x3 in no acute distress.  Dermatology:  Wound #1 noted to the plantar aspect of the first MTP of the right foot measuring approximately 2.0 x 2.0 x 0.2 cm (LxWxD).   To the noted ulceration(s), there is no eschar. There is a moderate amount of slough, fibrin, and necrotic tissue noted. Granulation tissue and wound base is red. There is a minimal amount of serosanguineous drainage noted. There is no exposed bone muscle-tendon ligament or joint. There is no malodor. Periwound integrity is intact. Skin is warm, dry and supple bilateral lower extremities.  There is also some mild localized erythema around the distal tip of the right third toe.  Vascular: Palpable pedal pulses bilaterally.  Mild localized erythema at the right third toe and mildly throughout the forefoot capillary refill within normal limits.  Neurological: Light touch and protective threshold diminished bilaterally.   Musculoskeletal Exam: Range of motion within normal limits to all pedal and  ankle joints bilateral. Muscle strength 5/5.  Patient ambulatory.  No prior amputations.  Pes planovalgus deformity noted  Assessment: 1.  Ulcer plantar aspect of the right foot secondary to diabetes mellitus 2. diabetes mellitus w/ peripheral neuropathy   Plan of Care:  -Patient was evaluated. -Medically necessary excisional debridement including subcutaneous tissue was performed using a tissue nipper and a chisel blade. Excisional debridement of all the necrotic nonviable tissue down to  healthy bleeding viable tissue was performed with post-debridement measurements same as pre-. -Continue mupirocin ointment with daily dressing changes -Patient is currently taking oral doxycycline  previously prescribed.  Continue  -Recommend going to the emergency department if condition worsens or if he begins to feel any systemic signs of infection such as fever chills nausea shortness of breath or generalized malaise -Advised against going barefoot.  For now refrain from pool -Follow-up next week in office.  Both myself and Dr. Gershon will be out of town, we will have him follow-up with any available provider  Thresa EMERSON Sar, DPM [facility]  Dr. Thresa EMERSON Sar, DPM    [address] [place], [place] [address]                Office [phone]  Fax [phone]

## 2023-10-21 ENCOUNTER — Ambulatory Visit: Admitting: Podiatry

## 2023-10-21 VITALS — Ht 68.0 in | Wt 240.0 lb

## 2023-10-21 DIAGNOSIS — L97512 Non-pressure chronic ulcer of other part of right foot with fat layer exposed: Secondary | ICD-10-CM | POA: Diagnosis not present

## 2023-10-21 DIAGNOSIS — E08621 Diabetes mellitus due to underlying condition with foot ulcer: Secondary | ICD-10-CM

## 2023-10-22 NOTE — Progress Notes (Signed)
  Subjective:  Patient ID: [name], male    DOB: 1956/02/12,  MRN: [id number]  Chief Complaint  Patient presents with   Toe Pain    Rm 8  Return in about 1 week (around 10/21/2023) for f/u RT 3rd toe cellulitis and wound on the bottom of right foot near hallux. Wounds are healing well, no redness, swelling or foul drainage. Patient last changed bandages this morning.      68 y.o. male presents with the above complaint. History confirmed with patient.  Doing much better after the antibiotics  Objective:  Physical Exam: warm, good capillary refill, normal DP and PT pulses, and peripheral neuropathy, distal third tip ulceration and plantar submetatarsal 1 ulceration measured 0.6 x 0.3 x 0.3 cm for the toe and 0.8 x 0.3 x 0.3 cm for the metatarsal.      Assessment:   1. Diabetic ulcer of other part of right foot associated with diabetes mellitus due to underlying condition, with fat layer exposed (HCC)      Plan:  Patient was evaluated and treated and all questions answered.  Ulcer right third toe and submetatarsal 1 - Much improved.  Infection appears to be resolved.  Notify me if this returns.  Scheduled to see Dr. Gershon next week. -Debridement as below. -Dressed with Silvadene, DSD. -Continue home dressing changes daily with 4 x 4 gauze and Silvadene   Procedure: Excisional Debridement of Wound Rationale: Removal of non-viable soft tissue from the wound to promote healing.  Anesthesia: none Post-Debridement Wound Measurements: Noted above Type of Debridement: Sharp Excisional Tissue Removed: Non-viable soft tissue Depth of Debridement: subcutaneous tissue. Technique: Sharp excisional debridement to bleeding, viable wound base.  Dressing: Dry, sterile, compression dressing. Disposition: Patient tolerated procedure well.    No follow-ups on file.       No follow-ups on file.

## 2023-10-27 ENCOUNTER — Encounter: Payer: Self-pay | Admitting: Podiatry

## 2023-10-27 ENCOUNTER — Inpatient Hospital Stay (HOSPITAL_COMMUNITY)
Admission: EM | Admit: 2023-10-27 | Discharge: 2023-10-29 | DRG: 617 | Disposition: A | Discharge status: Home / Self Care | Attending: Internal Medicine | Admitting: Internal Medicine

## 2023-10-27 ENCOUNTER — Encounter (HOSPITAL_COMMUNITY): Payer: Self-pay | Admitting: Pharmacy Technician

## 2023-10-27 ENCOUNTER — Ambulatory Visit: Admitting: Podiatry

## 2023-10-27 ENCOUNTER — Ambulatory Visit (INDEPENDENT_AMBULATORY_CARE_PROVIDER_SITE_OTHER)

## 2023-10-27 ENCOUNTER — Other Ambulatory Visit: Payer: Self-pay

## 2023-10-27 ENCOUNTER — Emergency Department (HOSPITAL_COMMUNITY)

## 2023-10-27 VITALS — BP 140/80 | HR 63 | Temp 97.6°F | Resp 18 | Ht 68.0 in | Wt 240.0 lb

## 2023-10-27 DIAGNOSIS — L02611 Cutaneous abscess of right foot: Secondary | ICD-10-CM | POA: Diagnosis not present

## 2023-10-27 DIAGNOSIS — Z955 Presence of coronary angioplasty implant and graft: Secondary | ICD-10-CM

## 2023-10-27 DIAGNOSIS — L03031 Cellulitis of right toe: Secondary | ICD-10-CM

## 2023-10-27 DIAGNOSIS — K219 Gastro-esophageal reflux disease without esophagitis: Secondary | ICD-10-CM | POA: Diagnosis present

## 2023-10-27 DIAGNOSIS — I2510 Atherosclerotic heart disease of native coronary artery without angina pectoris: Secondary | ICD-10-CM | POA: Diagnosis present

## 2023-10-27 DIAGNOSIS — I10 Essential (primary) hypertension: Secondary | ICD-10-CM | POA: Diagnosis not present

## 2023-10-27 DIAGNOSIS — Z7985 Long-term (current) use of injectable non-insulin antidiabetic drugs: Secondary | ICD-10-CM

## 2023-10-27 DIAGNOSIS — E1169 Type 2 diabetes mellitus with other specified complication: Secondary | ICD-10-CM | POA: Diagnosis not present

## 2023-10-27 DIAGNOSIS — G4733 Obstructive sleep apnea (adult) (pediatric): Secondary | ICD-10-CM | POA: Diagnosis present

## 2023-10-27 DIAGNOSIS — Z96652 Presence of left artificial knee joint: Secondary | ICD-10-CM | POA: Diagnosis present

## 2023-10-27 DIAGNOSIS — E785 Hyperlipidemia, unspecified: Secondary | ICD-10-CM | POA: Diagnosis not present

## 2023-10-27 DIAGNOSIS — R609 Edema, unspecified: Secondary | ICD-10-CM | POA: Diagnosis not present

## 2023-10-27 DIAGNOSIS — E1140 Type 2 diabetes mellitus with diabetic neuropathy, unspecified: Secondary | ICD-10-CM | POA: Diagnosis not present

## 2023-10-27 DIAGNOSIS — Z880 Allergy status to penicillin: Secondary | ICD-10-CM

## 2023-10-27 DIAGNOSIS — E1165 Type 2 diabetes mellitus with hyperglycemia: Secondary | ICD-10-CM | POA: Diagnosis present

## 2023-10-27 DIAGNOSIS — L97519 Non-pressure chronic ulcer of other part of right foot with unspecified severity: Secondary | ICD-10-CM | POA: Diagnosis not present

## 2023-10-27 DIAGNOSIS — E66812 Obesity, class 2: Secondary | ICD-10-CM | POA: Diagnosis present

## 2023-10-27 DIAGNOSIS — Z6837 Body mass index (BMI) 37.0-37.9, adult: Secondary | ICD-10-CM | POA: Diagnosis not present

## 2023-10-27 DIAGNOSIS — Z79899 Other long term (current) drug therapy: Secondary | ICD-10-CM

## 2023-10-27 DIAGNOSIS — Z8249 Family history of ischemic heart disease and other diseases of the circulatory system: Secondary | ICD-10-CM

## 2023-10-27 DIAGNOSIS — Z7984 Long term (current) use of oral hypoglycemic drugs: Secondary | ICD-10-CM

## 2023-10-27 DIAGNOSIS — M14679 Charcot's joint, unspecified ankle and foot: Secondary | ICD-10-CM | POA: Diagnosis not present

## 2023-10-27 DIAGNOSIS — Z89421 Acquired absence of other right toe(s): Secondary | ICD-10-CM | POA: Diagnosis not present

## 2023-10-27 DIAGNOSIS — M86171 Other acute osteomyelitis, right ankle and foot: Secondary | ICD-10-CM | POA: Diagnosis not present

## 2023-10-27 DIAGNOSIS — M869 Osteomyelitis, unspecified: Secondary | ICD-10-CM | POA: Diagnosis not present

## 2023-10-27 DIAGNOSIS — M19071 Primary osteoarthritis, right ankle and foot: Secondary | ICD-10-CM | POA: Diagnosis not present

## 2023-10-27 LAB — CBG MONITORING, ED: Glucose-Capillary: 165 mg/dL — ABNORMAL HIGH (ref 70–99)

## 2023-10-27 LAB — COMPREHENSIVE METABOLIC PANEL WITH GFR
ALT: 26 U/L (ref 0–44)
AST: 24 U/L (ref 15–41)
Albumin: 3.6 g/dL (ref 3.5–5.0)
Alkaline Phosphatase: 59 U/L (ref 38–126)
Anion gap: 13 (ref 5–15)
BUN: 17 mg/dL (ref 8–23)
CO2: 23 mmol/L (ref 22–32)
Calcium: 9.3 mg/dL (ref 8.9–10.3)
Chloride: 103 mmol/L (ref 98–111)
Creatinine, Ser: 1.18 mg/dL (ref 0.61–1.24)
GFR, Estimated: >60 mL/min (ref >60)
Glucose, Bld: 244 mg/dL — ABNORMAL HIGH (ref 70–99)
Potassium: 3.9 mmol/L (ref 3.5–5.1)
Sodium: 139 mmol/L (ref 135–145)
Total Bilirubin: 1 mg/dL (ref 0.0–1.2)
Total Protein: 6.7 g/dL (ref 6.5–8.1)

## 2023-10-27 LAB — CBC WITH DIFFERENTIAL/PLATELET
Abs Immature Granulocytes: 0.03 K/uL (ref 0.00–0.07)
Basophils Absolute: 0.1 K/uL (ref 0.0–0.1)
Basophils Relative: 1 %
Eosinophils Absolute: 0.3 K/uL (ref 0.0–0.5)
Eosinophils Relative: 4 %
HCT: 50.7 % (ref 39.0–52.0)
Hemoglobin: 17.2 g/dL — ABNORMAL HIGH (ref 13.0–17.0)
Immature Granulocytes: 0 %
Lymphocytes Relative: 19 %
Lymphs Abs: 1.4 K/uL (ref 0.7–4.0)
MCH: 30.1 pg (ref 26.0–34.0)
MCHC: 33.9 g/dL (ref 30.0–36.0)
MCV: 88.6 fL (ref 80.0–100.0)
Monocytes Absolute: 0.5 K/uL (ref 0.1–1.0)
Monocytes Relative: 7 %
Neutro Abs: 4.8 K/uL (ref 1.7–7.7)
Neutrophils Relative %: 69 %
Platelets: 148 K/uL — ABNORMAL LOW (ref 150–400)
RBC: 5.72 MIL/uL (ref 4.22–5.81)
RDW: 14.8 % (ref 11.5–15.5)
WBC: 7 K/uL (ref 4.0–10.5)
nRBC: 0 % (ref 0.0–0.2)

## 2023-10-27 LAB — CBC
HCT: 48.7 % (ref 39.0–52.0)
Hemoglobin: 16.2 g/dL (ref 13.0–17.0)
MCH: 29.5 pg (ref 26.0–34.0)
MCHC: 33.3 g/dL (ref 30.0–36.0)
MCV: 88.5 fL (ref 80.0–100.0)
Platelets: 123 K/uL — ABNORMAL LOW (ref 150–400)
RBC: 5.5 MIL/uL (ref 4.22–5.81)
RDW: 15.3 % (ref 11.5–15.5)
WBC: 7.9 K/uL (ref 4.0–10.5)
nRBC: 0 % (ref 0.0–0.2)

## 2023-10-27 LAB — I-STAT CG4 LACTIC ACID, ED: Lactic Acid, Venous: 1.9 mmol/L (ref 0.5–1.9)

## 2023-10-27 LAB — HEMOGLOBIN A1C
Hgb A1c MFr Bld: 8 % — ABNORMAL HIGH (ref 4.8–5.6)
Mean Plasma Glucose: 182.9 mg/dL

## 2023-10-27 LAB — VAS US ABI WITH/WO TBI
Left ABI: 1.03
Right ABI: 1.05

## 2023-10-27 LAB — CREATININE, SERUM
Creatinine, Ser: 0.96 mg/dL (ref 0.61–1.24)
GFR, Estimated: >60 mL/min (ref >60)

## 2023-10-27 MED ORDER — ISOSORBIDE MONONITRATE ER 60 MG PO TB24
120.0000 mg | ORAL_TABLET | Freq: Every day | ORAL | Status: DC
  Administered 2023-10-29: 120 mg via ORAL
  Filled 2023-10-27: qty 2
  Filled 2023-10-27: qty 4

## 2023-10-27 MED ORDER — ALBUTEROL SULFATE (2.5 MG/3ML) 0.083% IN NEBU: 2.5000 mg | INHALATION_SOLUTION | RESPIRATORY_TRACT | Status: DC | PRN

## 2023-10-27 MED ORDER — SODIUM CHLORIDE 0.9 % IV SOLN: INTRAVENOUS | Status: AC

## 2023-10-27 MED ORDER — ONDANSETRON HCL 4 MG PO TABS: 4.0000 mg | ORAL_TABLET | Freq: Four times a day (QID) | ORAL | Status: DC | PRN

## 2023-10-27 MED ORDER — EMPAGLIFLOZIN 25 MG PO TABS
25.0000 mg | ORAL_TABLET | Freq: Every day | ORAL | Status: DC
  Administered 2023-10-29: 25 mg via ORAL
  Filled 2023-10-27 (×2): qty 1

## 2023-10-27 MED ORDER — EZETIMIBE 10 MG PO TABS
10.0000 mg | ORAL_TABLET | Freq: Every day | ORAL | Status: DC
  Administered 2023-10-29: 10 mg via ORAL
  Filled 2023-10-27 (×2): qty 1

## 2023-10-27 MED ORDER — VANCOMYCIN HCL 2000 MG/400ML IV SOLN
2000.0000 mg | Freq: Once | INTRAVENOUS | Status: AC
  Administered 2023-10-27: 2000 mg via INTRAVENOUS
  Filled 2023-10-27: qty 400

## 2023-10-27 MED ORDER — ATORVASTATIN CALCIUM 80 MG PO TABS
80.0000 mg | ORAL_TABLET | Freq: Every day | ORAL | Status: DC
  Administered 2023-10-28: 80 mg via ORAL
  Filled 2023-10-27: qty 1

## 2023-10-27 MED ORDER — PREGABALIN 75 MG PO CAPS
150.0000 mg | ORAL_CAPSULE | Freq: Two times a day (BID) | ORAL | Status: DC
  Administered 2023-10-27 – 2023-10-29 (×3): 150 mg via ORAL
  Filled 2023-10-27 (×4): qty 2

## 2023-10-27 MED ORDER — ACETAMINOPHEN 650 MG RE SUPP: 650.0000 mg | Freq: Four times a day (QID) | RECTAL | Status: DC | PRN

## 2023-10-27 MED ORDER — VANCOMYCIN HCL IN DEXTROSE 1-5 GM/200ML-% IV SOLN
1000.0000 mg | Freq: Two times a day (BID) | INTRAVENOUS | Status: DC
  Administered 2023-10-28 – 2023-10-29 (×2): 1000 mg via INTRAVENOUS
  Filled 2023-10-27 (×2): qty 200

## 2023-10-27 MED ORDER — INSULIN ASPART 100 UNIT/ML IJ SOLN
0.0000 [IU] | INTRAMUSCULAR | Status: DC
  Administered 2023-10-27 – 2023-10-28 (×2): 1 [IU] via SUBCUTANEOUS

## 2023-10-27 MED ORDER — SODIUM CHLORIDE 0.9 % IV SOLN
2.0000 g | INTRAVENOUS | Status: DC
  Administered 2023-10-27 – 2023-10-28 (×2): 2 g via INTRAVENOUS
  Filled 2023-10-27 (×2): qty 20

## 2023-10-27 MED ORDER — METOPROLOL SUCCINATE ER 50 MG PO TB24
100.0000 mg | ORAL_TABLET | Freq: Every day | ORAL | Status: DC
  Administered 2023-10-29: 100 mg via ORAL
  Filled 2023-10-27: qty 4
  Filled 2023-10-27: qty 2

## 2023-10-27 MED ORDER — METRONIDAZOLE 500 MG/100ML IV SOLN
500.0000 mg | Freq: Two times a day (BID) | INTRAVENOUS | Status: DC
  Administered 2023-10-27 – 2023-10-29 (×4): 500 mg via INTRAVENOUS
  Filled 2023-10-27 (×4): qty 100

## 2023-10-27 MED ORDER — PANTOPRAZOLE SODIUM 40 MG PO TBEC
40.0000 mg | DELAYED_RELEASE_TABLET | Freq: Every day | ORAL | Status: DC
  Administered 2023-10-29: 40 mg via ORAL
  Filled 2023-10-27 (×2): qty 1

## 2023-10-27 MED ORDER — NITROGLYCERIN 0.4 MG SL SUBL: 0.4000 mg | SUBLINGUAL_TABLET | SUBLINGUAL | Status: DC | PRN

## 2023-10-27 MED ORDER — ONDANSETRON HCL 4 MG/2ML IJ SOLN: 4.0000 mg | Freq: Four times a day (QID) | INTRAMUSCULAR | Status: DC | PRN

## 2023-10-27 MED ORDER — SENNOSIDES-DOCUSATE SODIUM 8.6-50 MG PO TABS: 1.0000 | ORAL_TABLET | Freq: Every evening | ORAL | Status: DC | PRN

## 2023-10-27 MED ORDER — ACETAMINOPHEN 325 MG PO TABS: 650.0000 mg | ORAL_TABLET | Freq: Four times a day (QID) | ORAL | Status: DC | PRN

## 2023-10-27 MED ORDER — OXYCODONE HCL 5 MG PO TABS
10.0000 mg | ORAL_TABLET | Freq: Four times a day (QID) | ORAL | Status: DC | PRN
  Administered 2023-10-28 – 2023-10-29 (×2): 10 mg via ORAL
  Filled 2023-10-27 (×2): qty 2

## 2023-10-27 MED ORDER — COLESEVELAM HCL 625 MG PO TABS
1875.0000 mg | ORAL_TABLET | Freq: Every day | ORAL | Status: DC
  Administered 2023-10-27 – 2023-10-28 (×2): 1875 mg via ORAL
  Filled 2023-10-27 (×2): qty 3

## 2023-10-27 MED ORDER — HEPARIN SODIUM (PORCINE) 5000 UNIT/ML IJ SOLN
5000.0000 [IU] | Freq: Three times a day (TID) | INTRAMUSCULAR | Status: DC
  Administered 2023-10-27 – 2023-10-29 (×5): 5000 [IU] via SUBCUTANEOUS
  Filled 2023-10-27 (×5): qty 1

## 2023-10-27 NOTE — ED Notes (Signed)
 IV team at bedside

## 2023-10-27 NOTE — Progress Notes (Signed)
 Pharmacy Antibiotic Note  [name] is a 68 y.o. male admitted on 10/27/2023 with osteomyelitis.  Pharmacy has been consulted for vancomycin  and Zosyn  dosing. MRI c/f right 3rd phalanx osteromyelitis. Amputation scheduled for tomorrow 7/16.  Plan: Initiate Vancomycin  2000mg  IV infused over 2 hours, given once as LD Initiate Vancomycin  1000mg  IV Q12h, with est AUC 566.6 Initiate ceftriaxone  2g IV daily Initiate metronidazole  500mg  IV Q12h Monitor vancomycin  peak and trough after 5th dose, CBC, Scr, and s/sx of infection improvement Evaluate opportunities to de-escalate antibiotics as indicated Follow up on surgical plans and source control      Temp (24hrs), Avg:98 F (36.7 C), Min:97.6 F (36.4 C), Max:98.3 F (36.8 C)  Recent Labs  Lab 10/27/23 1448 10/27/23 1500  WBC 7.0  --   CREATININE 1.18  --   LATICACIDVEN  --  1.9    Estimated Creatinine Clearance: 72.7 mL/min (by C-G formula based on SCr of 1.18 mg/dL).    Allergies  Allergen Reactions   Metformin Other (See Comments)   Mounjaro [Tirzepatide]    Penicillins Rash    Has patient had a PCN reaction causing immediate rash, facial/tongue/throat swelling, SOB or lightheadedness with hypotension: Yes Has patient had a PCN reaction causing severe rash involving mucus membranes or skin necrosis: Unk Has patient had a PCN reaction that required hospitalization: Unk Has patient had a PCN reaction occurring within the last 10 years: No If all of the above answers are NO, then may proceed with Cephalosporin use.     Antimicrobials this admission: none  Dose adjustments this admission: N/a  Microbiology results: 7/15 BCx: pending   Thank you for allowing pharmacy to be a part of this patient's care.  Belvie Macintosh, PharmD Candidate 10/27/2023 6:37 PM

## 2023-10-27 NOTE — ED Provider Notes (Addendum)
  EMERGENCY DEPARTMENT AT [facility] Provider Note   CSN: [id number] Arrival date & time: 10/27/23  1428     History  Chief Complaint  Patient presents with  . Wound Infection    [name] is a 68 y.o. male with PMH as listed below who presents with c/f wound infection. Sent in by podiatrist due to concern of right foot osteomyelitis.  Underlying history of diabetes, had toenail removed approximately two weeks ago. Has neuropathy in feet, doesn't complain of any pain. No f/c. Patient reports he had XR at clinic which showed possible osteo.    Past Medical History:  Diagnosis Date  . Arthritis    ankles, knees (01/29/2018)  . CAD in native artery    cath report from 02/2017 indicates patient had a history of RCA stent >10 years prior to that. At time of that cath he was found to have EF 70%, 20-30% ostial LAD, 30-40% prox LAD, 30-40% Cx, 80-90% Cx beyond OM1, totally occluded RCA with left-to-right collaterals.  . Cellulitis 03/03/2022  . Diabetes mellitus type 2 in obese   . GERD (gastroesophageal reflux disease)   . Hyperlipidemia   . Hypertension   . Morbid obesity (HCC)   . Onychomycosis 03/03/2022  . OSA on CPAP        Home Medications Prior to Admission medications   Medication Sig Start Date End Date Taking? Authorizing Provider  acetaminophen  (TYLENOL ) 650 MG CR tablet Take 1,300 mg by mouth at bedtime.    [provider]  aspirin  EC 325 MG tablet Take 325 mg by mouth at bedtime.    [provider]  atorvastatin  (LIPITOR ) 80 MG tablet Take 80 mg by mouth at bedtime.  02/09/13   [provider]  celecoxib  (CELEBREX ) 200 MG capsule TAKE 1 CAPSULE BY MOUTH EVERY DAY 10/19/23   Gershon Donnice SAUNDERS, DPM  Cholecalciferol (VITAMIN D3) 2000 units TABS Take 2,000 Units by mouth daily.    [provider]  doxycycline  (VIBRA -TABS) 100 MG tablet Take 1 tablet (100 mg total) by mouth 2 (two) times daily. 07/20/23    Gershon Donnice SAUNDERS, DPM  empagliflozin  (JARDIANCE ) 25 MG TABS tablet Take 1 tablet (25 mg total) by mouth daily. 12/08/22   Croitoru, Mihai, MD  ezetimibe  (ZETIA ) 10 MG tablet TAKE 1 TABLET BY MOUTH EVERY DAY 09/25/23   Croitoru, Jerel, MD  isosorbide  mononitrate (IMDUR ) 120 MG 24 hr tablet TAKE 1 TABLET BY MOUTH EVERY DAY 12/08/22   Croitoru, Mihai, MD  metoprolol  succinate (TOPROL -XL) 100 MG 24 hr tablet Take 1 tablet (100 mg total) by mouth daily. Take with or immediately following a meal. 12/08/22   Croitoru, Mihai, MD  Multiple Vitamin (MULTIVITAMIN) tablet Take 1 tablet by mouth daily.    [provider]  nitroGLYCERIN  (NITROSTAT ) 0.4 MG SL tablet Place 1 tablet (0.4 mg total) under the tongue every 5 (five) minutes x 3 doses as needed for chest pain. 11/24/22   Croitoru, Mihai, MD  omeprazole (PRILOSEC) 20 MG capsule Take 20 mg by mouth daily.  02/03/13   [provider]  OZEMPIC, 0.25 OR 0.5 MG/DOSE, 2 MG/3ML SOPN Inject 0.25 mg into the skin once a week. 11/11/22   [provider]  pregabalin  (LYRICA ) 150 MG capsule Take 1 capsule (150 mg total) by mouth 2 (two) times daily. 08/10/23   Gershon Donnice SAUNDERS, DPM  senna-docusate (SENOKOT S) 8.6-50 MG tablet Take 1 tablet by mouth at bedtime as needed. Patient taking  differently: Take 2 tablets by mouth at bedtime. 01/29/18   Jerri Kay HERO, MD  Tetrahydrozoline HCl (VISINE OP) Place 1 drop into both eyes daily as needed (for dry or irritated eyes).     [provider]  triamterene -hydrochlorothiazide  (MAXZIDE ) 75-50 MG per tablet Take 0.5 tablets by mouth daily.  01/06/13   [provider]  WELCHOL  625 MG tablet Take 1,875 mg by mouth at bedtime.  05/19/13   [provider]      Allergies    Metformin, Mounjaro [tirzepatide], and Penicillins    Review of Systems   Review of Systems A 10 point review of systems was performed and is negative unless otherwise reported in HPI.  Physical  Exam Updated Vital Signs BP 116/67   Pulse 65   Temp (!) 96.3 F (35.7 C) (Oral)   Resp 19   SpO2 95%  Physical Exam General: Normal appearing male, lying in bed.  HEENT:  Sclera anicteric, MMM, trachea midline.  Cardiology: RRR Resp: Normal respiratory rate and effort.  Abd: Soft, non-tender, non-distended. No rebound tenderness or guarding.  GU: Deferred. MSK: Erythematous R 3rd toe with ulceration distally, please see image below. No peripheral edema or signs of trauma. Extremities without deformity or TTP. Palpable distal DP/PT pulses. Skin: warm, dry.  Neuro: A&Ox4, CNs II-XII grossly intact. MAEs. Sensation grossly intact.  Psych: Normal mood and affect.        ED Results / Procedures / Treatments   Labs (all labs ordered are listed, but only abnormal results are displayed) Labs Reviewed  CBC WITH DIFFERENTIAL/PLATELET - Abnormal; Notable for the following components:      Result Value   Hemoglobin 17.2 (*)    Platelets 148 (*)    All other components within normal limits  COMPREHENSIVE METABOLIC PANEL WITH GFR - Abnormal; Notable for the following components:   Glucose, Bld 244 (*)    All other components within normal limits  CULTURE, BLOOD (ROUTINE X 2)  CULTURE, BLOOD (ROUTINE X 2)  I-STAT CG4 LACTIC ACID, ED    EKG None  Radiology MR TOES RIGHT WO CONTRAST Result Date: 10/27/2023 CLINICAL DATA:  eval for OM R 3rd toe History of diabetes and neuropathic joint. EXAM: MRI OF THE RIGHT TOES WITHOUT CONTRAST TECHNIQUE: Multiplanar, multisequence MR imaging of the right toes was performed. No intravenous contrast was administered. COMPARISON:  Radiographs 10/27/2023 and 12/18/2022.  MRI 09/18/2021. FINDINGS: Bones/Joint/Cartilage Mild clawtoe deformities of the 2nd through 5th digits. The distal 3rd phalanx has an abnormal appearance with decreased T1 and increased T2 marrow signal. The cortex also appears ill-defined, findings suspicious for osteomyelitis. The  middle and proximal phalanges appear normal. The additional toes and distal metatarsals appear normal. There are no significant joint effusions. Degenerative changes at the 1st metatarsophalangeal joint are stable. Known Charcot changes within the midfoot are not imaged on this examination. Ligaments The collateral ligaments of the metatarsophalangeal joints are intact. Muscles and Tendons Chronic fatty atrophy throughout the forefoot musculature. The forefoot tendons are intact without significant tenosynovitis. Soft tissues Possible soft tissue ulceration along the 3rd nail bed with underlying soft tissue inflammatory changes, but no organized fluid collection. Mild dorsal subcutaneous edema, similar to previous MRI. IMPRESSION: 1. Findings suspicious for osteomyelitis of the distal 3rd phalanx. 2. Possible soft tissue ulceration along the 3rd nail bed with underlying soft tissue inflammatory changes, but no organized fluid collection. 3. Stable degenerative changes at the 1st metatarsophalangeal joint. Known Charcot changes within the midfoot are  not imaged on this examination. Electronically Signed   By: Elsie Perone M.D.   On: 10/27/2023 17:38   VAS US  ABI WITH/WO TBI Result Date: 10/27/2023  LOWER EXTREMITY DOPPLER STUDY Patient Name:  [name]  Date of Exam:   10/27/2023 Medical Rec #: [id number]       Accession #:    [id number] Date of Birth: [birth year]       Patient Gender: M Patient Age:   9 years Exam Location:  [facility] Procedure:      VAS US  ABI WITH/WO TBI Referring Phys: MARSA HONOUR --------------------------------------------------------------------------------  Indications: Ulceration. High Risk Factors: Hypertension, hyperlipidemia, Diabetes, no history of                    smoking, coronary artery disease. 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  Comparison Study: Previous exam 12/11/2022 Performing Technologist: Leigh Rom RVT/RDMS  Examination  Guidelines: A complete evaluation includes at minimum, Doppler waveform signals and systolic blood pressure reading at the level of bilateral brachial, anterior tibial, and posterior tibial arteries, when vessel segments are accessible. Bilateral testing is considered an integral part of a complete examination. Photoelectric Plethysmograph (PPG) waveforms and toe systolic pressure readings are included as required and additional duplex testing as needed. Limited examinations for reoccurring indications may be performed as noted.  ABI Findings: +---------+------------------+-----+---------+--------+ Right    Rt Pressure (mmHg)IndexWaveform Comment  +---------+------------------+-----+---------+--------+ Brachial 136                    triphasic         +---------+------------------+-----+---------+--------+ PTA      143               1.05 triphasic         +---------+------------------+-----+---------+--------+ DP       131               0.96 triphasic         +---------+------------------+-----+---------+--------+ Great Toe88                0.65 Normal            +---------+------------------+-----+---------+--------+ +---------+------------------+-----+---------+-------+ Left     Lt Pressure (mmHg)IndexWaveform Comment +---------+------------------+-----+---------+-------+ Brachial 122                    triphasic        +---------+------------------+-----+---------+-------+ PTA      140               1.03 triphasic        +---------+------------------+-----+---------+-------+ DP       136               1.00 triphasic        +---------+------------------+-----+---------+-------+ Great Toe104               0.76 Normal           +---------+------------------+-----+---------+-------+ +-------+-----------+-----------+------------+------------+ ABI/TBIToday's ABIToday's TBIPrevious ABIPrevious TBI  +-------+-----------+-----------+------------+------------+ Right  1.05       0.65       1.23        1.30         +-------+-----------+-----------+------------+------------+ Left   1.03       0.76       1.17        1.09         +-------+-----------+-----------+------------+------------+  Summary: Right: Resting right ankle-brachial index is within normal range. The right toe-brachial index is  abnormal. Left: Resting left ankle-brachial index is within normal range. The left toe-brachial index is normal. *See table(s) above for measurements and observations.     Preliminary     Procedures Procedures    Medications Ordered in ED Medications  vancomycin  (VANCOREADY) IVPB 2000 mg/400 mL (has no administration in time range)  cefTRIAXone  (ROCEPHIN ) 2 g in sodium chloride  0.9 % 100 mL IVPB (0 g Intravenous Stopped 10/27/23 2148)  metroNIDAZOLE  (FLAGYL ) IVPB 500 mg (500 mg Intravenous New Bag/Given 10/27/23 2143)  vancomycin  (VANCOCIN ) IVPB 1000 mg/200 mL premix (has no administration in time range)    ED Course/ Medical Decision Making/ A&P                          Medical Decision Making Amount and/or Complexity of Data Reviewed Radiology:  Decision-making details documented in ED Course.  Risk Prescription drug management. Decision regarding hospitalization.    This patient presents to the ED for concern of R 3rd toe infection, this involves an extensive number of treatment options, and is a complaint that carries with it a high risk of complications and morbidity.  I considered the following differential and admission for this acute, potentially life threatening condition.   MDM:    Pt w/ XR at clinic shows c/f osteomyelitis. MRI ordered from triage here confirms osteomyelitis w/in distal 3rd phalanx of R foot. Patient is afebrile, HDS, very well-appearing, no c/f sepsis. Podiatrist says he also had ABIs done which were okay.  Clinical Course as of 10/27/23 2204  Tue Oct 27, 2023  1820 MR TOES RIGHT WO CONTRAST 1. Findings suspicious for osteomyelitis of the distal 3rd phalanx. 2. Possible soft tissue ulceration along the 3rd nail bed with underlying soft tissue inflammatory changes, but no organized fluid collection. 3. Stable degenerative changes at the 1st metatarsophalangeal joint. Known Charcot changes within the midfoot are not imaged on this examination.   [HN]  1823 Consulted to podiatry, giving vanc/flagyl /ceftriaxone  for osteomyelitis [HN]  1844 D/w Dr. Malvin w/ podiatry who will plan to take patient to OR tomorrow afternoon. [HN]    Clinical Course User Index [HN] Franklyn Sid SAILOR, MD    Labs: I Ordered, and personally interpreted labs.  The pertinent results include:  those listed above  Imaging Studies ordered: XR/MRI ordered from triage I independently visualized and interpreted imaging. I agree with the radiologist interpretation  Additional history obtained from chart review.   Social Determinants of Health: .Lives independently  Disposition:  Admitted to hospitalist w/ podiatry following  Co morbidities that complicate the patient evaluation . Past Medical History:  Diagnosis Date  . Arthritis    ankles, knees (01/29/2018)  . CAD in native artery    cath report from 02/2017 indicates patient had a history of RCA stent >10 years prior to that. At time of that cath he was found to have EF 70%, 20-30% ostial LAD, 30-40% prox LAD, 30-40% Cx, 80-90% Cx beyond OM1, totally occluded RCA with left-to-right collaterals.  . Cellulitis 03/03/2022  . Diabetes mellitus type 2 in obese   . GERD (gastroesophageal reflux disease)   . Hyperlipidemia   . Hypertension   . Morbid obesity (HCC)   . Onychomycosis 03/03/2022  . OSA on CPAP      Medicines Meds ordered this encounter  Medications  . vancomycin  (VANCOREADY) IVPB 2000 mg/400 mL    Indication::   Osteomyelitis  . cefTRIAXone  (ROCEPHIN ) 2 g in sodium chloride  0.9 %  100  mL IVPB    Antibiotic Indication::   Osteomyelitis  . metroNIDAZOLE  (FLAGYL ) IVPB 500 mg    Antibiotic Indication::   Other Indication (list below)    Other Indication::   osteomyelitis  . vancomycin  (VANCOCIN ) IVPB 1000 mg/200 mL premix    Indication::   Osteomyelitis    I have reviewed the patients home medicines and have made adjustments as needed  Problem List / ED Course: Problem List Items Addressed This Visit       Musculoskeletal and Integument   * (Principal) Osteomyelitis (HCC) - Primary   Relevant Medications   vancomycin  (VANCOREADY) IVPB 2000 mg/400 mL   cefTRIAXone  (ROCEPHIN ) 2 g in sodium chloride  0.9 % 100 mL IVPB   metroNIDAZOLE  (FLAGYL ) IVPB 500 mg   vancomycin  (VANCOCIN ) IVPB 1000 mg/200 mL premix (Start on 10/28/2023 10:00 AM)         Franklyn Sid SAILOR, MD 10/27/23 2204

## 2023-10-27 NOTE — ED Triage Notes (Signed)
 Pt arrives via POV. PT states his podiatrist removed a toenail from his right middle toe two weeks ago. Pt states since then, the toe has become infected. His podiatrist was concerned he may now have osteomyelitis. He is a diabetic. Denies any other symptoms. Pt AxOx4.

## 2023-10-27 NOTE — H&P (Signed)
 History and Physical    [name] FMW:[id number] DOB: May 20, 1955 DOA: 10/27/2023  PCP: Charlott Dorn LABOR, MD  Patient coming from: home  I have personally briefly reviewed patient's old medical records in [facility]  Chief Complaint:  right 3rd toe infection progressive  HPI: [name] is a 68 y.o. male with medical history significant of  OSA on CPAP, HTN,HLD,GERD,DMII, CAD s/p RCA stent who presents  to ED with interim history of right 3rd toe nail removal 2 weeks ago.  Per patient since then he had noted increase redness. He had followed up with podiatry and was diagnosed with cellulitis and placed on antibiotics.  Patient however presents to ED with continued signs of infection with concern for possible osteomyelitis.  Patient notes no fever no chills, no n/v/d/ dysuria no shortness of breath.   ED Course:  Vitals: Afeb, bp 140/80, Hr 63, rr 18, sat 95%  Wbc 7, hgb 17.2, plt 148,  Na 139, K 3.9, glu 244,  cr 1.18 (0.85) MRI right foot MPRESSION: 1. Findings suspicious for osteomyelitis of the distal 3rd phalanx. 2. Possible soft tissue ulceration along the 3rd nail bed with underlying soft tissue inflammatory changes, but no organized fluid collection. 3. Stable degenerative changes at the 1st metatarsophalangeal joint. Known Charcot changes within the midfoot are not imaged on this examination.  Review of Systems: As per HPI otherwise 10 point review of systems negative.   Past Medical History:  Diagnosis Date   Arthritis    ankles, knees (01/29/2018)   CAD in native artery    cath report from 02/2017 indicates patient had a history of RCA stent >10 years prior to that. At time of that cath he was found to have EF 70%, 20-30% ostial LAD, 30-40% prox LAD, 30-40% Cx, 80-90% Cx beyond OM1, totally occluded RCA with left-to-right collaterals.   Cellulitis 03/03/2022   Diabetes mellitus type 2 in obese    GERD (gastroesophageal reflux disease)     Hyperlipidemia    Hypertension    Morbid obesity (HCC)    Onychomycosis 03/03/2022   OSA on CPAP     Past Surgical History:  Procedure Laterality Date   AMPUTATION Left 09/08/2022   Procedure: AMPUTATION LEFT 2nd TOE;  Surgeon: Silva Juliene SAUNDERS, DPM;  Location: [facility] OR;  Service: Podiatry;  Laterality: Left;   CARDIAC CATHETERIZATION  2008   CORONARY ANGIOPLASTY WITH STENT PLACEMENT  1997   JOINT REPLACEMENT     KNEE ARTHROSCOPY Bilateral    meniscus repairs   TONSILLECTOMY     TOTAL KNEE ARTHROPLASTY Left 01/29/2018   TOTAL KNEE ARTHROPLASTY Left 01/29/2018   Procedure: LEFT TOTAL KNEE ARTHROPLASTY;  Surgeon: Jerri Kay HERO, MD;  Location: [facility] OR;  Service: Orthopedics;  Laterality: Left;   UVULOPALATOPHARYNGOPLASTY  ~ 1994     reports that he has never smoked. He has never been exposed to tobacco smoke. He has never used smokeless tobacco. He reports current alcohol use. He reports that he does not use drugs.  Allergies  Allergen Reactions   Metformin Other (See Comments)   Mounjaro [Tirzepatide]    Penicillins Rash    Has patient had a PCN reaction causing immediate rash, facial/tongue/throat swelling, SOB or lightheadedness with hypotension: Yes Has patient had a PCN reaction causing severe rash involving mucus membranes or skin necrosis: Unk Has patient had a PCN reaction that required hospitalization: Unk Has patient had a PCN reaction occurring within the last 10 years: No If all  of the above answers are NO, then may proceed with Cephalosporin use.     Family History  Problem Relation Age of Onset   Heart attack Mother    Heart attack Father     Prior to Admission medications   Medication Sig Start Date End Date Taking? Authorizing Provider  acetaminophen  (TYLENOL ) 650 MG CR tablet Take 1,300 mg by mouth at bedtime.    [provider]  aspirin  EC 325 MG tablet Take 325 mg by mouth at bedtime.    [provider]  atorvastatin  (LIPITOR ) 80 MG  tablet Take 80 mg by mouth at bedtime.  02/09/13   [provider]  celecoxib  (CELEBREX ) 200 MG capsule TAKE 1 CAPSULE BY MOUTH EVERY DAY 10/19/23   Gershon Donnice SAUNDERS, DPM  Cholecalciferol (VITAMIN D3) 2000 units TABS Take 2,000 Units by mouth daily.    [provider]  doxycycline  (VIBRA -TABS) 100 MG tablet Take 1 tablet (100 mg total) by mouth 2 (two) times daily. 07/20/23   Gershon Donnice SAUNDERS, DPM  empagliflozin  (JARDIANCE ) 25 MG TABS tablet Take 1 tablet (25 mg total) by mouth daily. 12/08/22   Croitoru, Mihai, MD  ezetimibe  (ZETIA ) 10 MG tablet TAKE 1 TABLET BY MOUTH EVERY DAY 09/25/23   Croitoru, Jerel, MD  isosorbide  mononitrate (IMDUR ) 120 MG 24 hr tablet TAKE 1 TABLET BY MOUTH EVERY DAY 12/08/22   Croitoru, Mihai, MD  metoprolol  succinate (TOPROL -XL) 100 MG 24 hr tablet Take 1 tablet (100 mg total) by mouth daily. Take with or immediately following a meal. 12/08/22   Croitoru, Mihai, MD  Multiple Vitamin (MULTIVITAMIN) tablet Take 1 tablet by mouth daily.    [provider]  nitroGLYCERIN  (NITROSTAT ) 0.4 MG SL tablet Place 1 tablet (0.4 mg total) under the tongue every 5 (five) minutes x 3 doses as needed for chest pain. 11/24/22   Croitoru, Mihai, MD  omeprazole (PRILOSEC) 20 MG capsule Take 20 mg by mouth daily.  02/03/13   [provider]  OZEMPIC, 0.25 OR 0.5 MG/DOSE, 2 MG/3ML SOPN Inject 0.25 mg into the skin once a week. 11/11/22   [provider]  pregabalin  (LYRICA ) 150 MG capsule Take 1 capsule (150 mg total) by mouth 2 (two) times daily. 08/10/23   Gershon Donnice SAUNDERS, DPM  senna-docusate (SENOKOT S) 8.6-50 MG tablet Take 1 tablet by mouth at bedtime as needed. Patient taking differently: Take 2 tablets by mouth at bedtime. 01/29/18   Jerri Kay HERO, MD  Tetrahydrozoline HCl (VISINE OP) Place 1 drop into both eyes daily as needed (for dry or irritated eyes).     [provider]  triamterene -hydrochlorothiazide  (MAXZIDE ) 75-50 MG per tablet  Take 0.5 tablets by mouth daily.  01/06/13   [provider]  WELCHOL  625 MG tablet Take 1,875 mg by mouth at bedtime.  05/19/13   [provider]    Physical Exam: Vitals:   10/27/23 1433 10/27/23 1710 10/27/23 1730 10/27/23 1800  BP: 125/68 136/81 114/65 112/66  Pulse: 68 (!) 57 (!) 59 61  Resp: 18 11 18 17   Temp: 98.3 F (36.8 C)   97.7 F (36.5 C)  TempSrc:    Oral  SpO2: 94% 97% 99% 97%    Constitutional: NAD, calm, comfortable Vitals:   10/27/23 1433 10/27/23 1710 10/27/23 1730 10/27/23 1800  BP: 125/68 136/81 114/65 112/66  Pulse: 68 (!) 57 (!) 59 61  Resp: 18 11 18 17   Temp: 98.3 F (36.8 C)   97.7 F (36.5 C)  TempSrc:    Oral  SpO2: 94% 97% 99% 97%   Eyes: PERRL, lids and conjunctivae normal ENMT: Mucous membranes are moist. Posterior pharynx clear of any exudate or lesions.Normal dentition.  Neck: normal, supple, no masses, no thyromegaly Respiratory: clear to auscultation bilaterally, no wheezing, no crackles. Normal respiratory effort. No accessory muscle use.  Cardiovascular: Regular rate and rhythm, no murmurs / rubs / gallops. No extremity edema. 2+ pedal pulses. No carotid bruits.  Abdomen: no tenderness, no masses palpated. No hepatosplenomegaly. Bowel sounds positive.  Musculoskeletal: no clubbing / cyanosis. No joint deformity upper and lower extremities. Good ROM, no contractures. Normal muscle tone.  Skin: no rashes, lesions, ulcers. No induration Neurologic: CN 2-12 grossly intact. Sensation intact, DTR normal. Strength 5/5 in all 4.  Psychiatric: Normal judgment and insight. Alert and oriented x 3. Normal mood.    Labs on Admission: I have personally reviewed following labs and imaging studies  CBC: Recent Labs  Lab 10/27/23 1448  WBC 7.0  NEUTROABS 4.8  HGB 17.2*  HCT 50.7  MCV 88.6  PLT 148*   Basic Metabolic Panel: Recent Labs  Lab 10/27/23 1448  NA 139  K 3.9  CL 103  CO2 23  GLUCOSE 244*  BUN 17  CREATININE  1.18  CALCIUM  9.3   GFR: Estimated Creatinine Clearance: 72.7 mL/min (by C-G formula based on SCr of 1.18 mg/dL). Liver Function Tests: Recent Labs  Lab 10/27/23 1448  [name] 24  ALT 26  ALKPHOS 59  BILITOT 1.0  PROT 6.7  ALBUMIN 3.6   No results for input(s): LIPASE, AMYLASE in the last 168 hours. No results for input(s): AMMONIA in the last 168 hours. Coagulation Profile: No results for input(s): INR, PROTIME in the last 168 hours. Cardiac Enzymes: No results for input(s): CKTOTAL, CKMB, CKMBINDEX, TROPONINI in the last 168 hours. BNP (last 3 results) No results for input(s): PROBNP in the last 8760 hours. HbA1C: No results for input(s): HGBA1C in the last 72 hours. CBG: No results for input(s): GLUCAP in the last 168 hours. Lipid Profile: No results for input(s): CHOL, HDL, LDLCALC, TRIG, CHOLHDL, LDLDIRECT in the last 72 hours. Thyroid Function Tests: No results for input(s): TSH, T4TOTAL, FREET4, T3FREE, THYROIDAB in the last 72 hours. Anemia Panel: No results for input(s): VITAMINB12, FOLATE, FERRITIN, TIBC, IRON, RETICCTPCT in the last 72 hours. Urine analysis:    Component Value Date/Time   COLORURINE AMBER (A) 09/10/2022 1800   APPEARANCEUR CLEAR 09/10/2022 1800   LABSPEC 1.016 09/10/2022 1800   PHURINE 6.0 09/10/2022 1800   GLUCOSEU >=500 (A) 09/10/2022 1800   HGBUR SMALL (A) 09/10/2022 1800   BILIRUBINUR SMALL (A) 09/10/2022 1800   KETONESUR NEGATIVE 09/10/2022 1800   PROTEINUR 100 (A) 09/10/2022 1800   NITRITE NEGATIVE 09/10/2022 1800   LEUKOCYTESUR NEGATIVE 09/10/2022 1800    Radiological Exams on Admission: MR TOES RIGHT WO CONTRAST Result Date: 10/27/2023 CLINICAL DATA:  eval for OM R 3rd toe History of diabetes and neuropathic joint. EXAM: MRI OF THE RIGHT TOES WITHOUT CONTRAST TECHNIQUE: Multiplanar, multisequence MR imaging of the right toes was performed. No intravenous contrast was  administered. COMPARISON:  Radiographs 10/27/2023 and 12/18/2022.  MRI 09/18/2021. FINDINGS: Bones/Joint/Cartilage Mild clawtoe deformities of the 2nd through 5th digits. The distal 3rd phalanx has an abnormal appearance with decreased T1 and increased T2 marrow signal. The cortex also appears ill-defined, findings suspicious for osteomyelitis. The middle and proximal phalanges appear normal. The additional toes and distal metatarsals appear normal. There are  no significant joint effusions. Degenerative changes at the 1st metatarsophalangeal joint are stable. Known Charcot changes within the midfoot are not imaged on this examination. Ligaments The collateral ligaments of the metatarsophalangeal joints are intact. Muscles and Tendons Chronic fatty atrophy throughout the forefoot musculature. The forefoot tendons are intact without significant tenosynovitis. Soft tissues Possible soft tissue ulceration along the 3rd nail bed with underlying soft tissue inflammatory changes, but no organized fluid collection. Mild dorsal subcutaneous edema, similar to previous MRI. IMPRESSION: 1. Findings suspicious for osteomyelitis of the distal 3rd phalanx. 2. Possible soft tissue ulceration along the 3rd nail bed with underlying soft tissue inflammatory changes, but no organized fluid collection. 3. Stable degenerative changes at the 1st metatarsophalangeal joint. Known Charcot changes within the midfoot are not imaged on this examination. Electronically Signed   By: Elsie Perone M.D.   On: 10/27/2023 17:38   VAS US  ABI WITH/WO TBI Result Date: 10/27/2023  LOWER EXTREMITY DOPPLER STUDY Patient Name:  [name]  Date of Exam:   10/27/2023 Medical Rec #: [id number]       Accession #:    [id number] Date of Birth: 01/09/1956       Patient Gender: M Patient Age:   89 years Exam Location:  [facility] Procedure:      VAS US  ABI WITH/WO TBI Referring Phys: MARSA HONOUR  --------------------------------------------------------------------------------  Indications: Ulceration. High Risk Factors: Hypertension, hyperlipidemia, Diabetes, no history of                    smoking, coronary artery disease. 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  Comparison Study: Previous exam 12/11/2022 Performing Technologist: Leigh Rom RVT/RDMS  Examination Guidelines: A complete evaluation includes at minimum, Doppler waveform signals and systolic blood pressure reading at the level of bilateral brachial, anterior tibial, and posterior tibial arteries, when vessel segments are accessible. Bilateral testing is considered an integral part of a complete examination. Photoelectric Plethysmograph (PPG) waveforms and toe systolic pressure readings are included as required and additional duplex testing as needed. Limited examinations for reoccurring indications may be performed as noted.  ABI Findings: +---------+------------------+-----+---------+--------+ Right    Rt Pressure (mmHg)IndexWaveform Comment  +---------+------------------+-----+---------+--------+ Brachial 136                    triphasic         +---------+------------------+-----+---------+--------+ PTA      143               1.05 triphasic         +---------+------------------+-----+---------+--------+ DP       131               0.96 triphasic         +---------+------------------+-----+---------+--------+ Great Toe88                0.65 Normal            +---------+------------------+-----+---------+--------+ +---------+------------------+-----+---------+-------+ Left     Lt Pressure (mmHg)IndexWaveform Comment +---------+------------------+-----+---------+-------+ Brachial 122                    triphasic        +---------+------------------+-----+---------+-------+ PTA      140               1.03 triphasic        +---------+------------------+-----+---------+-------+ DP        136  1.00 triphasic        +---------+------------------+-----+---------+-------+ Great Toe104               0.76 Normal           +---------+------------------+-----+---------+-------+ +-------+-----------+-----------+------------+------------+ ABI/TBIToday's ABIToday's TBIPrevious ABIPrevious TBI +-------+-----------+-----------+------------+------------+ Right  1.05       0.65       1.23        1.30         +-------+-----------+-----------+------------+------------+ Left   1.03       0.76       1.17        1.09         +-------+-----------+-----------+------------+------------+  Summary: Right: Resting right ankle-brachial index is within normal range. The right toe-brachial index is abnormal. Left: Resting left ankle-brachial index is within normal range. The left toe-brachial index is normal. *See table(s) above for measurements and observations.     Preliminary     EKG: Independently reviewed. N/a  Assessment/Plan   Osteomyelitis of Right 3rd toe -admit to med  tele -place on broad spectrum abx -npo midnight for surgical intervention in am  -f/u with further podiatry recs  - supportive pain medication     CAD s/p RCA stent -no active issues  -resume cardiac regimen   OSA  -on CPAP  HTN -stable resume home regimen   HLD -continue statin , zetia  , Welchol   GERD -ppi   DMII -iss/fs  - A1C   DVT prophylaxis: heparin  Code Status: full/ as discussed per patient wishes in event of cardiac arrest  Family Communication: none at bedside Disposition Plan: patient  expected to be admitted greater than 2 midnights  Consults called: .Podiatry Dr Viviann Admission status: med tele   Camila DELENA Ned MD [facility] Pager [phone]  If 7PM-7AM, please contact night-coverage [email or web address] Password [email or web address]  10/27/2023, 8:10 PM

## 2023-10-27 NOTE — ED Provider Triage Note (Signed)
 Emergency Medicine Provider Triage Evaluation Note  [name] , a 68 y.o. male  was evaluated in triage.  Sent in by podiatrist due to concern of right foot osteomyelitis.  Underlying history of diabetes, had toenail removed approximately two weeks ago.  Review of Systems  Positive: wound Negative: fever  Physical Exam  BP 125/68 (BP Location: Right Arm)   Pulse 68   Temp 98.3 F (36.8 C)   Resp 18   SpO2 94%  Gen:   Awake, no distress   Resp:  Normal effort  MSK:   Moves extremities without difficulty  Other:    Medical Decision Making  Medically screening exam initiated at 2:40 PM.  Appropriate orders placed.  [name] was informed that the remainder of the evaluation will be completed by another provider, this initial triage assessment does not replace that evaluation, and the importance of remaining in the ED until their evaluation is complete.  Concern for osteo in a diabetic foot.    Levi Morriss, PA-C 10/27/23 1440

## 2023-10-27 NOTE — ED Notes (Signed)
 CCMD contacted to place patient on cardiac monitoring.

## 2023-10-28 ENCOUNTER — Encounter (HOSPITAL_COMMUNITY)
Admission: EM | Disposition: A | Discharge status: Home / Self Care | Payer: Self-pay | Source: Home / Self Care | Attending: Internal Medicine

## 2023-10-28 ENCOUNTER — Other Ambulatory Visit: Payer: Self-pay

## 2023-10-28 ENCOUNTER — Inpatient Hospital Stay (HOSPITAL_COMMUNITY)

## 2023-10-28 ENCOUNTER — Encounter (HOSPITAL_COMMUNITY): Payer: Self-pay | Admitting: Internal Medicine

## 2023-10-28 DIAGNOSIS — E1169 Type 2 diabetes mellitus with other specified complication: Secondary | ICD-10-CM

## 2023-10-28 DIAGNOSIS — I10 Essential (primary) hypertension: Secondary | ICD-10-CM | POA: Diagnosis not present

## 2023-10-28 DIAGNOSIS — M86171 Other acute osteomyelitis, right ankle and foot: Secondary | ICD-10-CM | POA: Diagnosis not present

## 2023-10-28 DIAGNOSIS — M869 Osteomyelitis, unspecified: Secondary | ICD-10-CM

## 2023-10-28 DIAGNOSIS — I2510 Atherosclerotic heart disease of native coronary artery without angina pectoris: Secondary | ICD-10-CM | POA: Diagnosis not present

## 2023-10-28 HISTORY — PX: AMPUTATION TOE: SHX6595

## 2023-10-28 LAB — COMPREHENSIVE METABOLIC PANEL WITH GFR
ALT: 23 U/L (ref 0–44)
AST: 21 U/L (ref 15–41)
Albumin: 3.4 g/dL — ABNORMAL LOW (ref 3.5–5.0)
Alkaline Phosphatase: 53 U/L (ref 38–126)
Anion gap: 12 (ref 5–15)
BUN: 15 mg/dL (ref 8–23)
CO2: 24 mmol/L (ref 22–32)
Calcium: 8.8 mg/dL — ABNORMAL LOW (ref 8.9–10.3)
Chloride: 100 mmol/L (ref 98–111)
Creatinine, Ser: 0.85 mg/dL (ref 0.61–1.24)
GFR, Estimated: >60 mL/min (ref >60)
Glucose, Bld: 130 mg/dL — ABNORMAL HIGH (ref 70–99)
Potassium: 3.4 mmol/L — ABNORMAL LOW (ref 3.5–5.1)
Sodium: 136 mmol/L (ref 135–145)
Total Bilirubin: 0.9 mg/dL (ref 0.0–1.2)
Total Protein: 6.2 g/dL — ABNORMAL LOW (ref 6.5–8.1)

## 2023-10-28 LAB — CBG MONITORING, ED
Glucose-Capillary: 113 mg/dL — ABNORMAL HIGH (ref 70–99)
Glucose-Capillary: 190 mg/dL — ABNORMAL HIGH (ref 70–99)

## 2023-10-28 LAB — CBC
HCT: 49.2 % (ref 39.0–52.0)
Hemoglobin: 16.4 g/dL (ref 13.0–17.0)
MCH: 29.7 pg (ref 26.0–34.0)
MCHC: 33.3 g/dL (ref 30.0–36.0)
MCV: 89 fL (ref 80.0–100.0)
Platelets: 126 K/uL — ABNORMAL LOW (ref 150–400)
RBC: 5.53 MIL/uL (ref 4.22–5.81)
RDW: 14.7 % (ref 11.5–15.5)
WBC: 6.1 K/uL (ref 4.0–10.5)
nRBC: 0 % (ref 0.0–0.2)

## 2023-10-28 LAB — GLUCOSE, CAPILLARY
Glucose-Capillary: 108 mg/dL — ABNORMAL HIGH (ref 70–99)
Glucose-Capillary: 118 mg/dL — ABNORMAL HIGH (ref 70–99)
Glucose-Capillary: 124 mg/dL — ABNORMAL HIGH (ref 70–99)
Glucose-Capillary: 129 mg/dL — ABNORMAL HIGH (ref 70–99)
Glucose-Capillary: 181 mg/dL — ABNORMAL HIGH (ref 70–99)

## 2023-10-28 LAB — HIV ANTIBODY (ROUTINE TESTING W REFLEX): HIV Screen 4th Generation wRfx: NONREACTIVE

## 2023-10-28 SURGERY — AMPUTATION, TOE
Anesthesia: Monitor Anesthesia Care | Site: Toe | Laterality: Right

## 2023-10-28 MED ORDER — PROPOFOL 500 MG/50ML IV EMUL
INTRAVENOUS | Status: DC | PRN
Start: 2023-10-28 — End: 2023-10-28
  Administered 2023-10-28: 60 ug/kg/min via INTRAVENOUS

## 2023-10-28 MED ORDER — LIDOCAINE 2% (20 MG/ML) 5 ML SYRINGE
INTRAMUSCULAR | Status: DC | PRN
  Administered 2023-10-28: 60 mg via INTRAVENOUS

## 2023-10-28 MED ORDER — CHLORHEXIDINE GLUCONATE 0.12 % MT SOLN
OROMUCOSAL | Status: AC
  Administered 2023-10-28: 15 mL via OROMUCOSAL
  Filled 2023-10-28: qty 15

## 2023-10-28 MED ORDER — OXYCODONE HCL 5 MG PO TABS: 5.0000 mg | ORAL_TABLET | Freq: Once | ORAL | Status: DC | PRN

## 2023-10-28 MED ORDER — OXYCODONE HCL 5 MG/5ML PO SOLN: 5.0000 mg | Freq: Once | ORAL | Status: DC | PRN

## 2023-10-28 MED ORDER — MIDAZOLAM HCL 2 MG/2ML IJ SOLN
INTRAMUSCULAR | Status: AC
  Filled 2023-10-28: qty 2

## 2023-10-28 MED ORDER — LACTATED RINGERS IV SOLN: INTRAVENOUS | Status: DC

## 2023-10-28 MED ORDER — ACETAMINOPHEN 10 MG/ML IV SOLN: 1000.0000 mg | Freq: Once | INTRAVENOUS | Status: DC | PRN

## 2023-10-28 MED ORDER — VANCOMYCIN HCL 500 MG IV SOLR
INTRAVENOUS | Status: AC
  Filled 2023-10-28: qty 10

## 2023-10-28 MED ORDER — FENTANYL CITRATE (PF) 250 MCG/5ML IJ SOLN
INTRAMUSCULAR | Status: AC
  Filled 2023-10-28: qty 5

## 2023-10-28 MED ORDER — SODIUM CHLORIDE 0.9 % IR SOLN
Status: DC | PRN
  Administered 2023-10-28: 1000 mL via INTRAVESICAL

## 2023-10-28 MED ORDER — PROPOFOL 1000 MG/100ML IV EMUL
INTRAVENOUS | Status: AC
  Filled 2023-10-28: qty 100

## 2023-10-28 MED ORDER — INSULIN ASPART 100 UNIT/ML IJ SOLN
INTRAMUSCULAR | Status: AC
  Filled 2023-10-28: qty 1

## 2023-10-28 MED ORDER — DEXMEDETOMIDINE HCL IN NACL 80 MCG/20ML IV SOLN
INTRAVENOUS | Status: DC | PRN
  Administered 2023-10-28: 8 ug via INTRAVENOUS

## 2023-10-28 MED ORDER — LIDOCAINE HCL (PF) 1 % IJ SOLN
INTRAMUSCULAR | Status: AC
  Filled 2023-10-28: qty 5

## 2023-10-28 MED ORDER — LIDOCAINE HCL 1 % IJ SOLN
INTRAMUSCULAR | Status: DC | PRN
  Administered 2023-10-28: 10 mL

## 2023-10-28 MED ORDER — ORAL CARE MOUTH RINSE: 15.0000 mL | Freq: Once | OROMUCOSAL | Status: AC

## 2023-10-28 MED ORDER — FENTANYL CITRATE (PF) 100 MCG/2ML IJ SOLN: 25.0000 ug | INTRAMUSCULAR | Status: DC | PRN

## 2023-10-28 MED ORDER — BUPIVACAINE HCL (PF) 0.5 % IJ SOLN
INTRAMUSCULAR | Status: AC
  Filled 2023-10-28: qty 10

## 2023-10-28 MED ORDER — CHLORHEXIDINE GLUCONATE 0.12 % MT SOLN: 15.0000 mL | Freq: Once | OROMUCOSAL | Status: AC

## 2023-10-28 MED ORDER — INSULIN ASPART 100 UNIT/ML IJ SOLN
0.0000 [IU] | INTRAMUSCULAR | Status: DC | PRN
  Administered 2023-10-28: 4 [IU] via SUBCUTANEOUS

## 2023-10-28 MED ORDER — PROPOFOL 10 MG/ML IV BOLUS
INTRAVENOUS | Status: AC
  Filled 2023-10-28: qty 20

## 2023-10-28 MED ORDER — MIDAZOLAM HCL 2 MG/2ML IJ SOLN
INTRAMUSCULAR | Status: DC | PRN
  Administered 2023-10-28: 1 mg via INTRAVENOUS

## 2023-10-28 MED ORDER — LIDOCAINE HCL (PF) 1 % IJ SOLN
INTRAMUSCULAR | Status: AC
  Filled 2023-10-28: qty 10

## 2023-10-28 SURGICAL SUPPLY — 37 items
BLADE AVERAGE 25X9 (BLADE) IMPLANT
BLADE SURG 10 STRL SS (BLADE) ×1 IMPLANT
BLADE SURG 15 STRL LF DISP TIS (BLADE) ×1 IMPLANT
BNDG COHESIVE 3X5 TAN ST LF (GAUZE/BANDAGES/DRESSINGS) ×1 IMPLANT
BNDG COMPR ESMARK 4X3 LF (GAUZE/BANDAGES/DRESSINGS) ×1 IMPLANT
BNDG ELASTIC 3INX 5YD STR LF (GAUZE/BANDAGES/DRESSINGS) ×1 IMPLANT
BNDG ELASTIC 4INX 5YD STR LF (GAUZE/BANDAGES/DRESSINGS) IMPLANT
BNDG GAUZE DERMACEA FLUFF 4 (GAUZE/BANDAGES/DRESSINGS) IMPLANT
CHLORAPREP W/TINT 26 (MISCELLANEOUS) IMPLANT
DRSG ADAPTIC 3X8 NADH LF (GAUZE/BANDAGES/DRESSINGS) IMPLANT
DRSG XEROFORM 1X8 (GAUZE/BANDAGES/DRESSINGS) IMPLANT
ELECTRODE REM PT RTRN 9FT ADLT (ELECTROSURGICAL) ×1 IMPLANT
GAUZE PAD ABD 8X10 STRL (GAUZE/BANDAGES/DRESSINGS) IMPLANT
GAUZE SPONGE 2X2 STRL 8-PLY (GAUZE/BANDAGES/DRESSINGS) IMPLANT
GAUZE SPONGE 4X4 12PLY STRL (GAUZE/BANDAGES/DRESSINGS) ×1 IMPLANT
GAUZE STRETCH 2X75IN STRL (MISCELLANEOUS) ×1 IMPLANT
GAUZE XEROFORM 1X8 LF (GAUZE/BANDAGES/DRESSINGS) ×1 IMPLANT
GLOVE BIO SURGEON STRL SZ7.5 (GLOVE) ×1 IMPLANT
GLOVE BIOGEL PI IND STRL 7.5 (GLOVE) ×1 IMPLANT
GOWN STRL REUS W/ TWL LRG LVL3 (GOWN DISPOSABLE) ×2 IMPLANT
KIT BASIN OR (CUSTOM PROCEDURE TRAY) ×1 IMPLANT
NDL HYPO 25X1 1.5 SAFETY (NEEDLE) ×1 IMPLANT
NEEDLE HYPO 25X1 1.5 SAFETY (NEEDLE) ×1 IMPLANT
PACK ORTHO EXTREMITY (CUSTOM PROCEDURE TRAY) ×1 IMPLANT
PAD ABD 8X10 STRL (GAUZE/BANDAGES/DRESSINGS) IMPLANT
PADDING CAST ABS COTTON 4X4 ST (CAST SUPPLIES) ×2 IMPLANT
SET HNDPC FAN SPRY TIP SCT (DISPOSABLE) IMPLANT
SPIKE FLUID TRANSFER (MISCELLANEOUS) IMPLANT
STOCKINETTE 4X48 STRL (DRAPES) IMPLANT
SUT ETHILON 3 0 FSLX (SUTURE) IMPLANT
SUT PROLENE 3 0 PS 2 (SUTURE) IMPLANT
SUT PROLENE 4 0 PS 2 18 (SUTURE) IMPLANT
SYR CONTROL 10ML LL (SYRINGE) ×1 IMPLANT
TUBE CONNECTING 12X1/4 (SUCTIONS) IMPLANT
UNDERPAD 30X36 HEAVY ABSORB (UNDERPADS AND DIAPERS) ×1 IMPLANT
WATER STERILE IRR 1000ML POUR (IV SOLUTION) ×1 IMPLANT
YANKAUER SUCT BULB TIP NO VENT (SUCTIONS) IMPLANT

## 2023-10-28 NOTE — Progress Notes (Signed)
 Orthopedic Tech Progress Note Patient Details:  [name] 1956-03-02 [id number]  Ortho Devices Type of Ortho Device: Postop shoe/boot Ortho Device/Splint Location: RLE Ortho Device/Splint Interventions: Ordered, Application, Adjustment   Post Interventions Patient Tolerated: Well Instructions Provided: Adjustment of device  [name] 10/28/2023, 3:05 PM

## 2023-10-28 NOTE — Progress Notes (Signed)
 PROGRESS NOTE  TAREZ BOWNS FMW:[id number] DOB: [birth year] DOA: 10/27/2023 PCP: Charlott Dorn LABOR, MD   LOS: 1 day   Brief Narrative / Interim history: 68 year old male with OSA on CPAP, HTN, HLD, DM 2, CAD status post stenting comes into the hospital with right third toenail removal 2 weeks ago, with increased redness and swelling.  Was seen by podiatry who referred him to the ER to be admitted for IV antibiotics and surgical intervention.  MRI of the right foot on admission showed findings suspicious for osteomyelitis in the distal left third phalanx.  He was placed on antibiotics and admitted to the hospital  Subjective / 24h Interval events: He is doing well this morning, denies any significant pain as he has neuropathy.  Denies any fever or chills  Assesement and Plan: Principal problem Osteomyelitis of the right third toe -has been placed on antibiotics, podiatry consulted and he will be taken to the OR this afternoon.  Will follow operative report  Active problems CAD-with stenting, no chest pain, continue home medications  Essential hypertension-continue home medications  Hyperlipidemia-continue statin  OSA-continue CPAP  Obesity, class II-BMI 37  DM2, poorly controlled, with hyperglycemia -A1c 8.0, patient is surprised about it.  Continue sliding scale for while here  Lab Results  Component Value Date   HGBA1C 8.0 (H) 10/27/2023   CBG (last 3)  Recent Labs    10/27/23 2340 10/28/23 0435 10/28/23 0809  GLUCAP 165* 113* 190*    Scheduled Meds:  atorvastatin   80 mg Oral QHS   colesevelam   1,875 mg Oral QHS   empagliflozin   25 mg Oral Daily   ezetimibe   10 mg Oral Daily   heparin   5,000 Units Subcutaneous Q8H   insulin  aspart  0-6 Units Subcutaneous Q4H   isosorbide  mononitrate  120 mg Oral Daily   metoprolol  succinate  100 mg Oral Daily   pantoprazole   40 mg Oral Daily   pregabalin   150 mg Oral BID   Continuous Infusions:  sodium chloride  75 mL/hr at  10/28/23 9285   cefTRIAXone  (ROCEPHIN )  IV Stopped (10/27/23 2148)   metronidazole  500 mg (10/28/23 1027)   vancomycin      PRN Meds:.acetaminophen  **OR** acetaminophen , albuterol , nitroGLYCERIN , ondansetron  **OR** ondansetron  (ZOFRAN ) IV, oxyCODONE , senna-docusate  Current Outpatient Medications  Medication Instructions   acetaminophen  (TYLENOL ) 1,300 mg, Daily at bedtime   aspirin  EC 325 mg, Daily at bedtime   atorvastatin  (LIPITOR ) 80 mg, Daily at bedtime   celecoxib  (CELEBREX ) 200 MG capsule Oral, Daily   doxycycline  (VIBRA -TABS) 100 mg, Oral, 2 times daily   empagliflozin  (JARDIANCE ) 25 mg, Oral, Daily   ezetimibe  (ZETIA ) 10 mg, Oral, Daily   isosorbide  mononitrate (IMDUR ) 120 MG 24 hr tablet TAKE 1 TABLET BY MOUTH EVERY DAY   metoprolol  succinate (TOPROL -XL) 100 mg, Oral, Daily, Take with or immediately following a meal.   Multiple Vitamin (MULTIVITAMIN) tablet 1 tablet, Daily   nitroGLYCERIN  (NITROSTAT ) 0.4 mg, Sublingual, Every 5 min x3 PRN   omeprazole (PRILOSEC) 20 mg, Daily   Ozempic (0.25 or 0.5 MG/DOSE) 0.25 mg, Weekly   pregabalin  (LYRICA ) 150 mg, Oral, 2 times daily   senna-docusate (SENOKOT S) 8.6-50 MG tablet 1 tablet, Oral, At bedtime PRN   Tetrahydrozoline HCl (VISINE OP) 1 drop, Daily PRN   triamterene -hydrochlorothiazide  (MAXZIDE ) 75-50 MG per tablet 0.5 tablets, Daily   Vitamin D3 2,000 Units, Daily   Welchol  1,875 mg, Daily at bedtime    Diet Orders (From admission, onward)     Start  Ordered   10/28/23 0001  Diet NPO time specified  Diet effective midnight        10/27/23 2206            DVT prophylaxis: heparin  injection 5,000 Units Start: 10/27/23 2215   Lab Results  Component Value Date   PLT 126 (L) 10/28/2023      Code Status: Full Code  Family Communication: no family at bedside   Status is: Inpatient Remains inpatient appropriate because: severity of illness  Level of care: Telemetry Medical  Consultants:  Podiatry    Objective: Vitals:   10/28/23 0717 10/28/23 0738 10/28/23 0906 10/28/23 1024  BP:  (!) 134/93 (!) 146/76 (!) 146/67  Pulse:  (!) 58 82 61  Resp:  14 20   Temp:   98.1 F (36.7 C)   TempSrc:      SpO2:  97% 97%   Weight: 108.9 kg     Height: 5' 7 (1.702 m)       Intake/Output Summary (Last 24 hours) at 10/28/2023 1119 Last data filed at 10/28/2023 0716 Gross per 24 hour  Intake 400 ml  Output 700 ml  Net -300 ml   Wt Readings from Last 3 Encounters:  10/28/23 108.9 kg  10/27/23 108.9 kg  10/21/23 108.9 kg    Examination:  Constitutional: NAD Eyes: no scleral icterus ENMT: Mucous membranes are moist.  Neck: normal, supple Respiratory: clear to auscultation bilaterally, no wheezing, no crackles.  Cardiovascular: Regular rate and rhythm, no murmurs / rubs / gallops. No LE edema.  Abdomen: non distended, no tenderness. Bowel sounds positive.  Musculoskeletal: no clubbing / cyanosis.    Data Reviewed: I have independently reviewed following labs and imaging studies   CBC Recent Labs  Lab 10/27/23 1448 10/27/23 2302 10/28/23 0440  WBC 7.0 7.9 6.1  HGB 17.2* 16.2 16.4  HCT 50.7 48.7 49.2  PLT 148* 123* 126*  MCV 88.6 88.5 89.0  MCH 30.1 29.5 29.7  MCHC 33.9 33.3 33.3  RDW 14.8 15.3 14.7  LYMPHSABS 1.4  --   --   MONOABS 0.5  --   --   EOSABS 0.3  --   --   BASOSABS 0.1  --   --     Recent Labs  Lab 10/27/23 1448 10/27/23 1500 10/27/23 2302 10/28/23 0440  NA 139  --   --  136  K 3.9  --   --  3.4*  CL 103  --   --  100  CO2 23  --   --  24  GLUCOSE 244*  --   --  130*  BUN 17  --   --  15  CREATININE 1.18  --  0.96 0.85  CALCIUM  9.3  --   --  8.8*  [name] 24  --   --  21  ALT 26  --   --  23  ALKPHOS 59  --   --  53  BILITOT 1.0  --   --  0.9  ALBUMIN 3.6  --   --  3.4*  LATICACIDVEN  --  1.9  --   --   HGBA1C  --   --  8.0*  --      ------------------------------------------------------------------------------------------------------------------ No results for input(s): CHOL, HDL, LDLCALC, TRIG, CHOLHDL, LDLDIRECT in the last 72 hours.  Lab Results  Component Value Date   HGBA1C 8.0 (H) 10/27/2023   ------------------------------------------------------------------------------------------------------------------ No results for input(s): TSH, T4TOTAL, T3FREE, THYROIDAB in the last 72 hours.  Invalid input(s): FREET3  Cardiac Enzymes No results for input(s): CKMB, TROPONINI, MYOGLOBIN in the last 168 hours.  Invalid input(s): CK ------------------------------------------------------------------------------------------------------------------ No results found for: BNP  CBG: Recent Labs  Lab 10/27/23 2340 10/28/23 0435 10/28/23 0809  GLUCAP 165* 113* 190*    Recent Results (from the past 240 hours)  Blood culture (routine x 2)     Status: None (Preliminary result)   Collection Time: 10/27/23  2:48 PM   Specimen: BLOOD  Result Value Ref Range Status   Specimen Description BLOOD RIGHT ANTECUBITAL  Final   Special Requests   Final    BOTTLES DRAWN AEROBIC AND ANAEROBIC Blood Culture adequate volume   Culture   Final    NO GROWTH < 24 HOURS Performed at [facility], [address], [place], [place] [address]    Report Status PENDING  Incomplete  Blood culture (routine x 2)     Status: None (Preliminary result)   Collection Time: 10/27/23  8:27 PM   Specimen: BLOOD LEFT FOREARM  Result Value Ref Range Status   Specimen Description BLOOD LEFT FOREARM  Final   Special Requests   Final    BOTTLES DRAWN AEROBIC AND ANAEROBIC Blood Culture results may not be optimal due to an inadequate volume of blood received in culture bottles   Culture   Final    NO GROWTH < 12 HOURS Performed at [facility], [address] [place], [place] [address]    Report Status  PENDING  Incomplete     Radiology Studies: MR TOES RIGHT WO CONTRAST Result Date: 10/27/2023 CLINICAL DATA:  eval for OM R 3rd toe History of diabetes and neuropathic joint. EXAM: MRI OF THE RIGHT TOES WITHOUT CONTRAST TECHNIQUE: Multiplanar, multisequence MR imaging of the right toes was performed. No intravenous contrast was administered. COMPARISON:  Radiographs 10/27/2023 and 12/18/2022.  MRI 09/18/2021. FINDINGS: Bones/Joint/Cartilage Mild clawtoe deformities of the 2nd through 5th digits. The distal 3rd phalanx has an abnormal appearance with decreased T1 and increased T2 marrow signal. The cortex also appears ill-defined, findings suspicious for osteomyelitis. The middle and proximal phalanges appear normal. The additional toes and distal metatarsals appear normal. There are no significant joint effusions. Degenerative changes at the 1st metatarsophalangeal joint are stable. Known Charcot changes within the midfoot are not imaged on this examination. Ligaments The collateral ligaments of the metatarsophalangeal joints are intact. Muscles and Tendons Chronic fatty atrophy throughout the forefoot musculature. The forefoot tendons are intact without significant tenosynovitis. Soft tissues Possible soft tissue ulceration along the 3rd nail bed with underlying soft tissue inflammatory changes, but no organized fluid collection. Mild dorsal subcutaneous edema, similar to previous MRI. IMPRESSION: 1. Findings suspicious for osteomyelitis of the distal 3rd phalanx. 2. Possible soft tissue ulceration along the 3rd nail bed with underlying soft tissue inflammatory changes, but no organized fluid collection. 3. Stable degenerative changes at the 1st metatarsophalangeal joint. Known Charcot changes within the midfoot are not imaged on this examination. Electronically Signed   By: Elsie Perone M.D.   On: 10/27/2023 17:38   VAS US  ABI WITH/WO TBI Result Date: 10/27/2023  LOWER EXTREMITY DOPPLER STUDY Patient  Name:  [name]  Date of Exam:   10/27/2023 Medical Rec #: [id number]       Accession #:    [id number] Date of Birth: 22-Dec-1955       Patient Gender: M Patient Age:   63 years Exam Location:  [facility] Procedure:  VAS US  ABI WITH/WO TBI Referring Phys: MARSA HONOUR --------------------------------------------------------------------------------  Indications: Ulceration. High Risk Factors: Hypertension, hyperlipidemia, Diabetes, no history of                    smoking, coronary artery disease. 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  Comparison Study: Previous exam 12/11/2022 Performing Technologist: Leigh Rom RVT/RDMS  Examination Guidelines: A complete evaluation includes at minimum, Doppler waveform signals and systolic blood pressure reading at the level of bilateral brachial, anterior tibial, and posterior tibial arteries, when vessel segments are accessible. Bilateral testing is considered an integral part of a complete examination. Photoelectric Plethysmograph (PPG) waveforms and toe systolic pressure readings are included as required and additional duplex testing as needed. Limited examinations for reoccurring indications may be performed as noted.  ABI Findings: +---------+------------------+-----+---------+--------+ Right    Rt Pressure (mmHg)IndexWaveform Comment  +---------+------------------+-----+---------+--------+ Brachial 136                    triphasic         +---------+------------------+-----+---------+--------+ PTA      143               1.05 triphasic         +---------+------------------+-----+---------+--------+ DP       131               0.96 triphasic         +---------+------------------+-----+---------+--------+ Great Toe88                0.65 Normal            +---------+------------------+-----+---------+--------+ +---------+------------------+-----+---------+-------+ Left     Lt Pressure (mmHg)IndexWaveform  Comment +---------+------------------+-----+---------+-------+ Brachial 122                    triphasic        +---------+------------------+-----+---------+-------+ PTA      140               1.03 triphasic        +---------+------------------+-----+---------+-------+ DP       136               1.00 triphasic        +---------+------------------+-----+---------+-------+ Great Toe104               0.76 Normal           +---------+------------------+-----+---------+-------+ +-------+-----------+-----------+------------+------------+ ABI/TBIToday's ABIToday's TBIPrevious ABIPrevious TBI +-------+-----------+-----------+------------+------------+ Right  1.05       0.65       1.23        1.30         +-------+-----------+-----------+------------+------------+ Left   1.03       0.76       1.17        1.09         +-------+-----------+-----------+------------+------------+  Summary: Right: Resting right ankle-brachial index is within normal range. The right toe-brachial index is abnormal. Left: Resting left ankle-brachial index is within normal range. The left toe-brachial index is normal. *See table(s) above for measurements and observations.     Preliminary      Nilda Fendt, MD, PhD [facility]  Between 7 am - 7 pm I am available, please contact me via Amion (for emergencies) or Securechat (non urgent messages)  Between 7 pm - 7 am I am not available, please contact night coverage MD/APP via Amion

## 2023-10-28 NOTE — ED Notes (Signed)
 Pt resting after getting morning labs.

## 2023-10-28 NOTE — Consult Note (Signed)
 PODIATRY CONSULTATION  NAME [name] MRN [id number] DOB 12-Oct-1955 DOA 10/27/2023   Reason for consult:  Chief Complaint  Patient presents with   Wound Infection    Attending/Consulting physician: C. Gherghe MD  History of present illness: 68 year old male with OSA on CPAP, HTN, HLD, DM 2, CAD status post stenting comes into the hospital with right third toenail removal 2 weeks ago, with increased redness and swelling. Was seen by podiatry who referred him to the ER to be admitted for IV antibiotics and surgical intervention. MRI of the right foot on admission showed findings suspicious for osteomyelitis in the distal left third phalanx. He was placed on antibiotics and admitted to the hospital  Pt seen in ED, he was sent to [facility] ED from office yesterday after concern for osteomyelitis of the right third toe. Discussed the MRI did confirm the bone infection in the end of the right third toe. Discussed blood flow looks to be acceptable for wound healing.  Recommend partial right great toe amputation and he is agreeable to proceed.   Past Medical History:  Diagnosis Date   Arthritis    ankles, knees (01/29/2018)   CAD in native artery    cath report from 02/2017 indicates patient had a history of RCA stent >10 years prior to that. At time of that cath he was found to have EF 70%, 20-30% ostial LAD, 30-40% prox LAD, 30-40% Cx, 80-90% Cx beyond OM1, totally occluded RCA with left-to-right collaterals.   Cellulitis 03/03/2022   Diabetes mellitus type 2 in obese    GERD (gastroesophageal reflux disease)    Hyperlipidemia    Hypertension    Morbid obesity (HCC)    Onychomycosis 03/03/2022   OSA on CPAP        Latest Ref Rng & Units 10/28/2023    4:40 AM 10/27/2023   11:02 PM 10/27/2023    2:48 PM  CBC  WBC 4.0 - 10.5 K/uL 6.1  7.9  7.0   Hemoglobin 13.0 - 17.0 g/dL 83.5  83.7  82.7   Hematocrit 39.0 - 52.0 % 49.2  48.7  50.7   Platelets 150 - 400 K/uL 126  123  148         Latest Ref Rng & Units 10/28/2023    4:40 AM 10/27/2023   11:02 PM 10/27/2023    2:48 PM  BMP  Glucose 70 - 99 mg/dL 869   755   BUN 8 - 23 mg/dL 15   17   Creatinine 9.38 - 1.24 mg/dL 9.14  9.03  8.81   Sodium 135 - 145 mmol/L 136   139   Potassium 3.5 - 5.1 mmol/L 3.4   3.9   Chloride 98 - 111 mmol/L 100   103   CO2 22 - 32 mmol/L 24   23   Calcium  8.9 - 10.3 mg/dL 8.8   9.3       Physical Exam: Lower Extremity Exam Right PT and DP pulses are palpable and CFT < 3 seconds  Sensation diminished to light touch to right 3rd toe  Ulceration with erythema and edema distal aspect right 3rd toe      ASSESSMENT/PLAN OF CARE 68 y.o. male with PMHx significant for  OSA on CPAP, HTN, HLD, DM 2, CAD  with ulceration of the right foot 3rd toe with underlying osteomyelitis of the distal phalanx.   - NPO since MN last night for OR this afternoon for Right third toe amputation at PIPJ  level. Pt agrees to proceed.  - Continue IV abx broad spectrum pending further culture data - Anticoagulation: ok to continue per primary - Wound care: None required pre op - WB status: WBAT in post op shoe following surgery - Will continue to follow   Thank you for the consult.  Please contact me directly with any questions or concerns.           [name], DPM [facility] / [facility]    [address] [place], [place] [address]                Office [phone]  Fax [phone]

## 2023-10-28 NOTE — Op Note (Signed)
 Full Operative Report  Date of Operation: 1:11 PM, 10/28/2023   Patient: [name] - 68 y.o. male  Surgeon: Malvin Marsa FALCON, DPM   Assistant: None  Diagnosis: osteomyelitis of right third toe  Procedure:  1. Third toe amputation at level of proximal phalanx head, right foot    Anesthesia: Anesthesia type not filed in the log.  No responsible provider has been recorded for the case.  Anesthesiologist: Leopoldo Bruckner, MD CRNA: Jerl Donald LABOR, CRNA   Estimated Blood Loss: 10 mL  Hemostasis: 1) Anatomical dissection, mechanical compression, electrocautery 2) No tourniquet was used  Implants: * No implants in log *  Materials: 3-0 prolene   Injectables: 1) Pre-operatively: 10 cc of 50:50 mixture 1%lidocaine  plain and 0.5% marcaine  plain 2) Post-operatively: None   Specimens: - Pathology: Right 3rd toe  - Microbiology: bone culture distal 3rd toe   Antibiotics: IV antibiotics given per schedule on the floor  Drains: None  Complications: Patient tolerated the procedure well without complication.   Operative findings: As below in detailed report  Indications for Procedure: [name] presents to Malvin Marsa FALCON, DPM with a chief complaint of ulceration with underlying osteomyelitis of the right third toe. The patient has failed conservative treatments of various modalities. At this time the patient has elected to proceed with surgical correction. All alternatives, risks, and complications of the procedures were thoroughly explained to the patient. Patient exhibits appropriate understanding of all discussion points and informed consent was signed and obtained in the chart with no guarantees to surgical outcome given or implied.  Description of Procedure: Patient was brought to the operating room. Patient remained on their hospital bed in the supine position. A surgical timeout was performed and all members of the operating room, the procedure,  and the surgical site were identified. anesthesia occurred as per anesthesia record. Local anesthetic as previously described was then injected about the operative field in a local infiltrative block.  The operative lower extremity as noted above was then prepped and draped in the usual sterile manner. The following procedure then began.  Attention was directed to the 3rd digit on the RIGHT foot. A full-thickness incision encompassing the entire digit was made using a #15 blade. Dissection was carried down to bone. The toe was secured with a towel clamp, further dissected in its entirety, and disarticulated at the PIPJ and passed to the back table as a gross specimen. This was then labled and sent to pathology. The bone was noted to be soft and eroded, and consistent with osteomyelitis. All remaining necrotic and devitalized soft tissue structures were visualized and dissected away using sharp and dull dissection. A bone culture was taken with rongeur. Next a bone cutting forceps was used to resect the head of the proximal phalanx for closure purposes. Care was taken to protect all neurovascular structures throughout the dissection. All bleeders were cauterized as necessary. The area was then flushed with copious amounts of sterile saline. Then using the suture materials previously described, the site was closed in anatomic layers and the skin was well approximated under minimal tension.  The surgical site was then dressed with xerform 4x4 kerlix and ace wrap. The patient tolerated both the procedure and anesthesia well with vital signs stable throughout. The patient was transferred in good condition and all vital signs stable  from the OR to recovery under the discretion of anesthesia.  Condition: Vital signs stable, neurovascular status unchanged from preoperative   Surgical plan:  Expect  clean margin, WBAT in post op shoe. 5 days po abx on discharge. Will be stable for DC home tmrw.   The patient will  be WBAT in a post op shoe to the operative limb until further instructed. The dressing is to remain clean, dry, and intact. Will continue to follow unless noted elsewhere.   Marsa Honour, DPM [facility]

## 2023-10-28 NOTE — Transfer of Care (Signed)
 Immediate Anesthesia Transfer of Care Note  Patient: [name]  Procedure(s) Performed: AMPUTATION, TOE (Right: Toe)  Patient Location: PACU  Anesthesia Type:MAC  Level of Consciousness: awake, oriented, and drowsy  Airway & Oxygen Therapy: Patient Spontanous Breathing  Post-op Assessment: Report given to RN, Post -op Vital signs reviewed and stable, and Patient moving all extremities  Post vital signs: Reviewed and stable  Last Vitals:  Vitals Value Taken Time  BP 137/76 10/28/23 13:54  Temp    Pulse 60 10/28/23 13:56  Resp 16 10/28/23 13:56  SpO2 93 % 10/28/23 13:56  Vitals shown include unfiled device data.  Last Pain:  Vitals:   10/28/23 1231  TempSrc:   PainSc: 0-No pain         Complications: No notable events documented.

## 2023-10-28 NOTE — Anesthesia Preprocedure Evaluation (Signed)
 Anesthesia Evaluation  Patient identified by MRN, date of birth, ID band Patient awake    Reviewed: Allergy & Precautions, NPO status , Patient's Chart, lab work & pertinent test results  History of Anesthesia Complications Negative for: history of anesthetic complications  Airway Mallampati: IV  TM Distance: <3 FB Neck ROM: Full    Dental  (+) Teeth Intact, Dental Advisory Given   Pulmonary neg shortness of breath, sleep apnea and Continuous Positive Airway Pressure Ventilation , neg COPD, neg recent URI   breath sounds clear to auscultation       Cardiovascular hypertension, Pt. on medications and Pt. on home beta blockers (-) angina + CAD and + Cardiac Stents   Rhythm:Regular     Neuro/Psych neg Seizures  Neuromuscular disease    GI/Hepatic Neg liver ROS,GERD  Medicated and Controlled,,  Endo/Other  diabetes  Lab Results      Component                Value               Date                      HGBA1C                   8.0 (H)             10/27/2023             Renal/GU Lab Results      Component                Value               Date                      NA                       136                 10/28/2023                K                        3.4 (L)             10/28/2023                CO2                      24                  10/28/2023                GLUCOSE                  130 (H)             10/28/2023                BUN                      15                  10/28/2023                CREATININE  0.85                10/28/2023                CALCIUM                   8.8 (L)             10/28/2023                EGFR                     80.0                12/23/2022                GFRNONAA                 >60                 10/28/2023                Musculoskeletal  (+) Arthritis ,    Abdominal   Peds  Hematology Lab Results      Component                Value                Date                      WBC                      6.1                 10/28/2023                HGB                      16.4                10/28/2023                HCT                      49.2                10/28/2023                MCV                      89.0                10/28/2023                PLT                      126 (L)             10/28/2023              Anesthesia Other Findings   Reproductive/Obstetrics                              Anesthesia Physical Anesthesia Plan  ASA: 3  Anesthesia Plan: MAC   Post-op Pain Management: Minimal or no pain anticipated   Induction: Intravenous  PONV Risk Score and Plan: 1 and Propofol  infusion and Ondansetron   Airway Management  Planned: Nasal Cannula, Natural Airway and Simple Face Mask  Additional Equipment: None  Intra-op Plan:   Post-operative Plan:   Informed Consent: I have reviewed the patients History and Physical, chart, labs and discussed the procedure including the risks, benefits and alternatives for the proposed anesthesia with the patient or authorized representative who has indicated his/her understanding and acceptance.     Dental advisory given  Plan Discussed with: CRNA  Anesthesia Plan Comments:         Anesthesia Quick Evaluation

## 2023-10-28 NOTE — Plan of Care (Signed)
  Problem: Metabolic: Goal: Ability to maintain appropriate glucose levels will improve Outcome: Progressing   Problem: Education: Goal: Knowledge of General Education information will improve Description: Including pain rating scale, medication(s)/side effects and non-pharmacologic comfort measures Outcome: Progressing   Problem: Pain Managment: Goal: General experience of comfort will improve and/or be controlled Outcome: Progressing

## 2023-10-28 NOTE — Progress Notes (Signed)
 Subjective: Chief Complaint  Patient presents with   Foot Ulcer    Patient is here for Wound check right foot no drainage bottom of foot looks better toe still bleeds a lot   68 year old male presents the office today for follow-up evaluation of a wound of the right foot.  He states that recently he started noticing increased swelling and redness of the right third toe.  He states that today he started feeling jittery not report any fevers or chills.  He does not have any pain to the toe  Objective: AAO x3, NAD DP/PT pulses palpable bilaterally, CRT less than 3 seconds There is edema and erythema present to the right third toe with a wound present to distal aspect.  There is no fluctuation or crepitation.  There is malodor coming from the wound today.  Edema present to the foot.  Preulcerative areas noted submetatarsal and plantar aspect of foot right side. No pain with calf compression, swelling, warmth, erythema  Assessment: Concern for acute osteomyelitis right third toe  Plan: -All treatment options discussed with the patient including all alternatives, risks, complications.  -X-rays obtained reviewed.  There is increased radiolucency noted along the distal phalanx concerning for acute osteomyelitis. -He has been oral antibiotics and despite this the wound continues to worsen.  Due to this I recommended admission to the hospital for IV antibiotics and likely surgery for at least partial toe amputation.  -We contacted the emergency department at [facility]. Discussed with Dr. Malvin. -Patient encouraged to call the office with any questions, concerns, change in symptoms.   [name] DPM

## 2023-10-29 ENCOUNTER — Encounter (HOSPITAL_COMMUNITY): Payer: Self-pay | Admitting: Podiatry

## 2023-10-29 ENCOUNTER — Other Ambulatory Visit (HOSPITAL_COMMUNITY): Payer: Self-pay

## 2023-10-29 DIAGNOSIS — M86171 Other acute osteomyelitis, right ankle and foot: Secondary | ICD-10-CM | POA: Diagnosis not present

## 2023-10-29 LAB — CBC
HCT: 50.2 % (ref 39.0–52.0)
Hemoglobin: 17.1 g/dL — ABNORMAL HIGH (ref 13.0–17.0)
MCH: 30.3 pg (ref 26.0–34.0)
MCHC: 34.1 g/dL (ref 30.0–36.0)
MCV: 89 fL (ref 80.0–100.0)
Platelets: 117 K/uL — ABNORMAL LOW (ref 150–400)
RBC: 5.64 MIL/uL (ref 4.22–5.81)
RDW: 14.6 % (ref 11.5–15.5)
WBC: 4.8 K/uL (ref 4.0–10.5)
nRBC: 0 % (ref 0.0–0.2)

## 2023-10-29 LAB — COMPREHENSIVE METABOLIC PANEL WITH GFR
ALT: 21 U/L (ref 0–44)
AST: 22 U/L (ref 15–41)
Albumin: 3.1 g/dL — ABNORMAL LOW (ref 3.5–5.0)
Alkaline Phosphatase: 53 U/L (ref 38–126)
Anion gap: 12 (ref 5–15)
BUN: 11 mg/dL (ref 8–23)
CO2: 25 mmol/L (ref 22–32)
Calcium: 8.5 mg/dL — ABNORMAL LOW (ref 8.9–10.3)
Chloride: 101 mmol/L (ref 98–111)
Creatinine, Ser: 0.8 mg/dL (ref 0.61–1.24)
GFR, Estimated: >60 mL/min (ref >60)
Glucose, Bld: 119 mg/dL — ABNORMAL HIGH (ref 70–99)
Potassium: 3.5 mmol/L (ref 3.5–5.1)
Sodium: 138 mmol/L (ref 135–145)
Total Bilirubin: 1.1 mg/dL (ref 0.0–1.2)
Total Protein: 6.3 g/dL — ABNORMAL LOW (ref 6.5–8.1)

## 2023-10-29 LAB — GLUCOSE, CAPILLARY
Glucose-Capillary: 109 mg/dL — ABNORMAL HIGH (ref 70–99)
Glucose-Capillary: 147 mg/dL — ABNORMAL HIGH (ref 70–99)

## 2023-10-29 LAB — MAGNESIUM: Magnesium: 2 mg/dL (ref 1.7–2.4)

## 2023-10-29 MED ORDER — DOXYCYCLINE HYCLATE 100 MG PO TABS
100.0000 mg | ORAL_TABLET | Freq: Two times a day (BID) | ORAL | 0 refills | Status: DC
  Filled 2023-10-29: qty 10, 5d supply, fill #0

## 2023-10-29 NOTE — Progress Notes (Signed)
  Subjective:  Patient ID: [name], male    DOB: May 24, 1955,  MRN: [id number]  Chief Complaint  Patient presents with   Wound Infection    DOS: 10/28/23 Procedure: 1. Third toe amputation at level of proximal phalanx head, right foot   68 y.o. male seen for post op check. He reports he is doing well denies much pain. He has post op shoe and aware of findings from surgery as well as follow up plans.   Review of Systems: Negative except as noted in the HPI. Denies N/V/F/Ch.   Objective:   Constitutional Well developed. Well nourished.  Vascular Foot warm and well perfused. Capillary refill normal to all digits.   No calf pain with palpation  Neurologic Normal speech. Oriented to person, place, and time. Epicritic sensation diminished to toes   Dermatologic Dressing C/D/I to right foot  Orthopedic: S/p R 3rd toe amp at proximal phalanx head level   Radiographs: Interval amputation of the third toe at the level of the distal third proximal phalanx.  Pathology: pending  Micro: pending  Assessment:   1. Other acute osteomyelitis of right foot (HCC)   Osteomyelitis of right 3rd toe s/p R 3rd toe amputation  Plan:  Patient was evaluated and treated and all questions answered.  POD # 1 s/p R 3rd toe amputation -Progressing as expected post op, pain controlled dressing C/D/I -XR: expected post op changes -WB Status: WBAT in post op shoe -Sutures: remain intact 2-3 weeks. -Medications/ABX: Recommend 5 days doxycycline  on discharge -Dressing: Remain C/D/I until follow up next week - F/u Plan: Stable for DC home today. Follow up next week Tuesday with me for dsg change. Office to call to arrange.         Marolyn JULIANNA Honour, DPM [facility] / [facility]

## 2023-10-29 NOTE — Discharge Summary (Signed)
 Physician Discharge Summary  [name] FMW:[id number] DOB: December 06, 1955 DOA: 10/27/2023  PCP: Charlott Dorn LABOR, MD  Admit date: 10/27/2023 Discharge date: 10/29/2023  Admitted From: home Disposition:  home  Recommendations for Outpatient Follow-up:  Follow up with PCP in 1-2 weeks Follow-up with podiatry as scheduled  Home Health: none Equipment/Devices: none  Discharge Condition: stable CODE STATUS: Full code Diet Orders (From admission, onward)     Start     Ordered   10/28/23 1355  Diet Carb Modified Fluid consistency: Thin  Diet effective now       Question Answer Comment  Diet-HS Snack? Nothing   Calorie Level Medium 1600-2000   Fluid consistency: Thin      10/28/23 1354            Brief Narrative / Interim history: 68 year old male with OSA on CPAP, HTN, HLD, DM 2, CAD status post stenting comes into the hospital with right third toenail removal 2 weeks ago, with increased redness and swelling.  Was seen by podiatry who referred him to the ER to be admitted for IV antibiotics and surgical intervention.  MRI of the right foot on admission showed findings suspicious for osteomyelitis in the distal left third phalanx.  He was placed on antibiotics and admitted to the hospital  Hospital Course / Discharge diagnoses: Principal problem Osteomyelitis of the right third toe -has been placed on antibiotics, podiatry consulted and he was taken to the OR on 7/16, status post third toe amputation with proximal phalanx head.  Cultures were sent, and it was felt that clean margins were obtained.  He will be placed on doxycycline  for 5 additional days per podiatry recommendations, he recovered well postoperatively and will be discharged home in stable condition.  He is to ambulate in the boot, weight-bear as tolerated, follow-up with podiatry as an outpatient for dressing change in a few days.  Active problems CAD-with stenting, no chest pain, continue home  medications Essential hypertension-continue home medications Hyperlipidemia-continue statin OSA-continue CPAP Obesity, class II-BMI 37 DM2, poorly controlled, with hyperglycemia -A1c 8.0, patient is surprised about it.  Continue home regimen and outpatient follow-up  Sepsis ruled out   Discharge Instructions   Allergies as of 10/29/2023       Reactions   Glucophage [metformin] Other (See Comments)   Unknown reaction   Mounjaro [tirzepatide] Diarrhea, Other (See Comments)   Bloating  Sulfurous burps GI intolerance    Ozempic (0.25 Or 0.5 Mg-dose) [semaglutide(0.25 Or 0.5mg -dos)] Diarrhea, Other (See Comments)   Bloating  Sulfurous burps GI intolerance    Penicillins Rash        Medication List     TAKE these medications    acetaminophen  650 MG CR tablet Commonly known as: TYLENOL  Take 1,300 mg by mouth at bedtime.   aspirin  EC 325 MG tablet Take 325 mg by mouth at bedtime.   atorvastatin  80 MG tablet Commonly known as: LIPITOR  Take 80 mg by mouth at bedtime.   celecoxib  200 MG capsule Commonly known as: CELEBREX  TAKE 1 CAPSULE BY MOUTH EVERY DAY What changed:  how much to take when to take this   colesevelam  625 MG tablet Commonly known as: WELCHOL  Take 1,875 mg by mouth at bedtime.   doxycycline  100 MG capsule Commonly known as: VIBRAMYCIN  Take 1 capsule (100 mg total) by mouth 2 (two) times daily for 5 days.   empagliflozin  25 MG Tabs tablet Commonly known as: JARDIANCE  Take 1 tablet (25 mg total) by mouth daily.  ezetimibe  10 MG tablet Commonly known as: ZETIA  TAKE 1 TABLET BY MOUTH EVERY DAY   isosorbide  mononitrate 120 MG 24 hr tablet Commonly known as: IMDUR  TAKE 1 TABLET BY MOUTH EVERY DAY   metoprolol  succinate 100 MG 24 hr tablet Commonly known as: TOPROL -XL Take 1 tablet (100 mg total) by mouth daily. Take with or immediately following a meal.   Multivitamin Men 50+ Tabs Take 1 tablet by mouth daily.   nitroGLYCERIN  0.4 MG SL  tablet Commonly known as: Nitrostat  Place 1 tablet (0.4 mg total) under the tongue every 5 (five) minutes x 3 doses as needed for chest pain.   omeprazole 20 MG capsule Commonly known as: PRILOSEC Take 20 mg by mouth at bedtime.   pregabalin  150 MG capsule Commonly known as: LYRICA  Take 1 capsule (150 mg total) by mouth 2 (two) times daily.   senna-docusate 8.6-50 MG tablet Commonly known as: Senokot S Take 1 tablet by mouth at bedtime as needed. What changed:  how much to take when to take this   sodium chloride  0.65 % Soln nasal spray Commonly known as: OCEAN Place 1-2 sprays into both nostrils every 4 (four) hours as needed (sinus irritation).   SYSTANE OP Place 1 drop into both eyes 2 (two) times daily.   triamterene -hydrochlorothiazide  75-50 MG tablet Commonly known as: MAXZIDE  Take 0.5 tablets by mouth daily.   UNABLE TO FIND Inhale 1 Device into the lungs at bedtime. CPAP   VITAMIN D-3 PO Take 1 capsule by mouth daily.       Consultations: Podiatry   Procedures/Studies:  DG Foot 2 Views Right Result Date: 10/28/2023 CLINICAL DATA:  Postoperative third toe amputation. EXAM: RIGHT FOOT - 2 VIEW COMPARISON:  Right foot x-ray 10/27/2023 FINDINGS: There is been interval amputation of the third toe at the level of the distal third proximal phalanx. No acute fracture or dislocation. Degenerative changes of the midfoot and first metatarsophalangeal joint appear unchanged. Pes planus deformity again seen. Soft tissues are within normal limits. IMPRESSION: Interval amputation of the third toe at the level of the distal third proximal phalanx. Electronically Signed   By: Greig Pique M.D.   On: 10/28/2023 17:17   MR TOES RIGHT WO CONTRAST Result Date: 10/27/2023 CLINICAL DATA:  eval for OM R 3rd toe History of diabetes and neuropathic joint. EXAM: MRI OF THE RIGHT TOES WITHOUT CONTRAST TECHNIQUE: Multiplanar, multisequence MR imaging of the right toes was performed. No  intravenous contrast was administered. COMPARISON:  Radiographs 10/27/2023 and 12/18/2022.  MRI 09/18/2021. FINDINGS: Bones/Joint/Cartilage Mild clawtoe deformities of the 2nd through 5th digits. The distal 3rd phalanx has an abnormal appearance with decreased T1 and increased T2 marrow signal. The cortex also appears ill-defined, findings suspicious for osteomyelitis. The middle and proximal phalanges appear normal. The additional toes and distal metatarsals appear normal. There are no significant joint effusions. Degenerative changes at the 1st metatarsophalangeal joint are stable. Known Charcot changes within the midfoot are not imaged on this examination. Ligaments The collateral ligaments of the metatarsophalangeal joints are intact. Muscles and Tendons Chronic fatty atrophy throughout the forefoot musculature. The forefoot tendons are intact without significant tenosynovitis. Soft tissues Possible soft tissue ulceration along the 3rd nail bed with underlying soft tissue inflammatory changes, but no organized fluid collection. Mild dorsal subcutaneous edema, similar to previous MRI. IMPRESSION: 1. Findings suspicious for osteomyelitis of the distal 3rd phalanx. 2. Possible soft tissue ulceration along the 3rd nail bed with underlying soft tissue inflammatory changes, but no  organized fluid collection. 3. Stable degenerative changes at the 1st metatarsophalangeal joint. Known Charcot changes within the midfoot are not imaged on this examination. Electronically Signed   By: Elsie Perone M.D.   On: 10/27/2023 17:38   VAS US  ABI WITH/WO TBI Result Date: 10/27/2023  LOWER EXTREMITY DOPPLER STUDY Patient Name:  [name]  Date of Exam:   10/27/2023 Medical Rec #: [id number]       Accession #:    [id number] Date of Birth: 08/17/1955       Patient Gender: M Patient Age:   58 years Exam Location:  [facility] Procedure:      VAS US  ABI WITH/WO TBI Referring Phys: MARSA HONOUR  --------------------------------------------------------------------------------  Indications: Ulceration. High Risk Factors: Hypertension, hyperlipidemia, Diabetes, no history of                    smoking, coronary artery disease. 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  Comparison Study: Previous exam 12/11/2022 Performing Technologist: Leigh Rom RVT/RDMS  Examination Guidelines: A complete evaluation includes at minimum, Doppler waveform signals and systolic blood pressure reading at the level of bilateral brachial, anterior tibial, and posterior tibial arteries, when vessel segments are accessible. Bilateral testing is considered an integral part of a complete examination. Photoelectric Plethysmograph (PPG) waveforms and toe systolic pressure readings are included as required and additional duplex testing as needed. Limited examinations for reoccurring indications may be performed as noted.  ABI Findings: +---------+------------------+-----+---------+--------+ Right    Rt Pressure (mmHg)IndexWaveform Comment  +---------+------------------+-----+---------+--------+ Brachial 136                    triphasic         +---------+------------------+-----+---------+--------+ PTA      143               1.05 triphasic         +---------+------------------+-----+---------+--------+ DP       131               0.96 triphasic         +---------+------------------+-----+---------+--------+ Great Toe88                0.65 Normal            +---------+------------------+-----+---------+--------+ +---------+------------------+-----+---------+-------+ Left     Lt Pressure (mmHg)IndexWaveform Comment +---------+------------------+-----+---------+-------+ Brachial 122                    triphasic        +---------+------------------+-----+---------+-------+ PTA      140               1.03 triphasic        +---------+------------------+-----+---------+-------+ DP        136               1.00 triphasic        +---------+------------------+-----+---------+-------+ Great Toe104               0.76 Normal           +---------+------------------+-----+---------+-------+ +-------+-----------+-----------+------------+------------+ ABI/TBIToday's ABIToday's TBIPrevious ABIPrevious TBI +-------+-----------+-----------+------------+------------+ Right  1.05       0.65       1.23        1.30         +-------+-----------+-----------+------------+------------+ Left   1.03       0.76       1.17        1.09         +-------+-----------+-----------+------------+------------+  Summary: Right: Resting right ankle-brachial index is within normal range. The right toe-brachial index is abnormal. Left: Resting left ankle-brachial index is within normal range. The left toe-brachial index is normal. *See table(s) above for measurements and observations.     Preliminary      Subjective: - no chest pain, shortness of breath, no abdominal pain, nausea or vomiting.   Discharge Exam: BP (!) 142/85 (BP Location: Left Arm)   Pulse 72   Temp 98.7 F (37.1 C)   Resp 18   Ht (P) 5' 7 (1.702 m)   Wt (P) 108.9 kg   SpO2 93%   BMI (P) 37.59 kg/m   General: Pt is alert, awake, not in acute distress Cardiovascular: RRR, S1/S2 +, no rubs, no gallops Respiratory: CTA bilaterally, no wheezing, no rhonchi Abdominal: Soft, NT, ND, bowel sounds + Extremities: no edema, no cyanosis    The results of significant diagnostics from this hospitalization (including imaging, microbiology, ancillary and laboratory) are listed below for reference.     Microbiology: Recent Results (from the past 240 hours)  Blood culture (routine x 2)     Status: None (Preliminary result)   Collection Time: 10/27/23  2:48 PM   Specimen: BLOOD  Result Value Ref Range Status   Specimen Description BLOOD RIGHT ANTECUBITAL  Final   Special Requests   Final    BOTTLES DRAWN AEROBIC AND  ANAEROBIC Blood Culture adequate volume   Culture   Final    NO GROWTH 2 DAYS Performed at [facility], [address], [place], [place] [address]    Report Status PENDING  Incomplete  Blood culture (routine x 2)     Status: None (Preliminary result)   Collection Time: 10/27/23  8:27 PM   Specimen: BLOOD LEFT FOREARM  Result Value Ref Range Status   Specimen Description BLOOD LEFT FOREARM  Final   Special Requests   Final    BOTTLES DRAWN AEROBIC AND ANAEROBIC Blood Culture results may not be optimal due to an inadequate volume of blood received in culture bottles   Culture   Final    NO GROWTH 2 DAYS Performed at [facility], [address] [place], [place] [address]    Report Status PENDING  Incomplete  Aerobic/Anaerobic Culture w Gram Stain (surgical/deep wound)     Status: None (Preliminary result)   Collection Time: 10/28/23  1:39 PM   Specimen: Toe, Right; Amputation  Result Value Ref Range Status   Specimen Description TISSUE  Final   Special Requests 3RD RIGHT TOE  Final   Gram Stain NO WBC SEEN NO ORGANISMS SEEN   Final   Culture   Final    CULTURE REINCUBATED FOR BETTER GROWTH Performed at [facility], [address], [place], [place] [address]    Report Status PENDING  Incomplete     Labs: Basic Metabolic Panel: Recent Labs  Lab 10/27/23 1448 10/27/23 2302 10/28/23 0440 10/29/23 0558  NA 139  --  136 138  K 3.9  --  3.4* 3.5  CL 103  --  100 101  CO2 23  --  24 25  GLUCOSE 244*  --  130* 119*  BUN 17  --  15 11  CREATININE 1.18 0.96 0.85 0.80  CALCIUM  9.3  --  8.8* 8.5*  MG  --   --   --  2.0   Liver Function Tests: Recent Labs  Lab 10/27/23 1448 10/28/23 0440 10/29/23 0558  [name]  24 21 22   ALT 26 23 21   ALKPHOS 59 53 53  BILITOT 1.0 0.9 1.1  PROT 6.7 6.2* 6.3*  ALBUMIN 3.6 3.4* 3.1*   CBC: Recent Labs  Lab 10/27/23 1448 10/27/23 2302 10/28/23 0440 10/29/23 0558  WBC 7.0 7.9 6.1 4.8  NEUTROABS 4.8  --   --   --    HGB 17.2* 16.2 16.4 17.1*  HCT 50.7 48.7 49.2 50.2  MCV 88.6 88.5 89.0 89.0  PLT 148* 123* 126* 117*   CBG: Recent Labs  Lab 10/28/23 1715 10/28/23 2201 10/28/23 2334 10/29/23 0345 10/29/23 0749  GLUCAP 108* 118* 124* 109* 147*   Hgb A1c Recent Labs    10/27/23 2302  HGBA1C 8.0*   Lipid Profile No results for input(s): CHOL, HDL, LDLCALC, TRIG, CHOLHDL, LDLDIRECT in the last 72 hours. Thyroid function studies No results for input(s): TSH, T4TOTAL, T3FREE, THYROIDAB in the last 72 hours.  Invalid input(s): FREET3 Urinalysis    Component Value Date/Time   COLORURINE AMBER (A) 09/10/2022 1800   APPEARANCEUR CLEAR 09/10/2022 1800   LABSPEC 1.016 09/10/2022 1800   PHURINE 6.0 09/10/2022 1800   GLUCOSEU >=500 (A) 09/10/2022 1800   HGBUR SMALL (A) 09/10/2022 1800   BILIRUBINUR SMALL (A) 09/10/2022 1800   KETONESUR NEGATIVE 09/10/2022 1800   PROTEINUR 100 (A) 09/10/2022 1800   NITRITE NEGATIVE 09/10/2022 1800   LEUKOCYTESUR NEGATIVE 09/10/2022 1800    FURTHER DISCHARGE INSTRUCTIONS:   Get Medicines reviewed and adjusted: Please take all your medications with you for your next visit with your Primary MD   Laboratory/radiological data: Please request your Primary MD to go over all hospital tests and procedure/radiological results at the follow up, please ask your Primary MD to get all Hospital records sent to his/her office.   In some cases, they will be blood work, cultures and biopsy results pending at the time of your discharge. Please request that your primary care M.D. goes through all the records of your hospital data and follows up on these results.   Also Note the following: If you experience worsening of your admission symptoms, develop shortness of breath, life threatening emergency, suicidal or homicidal thoughts you must seek medical attention immediately by calling 911 or calling your MD immediately  if symptoms less severe.   You  must read complete instructions/literature along with all the possible adverse reactions/side effects for all the Medicines you take and that have been prescribed to you. Take any new Medicines after you have completely understood and accpet all the possible adverse reactions/side effects.    Do not drive when taking Pain medications or sleeping medications (Benzodaizepines)   Do not take more than prescribed Pain, Sleep and Anxiety Medications. It is not advisable to combine anxiety,sleep and pain medications without talking with your primary care practitioner   Special Instructions: If you have smoked or chewed Tobacco  in the last 2 yrs please stop smoking, stop any regular Alcohol  and or any Recreational drug use.   Wear Seat belts while driving.   Please note: You were cared for by a hospitalist during your hospital stay. Once you are discharged, your primary care physician will handle any further medical issues. Please note that NO REFILLS for any discharge medications will be authorized once you are discharged, as it is imperative that you return to your primary care physician (or establish a relationship with a primary care physician if you do not have one) for your post hospital discharge needs so that  they can reassess your need for medications and monitor your lab values.  Time coordinating discharge: 35 minutes  SIGNED:  Nilda Fendt, MD, PhD 10/29/2023, 9:13 AM

## 2023-10-30 LAB — SURGICAL PATHOLOGY

## 2023-11-01 LAB — CULTURE, BLOOD (ROUTINE X 2)
Culture: NO GROWTH
Culture: NO GROWTH
Special Requests: ADEQUATE

## 2023-11-02 LAB — AEROBIC/ANAEROBIC CULTURE W GRAM STAIN (SURGICAL/DEEP WOUND): Gram Stain: NONE SEEN

## 2023-11-03 ENCOUNTER — Encounter: Payer: Self-pay | Admitting: Podiatry

## 2023-11-03 ENCOUNTER — Ambulatory Visit (INDEPENDENT_AMBULATORY_CARE_PROVIDER_SITE_OTHER): Admitting: Podiatry

## 2023-11-03 ENCOUNTER — Ambulatory Visit (INDEPENDENT_AMBULATORY_CARE_PROVIDER_SITE_OTHER)

## 2023-11-03 DIAGNOSIS — S98131A Complete traumatic amputation of one right lesser toe, initial encounter: Secondary | ICD-10-CM | POA: Diagnosis not present

## 2023-11-03 MED ORDER — DOXYCYCLINE HYCLATE 100 MG PO TABS: 100.0000 mg | ORAL_TABLET | Freq: Two times a day (BID) | ORAL | 0 refills | Status: DC

## 2023-11-03 NOTE — Anesthesia Postprocedure Evaluation (Signed)
 Anesthesia Post Note  Patient: [name]  Procedure(s) Performed: AMPUTATION, TOE (Right: Toe)     Patient location during evaluation: PACU Anesthesia Type: MAC Level of consciousness: awake and alert Pain management: pain level controlled Vital Signs Assessment: post-procedure vital signs reviewed and stable Respiratory status: spontaneous breathing, nonlabored ventilation and respiratory function stable Cardiovascular status: stable and blood pressure returned to baseline Postop Assessment: no apparent nausea or vomiting Anesthetic complications: no   No notable events documented.                Jadea Shiffer

## 2023-11-04 ENCOUNTER — Other Ambulatory Visit: Payer: Self-pay | Admitting: Podiatry

## 2023-11-04 MED ORDER — PREGABALIN 150 MG PO CAPS: 150.0000 mg | ORAL_CAPSULE | Freq: Two times a day (BID) | ORAL | 0 refills | Status: DC

## 2023-11-07 NOTE — Progress Notes (Signed)
 Subjective: Chief Complaint  Patient presents with   Routine Post Op    Rm11 Post op 1 #rd toe amputation right foot/ pt says he is doing well   68 year old male presents the office today for postop visit #1 status post right third toe amputation.  He has been feeling well.  He does not endorse any fevers or chills.  He finishes the antibiotics today.  Objective: AAO x3, NAD DP/PT pulses palpable bilaterally, CRT less than 3 seconds Status post right third toe amputation.  Sutures intact without any evidence of dehiscence.  There is slight erythema around the incision but there is no ascending cellulitis.  No drainage or pus noted.  There is no fluctuation or crepitation.  There is no malodor. No pain with calf compression, swelling, warmth, erythema  Assessment: Status post toe amputation  Plan: -All treatment options discussed with the patient including all alternatives, risks, complications.  -X-rays obtained reviewed.  3 views obtained.  Status post right third toe amputation with sharp resection margins.  Digital contractures present.  Midfoot arthritis.  Calcaneal spurring present.  Flatfoot is evident. -Incisions healing well at this time.  Clean the area.  Small-moderate antibiotic ointment was applied followed by dressing.  Discussed if needed start to wash the foot with soap and water, dry thoroughly and apply a similar bandage later this week.  Later this week. -Will continue antibiotics.  Prescribed doxycycline . -Remain in surgical shoe, limited weight bearing.  Elevation. -Monitor for any clinical signs or symptoms of infection and directed to call the office immediately should any occur or go to the ER. -Patient encouraged to call the office with any questions, concerns, change in symptoms.   [name] DPM

## 2023-11-09 DIAGNOSIS — D485 Neoplasm of uncertain behavior of skin: Secondary | ICD-10-CM | POA: Diagnosis not present

## 2023-11-09 DIAGNOSIS — L821 Other seborrheic keratosis: Secondary | ICD-10-CM | POA: Diagnosis not present

## 2023-11-09 DIAGNOSIS — C44319 Basal cell carcinoma of skin of other parts of face: Secondary | ICD-10-CM | POA: Diagnosis not present

## 2023-11-12 ENCOUNTER — Ambulatory Visit: Admitting: Podiatry

## 2023-11-12 DIAGNOSIS — S98131A Complete traumatic amputation of one right lesser toe, initial encounter: Secondary | ICD-10-CM

## 2023-11-12 NOTE — Progress Notes (Signed)
 Subjective: Chief Complaint  Patient presents with   Post-op Follow-up    Right foot. Sutures intact. Patient denies drainage. Has changed dressing once since last visit.    Procedure: Right partial third toe amputation DOS: 10/28/2023  68 year old male presents the office today for postop visit #2 status post right third toe amputation.  He presents today for possible suture removal.  He has been doing well.  He finished a course of antibiotics.  Denies any fevers or chills.  He did change the bandage once.  No other concerns.  Objective: AAO x3, NAD DP/PT pulses palpable bilaterally, CRT less than 3 seconds Status post right third toe amputation.  Sutures intact without any evidence of dehiscence.  There is some scabbing present along the incision.  There is still some slight edema but there is no erythema or warmth.  There is no drainage or pus.  No ascending cellulitis.  No open lesions otherwise.  As with foot we had calluses that previously these are present today but there is no underlying ulceration, drainage or signs of infection.  No pain with calf compression, swelling, warmth, erythema  Assessment: Status post toe amputation  Plan: -All treatment options discussed with the patient including all alternatives, risks, complications.  -I removed the sutures today.  Steri-Strips applied for reinforcement.  A small amount of antibiotic ointment was applied followed by dressing.  He can start to wash the foot with soap and water, dry thoroughly and apply a similar bandage.  Do not soak the foot.  - Remain in surgical shoe for now.  Elevation.  Limited activity. -Monitor for any clinical signs or symptoms of infection and directed to call the office immediately should any occur or go to the ER.  Return in about 2 weeks (around 11/26/2023).  [name] DPM

## 2023-11-24 ENCOUNTER — Ambulatory Visit: Admitting: Podiatry

## 2023-11-24 ENCOUNTER — Encounter: Payer: Self-pay | Admitting: Podiatry

## 2023-11-24 DIAGNOSIS — S98131A Complete traumatic amputation of one right lesser toe, initial encounter: Secondary | ICD-10-CM | POA: Diagnosis not present

## 2023-11-27 NOTE — Progress Notes (Signed)
 Subjective: Chief Complaint  Patient presents with   Post-op Problem    Right partial third toe amputation. NIDDM A1C 8.0. 0 pain. No feeling due to neuropathy.    Procedure: Right partial third toe amputation DOS: 10/28/2023 with Dr. Malvin   68 year old male presents the office today for postop visit status post right third toe amputation.  States he gets this moment of bloody drainage but no pus.  No creasing swelling or redness.  Does not report any fevers or chills.  Has no pain although he does have neuropathy.   Objective: AAO x3, NAD DP/PT pulses palpable bilaterally, CRT less than 3 seconds Status post right third toe amputation there is still 1 suture intact which I removed today.  Centrally there is 1 small superficial opening there is no probing to [place] or tunneling.  There is no cellulitis present there is still some localized edema present to the toe but appears to be improved.  There is no fluctuation or crepitation. No pain with calf compression, swelling, warmth, erythema  Assessment: Status post toe amputation  Plan: -All treatment options discussed with the patient including all alternatives, risks, complications.  -I remove the remainder of the stitches today.  Steri-Strip applied for reinforcement.  Antibiotic ointment was applied followed by dressing.  Discussed daily dressing changes remain surgical shoe, elevation.  Monitor for any signs or symptoms of infection or further ulcerations.  Return in about 3 weeks (around 12/15/2023) for post-op, toe amputation .  [name] DPM

## 2023-12-16 ENCOUNTER — Other Ambulatory Visit: Payer: Self-pay | Admitting: Podiatry

## 2023-12-17 ENCOUNTER — Encounter: Payer: Self-pay | Admitting: Podiatry

## 2023-12-17 ENCOUNTER — Ambulatory Visit (INDEPENDENT_AMBULATORY_CARE_PROVIDER_SITE_OTHER): Admitting: Podiatry

## 2023-12-17 VITALS — Ht 67.0 in | Wt 240.0 lb

## 2023-12-17 DIAGNOSIS — B351 Tinea unguium: Secondary | ICD-10-CM

## 2023-12-17 DIAGNOSIS — S98131A Complete traumatic amputation of one right lesser toe, initial encounter: Secondary | ICD-10-CM | POA: Diagnosis not present

## 2023-12-17 NOTE — Progress Notes (Signed)
 Subjective: Chief Complaint  Patient presents with   Routine Post Op    Pt is here to f/u on right foot due to amputation, he states everything is going well, no pain from the foot, has no complaints.    Procedure: Right partial third toe amputation DOS: 10/28/2023 with Dr. Malvin   68 year old male presents the office today for postop visit status post right third toe amputation.  He states he is healing well.  Still wearing the surgical shoe.  Son reporting open lesions.  He gave a moisturizer on his feet.  No fevers or chills or new concerns today.   Objective: AAO x3, NAD DP/PT pulses palpable bilaterally, CRT less than 3 seconds Status post right third toe amputation.  Incision appears to be healed.  There is a flap of skin that I think is coming from the way it is healed, scar tissue.  There is no open lesion identified.  There is trace edema.  No erythema.  No drainage or pus.  There is no open lesions identified bilaterally. Remain nails are hypertrophic, dystrophic with yellow discoloration.  No edema, erythema. Digital contractures present. No pain with calf compression, swelling, warmth, erythema  Assessment: Status post toe amputation, healing well  Plan: -All treatment options discussed with the patient including all alternatives, risks, complications.  -At this time begin started gradually transition to a shoe as tolerated.  Discussed important daily foot inspection for continued surgery. -As a courtesy debridement of the nails and complications or bleeding. -Glucose control.  Return in about 2 months (around 02/16/2024).  [name] DPM

## 2023-12-22 ENCOUNTER — Other Ambulatory Visit: Payer: Self-pay | Admitting: Cardiovascular Disease

## 2024-01-13 DIAGNOSIS — D751 Secondary polycythemia: Secondary | ICD-10-CM | POA: Diagnosis not present

## 2024-01-18 ENCOUNTER — Encounter: Payer: Self-pay | Admitting: Podiatry

## 2024-01-18 DIAGNOSIS — G4733 Obstructive sleep apnea (adult) (pediatric): Secondary | ICD-10-CM | POA: Diagnosis not present

## 2024-01-19 ENCOUNTER — Ambulatory Visit: Admitting: Podiatry

## 2024-01-19 ENCOUNTER — Encounter: Payer: Self-pay | Admitting: Podiatry

## 2024-01-19 VITALS — BP 154/92 | HR 74 | Temp 98.2°F

## 2024-01-19 DIAGNOSIS — L97412 Non-pressure chronic ulcer of right heel and midfoot with fat layer exposed: Secondary | ICD-10-CM

## 2024-01-19 DIAGNOSIS — L03115 Cellulitis of right lower limb: Secondary | ICD-10-CM

## 2024-01-19 MED ORDER — DEXTROSE 5 % IV SOLN: 1500.0000 mg | Freq: Once | INTRAVENOUS | 0 refills | Status: AC

## 2024-01-19 MED ORDER — DOXYCYCLINE HYCLATE 100 MG PO TABS: 100.0000 mg | ORAL_TABLET | Freq: Two times a day (BID) | ORAL | 0 refills | Status: DC

## 2024-01-19 NOTE — Progress Notes (Signed)
 Subjective: Chief Complaint  Patient presents with   Foot Pain    Rm11 Right foot medal side red and painful/A1c 8/ 1 day/neuropathy    68 year old male presents the office today for an acute appointment given blister, redness to the side of his right foot.  He states he has been doing well he just noticed this this morning.  He has been wearing crocs and has been on his feet more but no injuries that he reports.  He does not Dors any fevers or chills.   Objective: AAO x3, NAD DP/PT pulses palpable bilaterally, CRT less than 3 seconds See picture below.  Hyperkeratotic lesion noted along the plantar medial aspect of foot with a new blister just adjacent to this.  Once I debrided this there was a granular wound noted without any probing to bone, and or tunneling.  There is no drainage or pus.  No fluctuation or crepitation.  Mild increased temperature around the wound along the area the erythema. No pain with calf compression, swelling, warmth, erythema       Assessment: Ulceration right foot with cellulitis  Plan: -All treatment options discussed with the patient including all alternatives, risks, complications.  -Postoperatively the hyperkeratotic lesion to reveal ulceration centrally.  There is no drainage to culture today. -Recommend daily dressing changes.  Betadine applied today followed by dressing. -I ordered doxycycline  but were trying to get approval for Dalvance .  If he gets the infusion he can hold off on doxycycline . Order sent for this.  -IV Antibiotic Order for home-  Dalvanced 1500mg  IV x 1 dose at home  -Elevation -Surgical shoe for offloading.  -If there is any worsening he is to report to the emergency department. -Patient encouraged to call the office with any questions, concerns, change in symptoms.   Procedure: Excisional Debridement of Wound Tool: Sharp #312 chisel blade/tissue nipper Type of Debridement: Sharp Excisional Frequency: @Once  weekly until  appropriately healed.  Dressing is to be changed daily/keeping the wound clean and dry Rationale: Removal of non-viable soft tissue from the wound to promote healing.  Anesthesia: none Pre-Debridement Wound Measurements: Not able to measure prior to debridement as it was covered with callus Post-Debridement Wound Measurements: 0.5 cm x 0.5 cm x 0.3 cm  Area devitalized tissue removed(nonviable tissue only): 0.5 cm x 0.5 cm.  Blood loss: Minimal (<50cc) Depth of Debridement: with fat layer exposed Description of tissue removed: Non-viable tissue Technique: The wound and the surrounding skin were prepped and draped in usual aseptic fashion.  Aseptic technique was maintained throughout the procedure.  Using #312 blade/tissue nipper sharp debridement of necrotic/nonviable tissue was performed until healthy bleeding wound bed was achieved.  No underlying bone or tendon was exposed during debridement.  The wound was thoroughly irrigated with normal saline solution Wound Progress:  Current Wound Volume: Debridement was performed of the chronic nonhealing diabetic foot wound on right foot Navicular.  Debridement removed 0.5 cm x 0.5 cm of the necrotic tissue and subcutaneous tissue and none amount of purulent drainage was not present. Presence/absence of tissue: Necrotic tissue/nonviable tissue present at the base of the wound.  Sharp debridement was performed to remove the necrotic tissue/nonviable tissue back to viable tissue.  No devitalized/nonviable tissue present postdebridement.  Wound appeared clean and clear of infection No material in the wound was present that was identified to be inhibiting healing. Dressing: Betadine applied followed by dry, sterile, compression dressing. Disposition: Patient tolerated procedure well. Patient to return in 1 week for follow-up  or as listed above.   [name] DPM

## 2024-01-19 NOTE — Patient Instructions (Signed)
 Monitor for any signs/symptoms of infection. Call the office immediately if any occur or go directly to the emergency room. Call with any questions/concerns.

## 2024-01-22 DIAGNOSIS — M869 Osteomyelitis, unspecified: Secondary | ICD-10-CM | POA: Diagnosis not present

## 2024-01-22 DIAGNOSIS — L0390 Cellulitis, unspecified: Secondary | ICD-10-CM | POA: Diagnosis not present

## 2024-01-26 ENCOUNTER — Encounter: Payer: Self-pay | Admitting: Podiatry

## 2024-01-26 ENCOUNTER — Ambulatory Visit (INDEPENDENT_AMBULATORY_CARE_PROVIDER_SITE_OTHER): Admitting: Podiatry

## 2024-01-26 VITALS — BP 126/76 | HR 72 | Temp 97.0°F

## 2024-01-26 DIAGNOSIS — L97412 Non-pressure chronic ulcer of right heel and midfoot with fat layer exposed: Secondary | ICD-10-CM | POA: Diagnosis not present

## 2024-01-26 DIAGNOSIS — L03115 Cellulitis of right lower limb: Secondary | ICD-10-CM | POA: Diagnosis not present

## 2024-01-26 NOTE — Progress Notes (Unsigned)
 Subjective: Chief Complaint  Patient presents with   Diabetic Ulcer    Rm13 Patient f/u ulceration right foot/diabetic neurapathy/ wound looks good with no drainage or discoloration.    68 year old male presents the office today for follow-up evaluation of right foot ulceration.  Has been on doxycycline  he also had 1 infusion of Dalvance .   Objective: AAO x3, NAD DP/PT pulses palpable bilaterally, CRT less than 3 seconds  No pain with calf compression, swelling, warmth, erythema  Assessment: Ulceration right foot with cellulitis  Plan: -All treatment options discussed with the patient including all alternatives, risks, complications.  -Postoperatively the hyperkeratotic lesion to reveal ulceration centrally.  There is no drainage to culture today. -Recommend daily dressing changes.  Betadine applied today followed by dressing. -I ordered doxycycline  but were trying to get approval for Dalvance .  If he gets the infusion he can hold off on doxycycline . Order sent for this.  -IV Antibiotic Order for home-  Dalvanced 1500mg  IV x 1 dose at home  -Elevation -Surgical shoe for offloading.  -If there is any worsening he is to report to the emergency department. -Patient encouraged to call the office with any questions, concerns, change in symptoms.   Procedure: Excisional Debridement of Wound Tool: Sharp #312 chisel blade/tissue nipper Type of Debridement: Sharp Excisional Frequency: @Once  weekly until appropriately healed.  Dressing is to be changed daily/keeping the wound clean and dry Rationale: Removal of non-viable soft tissue from the wound to promote healing.  Anesthesia: none Pre-Debridement Wound Measurements: Not able to measure prior to debridement as it was covered with callus Post-Debridement Wound Measurements: 0.5 cm x 0.5 cm x 0.3 cm  Area devitalized tissue removed(nonviable tissue only): 0.5 cm x 0.5 cm.  Blood loss: Minimal (<50cc) Depth of Debridement: with fat  layer exposed Description of tissue removed: Non-viable tissue Technique: The wound and the surrounding skin were prepped and draped in usual aseptic fashion.  Aseptic technique was maintained throughout the procedure.  Using #312 blade/tissue nipper sharp debridement of necrotic/nonviable tissue was performed until healthy bleeding wound bed was achieved.  No underlying bone or tendon was exposed during debridement.  The wound was thoroughly irrigated with normal saline solution Wound Progress:  Current Wound Volume: Debridement was performed of the chronic nonhealing diabetic foot wound on right foot Navicular.  Debridement removed 0.5 cm x 0.5 cm of the necrotic tissue and subcutaneous tissue and none amount of purulent drainage was not present. Presence/absence of tissue: Necrotic tissue/nonviable tissue present at the base of the wound.  Sharp debridement was performed to remove the necrotic tissue/nonviable tissue back to viable tissue.  No devitalized/nonviable tissue present postdebridement.  Wound appeared clean and clear of infection No material in the wound was present that was identified to be inhibiting healing. Dressing: Betadine applied followed by dry, sterile, compression dressing. Disposition: Patient tolerated procedure well. Patient to return in 1 week for follow-up or as listed above.   [name] DPM

## 2024-01-28 DIAGNOSIS — G4733 Obstructive sleep apnea (adult) (pediatric): Secondary | ICD-10-CM | POA: Diagnosis not present

## 2024-01-28 DIAGNOSIS — G629 Polyneuropathy, unspecified: Secondary | ICD-10-CM | POA: Diagnosis not present

## 2024-01-28 DIAGNOSIS — Z6835 Body mass index (BMI) 35.0-35.9, adult: Secondary | ICD-10-CM | POA: Diagnosis not present

## 2024-01-28 DIAGNOSIS — E66812 Obesity, class 2: Secondary | ICD-10-CM | POA: Diagnosis not present

## 2024-01-28 DIAGNOSIS — E1165 Type 2 diabetes mellitus with hyperglycemia: Secondary | ICD-10-CM | POA: Diagnosis not present

## 2024-01-28 DIAGNOSIS — I10 Essential (primary) hypertension: Secondary | ICD-10-CM | POA: Diagnosis not present

## 2024-01-28 DIAGNOSIS — I25119 Atherosclerotic heart disease of native coronary artery with unspecified angina pectoris: Secondary | ICD-10-CM | POA: Diagnosis not present

## 2024-02-05 ENCOUNTER — Ambulatory Visit: Attending: Cardiovascular Disease | Admitting: Cardiovascular Disease

## 2024-02-05 ENCOUNTER — Encounter: Payer: Self-pay | Admitting: Cardiovascular Disease

## 2024-02-05 VITALS — BP 117/74 | HR 85 | Resp 17 | Ht 67.0 in | Wt 242.0 lb

## 2024-02-05 DIAGNOSIS — D751 Secondary polycythemia: Secondary | ICD-10-CM

## 2024-02-05 DIAGNOSIS — I2510 Atherosclerotic heart disease of native coronary artery without angina pectoris: Secondary | ICD-10-CM | POA: Diagnosis not present

## 2024-02-05 DIAGNOSIS — I10 Essential (primary) hypertension: Secondary | ICD-10-CM | POA: Diagnosis not present

## 2024-02-05 DIAGNOSIS — E782 Mixed hyperlipidemia: Secondary | ICD-10-CM

## 2024-02-05 DIAGNOSIS — E1159 Type 2 diabetes mellitus with other circulatory complications: Secondary | ICD-10-CM

## 2024-02-05 DIAGNOSIS — G4733 Obstructive sleep apnea (adult) (pediatric): Secondary | ICD-10-CM | POA: Diagnosis not present

## 2024-02-05 NOTE — Progress Notes (Signed)
 Cardiology Office Note:  .   Date:  02/07/2024  ID:  [name], DOB 1956/02/27, MRN [id number] PCP: Charlott Dorn LABOR, MD  [facility] Providers Cardiologist:  Jerel Balding, MD    History of Present Illness: .   [name] is a 68 y.o. male with a longstanding history of CAD (right coronary stent 1997, chronic total occlusion by catheterization 2008, 80-90% stenosis of small distal left circumflex), type 2 diabetes mellitus complicated by neuropathy and right Charcot foot arthropathy, mixed hyperlipidemia, hypertension, OSA on CPAP, morbid obesity, recently hospitalized with osteomyelitis of the left foot complicated by sepsis and demand myocardial infarction, had amputation of left second toe and more recently amputation of the right third toe.  His activity remains limited by his Charcot's foot.  He is wearing a special shoe on his right foot because of a instep callus that has blistered open.  Otherwise he has no cardiovascular complaints.  He denies problems with chest pain or shortness of breath at rest or with his limited activity.  He has not had dizziness, palpitations, syncope.  He denies lower extremity edema, orthopnea or PND and he has not had any focal neurological events.  He had repeat assessment of his lower extremity arterial supply before his amputation in July that still showed normal ABIs bilaterally, although he has diminished TBI's on the right.  He reports compliance with CPAP and denies daytime hypersomnolence.  He has lost a bit of weight on Ozempic, but has been unable to tolerate any higher doses except the starting dose of 0.25 mg due to GI side effects.  He remains severely obese with a BMI of almost 48.  Previous nuclear stress testing has shown low risk findings (inferior wall scar without meaningful areas of ischemia on scans in 2019 and 2022).  His cardiac catheterization in 2008 showed good left-to-right collaterals.  Following his  episode of demand infarction in May 2020 4 repeat echocardiography showed normal regional wall motion and unchanged EF calculated at 57%.  The right ventricle was described as moderately enlarged with normal systolic function and the right atrium was moderately dilated.  Dr. Charlott has started a workup for polycythemia.  Studies Reviewed: SABRA   EKG Interpretation Date/Time:  Friday February 05 2024 16:12:25 EDT Ventricular Rate:  57 PR Interval:  210 QRS Duration:  98 QT Interval:  446 QTC Calculation: 434 R Axis:   -46  Text Interpretation: Sinus bradycardia with 1st degree A-V block Left axis deviation Incomplete right bundle branch block Left anterior fasicular block When compared with ECG of 07-Sep-2022 16:13, axis has shifted left Confirmed by Sherelle Castelli ([id number]) on 02/05/2024 4:51:31 PM   ECG is not ordered today.  Personally reviewed the electrocardiogram from 05/29/2024Which shows sinus rhythm, incomplete RBBB, left axis deviation due to left anterior fascicular block, no acute repolarization abnormalities, QTc 426 ms  Echocardiogram 09/08/2022    1. Left ventricular ejection fraction, by estimation, is 55 to 60%. Left  ventricular ejection fraction by PLAX is 57 %. The left ventricle has  normal function. The left ventricle has no regional wall motion  abnormalities. Left ventricular diastolic  parameters were normal.   2. Right ventricular systolic function is normal. The right ventricular  size is moderately enlarged. Tricuspid regurgitation signal is inadequate  for assessing PA pressure.   3. Right atrial size was moderately dilated.   4. The mitral valve is grossly normal. Trivial mitral valve  regurgitation.   5. The aortic valve  is tricuspid. Aortic valve regurgitation is not  visualized. No aortic stenosis is present.   6. The inferior vena cava is dilated in size with <50% respiratory  variability, suggesting right atrial pressure of 15 mmHg.   Nuclear stress  test 12/13/2020     Findings are consistent with prior myocardial infarction. The study is overall low risk.   No ST deviation was noted.   LV perfusion is abnormal. There is no evidence of ischemia. There is evidence of infarction. Defect 1: There is a medium defect with mild reduction in uptake present in the apical to basal inferior location(s) that is fixed. There is abnormal wall motion in the defect area. Consistent with infarction.   Left ventricular function is normal. Nuclear stress EF: 56 %. The left ventricular ejection fraction is normal (55-65%). End diastolic cavity size is normal.   Prior study available for comparison from 12/22/2017. No changes in perfusion on side by side comparison to prior study.   Lipid profile 12/13/2021 Cholesterol 171, HDL 42, LDL 81, triglycerides 294 94/74/7975 Hemoglobin A1c 7.5%  10/29/2023 Hemoglobin 17.1, creatinine 0.8, potassium 3.5, ALT 21  12/25/2023 Cholesterol 133, HDL 47, LDL 56, triglycerides 206 Hemoglobin A1c 8.1%  Risk Assessment/Calculations:          Physical Exam:   VS:  BP 117/74 (BP Location: Left Arm, Patient Position: Sitting, Cuff Size: Large)   Pulse 85   Resp 17   Ht 5' 7 (1.702 m)   Wt 242 lb (109.8 kg)   SpO2 92%   BMI 37.90 kg/m    Wt Readings from Last 3 Encounters:  02/05/24 242 lb (109.8 kg)  12/17/23 240 lb (108.9 kg)  10/28/23 (P) 240 lb (108.9 kg)    GEN: Well nourished, well developed in no acute distress NECK: No JVD; No carotid bruits CARDIAC: RRR, no murmurs, rubs, gallops RESPIRATORY:  Clear to auscultation without rales, wheezing or rhonchi  ABDOMEN: Soft, non-tender, non-distended EXTREMITIES:  No edema; No deformity   ASSESSMENT AND PLAN: .   CAD: He remains asymptomatic.  His previous angina was a sensation of severe retrosternal heaviness with activity.  He has not experienced that in years, including not at the time of his demand infarction during sepsis in May 2024.  Has a known  chronic total occlusion of the right coronary artery and could well have myocardium at risk in the distal left circumflex coronary artery distribution (80-90% stenosis, too small for PCI).  The focus is on risk factor mitigation.  On aspirin , beta-blocker, statin as well as long-acting nitrates. DM: Complicated by neuropathy and Charcot foot.  Glycemic control is suboptimal with a hemoglobin A1c that has actually increased to 8.1%.  He is on the maximum tolerated dose of Ozempic and on the maximum dose of Jardiance .  He did not tolerate Mounjaro due to diarrhea and has a history of side effects with metformin as well.  We discussed carbohydrate restriction and the concept of the glycemic index. Diabetic foot ulcer/osteomyelitis: Does not have detectable obstruction in the large arterial conduits, but appears to have significant disease in the small vessels of the feet.  He has undergone 2 separate toe amputations for osteomyelitis, 1 in each foot. HLP: LDL is now at target on combination atorvastatin  maximum dose and ezetimibe .  Triglycerides are minimally elevated and do not justify adding more lipid-lowering medications. HTN: Excellent control.  Continue same medications, but if he has any evidence of nephropathy or microalbuminuria, would replace to the triamterene  with  an angiotensin receptor blocker. OSA: He reports compliance with CPAP and denies any daytime hypersomnolence.  Conceivably, this could be part of the cause of his polycythemia, but he does not appear to have obesity hypoventilation syndrome       Patient Instructions  Medication Instructions:  No changes *If you need a refill on your cardiac medications before your next appointment, please call your pharmacy*  Lab Work: None ordered If you have labs (blood work) drawn today and your tests are completely normal, you will receive your results only by: MyChart Message (if you have MyChart) OR A paper copy in the mail If you have any  lab test that is abnormal or we need to change your treatment, we will call you to review the results.  Testing/Procedures: None ordered  Follow-Up: At [facility], you and your health needs are our priority.  As part of our continuing mission to provide you with exceptional heart care, our providers are all part of one team.  This team includes your primary Cardiologist (physician) and Advanced Practice Providers or APPs (Physician Assistants and Nurse Practitioners) who all work together to provide you with the care you need, when you need it.  Your next appointment:   1 year(s)  Provider:   Jerel Balding, MD    We recommend signing up for the patient portal called MyChart.  Sign up information is provided on this After Visit Summary.  MyChart is used to connect with patients for Virtual Visits (Telemedicine).  Patients are able to view lab/test results, encounter notes, upcoming appointments, etc.  Non-urgent messages can be sent to your provider as well.   To learn more about what you can do with MyChart, go to [email or web address].      Signed, Jerel Balding, MD

## 2024-02-05 NOTE — Patient Instructions (Signed)

## 2024-02-07 ENCOUNTER — Encounter: Payer: Self-pay | Admitting: Podiatry

## 2024-02-08 ENCOUNTER — Other Ambulatory Visit: Payer: Self-pay | Admitting: Podiatry

## 2024-02-08 MED ORDER — PREGABALIN 150 MG PO CAPS: 150.0000 mg | ORAL_CAPSULE | Freq: Two times a day (BID) | ORAL | 0 refills | Status: DC

## 2024-02-09 ENCOUNTER — Ambulatory Visit: Admitting: Podiatry

## 2024-02-09 ENCOUNTER — Encounter: Payer: Self-pay | Admitting: Podiatry

## 2024-02-09 DIAGNOSIS — M79674 Pain in right toe(s): Secondary | ICD-10-CM | POA: Diagnosis not present

## 2024-02-09 DIAGNOSIS — M79675 Pain in left toe(s): Secondary | ICD-10-CM | POA: Diagnosis not present

## 2024-02-09 DIAGNOSIS — L97412 Non-pressure chronic ulcer of right heel and midfoot with fat layer exposed: Secondary | ICD-10-CM

## 2024-02-09 DIAGNOSIS — B351 Tinea unguium: Secondary | ICD-10-CM

## 2024-02-09 DIAGNOSIS — E1140 Type 2 diabetes mellitus with diabetic neuropathy, unspecified: Secondary | ICD-10-CM | POA: Diagnosis not present

## 2024-02-09 NOTE — Progress Notes (Signed)
 Subjective: Chief Complaint  Patient presents with   Diabetic Ulcer    Rm12 F/u ulceration right foot/ patient says he is doing well.    68 year old male presents the office today for follow-up evaluation of right foot ulceration.  He still, keeping Betadine on the area daily.  Denies any drainage or pus or any open lesions.  He also needs his nails trimmed as they are thick and elongated they cause discomfort.   Objective: AAO x3, NAD DP/PT pulses palpable bilaterally, CRT less than 3 seconds Preulcerative lesion on the plantar medial aspect the patient's right foot along the area of prominence.  There is no open lesion identified at this time.  There is no edema, erythema or signs of infection. Nails are hypertrophic, dystrophic, brittle, discolored, elongated 10. No surrounding redness or drainage. Tenderness nails 1-5 bilaterally, except for second toe which has been amputated. No open lesions or pre-ulcerative lesions are identified today. No pain with calf compression, swelling, warmth, erythema  Assessment: Ulceration right foot with cellulitis-much improved  Plan: Preulcerative callus -Appears that the ulceration is healed.  Still in the surgical shoe with a peg assist.  Discussed with him gradual start to transition to his diabetic shoe with the insert.  He is to monitor closely for any signs or symptoms of infection and no reoccurrence.  Symptomatic onychomycosis -Debrided nails x 9 without complications or bleeding.  Return in about 4 weeks (around 03/08/2024) for pre-ulcerative callus right foot.  [name] DPM

## 2024-02-13 ENCOUNTER — Other Ambulatory Visit: Payer: Self-pay | Admitting: Podiatry

## 2024-02-16 ENCOUNTER — Ambulatory Visit: Admitting: Podiatry

## 2024-03-08 ENCOUNTER — Ambulatory Visit: Admitting: Podiatry

## 2024-03-08 ENCOUNTER — Encounter: Payer: Self-pay | Admitting: Podiatry

## 2024-03-08 DIAGNOSIS — E1140 Type 2 diabetes mellitus with diabetic neuropathy, unspecified: Secondary | ICD-10-CM

## 2024-03-08 DIAGNOSIS — L84 Corns and callosities: Secondary | ICD-10-CM | POA: Diagnosis not present

## 2024-03-08 DIAGNOSIS — M2142 Flat foot [pes planus] (acquired), left foot: Secondary | ICD-10-CM

## 2024-03-08 DIAGNOSIS — M2141 Flat foot [pes planus] (acquired), right foot: Secondary | ICD-10-CM | POA: Diagnosis not present

## 2024-03-09 NOTE — Progress Notes (Signed)
   Chief Complaint  Patient presents with   Callouses    pre-ulcerative callus bilateral medial/plantar arches of foot. 0 pain. NIDDM A1C 8.0      68 y.o. male presents the office today with above concerns, due to pre-ulcerative lesions to his feet. He has since gone back to wearing his shoes/inserts. He states the ulcers/calluses are doing better. He has not seen any recurrent open wounds. No drainage or purulence noted.   No other concerns today.   Charlott Dorn LABOR, MD Last seen: 01/23/24   Objective:   General: AAO x3, NAD  Dermatological: On plantar aspect the left and right right foot is a hyperkeratotic lesion without any underlying ulceration, drainage, or signs of infection. Nails are hypertrophic, dystrophic with yellow, brown discoloration to nails 1 through 5 on the right 1 through 5 on the left except for the second toe and right third which is partially amputated.  No edema, erythema or any open lesions.  Vascular: DP/PT pulses palpable.  There is no pain with calf compression, swelling, warmth, erythema.   Neruologic: Sensation decreased  Musculoskeletal: Significant flatfoot is present.  Hammertoes are present; proximal left third toe hammertoe previous partial second toe amputation  Gait: Unassisted, Nonantalgic.    Assessment:   Preulcerative callus; symptomatic onychomycosis  Plan:  Patient was evaluated and treated and all questions answered.   Right midfoot preulcerative lesion -Sharp debridement of hyperkeratotic lesion x 2 without any complications or bleeding.  No ulcerations. He can continue with the regular diabetic shoes/inserts to help offload. Given his significant flatfoot he needs to maintain arch support. Monitor for any clinical signs or symptoms of infection and directed to call the office immediately should any occur or go to the ER.  Symptomatic onychomycosis -Sharp debrided nails x 8 without any complications or bleeding as a  courtesy as they were minimally elongated.   Return in about 2 months (around 05/09/2024).  [name] DPM

## 2024-03-21 DIAGNOSIS — G629 Polyneuropathy, unspecified: Secondary | ICD-10-CM | POA: Diagnosis not present

## 2024-03-21 DIAGNOSIS — E1165 Type 2 diabetes mellitus with hyperglycemia: Secondary | ICD-10-CM | POA: Diagnosis not present

## 2024-03-21 DIAGNOSIS — G4733 Obstructive sleep apnea (adult) (pediatric): Secondary | ICD-10-CM | POA: Diagnosis not present

## 2024-03-21 DIAGNOSIS — E66812 Obesity, class 2: Secondary | ICD-10-CM | POA: Diagnosis not present

## 2024-03-21 DIAGNOSIS — Z6834 Body mass index (BMI) 34.0-34.9, adult: Secondary | ICD-10-CM | POA: Diagnosis not present

## 2024-03-21 DIAGNOSIS — I25119 Atherosclerotic heart disease of native coronary artery with unspecified angina pectoris: Secondary | ICD-10-CM | POA: Diagnosis not present

## 2024-03-21 DIAGNOSIS — I10 Essential (primary) hypertension: Secondary | ICD-10-CM | POA: Diagnosis not present

## 2024-03-22 ENCOUNTER — Other Ambulatory Visit (HOSPITAL_COMMUNITY): Payer: Self-pay

## 2024-03-22 ENCOUNTER — Other Ambulatory Visit: Payer: Self-pay | Admitting: Cardiovascular Disease

## 2024-03-29 ENCOUNTER — Encounter: Payer: Self-pay | Admitting: Podiatry

## 2024-03-29 ENCOUNTER — Ambulatory Visit: Admitting: Podiatry

## 2024-03-29 ENCOUNTER — Ambulatory Visit

## 2024-03-29 VITALS — BP 119/66 | Temp 97.5°F

## 2024-03-29 DIAGNOSIS — M2142 Flat foot [pes planus] (acquired), left foot: Secondary | ICD-10-CM | POA: Diagnosis not present

## 2024-03-29 DIAGNOSIS — M2141 Flat foot [pes planus] (acquired), right foot: Secondary | ICD-10-CM | POA: Diagnosis not present

## 2024-03-29 DIAGNOSIS — L84 Corns and callosities: Secondary | ICD-10-CM

## 2024-03-29 DIAGNOSIS — L03115 Cellulitis of right lower limb: Secondary | ICD-10-CM

## 2024-03-29 MED ORDER — SULFAMETHOXAZOLE-TRIMETHOPRIM 800-160 MG PO TABS: 1.0000 | ORAL_TABLET | Freq: Two times a day (BID) | ORAL | 0 refills | Status: DC

## 2024-03-29 NOTE — Progress Notes (Signed)
°  Chief Complaint  Patient presents with   Foot Pain    Right foot callus medial side midfoot plantar. Has had callus area for years. Noticed the redness last night before bed.  Diabetic A1c 8.0, 325 mg Asprin      68 y.o. male presents the office today with above concerns.  He states that last night he started noticing redness to the top and side of his foot but this morning it is not as red on the top of one of the side of the foot.  He does not see any drainage or pus he denies any systemic symptoms including any fevers or chills.  No recent treatment.  No injuries.  He states he tries to keep moisturizer on the calluses twice a day.    Objective:   Vitals:   03/29/24 1425  BP: 119/66  Temp: (!) 97.5 F (36.4 C)  SpO2: 96%    General: AAO x3, NAD  Dermatological: On plantar medial aspect the right foot is a hyperkeratotic lesion.  Upon debridement there is a preulcerative area noted.  Not able to express any areas of drainage or purulence.  No fluctuation or crepitation but is localized edema.  Erythema around this area to the medial aspect of the foot.  There is no ascending cellulitis.  Vascular: DP/PT pulses palpable.  There is no pain with calf compression, swelling, warmth, erythema.   Neruologic: Sensation decreased  Musculoskeletal: Significant flatfoot is present.  Hammertoes are present; proximal left third toe hammertoe previous partial second toe amputation  Gait: Unassisted, Nonantalgic.    Assessment:   Preulcerative lesion with cellulitis right foot  Plan:  -Treatment options discussed including all alternatives, risks, and complications -Etiology of symptoms were discussed - X-rays obtained reviewed.  Multiple views obtained.  There is no definitive evidence of acute fracture or cortical changes to suggest osteomyelitis.  There is no soft tissue emphysema.  Significant flatfoot is present with arthritic changes present throughout the foot. -Debrided  hyperkeratotic lesion without any complications or bleeding today.  There is no drainable collection noted or any drainage today. -Prescribed Bactrim  -Betadine dressing changes daily -Surgical shoe which he is wearing with the offloading pad -Discussed long-term with this becomes ongoing issue consider surgical intervention. -Monitor for any clinical signs or symptoms of infection and directed to call the office immediately should any occur or go to the ER.  No follow-ups on file.  [name] DPM

## 2024-03-30 ENCOUNTER — Emergency Department (HOSPITAL_BASED_OUTPATIENT_CLINIC_OR_DEPARTMENT_OTHER)
Admission: EM | Admit: 2024-03-30 | Discharge: 2024-03-30 | Disposition: A | Discharge status: Home / Self Care | Attending: Emergency Medicine | Admitting: Emergency Medicine

## 2024-03-30 ENCOUNTER — Encounter (HOSPITAL_BASED_OUTPATIENT_CLINIC_OR_DEPARTMENT_OTHER): Payer: Self-pay

## 2024-03-30 DIAGNOSIS — Z79899 Other long term (current) drug therapy: Secondary | ICD-10-CM | POA: Diagnosis not present

## 2024-03-30 DIAGNOSIS — Z7982 Long term (current) use of aspirin: Secondary | ICD-10-CM | POA: Diagnosis not present

## 2024-03-30 DIAGNOSIS — I10 Essential (primary) hypertension: Secondary | ICD-10-CM | POA: Diagnosis not present

## 2024-03-30 DIAGNOSIS — I2510 Atherosclerotic heart disease of native coronary artery without angina pectoris: Secondary | ICD-10-CM | POA: Insufficient documentation

## 2024-03-30 DIAGNOSIS — R0989 Other specified symptoms and signs involving the circulatory and respiratory systems: Secondary | ICD-10-CM | POA: Diagnosis present

## 2024-03-30 DIAGNOSIS — E119 Type 2 diabetes mellitus without complications: Secondary | ICD-10-CM | POA: Diagnosis not present

## 2024-03-30 DIAGNOSIS — L03115 Cellulitis of right lower limb: Secondary | ICD-10-CM | POA: Diagnosis not present

## 2024-03-30 LAB — COMPREHENSIVE METABOLIC PANEL WITH GFR
ALT: 22 U/L (ref 0–44)
AST: 29 U/L (ref 15–41)
Albumin: 4.1 g/dL (ref 3.5–5.0)
Alkaline Phosphatase: 70 U/L (ref 38–126)
Anion gap: 17 — ABNORMAL HIGH (ref 5–15)
BUN: 18 mg/dL (ref 8–23)
CO2: 21 mmol/L — ABNORMAL LOW (ref 22–32)
Calcium: 9.7 mg/dL (ref 8.9–10.3)
Chloride: 101 mmol/L (ref 98–111)
Creatinine, Ser: 1.07 mg/dL (ref 0.61–1.24)
GFR, Estimated: >60 mL/min (ref >60)
Glucose, Bld: 157 mg/dL — ABNORMAL HIGH (ref 70–99)
Potassium: 3.8 mmol/L (ref 3.5–5.1)
Sodium: 139 mmol/L (ref 135–145)
Total Bilirubin: 1.1 mg/dL (ref 0.0–1.2)
Total Protein: 7.2 g/dL (ref 6.5–8.1)

## 2024-03-30 LAB — CBC WITH DIFFERENTIAL/PLATELET
Abs Immature Granulocytes: 0.07 K/uL (ref 0.00–0.07)
Basophils Absolute: 0 K/uL (ref 0.0–0.1)
Basophils Relative: 0 %
Eosinophils Absolute: 0.1 K/uL (ref 0.0–0.5)
Eosinophils Relative: 1 %
HCT: 51.8 % (ref 39.0–52.0)
Hemoglobin: 17.6 g/dL — ABNORMAL HIGH (ref 13.0–17.0)
Immature Granulocytes: 1 %
Lymphocytes Relative: 1 %
Lymphs Abs: 0.1 K/uL — ABNORMAL LOW (ref 0.7–4.0)
MCH: 29.4 pg (ref 26.0–34.0)
MCHC: 34 g/dL (ref 30.0–36.0)
MCV: 86.5 fL (ref 80.0–100.0)
Monocytes Absolute: 0.2 K/uL (ref 0.1–1.0)
Monocytes Relative: 2 %
Neutro Abs: 10.7 K/uL — ABNORMAL HIGH (ref 1.7–7.7)
Neutrophils Relative %: 95 %
Platelets: 114 K/uL — ABNORMAL LOW (ref 150–400)
RBC: 5.99 MIL/uL — ABNORMAL HIGH (ref 4.22–5.81)
RDW: 14.2 % (ref 11.5–15.5)
WBC: 11.2 K/uL — ABNORMAL HIGH (ref 4.0–10.5)
nRBC: 0 % (ref 0.0–0.2)

## 2024-03-30 LAB — RESP PANEL BY RT-PCR (RSV, FLU A&B, COVID)  RVPGX2
Influenza A by PCR: NEGATIVE
Influenza B by PCR: NEGATIVE
Resp Syncytial Virus by PCR: NEGATIVE
SARS Coronavirus 2 by RT PCR: NEGATIVE

## 2024-03-30 MED ORDER — KETOROLAC TROMETHAMINE 15 MG/ML IJ SOLN
15.0000 mg | Freq: Once | INTRAMUSCULAR | Status: AC
  Administered 2024-03-30: 17:00:00 15 mg via INTRAMUSCULAR
  Filled 2024-03-30: qty 1

## 2024-03-30 NOTE — ED Triage Notes (Signed)
 Patient with hx of Charcot foot and infections comes in saying he is feeling worse than yesterday. He reports getting prescribed antibiotics yesterday for it.

## 2024-03-30 NOTE — ED Provider Notes (Signed)
 [place] EMERGENCY DEPARTMENT AT [facility] Provider Note   CSN: [id number] Arrival date & time: 03/30/24  1505     Patient presents with: Wound Infection   [name] is a 68 y.o. male past medical history of coronary artery disease, hypertension, hyperlipidemia, obesity, obstructive sleep apnea, peripheral neuropathy, type 2 diabetes, recurrent osteomyelitis secondary to diabetes presents to emergency room with complaint of feeling poorly.  Patient tells me he has been feeling sleepy, fatigued for 1 day.  He denies any temperature at home.  He has had some runny nose but no cough chest pain shortness of breath.  He did see his podiatrist yesterday who was concerned he had some early signs of infection on his right foot around a chronic callus and he was started on Bactrim .  He has taken 2 doses at this time.  He is not having pain over this area and he dose not think redness is getting worse. Has x-ray done yesterday in office with no sign of osteomyelitis.    HPI     Prior to Admission medications  Medication Sig Start Date End Date Taking? Authorizing Provider  acetaminophen  (TYLENOL ) 650 MG CR tablet Take 1,300 mg by mouth at bedtime.    [provider]  aspirin  EC 325 MG tablet Take 325 mg by mouth at bedtime.    [provider]  atorvastatin  (LIPITOR ) 80 MG tablet Take 80 mg by mouth at bedtime.  02/09/13   [provider]  celecoxib  (CELEBREX ) 200 MG capsule TAKE 1 CAPSULE BY MOUTH EVERY DAY 02/15/24   Gershon Donnice SAUNDERS, DPM  Cholecalciferol (VITAMIN D-3 PO) Take 1 capsule by mouth daily.    [provider]  colesevelam  (WELCHOL ) 625 MG tablet Take 1,875 mg by mouth at bedtime.    [provider]  empagliflozin  (JARDIANCE ) 25 MG TABS tablet TAKE 1 TABLET (25 MG TOTAL) BY MOUTH DAILY. 03/24/24   Croitoru, Mihai, MD  ezetimibe  (ZETIA ) 10 MG tablet TAKE 1 TABLET BY MOUTH EVERY DAY 03/24/24   Croitoru, Mihai, MD   isosorbide  mononitrate (IMDUR ) 120 MG 24 hr tablet TAKE 1 TABLET BY MOUTH EVERY DAY 03/24/24   Croitoru, Mihai, MD  metoprolol  succinate (TOPROL -XL) 100 MG 24 hr tablet TAKE 1 TABLET BY MOUTH EVERY DAY WITH OR IMMEDIATELY FOLLOWING A MEAL 03/24/24   Croitoru, Mihai, MD  Multiple Vitamins-Minerals (MULTIVITAMIN MEN 50+) TABS Take 1 tablet by mouth daily.    [provider]  nitroGLYCERIN  (NITROSTAT ) 0.4 MG SL tablet Place 1 tablet (0.4 mg total) under the tongue every 5 (five) minutes x 3 doses as needed for chest pain. 11/24/22   Croitoru, Mihai, MD  omeprazole (PRILOSEC) 20 MG capsule Take 20 mg by mouth at bedtime. 02/03/13   [provider]  OZEMPIC, 0.25 OR 0.5 MG/DOSE, 2 MG/3ML SOPN 0.25 mg Subcutaneous weekly; Duration: 30 days 11/30/23   [provider]  Polyethyl Glycol-Propyl Glycol (SYSTANE OP) Place 1 drop into both eyes 2 (two) times daily.    [provider]  pregabalin  (LYRICA ) 150 MG capsule Take 1 capsule (150 mg total) by mouth 2 (two) times daily. 02/08/24   Gershon Donnice SAUNDERS, DPM  senna-docusate (SENOKOT S) 8.6-50 MG tablet Take 1 tablet by mouth at bedtime as needed. 01/29/18   Jerri Kay HERO, MD  sodium chloride  (OCEAN) 0.65 % SOLN nasal spray Place 1-2 sprays into both nostrils every 4 (four) hours as needed (sinus irritation).    [provider]  sulfamethoxazole -trimethoprim  (BACTRIM   DS) 800-160 MG tablet Take 1 tablet by mouth 2 (two) times daily. 03/29/24   Gershon Donnice SAUNDERS, DPM  triamterene -hydrochlorothiazide  (MAXZIDE ) 75-50 MG per tablet Take 0.5 tablets by mouth daily.  01/06/13   [provider]  UNABLE TO FIND Inhale 1 Device into the lungs at bedtime. CPAP    [provider]    Allergies: Glucophage [metformin], Mounjaro [tirzepatide], Ozempic (0.25 or 0.5 mg-dose) [semaglutide(0.25 or 0.5mg -dos)], and Penicillins    Review of Systems  Skin:  Positive for wound.    Updated Vital Signs BP 114/64 (BP  Location: Right Arm)   Pulse 92   Temp 97.9 F (36.6 C) (Oral)   Resp 18   SpO2 92%   Physical Exam Vitals and nursing note reviewed.  Constitutional:      General: He is not in acute distress.    Appearance: He is not toxic-appearing.  HENT:     Head: Normocephalic and atraumatic.  Eyes:     General: No scleral icterus.    Conjunctiva/sclera: Conjunctivae normal.  Cardiovascular:     Rate and Rhythm: Normal rate and regular rhythm.     Pulses: Normal pulses.     Heart sounds: Normal heart sounds.  Pulmonary:     Effort: Pulmonary effort is normal. No respiratory distress.     Breath sounds: Normal breath sounds.  Abdominal:     General: Abdomen is flat. Bowel sounds are normal.     Palpations: Abdomen is soft.     Tenderness: There is no abdominal tenderness.  Musculoskeletal:     Right lower leg: No edema.     Left lower leg: No edema.  Skin:    General: Skin is warm and dry.     Findings: No lesion.     Comments: Patient has a callus over the medial aspect of his right foot with very small amount of surrounding erythema no abscess or streaking cellulitis.  Neurovascularly intact.  Neurological:     General: No focal deficit present.     Mental Status: He is alert and oriented to person, place, and time. Mental status is at baseline.     (all labs ordered are listed, but only abnormal results are displayed) Labs Reviewed  CBC WITH DIFFERENTIAL/PLATELET - Abnormal; Notable for the following components:      Result Value   WBC 11.2 (*)    RBC 5.99 (*)    Hemoglobin 17.6 (*)    Platelets 114 (*)    Neutro Abs 10.7 (*)    Lymphs Abs 0.1 (*)    All other components within normal limits  COMPREHENSIVE METABOLIC PANEL WITH GFR - Abnormal; Notable for the following components:   CO2 21 (*)    Glucose, Bld 157 (*)    Anion gap 17 (*)    All other components within normal limits  RESP PANEL BY RT-PCR (RSV, FLU A&B, COVID)  [id number]    EKG: None  Radiology: No  results found.   Procedures   Medications Ordered in the ED  ketorolac  (TORADOL ) 15 MG/ML injection 15 mg (has no administration in time range)                                    Medical Decision Making Amount and/or Complexity of Data Reviewed Labs: ordered.  Risk Prescription drug management.   This patient presents to the ED for concern of foot pain, this involves  an extensive number of treatment options, and is a complaint that carries with it a high risk of complications and morbidity.  The differential diagnosis includes abscess, cellulitis, osteomyelitis, deep space infection   Co morbidities that complicate the patient evaluation  History of osteomyelitis, diabetes   Additional history obtained:  Additional history obtained from recent office visit with podiatry 03/29/2024   Lab Tests:  I personally interpreted labs.  The pertinent results include:   Mild leukocytosis at 11.2 otherwise unremarkable labs   Imaging Studies ordered:  Considered however patient had an x-ray yesterday that was unremarkable.   Cardiac Monitoring: / EKG:  The patient was maintained on a cardiac monitor.     Problem List / ED Course / Critical interventions / Medication management  Reports emergency room with complaint of generalized weakness/fatigue ongoing for 1 day.  He also endorses that he was recently diagnosed with right foot cellulitis by podiatry.  Podiatry saw him yesterday and had x-ray and started him antibiotic.  He had no evidence of osteomyelitis.  On arrival his vitals are stable.  He has mild leukocytosis but not obviously septic.  Given he has had x-ray less than 24 hours ago I do not think we need to repeat today.  On exam this does appear to be a superficial cellulitis.  Suspicion for osteomyelitis, abscess or septic joint. No sign of DVT. Neurovascularly intact.  I ordered medication including Toradol   Reevaluation of the patient after these medicines showed  that the patient improved I have reviewed the patients home medicines and have made adjustments as needed. Patient's vital signs have been stable throughout stay.  Cellulitis marked with a pen here. He is afebrile without any indication that he has systemic illness.  I have a low suspicion for osteomyelitis or deep space infection at this time.  I feel continued oral antibiotics is most appropriate with close podiatry follow-up.  He was given return precautions.        Final diagnoses:  Cellulitis of right foot    ED Discharge Orders     None          Shermon Warren SAILOR, PA-C 03/30/24 1737    Charlyn Sora, MD 03/30/24 [phone]

## 2024-03-30 NOTE — ED Notes (Signed)
 DC paperwork given and verbally understood.

## 2024-03-30 NOTE — Discharge Instructions (Signed)
 The cellulitis marked with the pen.  If infection starts to spread outside of the area that is marked please return to emergency room, please continue your current treatment and follow-up with your podiatrist next week.

## 2024-03-31 ENCOUNTER — Inpatient Hospital Stay

## 2024-03-31 ENCOUNTER — Inpatient Hospital Stay: Admitting: Internal Medicine

## 2024-04-01 ENCOUNTER — Emergency Department (HOSPITAL_COMMUNITY)

## 2024-04-01 ENCOUNTER — Other Ambulatory Visit: Payer: Self-pay

## 2024-04-01 ENCOUNTER — Encounter (HOSPITAL_COMMUNITY): Payer: Self-pay

## 2024-04-01 ENCOUNTER — Emergency Department (HOSPITAL_COMMUNITY)
Admission: EM | Admit: 2024-04-01 | Discharge: 2024-04-01 | Disposition: A | Discharge status: Home / Self Care | Attending: Emergency Medicine | Admitting: Emergency Medicine

## 2024-04-01 ENCOUNTER — Encounter: Payer: Self-pay | Admitting: Podiatry

## 2024-04-01 DIAGNOSIS — I10 Essential (primary) hypertension: Secondary | ICD-10-CM | POA: Diagnosis not present

## 2024-04-01 DIAGNOSIS — R39198 Other difficulties with micturition: Secondary | ICD-10-CM | POA: Diagnosis not present

## 2024-04-01 DIAGNOSIS — R509 Fever, unspecified: Secondary | ICD-10-CM | POA: Diagnosis not present

## 2024-04-01 DIAGNOSIS — Z794 Long term (current) use of insulin: Secondary | ICD-10-CM | POA: Diagnosis not present

## 2024-04-01 DIAGNOSIS — E1142 Type 2 diabetes mellitus with diabetic polyneuropathy: Secondary | ICD-10-CM | POA: Diagnosis not present

## 2024-04-01 DIAGNOSIS — Z7982 Long term (current) use of aspirin: Secondary | ICD-10-CM | POA: Insufficient documentation

## 2024-04-01 DIAGNOSIS — Z79899 Other long term (current) drug therapy: Secondary | ICD-10-CM | POA: Insufficient documentation

## 2024-04-01 DIAGNOSIS — Z4800 Encounter for change or removal of nonsurgical wound dressing: Secondary | ICD-10-CM | POA: Insufficient documentation

## 2024-04-01 DIAGNOSIS — I2510 Atherosclerotic heart disease of native coronary artery without angina pectoris: Secondary | ICD-10-CM | POA: Insufficient documentation

## 2024-04-01 DIAGNOSIS — Z5189 Encounter for other specified aftercare: Secondary | ICD-10-CM

## 2024-04-01 DIAGNOSIS — M79671 Pain in right foot: Secondary | ICD-10-CM | POA: Insufficient documentation

## 2024-04-01 DIAGNOSIS — Z7984 Long term (current) use of oral hypoglycemic drugs: Secondary | ICD-10-CM | POA: Diagnosis not present

## 2024-04-01 LAB — URINALYSIS, ROUTINE W REFLEX MICROSCOPIC
Bacteria, UA: NONE SEEN
Bilirubin Urine: NEGATIVE
Glucose, UA: >=500 mg/dL — AB
Hgb urine dipstick: NEGATIVE
Ketones, ur: 5 mg/dL — AB
Leukocytes,Ua: NEGATIVE
Nitrite: NEGATIVE
Protein, ur: 100 mg/dL — AB
Specific Gravity, Urine: 1.028 (ref 1.005–1.030)
pH: 5 (ref 5.0–8.0)

## 2024-04-01 LAB — CBG MONITORING, ED: Glucose-Capillary: 155 mg/dL — ABNORMAL HIGH (ref 70–99)

## 2024-04-01 NOTE — ED Triage Notes (Addendum)
 Patient BIB [facility] from home. Noticed a wound on his right inner foot on Monday. Is taking antibiotics for it. Marked the wound and told patient to go back to ED if the redness is spreading. Patient feels that his pain is worsening. Wound is draining orange in color.

## 2024-04-01 NOTE — ED Provider Notes (Cosign Needed)
 " [place] EMERGENCY DEPARTMENT AT [facility] Provider Note   CSN: [id number] Arrival date & time: 04/01/24  1657     Patient presents with: Wound Check   [name] is a 68 y.o. male.   The history is provided by the patient, the spouse and medical records. No language interpreter was used.  Wound Check     68 year old male with history of diabetes, peripheral neuropathy, recurrent osteomyelitis, CAD, hypertension, obesity, presenting for a wound recheck.  Patient states that he was seen by his podiatrist several days ago and was noted to have some early signs of infection involving his right foot and he was placed on Bactrim .  He mention the next day he felt not well and he went to the ER to be evaluated.  States that he had blood test as well as having COVID and flu test which came back negative.  He was subsequently discharged and continue to take his antibiotic.  He mention he still does not feel back to normal and would like to be evaluated.  Furthermore he also noticed that his urine is dark with some mild urinary discomfort for the past several days.  He denies increasing pain to his foot.  He does endorse some subjective fever and chills.  No chest pain or shortness of breath no productive cough.  No significant headache runny nose sneezing or sore throat.  Prior to Admission medications  Medication Sig Start Date End Date Taking? Authorizing Provider  acetaminophen  (TYLENOL ) 650 MG CR tablet Take 1,300 mg by mouth at bedtime.    [provider]  aspirin  EC 325 MG tablet Take 325 mg by mouth at bedtime.    [provider]  atorvastatin  (LIPITOR ) 80 MG tablet Take 80 mg by mouth at bedtime.  02/09/13   [provider]  celecoxib  (CELEBREX ) 200 MG capsule TAKE 1 CAPSULE BY MOUTH EVERY DAY 02/15/24   Gershon Donnice SAUNDERS, DPM  Cholecalciferol (VITAMIN D-3 PO) Take 1 capsule by mouth daily.    [provider]  colesevelam   (WELCHOL ) 625 MG tablet Take 1,875 mg by mouth at bedtime.    [provider]  empagliflozin  (JARDIANCE ) 25 MG TABS tablet TAKE 1 TABLET (25 MG TOTAL) BY MOUTH DAILY. 03/24/24   Croitoru, Mihai, MD  ezetimibe  (ZETIA ) 10 MG tablet TAKE 1 TABLET BY MOUTH EVERY DAY 03/24/24   Croitoru, Mihai, MD  isosorbide  mononitrate (IMDUR ) 120 MG 24 hr tablet TAKE 1 TABLET BY MOUTH EVERY DAY 03/24/24   Croitoru, Mihai, MD  metoprolol  succinate (TOPROL -XL) 100 MG 24 hr tablet TAKE 1 TABLET BY MOUTH EVERY DAY WITH OR IMMEDIATELY FOLLOWING A MEAL 03/24/24   Croitoru, Mihai, MD  Multiple Vitamins-Minerals (MULTIVITAMIN MEN 50+) TABS Take 1 tablet by mouth daily.    [provider]  nitroGLYCERIN  (NITROSTAT ) 0.4 MG SL tablet Place 1 tablet (0.4 mg total) under the tongue every 5 (five) minutes x 3 doses as needed for chest pain. 11/24/22   Croitoru, Mihai, MD  omeprazole (PRILOSEC) 20 MG capsule Take 20 mg by mouth at bedtime. 02/03/13   [provider]  OZEMPIC, 0.25 OR 0.5 MG/DOSE, 2 MG/3ML SOPN 0.25 mg Subcutaneous weekly; Duration: 30 days 11/30/23   [provider]  Polyethyl Glycol-Propyl Glycol (SYSTANE OP) Place 1 drop into both eyes 2 (two) times daily.    [provider]  pregabalin  (LYRICA ) 150 MG capsule Take 1 capsule (150 mg total) by mouth 2 (two) times daily. 02/08/24  Gershon Donnice SAUNDERS, DPM  senna-docusate (SENOKOT S) 8.6-50 MG tablet Take 1 tablet by mouth at bedtime as needed. 01/29/18   Jerri Kay HERO, MD  sodium chloride  (OCEAN) 0.65 % SOLN nasal spray Place 1-2 sprays into both nostrils every 4 (four) hours as needed (sinus irritation).    [provider]  sulfamethoxazole -trimethoprim  (BACTRIM  DS) 800-160 MG tablet Take 1 tablet by mouth 2 (two) times daily. 03/29/24   Gershon Donnice SAUNDERS, DPM  triamterene -hydrochlorothiazide  (MAXZIDE ) 75-50 MG per tablet Take 0.5 tablets by mouth daily.  01/06/13   [provider]  UNABLE TO FIND Inhale 1  Device into the lungs at bedtime. CPAP    [provider]    Allergies: Glucophage [metformin], Mounjaro [tirzepatide], Ozempic (0.25 or 0.5 mg-dose) [semaglutide(0.25 or 0.5mg -dos)], and Penicillins    Review of Systems  All other systems reviewed and are negative.   Updated Vital Signs BP 118/79 (BP Location: Left Arm)   Pulse 87   Temp 99.2 F (37.3 C) (Oral)   Resp 16   Ht 5' 8 (1.727 m)   Wt (!) 147.4 kg   SpO2 92%   BMI 49.42 kg/m   Physical Exam Constitutional:      General: He is not in acute distress.    Appearance: He is well-developed.  HENT:     Head: Atraumatic.  Eyes:     Conjunctiva/sclera: Conjunctivae normal.  Cardiovascular:     Rate and Rhythm: Normal rate and regular rhythm.     Pulses: Normal pulses.     Heart sounds: Normal heart sounds.  Pulmonary:     Effort: Pulmonary effort is normal.     Breath sounds: Normal breath sounds.  Abdominal:     Palpations: Abdomen is soft.     Tenderness: There is no abdominal tenderness.  Musculoskeletal:     Cervical back: Normal range of motion and neck supple. No rigidity.  Skin:    Findings: No rash.     Comments: Right foot: wound dressing overlying the mid medial sole of the foot.  Small wound noted there with a small scab but no surrounding erythema no significant tenderness and no significant erythema that spread beyond the skin drawn margin.  Neurological:     Mental Status: He is alert.     (all labs ordered are listed, but only abnormal results are displayed) Labs Reviewed  URINALYSIS, ROUTINE W REFLEX MICROSCOPIC - Abnormal; Notable for the following components:      Result Value   Color, Urine AMBER (*)    Glucose, UA >=500 (*)    Ketones, ur 5 (*)    Protein, ur 100 (*)    All other components within normal limits    EKG: None  Radiology: DG Foot Complete Right Result Date: 04/01/2024 EXAM: 3 or more VIEW(S) XRAY OF THE FOOT 04/01/2024 06:16:59 PM COMPARISON: 10/28/2023  CLINICAL HISTORY: foot pain FINDINGS: BONES AND JOINTS: Status post third digit amputation. Pes planus. Plantar calcaneal spur. Stable midfoot degenerative change with findings suggestive of Charcot arthropathy. No acute fracture. No malalignment. SOFT TISSUES: Vascular calcifications. The soft tissues are unremarkable. IMPRESSION: 1. No acute findings. 2. Stable midfoot degenerative change with findings suggestive of Charcot arthropathy. 3. Status post third digit amputation. 4. Pes planus. Electronically signed by: Greig Pique MD 04/01/2024 06:55 PM EST RP Workstation: [id number]   DG Foot Complete Right Result Date: 04/01/2024 Please see detailed radiograph report in office note.    Procedures   Medications Ordered in the  ED - No data to display                                  Medical Decision Making Amount and/or Complexity of Data Reviewed Labs: ordered. Radiology: ordered.   BP 118/79 (BP Location: Left Arm)   Pulse 87   Temp 99.2 F (37.3 C) (Oral)   Resp 16   Ht 5' 8 (1.727 m)   Wt (!) 147.4 kg   SpO2 92%   BMI 49.42 kg/m   31:47 PM 68 year old male with history of diabetes, peripheral neuropathy, recurrent osteomyelitis, CAD, hypertension, obesity, presenting for a wound recheck.  Patient states that he was seen by his podiatrist several days ago and was noted to have some early signs of infection involving his right foot and he was placed on Bactrim .  He mention the next day he felt not well and he went to the ER to be evaluated.  States that he had blood test as well as having COVID and flu test which came back negative.  He was subsequently discharged and continue to take his antibiotic.  He mention he still does not feel back to normal and would like to be evaluated.  Furthermore he also noticed that his urine is dark with some mild urinary discomfort for the past several days.  He denies increasing pain to his foot.  He does endorse some subjective fever and chills.   No chest pain or shortness of breath no productive cough.  No significant headache runny nose sneezing or sore throat.  On exam patient is well-appearing in no acute discomfort.  Heart with normal rate rhythm, lungs are clear abdomen is soft nontender wound on his right foot at the sole without worsening infection.  There is no erythema that spread beyond the margin that was drawn.  No abscess noted.  He is neurovascular intact.  X-ray of the right foot without any acute finding.  Does not think patient would need MRI of his foot at this time as I have low suspicion for osteomyelitis.  Since patient endorsed having some urinary discomfort and dark urine, will check UA.  ER review patient was seen several days ago for similar presentation and at that time a respiratory panel was negative.  Do not think additional blood work is indicated at this time as patient's vital signs are overall reassuring.  Urinalysis obtained showing no evidence of UTI.  CBG is 155.  At this time I felt patient stable for discharge.  Patient does have an appointment with his podiatrist in 3 days.  He will finish with his antibiotic and will follow-up outpatient appropriately.  Return precaution given.     Final diagnoses:  Visit for wound check    ED Discharge Orders     None          Nivia Colon, PA-C 04/01/24 2155  "

## 2024-04-01 NOTE — Discharge Instructions (Signed)
 Please continue taking antibiotic previously prescribed as treatment for your foot infection.  You should follow-up closely with your podiatrist next week for outpatient care.  Your urine today did not show any signs of urinary tract infection.

## 2024-04-01 NOTE — ED Notes (Signed)
 Pt educated on DC instructions and verbalizes understanding, pt has wheelchair, pt making a phone call per request and then will be taken to the lobby

## 2024-04-05 ENCOUNTER — Ambulatory Visit: Admitting: Podiatry

## 2024-04-05 VITALS — Ht 68.0 in | Wt 325.0 lb

## 2024-04-05 DIAGNOSIS — M2142 Flat foot [pes planus] (acquired), left foot: Secondary | ICD-10-CM

## 2024-04-05 DIAGNOSIS — L97411 Non-pressure chronic ulcer of right heel and midfoot limited to breakdown of skin: Secondary | ICD-10-CM

## 2024-04-05 DIAGNOSIS — M2141 Flat foot [pes planus] (acquired), right foot: Secondary | ICD-10-CM | POA: Diagnosis not present

## 2024-04-05 DIAGNOSIS — L03115 Cellulitis of right lower limb: Secondary | ICD-10-CM | POA: Diagnosis not present

## 2024-04-05 MED ORDER — DOXYCYCLINE HYCLATE 100 MG PO TABS: 100.0000 mg | ORAL_TABLET | Freq: Two times a day (BID) | ORAL | 0 refills | Status: AC

## 2024-04-05 NOTE — Progress Notes (Signed)
"  °  Chief Complaint  Patient presents with   Wound Check    RM 13 Patient is here for ulcer of the right foot. Ulcer is scabbed over with no visible drainage.       68 y.o. male presents the office today with above concerns.  Over the weekend he is coming emergency room twice and has been discharged.  He states that he has been starting to feel better.  His urine has been warm and she states he has a raspy voice.  He states he was checked for COVID as well as respiratory illnesses which were negative.  He is not sure if the antibiotic is causing the issues.  Currently denies any fevers or chills and overall starting to feel better.     Objective:   There were no vitals filed for this visit.    General: AAO x3, NAD  Dermatological: On plantar medial aspect the right foot is a hyperkeratotic lesion.  Upon debridement superficial area of skin breakdown minimal bleeding occurred during the debridement.  There is no probing, undermining or tunneling.  Today there is no surrounding erythema, ascending cellulitis.  Is no fluctuation or crepitation.  There is no malodor.  There is no increase in temperature to the foot.  Appears the infection has resolved.   Vascular: DP/PT pulses palpable.  There is no pain with calf compression, swelling, warmth, erythema.   Neruologic: Sensation decreased  Musculoskeletal: Significant flatfoot is present.  Hammertoes are present; proximal left third toe hammertoe previous partial second toe amputation  Gait: Unassisted, Nonantalgic.    Assessment:   Preulcerative lesion with cellulitis right foot  Plan:  -Treatment options discussed including all alternatives, risks, and complications -Etiology of symptoms were discussed -Strep debrided hyperkeratotic lesion on the area of the wound on the plantar medial aspect of right foot with minimal bleeding.  Clean the area and hemostasis achieved.  Dressing was applied.  Discussed with them daily dressing  changes. -Clinically looks like the infection has resolved and given his symptoms were to hold off on further antibiotics.  Given that it is a holiday weekend I prescribe doxycycline  for him to have in case he needs it but otherwise from a foot standpoint at this time I think we can hold off on antibiotics.  I have encouraged him to follow-up with his PCP as well given his other symptoms. -Monitor for any clinical signs or symptoms of infection and directed to call the office immediately should any occur or go to the ER.  Return in about 2 weeks (around 04/19/2024).  [name] DPM "

## 2024-04-19 ENCOUNTER — Ambulatory Visit (INDEPENDENT_AMBULATORY_CARE_PROVIDER_SITE_OTHER): Admitting: Podiatry

## 2024-04-19 ENCOUNTER — Other Ambulatory Visit: Payer: Self-pay | Admitting: Podiatry

## 2024-04-19 DIAGNOSIS — M79674 Pain in right toe(s): Secondary | ICD-10-CM | POA: Diagnosis not present

## 2024-04-19 DIAGNOSIS — B351 Tinea unguium: Secondary | ICD-10-CM | POA: Diagnosis not present

## 2024-04-19 DIAGNOSIS — M2141 Flat foot [pes planus] (acquired), right foot: Secondary | ICD-10-CM | POA: Diagnosis not present

## 2024-04-19 DIAGNOSIS — L03115 Cellulitis of right lower limb: Secondary | ICD-10-CM

## 2024-04-19 DIAGNOSIS — L84 Corns and callosities: Secondary | ICD-10-CM | POA: Diagnosis not present

## 2024-04-19 DIAGNOSIS — M79675 Pain in left toe(s): Secondary | ICD-10-CM | POA: Diagnosis not present

## 2024-04-19 DIAGNOSIS — E1140 Type 2 diabetes mellitus with diabetic neuropathy, unspecified: Secondary | ICD-10-CM

## 2024-04-19 DIAGNOSIS — M2142 Flat foot [pes planus] (acquired), left foot: Secondary | ICD-10-CM

## 2024-04-20 ENCOUNTER — Other Ambulatory Visit: Payer: Self-pay | Admitting: Medical Oncology

## 2024-04-20 DIAGNOSIS — D751 Secondary polycythemia: Secondary | ICD-10-CM

## 2024-04-20 NOTE — Progress Notes (Signed)
"  ° °  Chief Complaint  Patient presents with   Foot Ulcer    F/U Pre ulcerative lesion bilateral medial/plantar arch. 0 pain/drainage. NIDDM A1C 8.0     69 y.o. male presents the office today with above concerns.  Overall states he is feeling much better.  He does keep moisturizer on the areas of the calluses to the plantar aspect of the feet he is not report any increase in swelling, redness or any drainage or pus.    Nails are also thick and discolored and then wrapped causing discomfort.   Objective:    General: AAO x3, NAD  Dermatological: On plantar medial aspect the right foot > left is a hyperkeratotic lesion.  There are some dried blood present at the callus on the right foot but upon debridement there is no underlying ulceration, drainage or signs of infection.  No other open lesions identified. Nails are hypertrophic, dystrophic, brittle, discolored, elongated 8. No surrounding redness or drainage. Tenderness nails 1-5 bilaterally, except the toes have been amputated partially.   Vascular: DP/PT pulses palpable.  There is no pain with calf compression, swelling, warmth, erythema.   Neruologic: Sensation decreased  Musculoskeletal: Significant flatfoot is present.  Hammertoes are present; proximal left third toe hammertoe previous partial second toe amputation  Gait: Unassisted, Nonantalgic.    Assessment:   Preulcerative lesion with cellulitis right foot  Plan:  Preulcerative calluses with resolved cellulitis -Currently there is no signs of infection we will hold further antibiotics.  As a courtesy I debrided the hyperkeratotic tissue without any complications or bleeding today.  Continue his Darco shoe for offloading the right side to help facilitate soft tissue healing.  Monitor closely for any signs or symptoms of infection and or reoccurrence of the ulceration.  Symptomatic onychomycosis -Sharply debrided nails x 8 without complications or bleeding  Return for  foot ulcer in 2-3 weeks.  [name] DPM  "

## 2024-04-21 ENCOUNTER — Inpatient Hospital Stay: Admitting: Internal Medicine

## 2024-04-21 ENCOUNTER — Inpatient Hospital Stay

## 2024-04-21 ENCOUNTER — Other Ambulatory Visit: Payer: Self-pay | Admitting: *Deleted

## 2024-04-21 ENCOUNTER — Inpatient Hospital Stay: Attending: Internal Medicine

## 2024-04-21 VITALS — BP 128/82 | HR 64 | Temp 97.7°F | Resp 17 | Ht 68.0 in | Wt 230.0 lb

## 2024-04-21 DIAGNOSIS — E11628 Type 2 diabetes mellitus with other skin complications: Secondary | ICD-10-CM | POA: Diagnosis not present

## 2024-04-21 DIAGNOSIS — D696 Thrombocytopenia, unspecified: Secondary | ICD-10-CM | POA: Insufficient documentation

## 2024-04-21 DIAGNOSIS — Z89421 Acquired absence of other right toe(s): Secondary | ICD-10-CM

## 2024-04-21 DIAGNOSIS — R718 Other abnormality of red blood cells: Secondary | ICD-10-CM | POA: Insufficient documentation

## 2024-04-21 DIAGNOSIS — G4733 Obstructive sleep apnea (adult) (pediatric): Secondary | ICD-10-CM | POA: Diagnosis not present

## 2024-04-21 DIAGNOSIS — D45 Polycythemia vera: Secondary | ICD-10-CM

## 2024-04-21 DIAGNOSIS — D751 Secondary polycythemia: Secondary | ICD-10-CM

## 2024-04-21 DIAGNOSIS — Z79899 Other long term (current) drug therapy: Secondary | ICD-10-CM | POA: Insufficient documentation

## 2024-04-21 LAB — IRON AND IRON BINDING CAPACITY (CC-WL,HP ONLY)
Iron: 121 ug/dL (ref 45–182)
Saturation Ratios: 32 % (ref 17.9–39.5)
TIBC: 379 ug/dL (ref 250–450)
UIBC: 258 ug/dL

## 2024-04-21 LAB — CMP (CANCER CENTER ONLY)
ALT: 28 U/L (ref 0–44)
AST: 24 U/L (ref 15–41)
Albumin: 4.1 g/dL (ref 3.5–5.0)
Alkaline Phosphatase: 101 U/L (ref 38–126)
Anion gap: 11 (ref 5–15)
BUN: 15 mg/dL (ref 8–23)
CO2: 25 mmol/L (ref 22–32)
Calcium: 9.3 mg/dL (ref 8.9–10.3)
Chloride: 100 mmol/L (ref 98–111)
Creatinine: 0.91 mg/dL (ref 0.61–1.24)
GFR, Estimated: >60 mL/min
Glucose, Bld: 198 mg/dL — ABNORMAL HIGH (ref 70–99)
Potassium: 4.3 mmol/L (ref 3.5–5.1)
Sodium: 136 mmol/L (ref 135–145)
Total Bilirubin: 0.8 mg/dL (ref 0.0–1.2)
Total Protein: 7.1 g/dL (ref 6.5–8.1)

## 2024-04-21 LAB — CBC WITH DIFFERENTIAL (CANCER CENTER ONLY)
Abs Immature Granulocytes: 0.02 K/uL (ref 0.00–0.07)
Basophils Absolute: 0.1 K/uL (ref 0.0–0.1)
Basophils Relative: 2 %
Eosinophils Absolute: 0.3 K/uL (ref 0.0–0.5)
Eosinophils Relative: 6 %
HCT: 50.9 % (ref 39.0–52.0)
Hemoglobin: 17.1 g/dL — ABNORMAL HIGH (ref 13.0–17.0)
Immature Granulocytes: 0 %
Lymphocytes Relative: 34 %
Lymphs Abs: 1.6 K/uL (ref 0.7–4.0)
MCH: 29.3 pg (ref 26.0–34.0)
MCHC: 33.6 g/dL (ref 30.0–36.0)
MCV: 87.2 fL (ref 80.0–100.0)
Monocytes Absolute: 0.4 K/uL (ref 0.1–1.0)
Monocytes Relative: 9 %
Neutro Abs: 2.2 K/uL (ref 1.7–7.7)
Neutrophils Relative %: 49 %
Platelet Count: 124 K/uL — ABNORMAL LOW (ref 150–400)
RBC: 5.84 MIL/uL — ABNORMAL HIGH (ref 4.22–5.81)
RDW: 14.6 % (ref 11.5–15.5)
Smear Review: NORMAL
WBC Count: 4.6 K/uL (ref 4.0–10.5)
nRBC: 0 % (ref 0.0–0.2)

## 2024-04-21 LAB — FERRITIN: Ferritin: 124 ng/mL (ref 24–336)

## 2024-04-21 LAB — LACTATE DEHYDROGENASE: LDH: 116 U/L (ref 105–235)

## 2024-04-21 NOTE — Progress Notes (Signed)
 "   [facility] Telephone:([phone]) [phone]   Fax:([phone]) [phone]  CONSULT NOTE  REFERRING PHYSICIAN: Dr. Dorn Sauers  REASON FOR CONSULTATION:  69 years old white male with polycythemia  HPI [name] is a 69 y.o. male.   HPI  Discussed the use of AI scribe software for clinical note transcription with the patient, who gave verbal consent to proceed.  History of Present Illness [name] is a 69 year old male with persistent erythrocytosis and chronic mild thrombocytopenia who presents for hematologic evaluation to rule out polycythemia vera versus secondary causes.  Elevated hemoglobin was first found during a routine physical in October 2025, with repeat testing showing levels of 17.1-17.6 g/dL. Prior records show previously normal hemoglobin. He denies pruritus after hot showers, headaches, visual changes, dizziness, or symptoms of hyperviscosity. He denies abnormal bleeding or bruising.  He has obstructive sleep apnea diagnosed in 2008, managed with nightly CPAP without supplemental oxygen, with no recent changes in management. He denies chest pain, shortness of breath, cough, or hemoptysis. He has not used iron supplements, testosterone, or other hormonal therapies. Current medications include multivitamins, B12, vitamin D with calcium , and over-the-counter urinary flow agents.  He has diabetes complicated by right foot callus and recurrent cellulitis, with right second toe amputation in July 2025. In December 2025, he developed another foot infection requiring antibiotics, followed by a week of generalized weakness, which has since resolved. He denies current fever, chills, or night sweats.  He reports weight loss since his wife's passing in 2023, attributed to decreased appetite. He denies nausea, vomiting, diarrhea, or other gastrointestinal complaints.  There is no family history of blood disorders, leukemia, or hemochromatosis. Family  history is notable for bladder, kidney, and bone cancers.  The patient is a widow and has 1 daughter who lives with him.  He used to work in haematologist business in the parts department.  He has no history of smoking and drinks alcohol occasionally and no history of drug abuse.  Past Medical History:  Diagnosis Date   Arthritis    ankles, knees (01/29/2018)   CAD in native artery    cath report from 02/2017 indicates patient had a history of RCA stent >10 years prior to that. At time of that cath he was found to have EF 70%, 20-30% ostial LAD, 30-40% prox LAD, 30-40% Cx, 80-90% Cx beyond OM1, totally occluded RCA with left-to-right collaterals.   Cellulitis 03/03/2022   Diabetes mellitus type 2 in obese    GERD (gastroesophageal reflux disease)    Hyperlipidemia    Hypertension    Morbid obesity (HCC)    Onychomycosis 03/03/2022   OSA on CPAP       Past Surgical History:  Procedure Laterality Date   AMPUTATION Left 09/08/2022   Procedure: AMPUTATION LEFT 2nd TOE;  Surgeon: Silva Juliene SAUNDERS, DPM;  Location: [facility] OR;  Service: Podiatry;  Laterality: Left;   AMPUTATION TOE Right 10/28/2023   Procedure: AMPUTATION, TOE;  Surgeon: Malvin Marsa FALCON, DPM;  Location: [facility] OR;  Service: Orthopedics/Podiatry;  Laterality: Right;  RIGHT THIRD TOE AMPUTATION   CARDIAC CATHETERIZATION  2008   CORONARY ANGIOPLASTY WITH STENT PLACEMENT  1997   JOINT REPLACEMENT     KNEE ARTHROSCOPY Bilateral    meniscus repairs   TONSILLECTOMY     TOTAL KNEE ARTHROPLASTY Left 01/29/2018   TOTAL KNEE ARTHROPLASTY Left 01/29/2018   Procedure: LEFT TOTAL KNEE ARTHROPLASTY;  Surgeon: Jerri Kay HERO, MD;  Location: [facility] OR;  Service:  Orthopedics;  Laterality: Left;   UVULOPALATOPHARYNGOPLASTY  ~ 1994    Family History  Problem Relation Age of Onset   Heart attack Mother    Heart attack Father     Social History Social History[1]  Allergies[2]  Current Outpatient Medications  Medication Sig Dispense  Refill   acetaminophen  (TYLENOL ) 650 MG CR tablet Take 1,300 mg by mouth at bedtime.     aspirin  EC 325 MG tablet Take 325 mg by mouth at bedtime.     atorvastatin  (LIPITOR ) 80 MG tablet Take 80 mg by mouth at bedtime.      celecoxib  (CELEBREX ) 200 MG capsule TAKE 1 CAPSULE BY MOUTH EVERY DAY 30 capsule 1   Cholecalciferol (VITAMIN D-3 PO) Take 1 capsule by mouth daily.     colesevelam  (WELCHOL ) 625 MG tablet Take 1,875 mg by mouth at bedtime.     doxycycline  (VIBRA -TABS) 100 MG tablet Take 1 tablet (100 mg total) by mouth 2 (two) times daily. 20 tablet 0   empagliflozin  (JARDIANCE ) 25 MG TABS tablet TAKE 1 TABLET (25 MG TOTAL) BY MOUTH DAILY. 90 tablet 3   ezetimibe  (ZETIA ) 10 MG tablet TAKE 1 TABLET BY MOUTH EVERY DAY 90 tablet 3   isosorbide  mononitrate (IMDUR ) 120 MG 24 hr tablet TAKE 1 TABLET BY MOUTH EVERY DAY 90 tablet 3   metoprolol  succinate (TOPROL -XL) 100 MG 24 hr tablet TAKE 1 TABLET BY MOUTH EVERY DAY WITH OR IMMEDIATELY FOLLOWING A MEAL 90 tablet 3   Multiple Vitamins-Minerals (MULTIVITAMIN MEN 50+) TABS Take 1 tablet by mouth daily.     nitroGLYCERIN  (NITROSTAT ) 0.4 MG SL tablet Place 1 tablet (0.4 mg total) under the tongue every 5 (five) minutes x 3 doses as needed for chest pain. 25 tablet 3   omeprazole (PRILOSEC) 20 MG capsule Take 20 mg by mouth at bedtime.     OZEMPIC, 0.25 OR 0.5 MG/DOSE, 2 MG/3ML SOPN 0.25 mg Subcutaneous weekly; Duration: 30 days     Polyethyl Glycol-Propyl Glycol (SYSTANE OP) Place 1 drop into both eyes 2 (two) times daily.     pregabalin  (LYRICA ) 150 MG capsule Take 1 capsule (150 mg total) by mouth 2 (two) times daily. 180 capsule 0   senna-docusate (SENOKOT S) 8.6-50 MG tablet Take 1 tablet by mouth at bedtime as needed. 30 tablet 1   sodium chloride  (OCEAN) 0.65 % SOLN nasal spray Place 1-2 sprays into both nostrils every 4 (four) hours as needed (sinus irritation).     triamterene -hydrochlorothiazide  (MAXZIDE ) 75-50 MG per tablet Take 0.5 tablets by  mouth daily.      UNABLE TO FIND Inhale 1 Device into the lungs at bedtime. CPAP     No current facility-administered medications for this visit.    Review of Systems  Constitutional: negative Eyes: negative Ears, nose, mouth, throat, and face: negative Respiratory: negative Cardiovascular: negative Gastrointestinal: negative Genitourinary:negative Integument/breast: negative Hematologic/lymphatic: negative Musculoskeletal:negative Neurological: negative Behavioral/Psych: negative Endocrine: negative Allergic/Immunologic: negative  Physical Exam  MJO:jozmu, healthy, no distress, well nourished, and well developed SKIN: skin color, texture, turgor are normal, no rashes or significant lesions HEAD: Normocephalic, No masses, lesions, tenderness or abnormalities EYES: normal, PERRLA, Conjunctiva are pink and non-injected EARS: External ears normal, Canals clear OROPHARYNX:no exudate, no erythema, and lips, buccal mucosa, and tongue normal  NECK: supple, no adenopathy, no JVD LYMPH:  no palpable lymphadenopathy, no hepatosplenomegaly LUNGS: clear to auscultation , and palpation HEART: regular rate & rhythm, no murmurs, and no gallops ABDOMEN:abdomen soft, non-tender, normal bowel sounds,  and no masses or organomegaly BACK: Back symmetric, no curvature., No CVA tenderness EXTREMITIES:no joint deformities, effusion, or inflammation, no edema  NEURO: alert & oriented x 3 with fluent speech, no focal motor/sensory deficits  PERFORMANCE STATUS: ECOG 1  LABORATORY DATA: Lab Results  Component Value Date   WBC 4.6 04/21/2024   HGB 17.1 (H) 04/21/2024   HCT 50.9 04/21/2024   MCV 87.2 04/21/2024   PLT 124 (L) 04/21/2024      Chemistry      Component Value Date/Time   NA 136 04/21/2024 1106   K 4.3 04/21/2024 1106   CL 100 04/21/2024 1106   CO2 25 04/21/2024 1106   BUN 15 04/21/2024 1106   CREATININE 0.91 04/21/2024 1106   CREATININE 0.96 04/15/2017 0842      Component  Value Date/Time   CALCIUM  9.3 04/21/2024 1106   ALKPHOS 101 04/21/2024 1106   [name] 24 04/21/2024 1106   ALT 28 04/21/2024 1106   BILITOT 0.8 04/21/2024 1106       RADIOGRAPHIC STUDIES: DG Foot Complete Right Result Date: 04/01/2024 EXAM: 3 or more VIEW(S) XRAY OF THE FOOT 04/01/2024 06:16:59 PM COMPARISON: 10/28/2023 CLINICAL HISTORY: foot pain FINDINGS: BONES AND JOINTS: Status post third digit amputation. Pes planus. Plantar calcaneal spur. Stable midfoot degenerative change with findings suggestive of Charcot arthropathy. No acute fracture. No malalignment. SOFT TISSUES: Vascular calcifications. The soft tissues are unremarkable. IMPRESSION: 1. No acute findings. 2. Stable midfoot degenerative change with findings suggestive of Charcot arthropathy. 3. Status post third digit amputation. 4. Pes planus. Electronically signed by: Greig Pique MD 04/01/2024 06:55 PM EST RP Workstation: [id number]   DG Foot Complete Right Result Date: 04/01/2024 Please see detailed radiograph report in office note.   ASSESSMENT: This is a very pleasant 69 years old white male presented for evaluation of polycythemia likely reactive in nature secondary to obstructive sleep apnea but polycythemia vera could not be completely excluded at this point.   PLAN: I had a lengthy discussion with the patient today about his current condition and further investigation to identify the etiology of his polycythemia.  Assessment and Plan Assessment & Plan Evaluation for polycythemia vera Persistent elevation of hemoglobin and hematocrit since October 2025. Differential includes secondary erythrocytosis, such as from obstructive sleep apnea, and primary bone marrow disorders including polycythemia vera. He reports no symptoms of hyperviscosity or pruritus. Evaluation is ongoing to distinguish between reactive and primary etiologies. Polycythemia vera, if confirmed, is not a malignancy but carries a small risk of progression  to leukemia. - Ordered CBC, CMP, JAK2 mutation analysis, iron studies, and erythropoietin level to evaluate for polycythemia vera and secondary erythrocytosis. - Planned to review with the laboratory regarding the need for additional blood draw and to notify him if a return visit for labs is required. - Scheduled follow-up in one month to review results and determine further management. - Discussed that if polycythemia vera is confirmed, therapeutic phlebotomy and/or hydroxyurea may be considered.  Thrombocytopenia Chronic mild thrombocytopenia with platelet counts ranging from 114 to 124 x10^9/L. No bleeding symptoms or other cytopenias. Platelet count is monitored as part of ongoing hematologic evaluation. - Monitored platelet count with the CBC ordered for polycythemia vera evaluation. - Planned to reassess platelet count and hematologic status at next follow-up. He was advised to call immediately if he has any other concerning symptoms in the interval.   The patient voices understanding of current disease status and treatment options and is in agreement with the current care  plan.  All questions were answered. The patient knows to call the clinic with any problems, questions or concerns. We can certainly see the patient much sooner if necessary.  Thank you so much for allowing me to participate in the care of [name]. I will continue to follow up the patient with you and assist in his care.  The total time spent in the appointment was 60 minutes including review of chart and various tests results, discussions about plan of care and coordination of care plan .   Disclaimer: This note was dictated with voice recognition software. Similar sounding words can inadvertently be transcribed and may not be corrected upon review.   [name] April 21, 2024, 12:01 PM        [1]  Social History Tobacco Use   Smoking status: Never    Passive exposure: Never    Smokeless tobacco: Never  Vaping Use   Vaping status: Never Used  Substance Use Topics   Alcohol use: Yes    Comment: 01/29/2018 may have 1 beer/month; if that   Drug use: Never  [2]  Allergies Allergen Reactions   Glucophage [Metformin] Other (See Comments)    Unknown reaction   Mounjaro [Tirzepatide] Diarrhea and Other (See Comments)    Bloating  Sulfurous burps GI intolerance    Ozempic (0.25 Or 0.5 Mg-Dose) [Semaglutide(0.25 Or 0.5mg -Dos)] Diarrhea and Other (See Comments)    Bloating  Sulfurous burps GI intolerance    Penicillins Rash   "

## 2024-04-22 LAB — ERYTHROPOIETIN: Erythropoietin: 10.6 m[IU]/mL (ref 2.6–18.5)

## 2024-05-01 LAB — NGS JAK2 E12-15/CALR/MPL

## 2024-05-06 ENCOUNTER — Ambulatory Visit: Admitting: Podiatry

## 2024-05-06 DIAGNOSIS — M2141 Flat foot [pes planus] (acquired), right foot: Secondary | ICD-10-CM | POA: Diagnosis not present

## 2024-05-06 DIAGNOSIS — L84 Corns and callosities: Secondary | ICD-10-CM

## 2024-05-06 DIAGNOSIS — M2142 Flat foot [pes planus] (acquired), left foot: Secondary | ICD-10-CM | POA: Diagnosis not present

## 2024-05-06 NOTE — Progress Notes (Signed)
"  °  Chief Complaint  Patient presents with   Follow-up    Patient presents to office today for F/U on Cellulitis of right foot     69 y.o. male presents the office today with above concerns.  He states he has been doing well and denies any drainage or pus.  No increasing swelling or redness.  He denies any fevers or chills.  Patient manage surgical shoe on the right foot with the offloading pad.    Left foot is doing well without any skin breakdown.  No other concerns today.   Objective:    General: AAO x3, NAD  Dermatological: On plantar medial aspect the right foot > left is a hyperkeratotic lesion.  There are some dried blood present at the callus on the right foot but upon debridement there is no underlying ulceration, drainage or signs of infection.  Overall appears to be doing better and there is no further skin breakdown   Vascular: DP/PT pulses palpable.  There is no pain with calf compression, swelling, warmth, erythema.   Neruologic: Sensation decreased  Musculoskeletal: Significant flatfoot is present.  Hammertoes are present; proximal left third toe hammertoe previous partial second toe amputation  Gait: Unassisted, Nonantalgic.    Assessment:   Preulcerative lesion with cellulitis right foot  Plan:  Preulcerative calluses with resolved cellulitis -Currently there is no signs of infection we will hold further antibiotics.  Debrided hyperkeratotic lesion without any complication or bleeding.  Discussed moisturizer, offloading.  Recommended remaining in the surgical shoe with offloading pad on the right foot. -We discussed long-term management this with.  Shoes.  We also previously discussed surgical intervention if needed. -Monitor for any clinical signs or symptoms of infection and directed to call the office immediately should any occur or go to the ER.  Return in about 3 weeks (around 05/27/2024) for ulcer right foot.  [name] DPM "

## 2024-05-09 ENCOUNTER — Encounter: Payer: Self-pay | Admitting: Podiatry

## 2024-05-09 ENCOUNTER — Ambulatory Visit: Admitting: Podiatry

## 2024-05-10 ENCOUNTER — Other Ambulatory Visit: Payer: Self-pay | Admitting: Podiatry

## 2024-05-10 MED ORDER — PREGABALIN 150 MG PO CAPS: 150.0000 mg | ORAL_CAPSULE | Freq: Two times a day (BID) | ORAL | 0 refills | Status: AC

## 2024-05-26 ENCOUNTER — Inpatient Hospital Stay: Admitting: Internal Medicine

## 2024-05-26 ENCOUNTER — Inpatient Hospital Stay: Attending: Internal Medicine

## 2024-05-31 ENCOUNTER — Ambulatory Visit: Admitting: Podiatry
